# Patient Record
Sex: Male | Born: 1970 | Race: Black or African American | Hispanic: No | Marital: Married | Smoking: Never smoker
Health system: Southern US, Community
[De-identification: ages and names within clinical notes are randomized; demographics above are authoritative.]

## PROBLEM LIST (undated history)

## (undated) DIAGNOSIS — I10 Essential (primary) hypertension: Secondary | ICD-10-CM

## (undated) DIAGNOSIS — Z992 Dependence on renal dialysis: Secondary | ICD-10-CM

## (undated) DIAGNOSIS — I5020 Unspecified systolic (congestive) heart failure: Secondary | ICD-10-CM

## (undated) DIAGNOSIS — E119 Type 2 diabetes mellitus without complications: Secondary | ICD-10-CM

## (undated) DIAGNOSIS — N289 Disorder of kidney and ureter, unspecified: Secondary | ICD-10-CM

## (undated) DIAGNOSIS — I2510 Atherosclerotic heart disease of native coronary artery without angina pectoris: Secondary | ICD-10-CM

## (undated) DIAGNOSIS — E785 Hyperlipidemia, unspecified: Secondary | ICD-10-CM

## (undated) DIAGNOSIS — Z951 Presence of aortocoronary bypass graft: Secondary | ICD-10-CM

## (undated) DIAGNOSIS — N186 End stage renal disease: Secondary | ICD-10-CM

## (undated) HISTORY — PX: INSERTION OF DIALYSIS CATHETER: SHX1324

## (undated) HISTORY — PX: OTHER SURGICAL HISTORY: SHX169

## (undated) HISTORY — PX: AV FISTULA PLACEMENT: SHX1204

## (undated) HISTORY — PX: CARDIAC SURGERY: SHX584

---

## 2012-04-10 DIAGNOSIS — Z9889 Other specified postprocedural states: Secondary | ICD-10-CM

## 2012-04-10 HISTORY — PX: VITRECTOMY: SHX106

## 2012-04-10 HISTORY — DX: Other specified postprocedural states: Z98.890

## 2014-04-10 HISTORY — PX: CORONARY ARTERY BYPASS GRAFT: SHX141

## 2019-07-14 ENCOUNTER — Inpatient Hospital Stay (HOSPITAL_COMMUNITY)
Admission: EM | Admit: 2019-07-14 | Discharge: 2019-07-16 | DRG: 304 | Disposition: A | Discharge status: Home / Self Care | Payer: Medicare Other | Attending: Family Medicine | Admitting: Family Medicine

## 2019-07-14 ENCOUNTER — Emergency Department (HOSPITAL_COMMUNITY): Payer: Medicare Other

## 2019-07-14 ENCOUNTER — Other Ambulatory Visit: Payer: Self-pay

## 2019-07-14 ENCOUNTER — Encounter (HOSPITAL_COMMUNITY): Payer: Self-pay | Admitting: Emergency Medicine

## 2019-07-14 DIAGNOSIS — E785 Hyperlipidemia, unspecified: Secondary | ICD-10-CM | POA: Diagnosis present

## 2019-07-14 DIAGNOSIS — Z888 Allergy status to other drugs, medicaments and biological substances status: Secondary | ICD-10-CM | POA: Diagnosis not present

## 2019-07-14 DIAGNOSIS — Z79899 Other long term (current) drug therapy: Secondary | ICD-10-CM

## 2019-07-14 DIAGNOSIS — Z20822 Contact with and (suspected) exposure to covid-19: Secondary | ICD-10-CM | POA: Diagnosis present

## 2019-07-14 DIAGNOSIS — D631 Anemia in chronic kidney disease: Secondary | ICD-10-CM | POA: Diagnosis present

## 2019-07-14 DIAGNOSIS — I25110 Atherosclerotic heart disease of native coronary artery with unstable angina pectoris: Secondary | ICD-10-CM | POA: Diagnosis present

## 2019-07-14 DIAGNOSIS — Z794 Long term (current) use of insulin: Secondary | ICD-10-CM | POA: Diagnosis not present

## 2019-07-14 DIAGNOSIS — Z992 Dependence on renal dialysis: Secondary | ICD-10-CM | POA: Diagnosis not present

## 2019-07-14 DIAGNOSIS — I160 Hypertensive urgency: Secondary | ICD-10-CM | POA: Diagnosis present

## 2019-07-14 DIAGNOSIS — I120 Hypertensive chronic kidney disease with stage 5 chronic kidney disease or end stage renal disease: Secondary | ICD-10-CM | POA: Diagnosis present

## 2019-07-14 DIAGNOSIS — J9811 Atelectasis: Secondary | ICD-10-CM | POA: Diagnosis present

## 2019-07-14 DIAGNOSIS — E1122 Type 2 diabetes mellitus with diabetic chronic kidney disease: Secondary | ICD-10-CM | POA: Diagnosis present

## 2019-07-14 DIAGNOSIS — Z951 Presence of aortocoronary bypass graft: Secondary | ICD-10-CM

## 2019-07-14 DIAGNOSIS — N2581 Secondary hyperparathyroidism of renal origin: Secondary | ICD-10-CM | POA: Diagnosis present

## 2019-07-14 DIAGNOSIS — E8770 Fluid overload, unspecified: Secondary | ICD-10-CM | POA: Diagnosis present

## 2019-07-14 DIAGNOSIS — R778 Other specified abnormalities of plasma proteins: Secondary | ICD-10-CM | POA: Diagnosis present

## 2019-07-14 DIAGNOSIS — N186 End stage renal disease: Secondary | ICD-10-CM

## 2019-07-14 DIAGNOSIS — R079 Chest pain, unspecified: Secondary | ICD-10-CM

## 2019-07-14 DIAGNOSIS — R072 Precordial pain: Secondary | ICD-10-CM | POA: Diagnosis present

## 2019-07-14 HISTORY — DX: Disorder of kidney and ureter, unspecified: N28.9

## 2019-07-14 HISTORY — DX: Essential (primary) hypertension: I10

## 2019-07-14 HISTORY — DX: Type 2 diabetes mellitus without complications: E11.9

## 2019-07-14 LAB — PROTIME-INR
INR: 1.1 (ref 0.8–1.2)
Prothrombin Time: 14 seconds (ref 11.4–15.2)

## 2019-07-14 LAB — LIPID PANEL
Cholesterol: 134 mg/dL (ref 0–200)
HDL: 37 mg/dL — ABNORMAL LOW (ref >40)
LDL Cholesterol: 72 mg/dL (ref 0–99)
Total CHOL/HDL Ratio: 3.6 RATIO
Triglycerides: 127 mg/dL (ref <150)
VLDL: 25 mg/dL (ref 0–40)

## 2019-07-14 LAB — GLUCOSE, CAPILLARY: Glucose-Capillary: 133 mg/dL — ABNORMAL HIGH (ref 70–99)

## 2019-07-14 LAB — BASIC METABOLIC PANEL
Anion gap: 20 — ABNORMAL HIGH (ref 5–15)
BUN: 98 mg/dL — ABNORMAL HIGH (ref 6–20)
CO2: 22 mmol/L (ref 22–32)
Calcium: 8.6 mg/dL — ABNORMAL LOW (ref 8.9–10.3)
Chloride: 94 mmol/L — ABNORMAL LOW (ref 98–111)
Creatinine, Ser: 21.27 mg/dL — ABNORMAL HIGH (ref 0.61–1.24)
GFR calc Af Amer: 3 mL/min — ABNORMAL LOW (ref >60)
GFR calc non Af Amer: 2 mL/min — ABNORMAL LOW (ref >60)
Glucose, Bld: 112 mg/dL — ABNORMAL HIGH (ref 70–99)
Potassium: 4.2 mmol/L (ref 3.5–5.1)
Sodium: 136 mmol/L (ref 135–145)

## 2019-07-14 LAB — CBC
HCT: 29.9 % — ABNORMAL LOW (ref 39.0–52.0)
Hemoglobin: 9.9 g/dL — ABNORMAL LOW (ref 13.0–17.0)
MCH: 29.6 pg (ref 26.0–34.0)
MCHC: 33.1 g/dL (ref 30.0–36.0)
MCV: 89.3 fL (ref 80.0–100.0)
Platelets: 227 10*3/uL (ref 150–400)
RBC: 3.35 MIL/uL — ABNORMAL LOW (ref 4.22–5.81)
RDW: 16.2 % — ABNORMAL HIGH (ref 11.5–15.5)
WBC: 5.3 10*3/uL (ref 4.0–10.5)
nRBC: 0 % (ref 0.0–0.2)

## 2019-07-14 LAB — TROPONIN I (HIGH SENSITIVITY)
Troponin I (High Sensitivity): 113 ng/L (ref <18)
Troponin I (High Sensitivity): 98 ng/L — ABNORMAL HIGH (ref <18)

## 2019-07-14 LAB — RESPIRATORY PANEL BY RT PCR (FLU A&B, COVID)
Influenza A by PCR: NEGATIVE
Influenza B by PCR: NEGATIVE
SARS Coronavirus 2 by RT PCR: NEGATIVE

## 2019-07-14 MED ORDER — CINACALCET HCL 30 MG PO TABS
30.0000 mg | ORAL_TABLET | Freq: Every day | ORAL | Status: DC
  Administered 2019-07-15 – 2019-07-16 (×2): 30 mg via ORAL
  Filled 2019-07-14 (×3): qty 1

## 2019-07-14 MED ORDER — CARVEDILOL 12.5 MG PO TABS
12.5000 mg | ORAL_TABLET | Freq: Two times a day (BID) | ORAL | Status: DC
  Administered 2019-07-14 – 2019-07-16 (×4): 12.5 mg via ORAL
  Filled 2019-07-14 (×5): qty 1

## 2019-07-14 MED ORDER — NIFEDIPINE ER OSMOTIC RELEASE 60 MG PO TB24
60.0000 mg | ORAL_TABLET | Freq: Every day | ORAL | Status: DC
  Administered 2019-07-14 – 2019-07-16 (×3): 60 mg via ORAL
  Filled 2019-07-14 (×3): qty 1

## 2019-07-14 MED ORDER — FUROSEMIDE 80 MG PO TABS
80.0000 mg | ORAL_TABLET | Freq: Two times a day (BID) | ORAL | Status: DC
  Administered 2019-07-15 – 2019-07-16 (×3): 80 mg via ORAL
  Filled 2019-07-14 (×3): qty 1

## 2019-07-14 MED ORDER — SODIUM CHLORIDE 0.9% FLUSH: 3.0000 mL | Freq: Once | INTRAVENOUS | Status: DC

## 2019-07-14 MED ORDER — ROSUVASTATIN CALCIUM 20 MG PO TABS
40.0000 mg | ORAL_TABLET | Freq: Every day | ORAL | Status: DC
  Administered 2019-07-14 – 2019-07-16 (×3): 40 mg via ORAL
  Filled 2019-07-14 (×3): qty 2

## 2019-07-14 MED ORDER — INSULIN ASPART 100 UNIT/ML ~~LOC~~ SOLN: 0.0000 [IU] | Freq: Every day | SUBCUTANEOUS | Status: DC

## 2019-07-14 MED ORDER — ACETAMINOPHEN 325 MG PO TABS: 650.0000 mg | ORAL_TABLET | ORAL | Status: DC | PRN

## 2019-07-14 MED ORDER — SUCROFERRIC OXYHYDROXIDE 500 MG PO CHEW
500.0000 mg | CHEWABLE_TABLET | Freq: Three times a day (TID) | ORAL | Status: DC
  Filled 2019-07-14 (×2): qty 1

## 2019-07-14 MED ORDER — HEPARIN (PORCINE) 25000 UT/250ML-% IV SOLN
1600.0000 [IU]/h | INTRAVENOUS | Status: DC
  Administered 2019-07-14 – 2019-07-15 (×2): 1500 [IU]/h via INTRAVENOUS
  Administered 2019-07-16: 1600 [IU]/h via INTRAVENOUS
  Filled 2019-07-14 (×4): qty 250

## 2019-07-14 MED ORDER — SENNA 8.6 MG PO TABS
2.0000 | ORAL_TABLET | Freq: Every day | ORAL | Status: DC
  Administered 2019-07-15 – 2019-07-16 (×2): 17.2 mg via ORAL
  Filled 2019-07-14 (×2): qty 2

## 2019-07-14 MED ORDER — HEPARIN 1000 UNIT/ML FOR PERITONEAL DIALYSIS: INTRAPERITONEAL | Status: DC | PRN

## 2019-07-14 MED ORDER — EXTRANEAL 7.5 % IP SOLN
INTRAPERITONEAL | Status: DC
  Filled 2019-07-14 (×3): qty 2500

## 2019-07-14 MED ORDER — PARICALCITOL 1 MCG PO CAPS
3.0000 ug | ORAL_CAPSULE | Freq: Every day | ORAL | Status: DC
  Administered 2019-07-15 – 2019-07-16 (×2): 3 ug via ORAL
  Filled 2019-07-14 (×2): qty 3

## 2019-07-14 MED ORDER — LABETALOL HCL 5 MG/ML IV SOLN
10.0000 mg | Freq: Once | INTRAVENOUS | Status: AC
  Administered 2019-07-14: 20:00:00 10 mg via INTRAVENOUS
  Filled 2019-07-14: qty 4

## 2019-07-14 MED ORDER — HEPARIN BOLUS VIA INFUSION
4000.0000 [IU] | Freq: Once | INTRAVENOUS | Status: AC
  Administered 2019-07-14: 4000 [IU] via INTRAVENOUS
  Filled 2019-07-14: qty 4000

## 2019-07-14 MED ORDER — INSULIN ASPART 100 UNIT/ML ~~LOC~~ SOLN: 0.0000 [IU] | Freq: Three times a day (TID) | SUBCUTANEOUS | Status: DC

## 2019-07-14 MED ORDER — GENTAMICIN SULFATE 0.1 % EX CREA
1.0000 "application " | TOPICAL_CREAM | Freq: Every day | CUTANEOUS | Status: DC
  Administered 2019-07-16: 1 via TOPICAL
  Filled 2019-07-14: qty 15

## 2019-07-14 MED ORDER — ONDANSETRON HCL 4 MG/2ML IJ SOLN: 4.0000 mg | Freq: Four times a day (QID) | INTRAMUSCULAR | Status: DC | PRN

## 2019-07-14 MED ORDER — DELFLEX-LC/2.5% DEXTROSE 394 MOSM/L IP SOLN: INTRAPERITONEAL | Status: DC

## 2019-07-14 MED ORDER — HEPARIN 1000 UNIT/ML FOR PERITONEAL DIALYSIS: 500.0000 [IU] | INTRAMUSCULAR | Status: DC | PRN

## 2019-07-14 NOTE — Progress Notes (Signed)
Patient reportedly refused heparin drip be started at [facility] and continued refusing upon arrival to [place]. Consulting Cardiology MD Dr Simone Curia present on the unit  made aware  Patient finally agreeable to the infusion after talking to the physician. Elita Boone, ESN

## 2019-07-14 NOTE — Consult Note (Signed)
Reason for Consult: Continuity of ESRD care Referring Physician: Marva Panda, MD [facility])  HPI:  49 year old African-American man with past medical history significant for hypertension, type 2 diabetes mellitus, coronary artery disease status post CABG in 2016 and end-stage renal disease on peritoneal dialysis (CCPD).  He reports that he is in the process of moving from [place] to [place] and outpatient arrangements for continuity of peritoneal dialysis are underway.  He unfortunately misplaced/lost his medications in the moved here unfortunately has not taken any of his medications in the last 5 days.  Concern was raised 3 days ago when he  developed chest pain that has not resolved and is not associated diaphoresis, cough or sputum production.  He denies any nausea, vomiting or diarrhea.  Dialysis prescription (per patient): [facility]CPD, 2.6 L fill volume, 5 exchanges, 11 hours, Daytime dwell with icodextrin  Past Medical History:  Diagnosis Date  . Diabetes mellitus without complication ([place])   . Hypertension   . Renal disorder     Past Surgical History:  Procedure Laterality Date  . AV FISTULA PLACEMENT    . CARDIAC SURGERY    . CORONARY ARTERY BYPASS GRAFT  2016  . INSERTION OF DIALYSIS CATHETER      History reviewed. No pertinent family history.  Social History:  reports that he has never smoked. He has never used smokeless tobacco. He reports current alcohol use. No history on file for drug.  Allergies:  Allergies  Allergen Reactions  . Lisinopril Hives    Medications:  Scheduled: . carvedilol  12.5 mg Oral BID WC  . heparin  4,000 Units Intravenous Once  . NIFEdipine  60 mg Oral Daily  . rosuvastatin  40 mg Oral Daily  . sodium chloride flush  3 mL Intravenous Once   BMP Latest Ref Rng & Units 07/14/2019  Glucose 70 - 99 mg/dL 112(H)  BUN 6 - 20 mg/dL 98(H)  Creatinine 0.61 - 1.24 mg/dL 21.27(H)  Sodium 135 - 145 mmol/L 136  Potassium 3.5 - 5.1 mmol/L 4.2   Chloride 98 - 111 mmol/L 94(L)  CO2 22 - 32 mmol/L 22  Calcium 8.9 - 10.3 mg/dL 8.6(L)   CBC Latest Ref Rng & Units 07/14/2019  WBC 4.0 - 10.5 K/uL 5.3  Hemoglobin 13.0 - 17.0 g/dL 9.9(L)  Hematocrit 39.0 - 52.0 % 29.9(L)  Platelets 150 - 400 K/uL 227     DG Chest 2 View  Result Date: 07/14/2019 CLINICAL DATA:  Chest pain and shortness of breath EXAM: CHEST - 2 VIEW COMPARISON:  None. FINDINGS: There is mild cardiomegaly. Overlying median sternotomy wires are present. There is prominence of the central pulmonary vasculature. Hazy airspace opacity seen at the right lung base. No pleural effusion. No acute osseous abnormality. Overlying vascular stent seen within the region of the left subclavian. IMPRESSION: Cardiomegaly and pulmonary vascular congestion. Hazy airspace opacity at the right lung base which may be due to atelectasis, or asymmetric edema Electronically Signed   By: Prudencio Pair M.D.   On: 07/14/2019 15:37    Review of Systems Blood pressure (!) 227/125, pulse 87, temperature 98.6 F (37 C), temperature source Oral, resp. rate 16, height 5\' 10"  (1.778 m), weight 113.4 kg, SpO2 93 %. Physical Exam  Nursing note and vitals reviewed. Constitutional: He is oriented to person, place, and time. He appears well-developed and well-nourished. No distress.  HENT:  Head: Normocephalic and atraumatic.  Nose: Nose normal.  Mouth/Throat: Oropharynx is clear and moist.  Eyes: Pupils are equal,  round, and reactive to light. Conjunctivae and EOM are normal. No scleral icterus.  Neck: No JVD present.  Cardiovascular: Normal rate, regular rhythm and normal heart sounds.  No murmur heard. Respiratory: Effort normal and breath sounds normal. He has no wheezes. He has no rales.  GI: Soft. Bowel sounds are normal. There is no abdominal tenderness. There is no rebound and no guarding.  Peritoneal dialysis catheter in situ  Musculoskeletal:        General: No edema.     Cervical back: Normal  range of motion and neck supple.     Comments: LUA AVF  Neurological: He is alert and oriented to person, place, and time.  Skin: Skin is warm and dry. No rash noted. No erythema.   Assessment/Plan: 1.  Malignant hypertension: Resume oral antihypertensive therapy with as needed intravenous labetalol. 2.  Chest pain: Currently cycling cardiac enzymes to evaluate for acute coronary syndrome given background history of CABG.  Per cardiology, likely to undergo coronary angiography tomorrow. 3.  End-stage renal disease: We will resume peritoneal dialysis upon transfer to [facility].  Agree with restarting furosemide for volume management.  Dialysis navigator will need to verify his outpatient continuity of peritoneal dialysis. 4.  Anemia of chronic kidney disease: Without overt loss, borderline low hemoglobin and hematocrit.  Will check iron studies and resume ESA. 5.  Secondary hyperparathyroidism: Resume renal diet and phosphorus binders and reconcile medications for vitamin D receptor analog.   Lowell Mcgurk K. 07/14/2019, 8:03 PM

## 2019-07-14 NOTE — ED Notes (Signed)
carelink bedside

## 2019-07-14 NOTE — ED Notes (Signed)
Date and time results received: 07/14/19 4:47 PM  (use smartphrase ".now" to insert current time)  Test: Trop Critical Value: 113  Name of Provider Notified: [place]  Orders Received? Or Actions Taken?: Orders Received - See Orders for details

## 2019-07-14 NOTE — ED Triage Notes (Signed)
Pt reports that he has been moving boxes and furniture where he moved from [place] to [place]. Reports had chest discomfort and SOb over the past 3 days with exertion. hasnt had HTN meds in 3 days.

## 2019-07-14 NOTE — ED Notes (Signed)
Dr. Posey Pronto aware patients BP 202/110, new orders for labetalol 10 mg.

## 2019-07-14 NOTE — H&P (Addendum)
History and Physical        Hospital Admission Note Date: 07/14/2019  Patient name: Donald [name] Medical record number: [id number] Date of birth: 1970/09/11 Age: 49 y.o. Gender: male  PCP: Patient, [name]  Patient coming from: Home Lives with: Wife At baseline, ambulates: Independently  Chief Complaint    Chief Complaint  Patient presents with  . Chest Pain  . Shortness of Breath      HPI:   This is a 49 year old male with past medical history of CABG in 2016, ESRD on PD, type 2 diabetes, hypertension who presents to [facility] on 07/14/19 for persistent chest pain x3 to 4 days.  Patient recently moved from [place] to [place] and fortunately lost his medications in the move and so has been without his blood pressure meds x5 days about.  3 days ago started having typical chest pain symptoms which were not similar to his symptoms of CABG as he did not have chest pain at that time.  Notable ED labs: BUN/creatinine 98/21, calcium 8.6, anion gap 20, hemoglobin 9.9, HS troponin I 113 ED treatments: None ED imaging: CXR: Cardiomegaly and pulmonary vascular congestion, hazy airspace opacity at right lung base atelectasis or asymmetric edema ED vitals:  Vitals:   07/14/19 1715 07/14/19 1745  BP: (!) 212/110 (!) 223/129  Pulse: 82 84  Resp: (!) 23 (!) 25  Temp:    SpO2: (!) 89% (!) 86%     Review of Systems:  Review of Systems  Constitutional: Negative for chills and fever.  Eyes: Negative for blurred vision and double vision.  Respiratory: Positive for cough. Negative for shortness of breath.   Cardiovascular: Positive for chest pain. Negative for palpitations and leg swelling.  Gastrointestinal: Positive for nausea. Negative for vomiting.  Genitourinary: Negative.   Musculoskeletal: Negative.  Negative for myalgias.  Neurological: Negative for dizziness and  headaches.  Endo/Heme/Allergies: Negative.   All other systems reviewed and are negative.   Medical/Social/Family History   Past Medical History: Past Medical History:  Diagnosis Date  . Diabetes mellitus without complication ([place])   . Hypertension   . Renal disorder     Past Surgical History:  Procedure Laterality Date  . AV FISTULA PLACEMENT    . CARDIAC SURGERY    . CORONARY ARTERY BYPASS GRAFT  2016  . INSERTION OF DIALYSIS CATHETER      Medications: Prior to Admission medications   Medication Sig Start Date End Date Taking? Authorizing Provider  carvedilol (COREG) 12.5 MG tablet Take 12.5 mg by mouth 2 (two) times daily with a meal.   Yes [provider]  cinacalcet (SENSIPAR) 30 MG tablet Take 30 mg by mouth daily.   Yes [provider]  furosemide (LASIX) 80 MG tablet Take 80 mg by mouth 2 (two) times daily.   Yes [provider]  NIFEdipine (PROCARDIA XL/NIFEDICAL XL) 60 MG 24 hr tablet Take 60 mg by mouth in the morning and at bedtime.   Yes [provider]  paricalcitol (ZEMPLAR) 1 MCG capsule Take 3 mcg by mouth daily.   Yes [provider]  rosuvastatin (CRESTOR) 40 MG tablet Take 40 mg by mouth daily.   Yes [provider]  senna (SENOKOT) 8.6 MG tablet Take 2 tablets by mouth daily.   Yes [provider]  sucroferric oxyhydroxide (VELPHORO) 500 MG chewable tablet Chew 500 mg by mouth 3 (three) times daily with meals.   Yes [provider]    Allergies:   Allergies  Allergen Reactions  . Lisinopril Hives    Social History:  reports that he has never smoked. He has never used smokeless tobacco. He reports current alcohol use. No history on file for drug.  Family History: History reviewed. No pertinent family history.   Objective   Physical Exam: Blood pressure (!) 223/129, pulse 84, temperature 98.6 F (37 C), temperature source Oral, resp. rate (!) 25, SpO2 (!) 86 %.  Physical  Exam Vitals and nursing note reviewed.  Constitutional:      Comments: Appears uncomfortable  HENT:     Head: Normocephalic.  Cardiovascular:     Rate and Rhythm: Normal rate and regular rhythm.     Heart sounds: Normal heart sounds. No murmur.     Comments: Telemetry with ST depressions and occasional PVCs Pulmonary:     Effort: Pulmonary effort is normal. No tachypnea.     Breath sounds: No wheezing.  Abdominal:     Palpations: Abdomen is soft.     Tenderness: There is no abdominal tenderness.  Musculoskeletal:     Right lower leg: No tenderness.     Left lower leg: No tenderness.     Comments: Trace lower extremity edema bilaterally   Skin:    Comments: Chronic skin changes of bilateral lower extremities  Neurological:     General: No focal deficit present.     Mental Status: He is alert.  Psychiatric:        Mood and Affect: Mood normal.        Behavior: Behavior normal.     LABS on Admission: I have personally reviewed all the labs and imaging below    Basic Metabolic Panel: Recent Labs  Lab 07/14/19 1518  NA 136  K 4.2  CL 94*  CO2 22  GLUCOSE 112*  BUN 98*  CREATININE 21.27*  CALCIUM 8.6*   Liver Function Tests: No results for input(s): AST, ALT, ALKPHOS, BILITOT, PROT, ALBUMIN in the last 168 hours. No results for input(s): LIPASE, AMYLASE in the last 168 hours. No results for input(s): AMMONIA in the last 168 hours. CBC: Recent Labs  Lab 07/14/19 1518  WBC 5.3  HGB 9.9*  HCT 29.9*  MCV 89.3  PLT 227   Cardiac Enzymes: No results for input(s): CKTOTAL, CKMB, CKMBINDEX, TROPONINI in the last 168 hours. BNP: Invalid input(s): POCBNP CBG: No results for input(s): GLUCAP in the last 168 hours.  Radiological Exams on Admission:  DG Chest 2 View  Result Date: 07/14/2019 CLINICAL DATA:  Chest pain and shortness of breath EXAM: CHEST - 2 VIEW COMPARISON:  None. FINDINGS: There is mild cardiomegaly. Overlying median sternotomy wires are present.  There is prominence of the central pulmonary vasculature. Hazy airspace opacity seen at the right lung base. No pleural effusion. No acute osseous abnormality. Overlying vascular stent seen within the region of the left subclavian. IMPRESSION: Cardiomegaly and pulmonary vascular congestion. Hazy airspace opacity at the right lung base which may be due to atelectasis, or asymmetric edema Electronically Signed   By: Prudencio Pair M.D.   On: 07/14/2019 15:37      EKG: Independently reviewed.  ST depressions of lateral leads and QT prolongation   A &  P   Active Problems:   Hypertensive urgency   1. Hypertensive urgency a. Patient's home medications were lost in his recent move from [place] to [place] has been without meds x5 days b. Continue telemetry c. Restart home antihypertensives  2. Typical chest pain with history of CABG a. Initial troponin elevated at 113 in the setting of CKD b. Follow-up repeat troponin c. Continue telemetry d. Echo e. Restart home beta-blocker and statin f. Start on heparin per pharmacy g. Cardiology consulted: Recommended transfer to [facility] for likely cath tomorrow  3. ESRD on PD a. Consulted nephrology b. Restart home lasix c. Restarted home medications  4. Type 2 diabetes a. Stated he takes NovoLog 6 units 3 times daily with meals and was unsure of his long-acting dose b. Start on renal sliding scale c. Diabetic coordinator consult to assist with meds at discharge  5. Hyperlipidemia  a. Continue home statin b. Check lipid panel   DVT prophylaxis: Heparin   Code Status: Not on file  Diet: Renal/carb modified, n.p.o. after midnight Family Communication: Admission, patients condition and plan of care including tests being ordered have been discussed with the patient who indicates understanding and agrees with the plan and Code Status. Patient's wife at bedside was updated  Disposition Plan: The appropriate patient status for this patient is INPATIENT.  Inpatient status is judged to be reasonable and necessary in order to provide the required intensity of service to ensure the patient's safety. The patient's presenting symptoms, physical exam findings, and initial radiographic and laboratory data in the context of their chronic comorbidities is felt to place them at high risk for further clinical deterioration. Furthermore, it is not anticipated that the patient will be medically stable for discharge from the hospital within 2 midnights of admission. The following factors support the patient status of inpatient.   " The patient's presenting symptoms include chest pain. " The worrisome physical exam findings include hypertensive urgency, typical chest pain. " The initial radiographic and laboratory data are worrisome because of CXR with volume overload, elevated troponin, ST depressions on telemetry. " The chronic co-morbidities include CABG, ESRD on PD, hypertension, hyperlipidemia.   * I certify that at the point of admission it is my clinical judgment that the patient will require inpatient hospital care spanning beyond 2 midnights from the point of admission due to high intensity of service, high risk for further deterioration and high frequency of surveillance required.*    The medical decision making on this patient was of high complexity and the patient is at high risk for clinical deterioration, therefore this is a level 3 admission.  Consultants  . Nephrology . Cardiology  Procedures  . None  Time Spent on Admission: 75 minutes    Harold Hedge, DO [facility] Pager [phone] 07/14/2019, 6:05 PM

## 2019-07-14 NOTE — ED Notes (Signed)
Dr. Francia Greaves aware patients BP 200/100

## 2019-07-14 NOTE — ED Notes (Signed)
Patient informed RN that he could not take heparin, a previous MD of his care told him not to due to his eyes. Dr. Neysa Bonito made aware.

## 2019-07-14 NOTE — Consult Note (Signed)
[facility] Consult Note   Primary Physician:  Patient, [name] Primary Cardiologist:   None  Reason for Consultation:  Chest pain  HPI:    Donald [name] is a 49 year old male with a past medical history significant for coronary artery disease (s/p 3-vessel CABG in 2016), hypertension, ESRD (on peritoneal dialysis), and diabetes mellitus who presents to the hospital with complaints of chest pain. The chest pain is described as substernal, pressure-like, does not radiate, moderate in intensity and has been occurring intermittently for the past 3 days. He did not have any associated diaphoresis, nausea or vomiting.  He does admit that his pain is worse on exertion.  He recently moved to [place] from [place].  He has not established care locally. He also ran out of all of his medications recently. He has also had some associated shortness of breath.   In the emergency department his blood pressure was as high as 227/125 mmHg with a heart rate of 87 bpm.  Respiratory rate was 16 with an O2 saturation of 93%.  His labs were as follows: Potassium 4.2, BUN 98, creatinine 21.27, WBC 5.3, hemoglobin 9.9, hematocrit 29.9 and platelets 227.  The high-sensitivity troponins were 113 and 98. The chest x-ray showed pulmonary vascular congestion and mild cardiomegaly. The electrocardiogram showed normal sinus rhythm, ventricular rate 84 bpm, LVH with repolarization abnormalities and a QTC of 502 ms.    Home Medications Prior to Admission medications   Medication Sig Start Date End Date Taking? Authorizing Provider  carvedilol (COREG) 12.5 MG tablet Take 12.5 mg by mouth 2 (two) times daily with a meal.   Yes [provider]  cinacalcet (SENSIPAR) 30 MG tablet Take 30 mg by mouth daily.   Yes [provider]  furosemide (LASIX) 80 MG tablet Take 80 mg by mouth 2 (two) times daily.   Yes [provider]  NIFEdipine (PROCARDIA XL/NIFEDICAL XL) 60 MG 24 hr tablet  Take 60 mg by mouth in the morning and at bedtime.   Yes [provider]  paricalcitol (ZEMPLAR) 1 MCG capsule Take 3 mcg by mouth daily.   Yes [provider]  rosuvastatin (CRESTOR) 40 MG tablet Take 40 mg by mouth daily.   Yes [provider]  senna (SENOKOT) 8.6 MG tablet Take 2 tablets by mouth daily.   Yes [provider]  sucroferric oxyhydroxide (VELPHORO) 500 MG chewable tablet Chew 500 mg by mouth 3 (three) times daily with meals.   Yes [provider]    Past Medical History: Past Medical History:  Diagnosis Date  . Diabetes mellitus without complication ([place])   . Hypertension   . Renal disorder     Past Surgical History: Past Surgical History:  Procedure Laterality Date  . AV FISTULA PLACEMENT    . CARDIAC SURGERY    . CORONARY ARTERY BYPASS GRAFT  2016  . INSERTION OF DIALYSIS CATHETER      Family History: History reviewed. No pertinent family history.  Social History: Social History   Socioeconomic History  . Marital status: Married    Spouse name: Not on file  . Number of children: Not on file  . Years of education: Not on file  . Highest education level: Not on file  Occupational History  . Not on file  Tobacco Use  . Smoking status: Never Smoker  . Smokeless tobacco: Never Used  Substance and Sexual Activity  . Alcohol use: Yes    Comment: occasional   .  Drug use: Not on file  . Sexual activity: Not on file  Other Topics Concern  . Not on file  Social History Narrative  . Not on file   Social Determinants of Health   Financial Resource Strain:   . Difficulty of Paying Living Expenses:   Food Insecurity:   . Worried About Charity fundraiser in the Last Year:   . Arboriculturist in the Last Year:   Transportation Needs:   . Film/video editor (Medical):   [name] Lack of Transportation (Non-Medical):   Physical Activity:   . Days of Exercise per Week:   . Minutes of Exercise per Session:     Stress:   . Feeling of Stress :   Social Connections:   . Frequency of Communication with Friends and Family:   . Frequency of Social Gatherings with Friends and Family:   . Attends Religious Services:   . Active Member of Clubs or Organizations:   . Attends Archivist Meetings:   [name] Marital Status:     Allergies:  Allergies  Allergen Reactions  . Lisinopril Hives     Review of Systems: [y] = yes, [ ]  = no   . General: Weight gain [ ] ; Weight loss [ ] ; Anorexia [ ] ; Fatigue [ ] ; Fever [ ] ; Chills [ ] ; Weakness [ ]   . Cardiac: Chest pain/pressure [Y]; Resting SOB [ ] ; Exertional SOB [ ] ; Orthopnea [ ] ; Pedal Edema [ ] ; Palpitations [ ] ; Syncope [ ] ; Presyncope [ ] ; Paroxysmal nocturnal dyspnea[ ]   . Pulmonary: Cough [Y]; Wheezing[ ] ; Hemoptysis[ ] ; Sputum [ ] ; Snoring [ ]   . GI: Vomiting[ ] ; Dysphagia[ ] ; Melena[ ] ; Hematochezia [ ] ; Heartburn[ ] ; Abdominal pain [ ] ; Constipation [ ] ; Diarrhea [ ] ; BRBPR [ ]   . GU: Hematuria[ ] ; Dysuria [ ] ; Nocturia[ ]   . Vascular: Pain in legs with walking [ ] ; Pain in feet with lying flat [ ] ; Non-healing sores [ ] ; Stroke [ ] ; TIA [ ] ; Slurred speech [ ] ;  . Neuro: Headaches[ ] ; Vertigo[ ] ; Seizures[ ] ; Paresthesias[ ] ;Blurred vision [ ] ; Diplopia [ ] ; Vision changes [ ]   . Ortho/Skin: Arthritis [ ] ; Joint pain [ ] ; Muscle pain [ ] ; Joint swelling [ ] ; Back Pain [ ] ; Rash [ ]   . Psych: Depression[ ] ; Anxiety[ ]   . Heme: Bleeding problems [ ] ; Clotting disorders [ ] ; Anemia [ ]   . Endocrine: Diabetes [ ] ; Thyroid dysfunction[ ]      Objective:    Vital Signs:   Temp:  [98.4 F (36.9 C)-98.6 F (37 C)] 98.4 F (36.9 C) (04/05 2110) Pulse Rate:  [75-106] 75 (04/05 2110) Resp:  [16-31] 23 (04/05 2000) BP: (150-227)/(75-140) 150/75 (04/05 2110) SpO2:  [89 %-100 %] 100 % (04/05 2110) Weight:  [113.4 kg] 113.4 kg (04/05 1806)    Weight change: Filed Weights   07/14/19 1806  Weight: 113.4 kg    Intake/Output:  No intake or  output data in the 24 hours ending 07/14/19 2317    Physical Exam    General:  Well appearing. No resp difficulty HEENT: normal Neck: supple. JVP . Carotids 2+ bilat; no bruits. No lymphadenopathy or thyromegaly appreciated. Cor: PMI nondisplaced. Regular rate & rhythm. No rubs, gallops or murmurs. Lungs: clear to auscultation bilaterally Abdomen: soft, nontender, nondistended. No hepatosplenomegaly. No bruits or masses.  Extremities: no cyanosis, clubbing, rash, edema Neuro: alert & orientedx3, cranial nerves grossly intact. moves all 4  extremities w/o difficulty. Affect pleasant    EKG    The electrocardiogram (that I reviewed personally) showed normal sinus rhythm, ventricular rate 84 bpm, LVH with repolarization abnormalities and a QTC of 502 ms  Labs   Basic Metabolic Panel: Recent Labs  Lab 07/14/19 1518  NA 136  K 4.2  CL 94*  CO2 22  GLUCOSE 112*  BUN 98*  CREATININE 21.27*  CALCIUM 8.6*    Liver Function Tests: No results for input(s): AST, ALT, ALKPHOS, BILITOT, PROT, ALBUMIN in the last 168 hours. No results for input(s): LIPASE, AMYLASE in the last 168 hours. No results for input(s): AMMONIA in the last 168 hours.  CBC: Recent Labs  Lab 07/14/19 1518  WBC 5.3  HGB 9.9*  HCT 29.9*  MCV 89.3  PLT 227    Cardiac Enzymes: No results for input(s): CKTOTAL, CKMB, CKMBINDEX, TROPONINI in the last 168 hours.  BNP: BNP (last 3 results) No results for input(s): BNP in the last 8760 hours.  ProBNP (last 3 results) No results for input(s): PROBNP in the last 8760 hours.   CBG: Recent Labs  Lab 07/14/19 2222  GLUCAP 133*    Coagulation Studies: Recent Labs    07/14/19 1518  LABPROT 14.0  INR 1.1     Imaging   DG Chest 2 View  Result Date: 07/14/2019 CLINICAL DATA:  Chest pain and shortness of breath EXAM: CHEST - 2 VIEW COMPARISON:  None. FINDINGS: There is mild cardiomegaly. Overlying median sternotomy wires are present. There is  prominence of the central pulmonary vasculature. Hazy airspace opacity seen at the right lung base. No pleural effusion. No acute osseous abnormality. Overlying vascular stent seen within the region of the left subclavian. IMPRESSION: Cardiomegaly and pulmonary vascular congestion. Hazy airspace opacity at the right lung base which may be due to atelectasis, or asymmetric edema Electronically Signed   By: Prudencio Pair M.D.   On: 07/14/2019 15:37      Medications:     Current Medications: . carvedilol  12.5 mg Oral BID WC  . [START ON 07/15/2019] cinacalcet  30 mg Oral Q breakfast  . extraneal (ICODEXTRIN) peritoneal dialysis solution   Intraperitoneal Q24H  . [START ON 07/15/2019] furosemide  80 mg Oral BID  . [START ON 07/15/2019] gentamicin cream  1 application Topical Daily  . heparin  4,000 Units Intravenous Once  . insulin aspart  0-5 Units Subcutaneous QHS  . [START ON 07/15/2019] insulin aspart  0-6 Units Subcutaneous TID WC  . NIFEdipine  60 mg Oral Daily  . [START ON 07/15/2019] paricalcitol  3 mcg Oral Daily  . rosuvastatin  40 mg Oral Daily  . [START ON 07/15/2019] senna  2 tablet Oral Daily  . sodium chloride flush  3 mL Intravenous Once  . [START ON 07/15/2019] sucroferric oxyhydroxide  500 mg Oral TID WC     Infusions: . dialysis solution 2.5% low-MG/low-CA    . heparin Stopped (07/14/19 1840)       Assessment/Plan   1.  Chest pain / Unstable angina The patient has a history of coronary artery disease.  He presents with hypertensive urgency and complaints of chest pain.  His EKG shows LVH with repolarization abnormalities.  The high-sensitivity troponin shows a negative trend from 113 to 98.  The chest x-ray does suggest pulmonary vascular congestion. The elevation of the cardiac biomarkers might be secondary to demand ischemia due to the severely elevated blood pressures. He also has likely some volume overload probably  from inadequate peritoneal dialysis for his  ESRD.  -Continue to trend biomarkers -Serial ECGs -Check a BNP level -ASA 81 mg daily -Unfractionated IV heparin infusion (patient is agreeable to take it) -Optimize volume status with peritoneal dialysis -High dose statins -Check a lipid panel -Transthoracic echocardiogram to evaluate LV function  Will likely require an ischemic work up at some point as well.   2.  Hypertensive urgency  -Restart carvedilol 12.5 mg BID -Nifedipine 60mg  daily -Hydralazine PRN    Meade Maw, MD  07/14/2019, 11:17 PM  Cardiology Overnight Team Please contact [facility] Cardiology for night-coverage after hours (4p -7a ) and weekends on [email or web address]

## 2019-07-14 NOTE — ED Provider Notes (Signed)
[facility] DEPT Provider Note   CSN: [id number] Arrival date & time: 07/14/19  1435     History Chief Complaint  Patient presents with  . Chest Pain  . Shortness of Breath    Donald [name] is a 49 y.o. male.  49 year old male with prior medical history as detailed below presents for evaluation of chest discomfort.  Patient reports dull substernal chest pressure.  Patient reports onset of symptoms approximately 3 days prior.  He reports that symptoms are worse with exertion.  He reports that after rest the pressure resolves.  Patient denies prior history of bypass 2016.  He is currently on peritoneal dialysis.  He did recently moved to the area from [place].  He has not yet established local care.  He denies associated diaphoresis, nausea, vomiting, shortness of breath, or other specific complaint.  He denies known exposure to Covid.  He is currently comfortable and denies any active chest discomfort.  The history is provided by the patient and medical records.  Chest Pain Pain location:  Substernal area Pain quality: pressure   Pain radiates to:  Does not radiate Pain severity:  Moderate Onset quality:  Gradual Duration:  3 days Timing:  Constant Progression:  Waxing and waning Chronicity:  New Relieved by:  Nothing Worsened by:  Nothing      Past Medical History:  Diagnosis Date  . Diabetes mellitus without complication ([place])   . Hypertension   . Renal disorder     There are no problems to display for this patient.   Past Surgical History:  Procedure Laterality Date  . CARDIAC SURGERY         No family history on file.  Social History   Tobacco Use  . Smoking status: Never Smoker  . Smokeless tobacco: Never Used  Substance Use Topics  . Alcohol use: Yes    Comment: occasional   . Drug use: Not on file    Home Medications Prior to Admission medications   Medication Sig Start Date End Date Taking? Authorizing  Provider  carvedilol (COREG) 12.5 MG tablet Take 12.5 mg by mouth 2 (two) times daily with a meal.   Yes [provider]  cinacalcet (SENSIPAR) 30 MG tablet Take 30 mg by mouth daily.   Yes [provider]  furosemide (LASIX) 80 MG tablet Take 80 mg by mouth 2 (two) times daily.   Yes [provider]  NIFEdipine (PROCARDIA XL/NIFEDICAL XL) 60 MG 24 hr tablet Take 60 mg by mouth in the morning and at bedtime.   Yes [provider]  paricalcitol (ZEMPLAR) 1 MCG capsule Take 3 mcg by mouth daily.   Yes [provider]  senna (SENOKOT) 8.6 MG tablet Take 2 tablets by mouth daily.   Yes [provider]  sucroferric oxyhydroxide (VELPHORO) 500 MG chewable tablet Chew 500 mg by mouth 3 (three) times daily with meals.   Yes [provider]    Allergies    Lisinopril  Review of Systems   Review of Systems  All other systems reviewed and are negative.   Physical Exam Updated Vital Signs BP (!) 201/119 (BP Location: Right Arm)   Pulse 85   Temp 98.6 F (37 C) (Oral)   Resp 19   SpO2 99%   Physical Exam Vitals and nursing note reviewed.  Constitutional:      General: He is not in acute distress.    Appearance: He is well-developed.  HENT:  Head: Normocephalic and atraumatic.  Eyes:     Conjunctiva/sclera: Conjunctivae normal.     Pupils: Pupils are equal, round, and reactive to light.  Cardiovascular:     Rate and Rhythm: Normal rate and regular rhythm.     Heart sounds: Normal heart sounds.  Pulmonary:     Effort: Pulmonary effort is normal. No respiratory distress.     Breath sounds: Normal breath sounds.  Abdominal:     General: There is no distension.     Palpations: Abdomen is soft.     Tenderness: There is no abdominal tenderness.  Musculoskeletal:        General: No deformity. Normal range of motion.     Cervical back: Normal range of motion and neck supple.  Skin:    General: Skin is warm and dry.    Neurological:     General: No focal deficit present.     Mental Status: He is alert and oriented to person, place, and time.     Cranial Nerves: No cranial nerve deficit.     Motor: No weakness.     ED Results / Procedures / Treatments   Labs (all labs ordered are listed, but only abnormal results are displayed) Labs Reviewed  CBC - Abnormal; Notable for the following components:      Result Value   RBC 3.35 (*)    Hemoglobin 9.9 (*)    HCT 29.9 (*)    RDW 16.2 (*)    All other components within normal limits  RESPIRATORY PANEL BY RT PCR (FLU A&B, COVID)  BASIC METABOLIC PANEL  TROPONIN I (HIGH SENSITIVITY)    EKG EKG Interpretation  Date/Time:  Monday July 14 2019 14:46:32 EDT Ventricular Rate:  84 PR Interval:    QRS Duration: 87 QT Interval:  424 QTC Calculation: 502 R Axis:   58 Text Interpretation: Sinus rhythm Atrial premature complexes Probable left atrial enlargement Probable LVH with secondary repol abnrm Prolonged QT interval Confirmed by Dene Gentry [phone]) on 07/14/2019 2:58:58 PM   Radiology No results found.  Procedures Procedures (including critical care time)  Medications Ordered in ED Medications  sodium chloride flush (NS) 0.9 % injection 3 mL (has no administration in time range)    ED Course  I have reviewed the triage vital signs and the nursing notes.  Pertinent labs & imaging results that were available during my care of the patient were reviewed by me and considered in my medical decision making (see chart for details).    MDM Rules/Calculators/A&P                      MDM  Screen complete  Donald [name] was evaluated in Emergency Department on 07/14/2019 for the symptoms described in the history of present illness. He was evaluated in the context of the global COVID-19 pandemic, which necessitated consideration that the patient might be at risk for infection with the SARS-CoV-2 virus that causes COVID-19. Institutional protocols  and algorithms that pertain to the evaluation of patients at risk for COVID-19 are in a state of rapid change based on information released by regulatory bodies including the CDC and federal and state organizations. These policies and algorithms were followed during the patient's care in the ED.    Patient is presenting for evaluation of chest discomfort.   Patient with prior history of CAD and multiple other comorbidities.  He has not yet established local care after recent moved to the area.  Presentation is  concerning enough that he will require admission for further work-up and treatment.    Hospitalist service is aware of case and will evaluate for admission.     Final Clinical Impression(s) / ED Diagnoses Final diagnoses:  Chest pain, unspecified type    Rx / DC Orders ED Discharge Orders    None       Valarie Merino, MD 07/14/19 1736

## 2019-07-14 NOTE — Progress Notes (Signed)
ANTICOAGULATION CONSULT NOTE - Initial Consult  Pharmacy Consult for heparin  Indication: chest pain/ACS  Allergies  Allergen Reactions  . Lisinopril Hives    Patient Measurements:   Heparin Dosing Weight: 98kg  Vital Signs: Temp: 98.6 F (37 C) (04/05 1447) Temp Source: Oral (04/05 1447) BP: 212/110 (04/05 1715) Pulse Rate: 82 (04/05 1715)  Labs: Recent Labs    07/14/19 1518  HGB 9.9*  HCT 29.9*  PLT 227  CREATININE 21.27*  TROPONINIHS 113*    CrCl cannot be calculated (Unknown ideal weight.).   Medical History: Past Medical History:  Diagnosis Date  . Diabetes mellitus without complication ([place])   . Hypertension   . Renal disorder     Medications:  (Not in a hospital admission)  Scheduled:  . carvedilol  12.5 mg Oral BID WC  . NIFEdipine  60 mg Oral Daily  . rosuvastatin  40 mg Oral Daily  . sodium chloride flush  3 mL Intravenous Once   Infusions:    Assessment: Pt with a hx of ESRD on peritoneal dialysis presented to the ED with CP. IV heparin has been ordered to r/o MI with elevated troponin. He is not on anticoagulant prior to admission.   Goal of Therapy:  Heparin level 0.3-0.7 units/ml Monitor platelets by anticoagulation protocol: Yes   Plan:   Heparin bolus 4000 units Heparin infusion 1500 units/hr F/u with 8 hr heparin level Daily HL and CBC  Onnie Boer, PharmD, BCIDP, AAHIVP, CPP Infectious Disease Pharmacist 07/14/2019 6:11 PM

## 2019-07-15 ENCOUNTER — Other Ambulatory Visit (HOSPITAL_COMMUNITY): Payer: Medicare Other

## 2019-07-15 DIAGNOSIS — R778 Other specified abnormalities of plasma proteins: Secondary | ICD-10-CM

## 2019-07-15 DIAGNOSIS — R072 Precordial pain: Secondary | ICD-10-CM

## 2019-07-15 LAB — BASIC METABOLIC PANEL
Anion gap: 21 — ABNORMAL HIGH (ref 5–15)
BUN: 103 mg/dL — ABNORMAL HIGH (ref 6–20)
CO2: 21 mmol/L — ABNORMAL LOW (ref 22–32)
Calcium: 8.2 mg/dL — ABNORMAL LOW (ref 8.9–10.3)
Chloride: 93 mmol/L — ABNORMAL LOW (ref 98–111)
Creatinine, Ser: 21.72 mg/dL — ABNORMAL HIGH (ref 0.61–1.24)
GFR calc Af Amer: 3 mL/min — ABNORMAL LOW (ref >60)
GFR calc non Af Amer: 2 mL/min — ABNORMAL LOW (ref >60)
Glucose, Bld: 105 mg/dL — ABNORMAL HIGH (ref 70–99)
Potassium: 3.7 mmol/L (ref 3.5–5.1)
Sodium: 135 mmol/L (ref 135–145)

## 2019-07-15 LAB — CBC
HCT: 27.2 % — ABNORMAL LOW (ref 39.0–52.0)
Hemoglobin: 9.1 g/dL — ABNORMAL LOW (ref 13.0–17.0)
MCH: 29.6 pg (ref 26.0–34.0)
MCHC: 33.5 g/dL (ref 30.0–36.0)
MCV: 88.6 fL (ref 80.0–100.0)
Platelets: 220 10*3/uL (ref 150–400)
RBC: 3.07 MIL/uL — ABNORMAL LOW (ref 4.22–5.81)
RDW: 16.2 % — ABNORMAL HIGH (ref 11.5–15.5)
WBC: 5.6 10*3/uL (ref 4.0–10.5)
nRBC: 0 % (ref 0.0–0.2)

## 2019-07-15 LAB — HEPARIN LEVEL (UNFRACTIONATED)
Heparin Unfractionated: 0.48 IU/mL (ref 0.30–0.70)
Heparin Unfractionated: 0.35 IU/mL (ref 0.30–0.70)

## 2019-07-15 LAB — HEMOGLOBIN A1C
Hgb A1c MFr Bld: 6.1 % — ABNORMAL HIGH (ref 4.8–5.6)
Mean Plasma Glucose: 128.37 mg/dL

## 2019-07-15 LAB — GLUCOSE, CAPILLARY
Glucose-Capillary: 119 mg/dL — ABNORMAL HIGH (ref 70–99)
Glucose-Capillary: 95 mg/dL (ref 70–99)
Glucose-Capillary: 93 mg/dL (ref 70–99)
Glucose-Capillary: 199 mg/dL — ABNORMAL HIGH (ref 70–99)

## 2019-07-15 LAB — HIV ANTIBODY (ROUTINE TESTING W REFLEX): HIV Screen 4th Generation wRfx: NONREACTIVE

## 2019-07-15 LAB — PHOSPHORUS: Phosphorus: 9.5 mg/dL — ABNORMAL HIGH (ref 2.5–4.6)

## 2019-07-15 LAB — MAGNESIUM: Magnesium: 1.9 mg/dL (ref 1.7–2.4)

## 2019-07-15 MED ORDER — SUCROFERRIC OXYHYDROXIDE 500 MG PO CHEW
1000.0000 mg | CHEWABLE_TABLET | Freq: Three times a day (TID) | ORAL | Status: DC
  Administered 2019-07-15 – 2019-07-16 (×4): 1000 mg via ORAL
  Filled 2019-07-15 (×6): qty 2

## 2019-07-15 MED ORDER — ASPIRIN EC 81 MG PO TBEC
81.0000 mg | DELAYED_RELEASE_TABLET | Freq: Every day | ORAL | Status: DC
  Administered 2019-07-15 – 2019-07-16 (×2): 81 mg via ORAL
  Filled 2019-07-15 (×2): qty 1

## 2019-07-15 NOTE — Progress Notes (Signed)
Renal Navigator notified by Dr. Carolin Sicks that patient needs to be set up in [facility] for [facility], as he has just moved her from [place]. Navigator spoke with [facility] staff/G. Kirk Ruths, who states that a request for records was faxed to patient's Home Therapy RN/N[facility] in [place] on Friday, 07/11/19 when they learned of patient's move to [facility] and need for ongoing CCPD care and they have not yet received records. Phone number obtained for [facility]. Navigator was told that Dorris Fetch is off today and will be back tomorrow. Navigator will follow up tomorrow.  Alphonzo Cruise, [place] Renal Navigator [phone]

## 2019-07-15 NOTE — Progress Notes (Signed)
PROGRESS NOTE    Donald [name]  OZD:[id number] DOB: 01/25/1971 DOA: 07/14/2019 PCP: Patient, [name]   Brief Narrative:  49 year old with history of ESRD on PD, diabetes mellitus, type II, hypertension, who presented to [facility] long hospital with complaints of chest pain.  Patient was then transferred to [facility] for cardiology consultation and work-up.  Nephrology consulted for PD. Assessment & Plan   Hypertensive urgency -BP on arrival 227/125 -Without medications 5 days prior to admission.  His medications were supposedly lost his recent moved from [place] to [place] -BP currently stable, continue Coreg, Lasix, nifedipine  Chest pain, typical/evaded troponin -With history of CABG -High-sensitivity troponin I 13 on admission -Possibly due to demand ischemia from hypertension -Cardiology consulted and appreciated -Pending echocardiogram  -continue heparin  ESRD -On peritoneal dialysis -Nephrology consulted and appreciated  Diabetes mellitus, type II -Continue insulin sliding scale with CBG monitoring -Hemoglobin A1c 6.1  Hyperlipidemia -Continue statin - Lipid panel: total cholesterol 134, 7, LDL 72, triglycerides 127   DVT Prophylaxis Heparin  Code Status: Full  Family Communication: None at bedside  Disposition Plan: Patient admitted from home for chest pain.  Cardiology consulted and pending further recommendations.  Suspect disposition to home within 1 to 2 days.  Consultants Cardiology  Procedures  None  Antibiotics   Anti-infectives (From admission, onward)   None      Subjective:   Donald [name] seen and examined today.  Continues to have some chest pain in the center of his chest.  Currently denies any nausea or vomiting.  Denies breath, dizziness or headache.  Objective:   Vitals:   07/14/19 2110 07/15/19 0502 07/15/19 0825 07/15/19 1200  BP: (!) 150/75 (!) 150/78 (!) 147/75 (!) 144/75  Pulse: 75 77 78 71  Resp:  20 20    Temp: 98.4 F (36.9 C) 98.2 F (36.8 C) 98.3 F (36.8 C) 98.1 F (36.7 C)  TempSrc: Oral Oral Oral Oral  SpO2: 100%  95%   Weight:  113.4 kg    Height:        Intake/Output Summary (Last 24 hours) at 07/15/2019 1407 Last data filed at 07/15/2019 0645 Gross per 24 hour  Intake 147.43 ml  Output -  Net 147.43 ml   Filed Weights   07/14/19 1806 07/15/19 0502  Weight: 113.4 kg 113.4 kg    Exam  General: Well developed, well nourished, NAD, appears stated age  HEENT: NCAT, mucous membranes moist.   Cardiovascular: S1 S2 auscultated, no rubs, murmurs or gallops. Regular rate and rhythm.  Respiratory: Clear to auscultation bilaterally with equal chest rise  Abdomen: Soft, nontender, nondistended, + bowel sounds, PD cath  Extremities: warm dry without cyanosis clubbing or edema  Neuro: AAOx3, nonfocal  Psych: Normal affect and demeanor with intact judgement and insight   Data Reviewed: I have personally reviewed following labs and imaging studies  CBC: Recent Labs  Lab 07/14/19 1518 07/15/19 0346  WBC 5.3 5.6  HGB 9.9* 9.1*  HCT 29.9* 27.2*  MCV 89.3 88.6  PLT 227 259   Basic Metabolic Panel: Recent Labs  Lab 07/14/19 1518 07/15/19 0346  NA 136 135  K 4.2 3.7  CL 94* 93*  CO2 22 21*  GLUCOSE 112* 105*  BUN 98* 103*  CREATININE 21.27* 21.72*  CALCIUM 8.6* 8.2*  MG  --  1.9  PHOS  --  9.5*   GFR: Estimated Creatinine Clearance: 5.3 mL/min (A) (by C-G formula based on SCr of 21.72 mg/dL (  H)). Liver Function Tests: No results for input(s): AST, ALT, ALKPHOS, BILITOT, PROT, ALBUMIN in the last 168 hours. No results for input(s): LIPASE, AMYLASE in the last 168 hours. No results for input(s): AMMONIA in the last 168 hours. Coagulation Profile: Recent Labs  Lab 07/14/19 1518  INR 1.1   Cardiac Enzymes: No results for input(s): CKTOTAL, CKMB, CKMBINDEX, TROPONINI in the last 168 hours. BNP (last 3 results) No results for input(s): PROBNP in the last  8760 hours. HbA1C: Recent Labs    07/15/19 0346  HGBA1C 6.1*   CBG: Recent Labs  Lab 07/14/19 2222 07/15/19 0822 07/15/19 1128  GLUCAP 133* 119* 95   Lipid Profile: Recent Labs    07/14/19 2209  CHOL 134  HDL 37*  LDLCALC 72  TRIG 127  CHOLHDL 3.6   Thyroid Function Tests: No results for input(s): TSH, T4TOTAL, FREET4, T3FREE, THYROIDAB in the last 72 hours. Anemia Panel: No results for input(s): VITAMINB12, FOLATE, FERRITIN, TIBC, IRON, RETICCTPCT in the last 72 hours. Urine analysis: No results found for: COLORURINE, APPEARANCEUR, LABSPEC, PHURINE, GLUCOSEU, HGBUR, BILIRUBINUR, KETONESUR, PROTEINUR, UROBILINOGEN, NITRITE, LEUKOCYTESUR Sepsis Labs: @LABRCNTIP (procalcitonin:4,lacticidven:4)  ) Recent Results (from the past 240 hour(s))  Respiratory Panel by RT PCR (Flu A&B, Covid) - Nasopharyngeal Swab     Status: None   Collection Time: 07/14/19  3:22 PM   Specimen: Nasopharyngeal Swab  Result Value Ref Range Status   SARS Coronavirus 2 by RT PCR NEGATIVE NEGATIVE Final    Comment: (NOTE) SARS-CoV-2 target nucleic acids are NOT DETECTED. The SARS-CoV-2 RNA is generally detectable in upper respiratoy specimens during the acute phase of infection. The lowest concentration of SARS-CoV-2 viral copies this assay can detect is 131 copies/mL. A negative result does not preclude SARS-Cov-2 infection and should not be used as the sole basis for treatment or other patient management decisions. A negative result may occur with  improper specimen collection/handling, submission of specimen other than nasopharyngeal swab, presence of viral mutation(s) within the areas targeted by this assay, and inadequate number of viral copies (<131 copies/mL). A negative result must be combined with clinical observations, patient history, and epidemiological information. The expected result is Negative. Fact Sheet for Patients:  PinkCheek.be Fact Sheet for  Healthcare Providers:  GravelBags.it This test is not yet ap proved or cleared by the [place] FDA and  has been authorized for detection and/or diagnosis of SARS-CoV-2 by FDA under an Emergency Use Authorization (EUA). This EUA will remain  in effect (meaning this test can be used) for the duration of the COVID-19 declaration under Section 564(b)(1) of the Act, 21 U.S.C. section 360bbb-3(b)(1), unless the authorization is terminated or revoked sooner.    Influenza A by PCR NEGATIVE NEGATIVE Final   Influenza B by PCR NEGATIVE NEGATIVE Final    Comment: (NOTE) The Xpert Xpress SARS-CoV-2/FLU/RSV assay is intended as an aid in  the diagnosis of influenza from Nasopharyngeal swab specimens and  should not be used as a sole basis for treatment. Nasal washings and  aspirates are unacceptable for Xpert Xpress SARS-CoV-2/FLU/RSV  testing. Fact Sheet for Patients: PinkCheek.be Fact Sheet for Healthcare Providers: GravelBags.it This test is not yet approved or cleared by the [place] FDA and  has been authorized for detection and/or diagnosis of SARS-CoV-2 by  FDA under an Emergency Use Authorization (EUA). This EUA will remain  in effect (meaning this test can be used) for the duration of the  Covid-19 declaration under Section 564(b)(1) of the Act, 21  U.S.C. section 360bbb-3(b)(1), unless the authorization is  terminated or revoked. Performed at [facility], [place] [address], [place], [place] [address]       Radiology Studies: DG Chest 2 View  Result Date: 07/14/2019 CLINICAL DATA:  Chest pain and shortness of breath EXAM: CHEST - 2 VIEW COMPARISON:  None. FINDINGS: There is mild cardiomegaly. Overlying median sternotomy wires are present. There is prominence of the central pulmonary vasculature. Hazy airspace opacity seen at the right lung base. No pleural effusion. No  acute osseous abnormality. Overlying vascular stent seen within the region of the left subclavian. IMPRESSION: Cardiomegaly and pulmonary vascular congestion. Hazy airspace opacity at the right lung base which may be due to atelectasis, or asymmetric edema Electronically Signed   By: Prudencio Pair M.D.   On: 07/14/2019 15:37     Scheduled Meds: . aspirin EC  81 mg Oral Daily  . carvedilol  12.5 mg Oral BID WC  . cinacalcet  30 mg Oral Q breakfast  . extraneal (ICODEXTRIN) peritoneal dialysis solution   Intraperitoneal Q24H  . furosemide  80 mg Oral BID  . gentamicin cream  1 application Topical Daily  . insulin aspart  0-5 Units Subcutaneous QHS  . insulin aspart  0-6 Units Subcutaneous TID WC  . NIFEdipine  60 mg Oral Daily  . paricalcitol  3 mcg Oral Daily  . rosuvastatin  40 mg Oral Daily  . senna  2 tablet Oral Daily  . sodium chloride flush  3 mL Intravenous Once  . sucroferric oxyhydroxide  1,000 mg Oral TID WC   Continuous Infusions: . dialysis solution 2.5% low-MG/low-CA    . heparin 1,500 Units/hr (07/14/19 2332)     LOS: 1 day   Time Spent in minutes   30 minutes  Aubriella Perezgarcia D.O. on 07/15/2019 at 2:07 PM  Between 7am to 7pm - Please see pager noted on [email or web address]  After 7pm go to [email or web address]  And look for the night coverage person covering for me after hours  [facility] Office  [phone]

## 2019-07-15 NOTE — Progress Notes (Addendum)
Progress Note  Patient Name: Donald [name] Date of Encounter: 07/15/2019  Primary Cardiologist: No primary care provider on file.   Subjective   No chest pain this morning.   Inpatient Medications    Scheduled Meds: . carvedilol  12.5 mg Oral BID WC  . cinacalcet  30 mg Oral Q breakfast  . extraneal (ICODEXTRIN) peritoneal dialysis solution   Intraperitoneal Q24H  . furosemide  80 mg Oral BID  . gentamicin cream  1 application Topical Daily  . insulin aspart  0-5 Units Subcutaneous QHS  . insulin aspart  0-6 Units Subcutaneous TID WC  . NIFEdipine  60 mg Oral Daily  . paricalcitol  3 mcg Oral Daily  . rosuvastatin  40 mg Oral Daily  . senna  2 tablet Oral Daily  . sodium chloride flush  3 mL Intravenous Once  . sucroferric oxyhydroxide  500 mg Oral TID WC   Continuous Infusions: . dialysis solution 2.5% low-MG/low-CA    . heparin 1,500 Units/hr (07/14/19 2332)   PRN Meds: acetaminophen, dianeal solution for CAPD/CCPD with heparin, ondansetron (ZOFRAN) IV   Vital Signs    Vitals:   07/14/19 2000 07/14/19 2110 07/15/19 0502 07/15/19 0825  BP: (!) 212/117 (!) 150/75 (!) 150/78 (!) 147/75  Pulse: 77 75 77 78  Resp: (!) 23  20 20   Temp:  98.4 F (36.9 C) 98.2 F (36.8 C) 98.3 F (36.8 C)  TempSrc:  Oral Oral Oral  SpO2: 100% 100%  95%  Weight:   113.4 kg   Height:        Intake/Output Summary (Last 24 hours) at 07/15/2019 1028 Last data filed at 07/15/2019 0645 Gross per 24 hour  Intake 147.43 ml  Output -  Net 147.43 ml   Last 3 Weights 07/15/2019 07/14/2019  Weight (lbs) 250 lb 250 lb  Weight (kg) 113.399 kg 113.399 kg      Telemetry    SR - Personally Reviewed  ECG    SR with PACs, LVH, atrial enlargement - Personally Reviewed  Physical Exam  Pleasant AAM, laying in bed GEN: No acute distress.   Neck: No JVD Cardiac: RRR, no murmurs, rubs, or gallops.  Respiratory: Clear to auscultation bilaterally. GI: Soft, nontender, non-distended  MS: No  edema; No deformity. Neuro:  Nonfocal  Psych: Normal affect   Labs    High Sensitivity Troponin:   Recent Labs  Lab 07/14/19 1518 07/14/19 1730  TROPONINIHS 113* 98*      Chemistry Recent Labs  Lab 07/14/19 1518 07/15/19 0346  NA 136 135  K 4.2 3.7  CL 94* 93*  CO2 22 21*  GLUCOSE 112* 105*  BUN 98* 103*  CREATININE 21.27* 21.72*  CALCIUM 8.6* 8.2*  GFRNONAA 2* 2*  GFRAA 3* 3*  ANIONGAP 20* 21*     Hematology Recent Labs  Lab 07/14/19 1518 07/15/19 0346  WBC 5.3 5.6  RBC 3.35* 3.07*  HGB 9.9* 9.1*  HCT 29.9* 27.2*  MCV 89.3 88.6  MCH 29.6 29.6  MCHC 33.1 33.5  RDW 16.2* 16.2*  PLT 227 220    BNPNo results for input(s): BNP, PROBNP in the last 168 hours.   DDimer No results for input(s): DDIMER in the last 168 hours.   Radiology    DG Chest 2 View  Result Date: 07/14/2019 CLINICAL DATA:  Chest pain and shortness of breath EXAM: CHEST - 2 VIEW COMPARISON:  None. FINDINGS: There is mild cardiomegaly. Overlying median sternotomy wires are present. There is prominence  of the central pulmonary vasculature. Hazy airspace opacity seen at the right lung base. No pleural effusion. No acute osseous abnormality. Overlying vascular stent seen within the region of the left subclavian. IMPRESSION: Cardiomegaly and pulmonary vascular congestion. Hazy airspace opacity at the right lung base which may be due to atelectasis, or asymmetric edema Electronically Signed   By: Prudencio Pair M.D.   On: 07/14/2019 15:37    Cardiac Studies     Patient Profile     49 y.o. male with PMH of CAD s/p CABg ('16), HTN, HL, ESRD on PD, and DM who presented with chest pain in the setting of severely elevated blood pressures. Had been out of his medications for about a week.   Assessment & Plan    1. Chest pain/elevated troponin: does have hx of CAD with CABG in 2016 per his report. Blood pressures were significantly elevated on admission. hsTn 113>>98. He was started on IV heparin. Now  with improvement in BP he feels much better. Troponin could be demand from blood pressures.  -- will plan to obtain records from [place] as he reports undergoing transplant work up with echo and stress test back in March.  -- continue IV heparin for now -- echo pending  2. ESRD on PD: started about a year ago. Seen by nephrology.   3. HTN: reports being out all his meds for about a week since moving. Much improved with the resumption of therapy.   4. HL: back on statin. LDL 72  5. DM: Hgb A1c 6.1 -- SSI   For questions or updates, please contact [place] Please consult [email or web address] for contact info under   Signed, Reino Bellis, NP  07/15/2019, 10:28 AM    I have personally seen and examined this patient. I agree with the assessment and plan as outlined above.  Chest pain resolved this am with good blood pressure control. He had been out of his medications for a week. I suspect that his pain was due to his hypertensive crisis. Flat troponin not suggestive of ACS. Agree with IV heparin for now. If his echo shows stable LV function, can stop heparin. Will try to get records of recent cardiac workup in [place]. Continue current anti-hypertensive therapy.   Lauree Chandler 07/15/2019 10:52 AM

## 2019-07-15 NOTE — Progress Notes (Signed)
Inpatient Diabetes Program Recommendations  AACE/ADA: New Consensus Statement on Inpatient Glycemic Control (2015)  Target Ranges:  Prepandial:   less than 140 mg/dL      Peak postprandial:   less than 180 mg/dL (1-2 hours)      Critically ill patients:  140 - 180 mg/dL   Lab Results  Component Value Date   GLUCAP 95 07/15/2019   HGBA1C 6.1 (H) 07/15/2019    Review of Glycemic Control Results for Donald [name], Donald [name] (MRN [id number]) as of 07/15/2019 15:40  Ref. Range 07/14/2019 22:22 07/15/2019 08:22 07/15/2019 11:28  Glucose-Capillary Latest Ref Range: 70 - 99 mg/dL 133 (H) 119 (H) 95    Diabetes history: DM2 Outpatient Diabetes medications: Novolog 6 units TID with meals  + Levemir daily (cannot remember dose?) Current orders for Inpatient glycemic control: Novolog 0-6 TID with meals + 0-5 units at bedtime  Note:  Patient admitted with chest pain.  Diabetes consult placed for supplies and new to area.   History of DM2.  Takes above home medications.  A1C 6.1%.  He states it usually is around 7%.  He recently moved from [place] to be closer to his son.  Has plenty of insulin and supplies currently but will need a PCP.  TOC consult has been placed for assistance.  He checks his blood sugar one a day.  Usually reads 115-140 mg/dl.   He does not experience low blood sugars but would treat with "something sweet" if he did.  No further questions or concerns at this time.  Will continue to follow while inpatient.    Thank you, Reche Dixon, RN, BSN Diabetes Coordinator Inpatient Diabetes Program [phone] (team pager from 8a-5p)

## 2019-07-15 NOTE — Progress Notes (Signed)
ANTICOAGULATION CONSULT NOTE - Follow Up Consult  Pharmacy Consult for Heparin Indication: chest pain/ACS  Allergies  Allergen Reactions  . Lisinopril Hives    Patient Measurements: Height: 5\' 10"  (177.8 cm) Weight: 113.4 kg (250 lb) IBW/kg (Calculated) : 73 Heparin Dosing Weight: 98 kg  Vital Signs: Temp: 98 F (36.7 C) (04/06 1504) Temp Source: Oral (04/06 1504) BP: 138/67 (04/06 1504) Pulse Rate: 69 (04/06 1504)  Labs: Recent Labs    07/14/19 1518 07/14/19 1730 07/15/19 0346 07/15/19 0715 07/15/19 1438  HGB 9.9*  --  9.1*  --   --   HCT 29.9*  --  27.2*  --   --   PLT 227  --  220  --   --   LABPROT 14.0  --   --   --   --   INR 1.1  --   --   --   --   HEPARINUNFRC  --   --   --  0.48 0.35  CREATININE 21.27*  --  21.72*  --   --   TROPONINIHS 113* 98*  --   --   --     Estimated Creatinine Clearance: 5.3 mL/min (A) (by C-G formula based on SCr of 21.72 mg/dL (H)).   Medications:  Infusions:  . dialysis solution 2.5% low-MG/low-CA    . heparin 1,500 Units/hr (07/15/19 1453)    Assessment: 49 year old male on IV Heparin per pharmacy dosing protocol for acute coronary syndrome/ chest pain.   Heparin level is therapeutic for goal at 0.35 (down trended from last level as prior level likely indicated bolus effect) on rate of 1500 units/hr. Hgb is low but stable. Platelets are stable. No bleeding is reported.   Goal of Therapy:  Heparin level 0.3-0.7 units/ml Monitor platelets by anticoagulation protocol: Yes   Plan:  Increase heparin to 1600 units/hr to keep in range. Daily Heparin level and CBC while on therapy.   Sloan Leiter, PharmD, BCPS, BCCCP Clinical Pharmacist Please refer to [facility] for [place] numbers 07/15/2019,4:19 PM

## 2019-07-15 NOTE — Progress Notes (Signed)
ANTICOAGULATION CONSULT NOTE - Initial Consult  Pharmacy Consult for heparin  Indication: chest pain/ACS  Allergies  Allergen Reactions  . Lisinopril Hives    Patient Measurements: Height: 5\' 10"  (177.8 cm) Weight: 113.4 kg (250 lb) IBW/kg (Calculated) : 73 Heparin Dosing Weight: 98kg  Vital Signs: Temp: 98.3 F (36.8 C) (04/06 0825) Temp Source: Oral (04/06 0825) BP: 147/75 (04/06 0825) Pulse Rate: 78 (04/06 0825)  Labs: Recent Labs    07/14/19 1518 07/14/19 1730 07/15/19 0346 07/15/19 0715  HGB 9.9*  --  9.1*  --   HCT 29.9*  --  27.2*  --   PLT 227  --  220  --   LABPROT 14.0  --   --   --   INR 1.1  --   --   --   HEPARINUNFRC  --   --   --  0.48  CREATININE 21.27*  --  21.72*  --   TROPONINIHS 113* 98*  --   --     Estimated Creatinine Clearance: 5.3 mL/min (A) (by C-G formula based on SCr of 21.72 mg/dL (H)).   Medical History: Past Medical History:  Diagnosis Date  . Diabetes mellitus without complication ([place])   . Hypertension   . Renal disorder     Medications:  Medications Prior to Admission  Medication Sig Dispense Refill Last Dose  . carvedilol (COREG) 12.5 MG tablet Take 12.5 mg by mouth 2 (two) times daily with a meal.   Past Week at Unknown time  . cinacalcet (SENSIPAR) 30 MG tablet Take 30 mg by mouth daily.   Past Week at Unknown time  . furosemide (LASIX) 80 MG tablet Take 80 mg by mouth 2 (two) times daily.   Past Week at Unknown time  . NIFEdipine (PROCARDIA XL/NIFEDICAL XL) 60 MG 24 hr tablet Take 60 mg by mouth in the morning and at bedtime.   Past Week at Unknown time  . paricalcitol (ZEMPLAR) 1 MCG capsule Take 3 mcg by mouth daily.   Past Week at Unknown time  . rosuvastatin (CRESTOR) 40 MG tablet Take 40 mg by mouth daily.   Past Week at Unknown time  . senna (SENOKOT) 8.6 MG tablet Take 2 tablets by mouth daily.   Past Week at Unknown time  . sucroferric oxyhydroxide (VELPHORO) 500 MG chewable tablet Chew 500 mg by mouth 3  (three) times daily with meals.   Past Week at Unknown time   Scheduled:  . carvedilol  12.5 mg Oral BID WC  . cinacalcet  30 mg Oral Q breakfast  . extraneal (ICODEXTRIN) peritoneal dialysis solution   Intraperitoneal Q24H  . furosemide  80 mg Oral BID  . gentamicin cream  1 application Topical Daily  . insulin aspart  0-5 Units Subcutaneous QHS  . insulin aspart  0-6 Units Subcutaneous TID WC  . NIFEdipine  60 mg Oral Daily  . paricalcitol  3 mcg Oral Daily  . rosuvastatin  40 mg Oral Daily  . senna  2 tablet Oral Daily  . sodium chloride flush  3 mL Intravenous Once  . sucroferric oxyhydroxide  500 mg Oral TID WC   Infusions:  . dialysis solution 2.5% low-MG/low-CA    . heparin 1,500 Units/hr (07/14/19 2332)    Assessment: Pt with a hx of ESRD on peritoneal dialysis presented to the ED with CP. IV heparin has been ordered to r/o MI with elevated troponin. He is not on anticoagulant prior to admission.   4/6: Heparin  level 0.48 (therapeutic) on heparin gtt 1500 units/hr. Hbg 9.1 (low but stable), plt wnl. No bleeding concerns noted.   Goal of Therapy:  Heparin level 0.3-0.7 units/ml Monitor platelets by anticoagulation protocol: Yes   Plan:  Continue Heparin infusion 1500 units/hr Obtain confirmatory 8 hr heparin level Monitor daily HL, CBC, and s/sx of bleeding   Acey Lav, PharmD  PGY1 Acute Care Pharmacy Resident 07/15/2019 8:32 AM

## 2019-07-15 NOTE — Plan of Care (Signed)
  Problem: Activity: Goal: Ability to tolerate increased activity will improve Outcome: Progressing   Problem: Cardiac: Goal: Ability to achieve and maintain adequate cardiovascular perfusion will improve Outcome: Progressing   Problem: Education: Goal: Knowledge of General Education information will improve Description: Including pain rating scale, medication(s)/side effects and non-pharmacologic comfort measures Outcome: Progressing   Problem: Clinical Measurements: Goal: Ability to maintain clinical measurements within normal limits will improve Outcome: Progressing   Problem: Pain Managment: Goal: General experience of comfort will improve Outcome: Progressing

## 2019-07-15 NOTE — Progress Notes (Signed)
[facility] NEPHROLOGY PROGRESS NOTE  Assessment/ Plan: Pt is a 49 y.o. yo male with history of HTN, DM, CAD status post CABG, ESRD on PD now in the process of moving from [place] to [place], admitted with hypertensive urgency.  #Malignant hypertension: BP improved after resuming home medication.  UF during PD as tolerated.  #Chest pain/elevated troponin in a patient with history of CAD: Currently on IV heparin, echo pending, cardiology following.  # ESRD on PD: Resume peritoneal dialysis tonight.  PD prescription as ordered.  Discussed with the social worker to arrange for outpatient PD in [place].  # Anemia of CKD: Check iron studies.  He will need ESA.  # Secondary hyperparathyroidism: Increase the dose of valproate to 1000 mg.  Continue vitamin D and Sensipar.  Check PTH level.  Subjective: Seen and examined at bedside.  Denies nausea vomiting chest pain shortness of breath.  No new event.  Blood pressure is better controlled. Objective Vital signs in last 24 hours: Vitals:   07/14/19 2000 07/14/19 2110 07/15/19 0502 07/15/19 0825  BP: (!) 212/117 (!) 150/75 (!) 150/78 (!) 147/75  Pulse: 77 75 77 78  Resp: (!) 23  20 20   Temp:  98.4 F (36.9 C) 98.2 F (36.8 C) 98.3 F (36.8 C)  TempSrc:  Oral Oral Oral  SpO2: 100% 100%  95%  Weight:   113.4 kg   Height:       Weight change:   Intake/Output Summary (Last 24 hours) at 07/15/2019 1050 Last data filed at 07/15/2019 0645 Gross per 24 hour  Intake 147.43 ml  Output --  Net 147.43 ml       Labs: Basic Metabolic Panel: Recent Labs  Lab 07/14/19 1518 07/15/19 0346  NA 136 135  K 4.2 3.7  CL 94* 93*  CO2 22 21*  GLUCOSE 112* 105*  BUN 98* 103*  CREATININE 21.27* 21.72*  CALCIUM 8.6* 8.2*  PHOS  --  9.5*   Liver Function Tests: No results for input(s): AST, ALT, ALKPHOS, BILITOT, PROT, ALBUMIN in the last 168 hours. No results for input(s): LIPASE, AMYLASE in the last 168 hours. No results for  input(s): AMMONIA in the last 168 hours. CBC: Recent Labs  Lab 07/14/19 1518 07/15/19 0346  WBC 5.3 5.6  HGB 9.9* 9.1*  HCT 29.9* 27.2*  MCV 89.3 88.6  PLT 227 220   Cardiac Enzymes: No results for input(s): CKTOTAL, CKMB, CKMBINDEX, TROPONINI in the last 168 hours. CBG: Recent Labs  Lab 07/14/19 2222 07/15/19 0822  GLUCAP 133* 119*    Iron Studies: No results for input(s): IRON, TIBC, TRANSFERRIN, FERRITIN in the last 72 hours. Studies/Results: DG Chest 2 View  Result Date: 07/14/2019 CLINICAL DATA:  Chest pain and shortness of breath EXAM: CHEST - 2 VIEW COMPARISON:  None. FINDINGS: There is mild cardiomegaly. Overlying median sternotomy wires are present. There is prominence of the central pulmonary vasculature. Hazy airspace opacity seen at the right lung base. No pleural effusion. No acute osseous abnormality. Overlying vascular stent seen within the region of the left subclavian. IMPRESSION: Cardiomegaly and pulmonary vascular congestion. Hazy airspace opacity at the right lung base which may be due to atelectasis, or asymmetric edema Electronically Signed   By: Prudencio Pair M.D.   On: 07/14/2019 15:37    Medications: Infusions: . dialysis solution 2.5% low-MG/low-CA    . heparin 1,500 Units/hr (07/14/19 2332)    Scheduled Medications: . aspirin EC  81 mg Oral Daily  . carvedilol  12.5 mg Oral BID WC  . cinacalcet  30 mg Oral Q breakfast  . extraneal (ICODEXTRIN) peritoneal dialysis solution   Intraperitoneal Q24H  . furosemide  80 mg Oral BID  . gentamicin cream  1 application Topical Daily  . insulin aspart  0-5 Units Subcutaneous QHS  . insulin aspart  0-6 Units Subcutaneous TID WC  . NIFEdipine  60 mg Oral Daily  . paricalcitol  3 mcg Oral Daily  . rosuvastatin  40 mg Oral Daily  . senna  2 tablet Oral Daily  . sodium chloride flush  3 mL Intravenous Once  . sucroferric oxyhydroxide  500 mg Oral TID WC    have reviewed scheduled and prn  medications.  Physical Exam: General:NAD, comfortable Heart:RRR, s1s2 nl Lungs:clear b/l, no crackle Abdomen:soft, Non-tender, non-distended Extremities:No LE edema Dialysis Access: PD catheter site clean.  Danijela Vessey Prasad Darrek Leasure 07/15/2019,10:50 AM  LOS: 1 day  Pager: [phone]

## 2019-07-16 ENCOUNTER — Inpatient Hospital Stay (HOSPITAL_COMMUNITY): Payer: Medicare Other

## 2019-07-16 DIAGNOSIS — R079 Chest pain, unspecified: Secondary | ICD-10-CM

## 2019-07-16 LAB — GLUCOSE, CAPILLARY
Glucose-Capillary: 118 mg/dL — ABNORMAL HIGH (ref 70–99)
Glucose-Capillary: 126 mg/dL — ABNORMAL HIGH (ref 70–99)

## 2019-07-16 LAB — CBC
HCT: 26 % — ABNORMAL LOW (ref 39.0–52.0)
Hemoglobin: 8.6 g/dL — ABNORMAL LOW (ref 13.0–17.0)
MCH: 29.3 pg (ref 26.0–34.0)
MCHC: 33.1 g/dL (ref 30.0–36.0)
MCV: 88.4 fL (ref 80.0–100.0)
Platelets: 206 10*3/uL (ref 150–400)
RBC: 2.94 MIL/uL — ABNORMAL LOW (ref 4.22–5.81)
RDW: 16.1 % — ABNORMAL HIGH (ref 11.5–15.5)
WBC: 4.5 10*3/uL (ref 4.0–10.5)
nRBC: 0 % (ref 0.0–0.2)

## 2019-07-16 LAB — RENAL FUNCTION PANEL
Albumin: 2.9 g/dL — ABNORMAL LOW (ref 3.5–5.0)
Anion gap: 20 — ABNORMAL HIGH (ref 5–15)
BUN: 103 mg/dL — ABNORMAL HIGH (ref 6–20)
CO2: 22 mmol/L (ref 22–32)
Calcium: 8 mg/dL — ABNORMAL LOW (ref 8.9–10.3)
Chloride: 91 mmol/L — ABNORMAL LOW (ref 98–111)
Creatinine, Ser: 21.64 mg/dL — ABNORMAL HIGH (ref 0.61–1.24)
GFR calc Af Amer: 3 mL/min — ABNORMAL LOW (ref >60)
GFR calc non Af Amer: 2 mL/min — ABNORMAL LOW (ref >60)
Glucose, Bld: 154 mg/dL — ABNORMAL HIGH (ref 70–99)
Phosphorus: 9 mg/dL — ABNORMAL HIGH (ref 2.5–4.6)
Potassium: 3.5 mmol/L (ref 3.5–5.1)
Sodium: 133 mmol/L — ABNORMAL LOW (ref 135–145)

## 2019-07-16 LAB — IRON AND TIBC
Iron: 46 ug/dL (ref 45–182)
Saturation Ratios: 19 % (ref 17.9–39.5)
TIBC: 239 ug/dL — ABNORMAL LOW (ref 250–450)
UIBC: 193 ug/dL

## 2019-07-16 LAB — ECHOCARDIOGRAM COMPLETE
Height: 70 in
Weight: 3954.17 oz

## 2019-07-16 LAB — HEPARIN LEVEL (UNFRACTIONATED): Heparin Unfractionated: 0.39 IU/mL (ref 0.30–0.70)

## 2019-07-16 LAB — HEPATITIS B SURFACE ANTIGEN: Hepatitis B Surface Ag: NONREACTIVE

## 2019-07-16 LAB — FERRITIN: Ferritin: 434 ng/mL — ABNORMAL HIGH (ref 24–336)

## 2019-07-16 MED ORDER — SENNOSIDES 8.6 MG PO TABS: 2.0000 | ORAL_TABLET | Freq: Every day | ORAL | 3 refills | Status: DC

## 2019-07-16 MED ORDER — FUROSEMIDE 80 MG PO TABS: 80.0000 mg | ORAL_TABLET | Freq: Two times a day (BID) | ORAL | 3 refills | Status: DC

## 2019-07-16 MED ORDER — INSULIN PEN NEEDLE 29G X 10MM MISC: 1.0000 | Freq: Three times a day (TID) | 3 refills | Status: DC

## 2019-07-16 MED ORDER — VELPHORO 500 MG PO CHEW: 500.0000 mg | CHEWABLE_TABLET | Freq: Three times a day (TID) | ORAL | 3 refills | Status: DC

## 2019-07-16 MED ORDER — SODIUM CHLORIDE 0.9 % IV SOLN
250.0000 mg | Freq: Every day | INTRAVENOUS | Status: DC
  Administered 2019-07-16: 250 mg via INTRAVENOUS
  Filled 2019-07-16: qty 20

## 2019-07-16 MED ORDER — CINACALCET HCL 30 MG PO TABS: 30.0000 mg | ORAL_TABLET | Freq: Every day | ORAL | 3 refills | Status: DC

## 2019-07-16 MED ORDER — PARICALCITOL 1 MCG PO CAPS: 3.0000 ug | ORAL_CAPSULE | Freq: Every day | ORAL | 3 refills | Status: DC

## 2019-07-16 MED ORDER — NIFEDIPINE ER OSMOTIC RELEASE 60 MG PO TB24: 60.0000 mg | ORAL_TABLET | Freq: Two times a day (BID) | ORAL | 3 refills | Status: DC

## 2019-07-16 MED ORDER — EXTRANEAL 7.5 % IP SOLN: INTRAPERITONEAL | Status: DC

## 2019-07-16 MED ORDER — INSULIN GLARGINE 100 UNIT/ML ~~LOC~~ SOLN: 15.0000 [IU] | Freq: Every day | SUBCUTANEOUS | 11 refills | Status: DC

## 2019-07-16 MED ORDER — DARBEPOETIN ALFA 100 MCG/0.5ML IJ SOSY
100.0000 ug | PREFILLED_SYRINGE | Freq: Once | INTRAMUSCULAR | Status: AC
  Administered 2019-07-16: 100 ug via SUBCUTANEOUS
  Filled 2019-07-16: qty 0.5

## 2019-07-16 MED ORDER — CARVEDILOL 12.5 MG PO TABS: 12.5000 mg | ORAL_TABLET | Freq: Two times a day (BID) | ORAL | 3 refills | Status: DC

## 2019-07-16 MED ORDER — DELFLEX-LC/2.5% DEXTROSE 394 MOSM/L IP SOLN: INTRAPERITONEAL | 0 refills | Status: AC

## 2019-07-16 MED ORDER — INSULIN ASPART 100 UNIT/ML FLEXPEN: 6.0000 [IU] | PEN_INJECTOR | Freq: Three times a day (TID) | SUBCUTANEOUS | 11 refills | Status: DC

## 2019-07-16 MED ORDER — ASPIRIN 81 MG PO TBEC: 81.0000 mg | DELAYED_RELEASE_TABLET | Freq: Every day | ORAL | 3 refills | Status: AC

## 2019-07-16 MED ORDER — NIFEDIPINE ER OSMOTIC RELEASE 60 MG PO TB24: 60.0000 mg | ORAL_TABLET | Freq: Every day | ORAL | 3 refills | Status: DC

## 2019-07-16 MED FILL — PENTIPS 31G X 8 MM MISC: 31G X 8 MM | 30 days supply | Qty: 100 | Fill #0

## 2019-07-16 MED FILL — CARVEDILOL 12.5 MG TABLET: 12.5 | 30 days supply | Qty: 60 | Fill #0

## 2019-07-16 MED FILL — FUROSEMIDE 80 MG TAB: 80 | 30 days supply | Qty: 60 | Fill #0

## 2019-07-16 MED FILL — NOVOLOG FLEXPEN SYRINGE: 100 | 30 days supply | Qty: 6 | Fill #0

## 2019-07-16 MED FILL — NIFEdipine ER 60 MG TB24: 60 | 30 days supply | Qty: 30 | Fill #0

## 2019-07-16 MED FILL — VELPHORO 500 MG CHEWABLE TA: 500 | 14 days supply | Qty: 42 | Fill #0

## 2019-07-16 MED FILL — SENNA 8.6 MG TABS: 8.6 | 15 days supply | Qty: 30 | Fill #0

## 2019-07-16 NOTE — Progress Notes (Signed)
Renal Navigator spoke with patient's [facility] RN in [place], [place], who states she is waiting on his vaccination record from his [place] clinic (where patient came from prior to treating at her clinic). She has the rest of his records ready to send to [facility]. She states she will go ahead and send what she has. Navigator thanked her for this and asked her to fax his most recent labs and medical sheet to the hospital as well. Renal Navigator followed up with [facility] in hopes to help facilitate acceptance for [facility] here in [facility] and coordinate a safe discharge plan from the hospital.  Renal Navigator will continue to follow.  Alphonzo Cruise, [place] Renal Navigator [phone]

## 2019-07-16 NOTE — Progress Notes (Signed)
A/p Elevated troponin, CABG 2016 Troponin I 13 on admission-cardiology continuing heparin awaiting echo-?  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  O/e BP (!) 149/71 (BP Location: Right Arm)   Pulse 70   Temp 98.1 F (36.7 C) (Oral)   Resp 18   Ht 5\' 10"  (1.778 m)   Wt 113.3 kg   SpO2 98%   BMI 35.84 kg/m  BUN/creatinine 103/21 calcium 8 anion gap 20 Albumin 2.9 Hemoglobin 8.6 RDW 69.53     S 49 year old male originally from [place] DM TY 2, CAD/CABG 2016 HTN ESRD/PD X1 year, HLD Admitted from [facility] ED after moving boxes furniture had exertional chest discomfort shortness of breath--troponin elevated given chest pain started on IV heparin Seen by nephrology/cardiology--later felt elevation troponin II/II hypertensive urgency BP on arrival 227/125

## 2019-07-16 NOTE — Progress Notes (Addendum)
[name] NEPHROLOG[facility] PROGRESS NOTE  Assessment/ Plan: Pt is a 49 y.o. yo male with history of HTN, DM, CAD status post CABG, ESRD on PD now in the process of moving from [place] to [place], admitted with hypertensive urgency.  #Malignant hypertension: BP improved after resuming home medication.  UF during PD as tolerated.  #Chest pain/elevated troponin in a patient with history of CAD: Seen by cardiologist, thought to be due to hypertensive emergency.  Plan to follow outpatient.  # ESRD on PD: Receiving PD with dextrose 2.5%, tolerating well.  Needs better clearance.  He is recently moved to [place].  He was wondering if he can go to [place] PD facility.  I informed this to renal navigator.  He is okay to discharge when OP center arranged.  # Anemia of CKD: Iron saturation 19%.  I will order IV iron and ESA.  Monitor hemoglobin.  # Secondary hyperparathyroidism: Increase the dose of Velphoro to 1000 mg.  Continue VDRA and Sensipar.  Pending PTH level.  Subjective: Seen and examined at bedside.  Tolerating PD well.  Denies nausea vomiting chest pain shortness of breath.   Objective Vital signs in last 24 hours: Vitals:   07/15/19 1830 07/15/19 2100 07/16/19 0541 07/16/19 0730  BP:  125/61 (!) 149/71 136/66  Pulse:  68 70 70  Resp:  18  18  Temp:  (!) 97.5 F (36.4 C) 98.1 F (36.7 C) 98.4 F (36.9 C)  TempSrc: Oral Oral Oral Oral  SpO2:  100% 98% 98%  Weight: 113.3 kg   112.1 kg  Height:       Weight change: -0.099 kg  Intake/Output Summary (Last 24 hours) at 07/16/2019 1321 Last data filed at 07/16/2019 1011 Gross per 24 hour  Intake 535.19 ml  Output -  Net 535.19 ml       Labs: Basic Metabolic Panel: Recent Labs  Lab 07/14/19 1518 07/15/19 0346 07/16/19 0254  NA 136 135 133*  K 4.2 3.7 3.5  CL 94* 93* 91*  CO2 22 21* 22  GLUCOSE 112* 105* 154*  BUN 98* 103* 103*  CREATININE 21.27* 21.72* 21.64*  CALCIUM 8.6* 8.2* 8.0*  PHOS  --  9.5* 9.0*    Liver Function Tests: Recent Labs  Lab 07/16/19 0254  ALBUMIN 2.9*   No results for input(s): LIPASE, AMYLASE in the last 168 hours. No results for input(s): AMMONIA in the last 168 hours. CBC: Recent Labs  Lab 07/14/19 1518 07/15/19 0346 07/16/19 0254  WBC 5.3 5.6 4.5  HGB 9.9* 9.1* 8.6*  HCT 29.9* 27.2* 26.0*  MCV 89.3 88.6 88.4  PLT 227 220 206   Cardiac Enzymes: No results for input(s): CKTOTAL, CKMB, CKMBINDEX, TROPONINI in the last 168 hours. CBG: Recent Labs  Lab 07/15/19 1128 07/15/19 1708 07/15/19 2139 07/16/19 0759 07/16/19 1202  GLUCAP 95 93 199* 118* 126*    Iron Studies:  Recent Labs    07/16/19 0254  IRON 46  TIBC 239*  FERRITIN 434*   Studies/Results: DG Chest 2 View  Result Date: 07/14/2019 CLINICAL DATA:  Chest pain and shortness of breath EXAM: CHEST - 2 VIEW COMPARISON:  None. FINDINGS: There is mild cardiomegaly. Overlying median sternotomy wires are present. There is prominence of the central pulmonary vasculature. Hazy airspace opacity seen at the right lung base. No pleural effusion. No acute osseous abnormality. Overlying vascular stent seen within the region of the left subclavian. IMPRESSION: Cardiomegaly and pulmonary vascular congestion. Hazy airspace opacity at the right  lung base which may be due to atelectasis, or asymmetric edema Electronically Signed   By: Prudencio Pair M.D.   On: 07/14/2019 15:37    Medications: Infusions: . dialysis solution 2.5% low-MG/low-CA      Scheduled Medications: . aspirin EC  81 mg Oral Daily  . carvedilol  12.5 mg Oral BID WC  . cinacalcet  30 mg Oral Q breakfast  . extraneal (ICODEXTRIN) peritoneal dialysis solution   Intraperitoneal Q24H  . furosemide  80 mg Oral BID  . gentamicin cream  1 application Topical Daily  . insulin aspart  0-5 Units Subcutaneous QHS  . insulin aspart  0-6 Units Subcutaneous TID WC  . NIFEdipine  60 mg Oral Daily  . paricalcitol  3 mcg Oral Daily  . rosuvastatin   40 mg Oral Daily  . senna  2 tablet Oral Daily  . sodium chloride flush  3 mL Intravenous Once  . sucroferric oxyhydroxide  1,000 mg Oral TID WC    have reviewed scheduled and prn medications.  Physical Exam: General:NAD, comfortable Heart:RRR, s1s2 nl Lungs:clear b/l, no crackle Abdomen:soft, Non-tender, non-distended Extremities: Trace LE edema Dialysis Access: PD catheter site clean.  Maruice Pieroni Tanna Furry 07/16/2019,1:21 PM  LOS: 2 days  Pager: [phone]

## 2019-07-16 NOTE — Discharge Instructions (Signed)
Hypertension, Adult Hypertension is another name for high blood pressure. High blood pressure forces your heart to work harder to pump blood. This can cause problems over time. There are two numbers in a blood pressure reading. There is a top number (systolic) over a bottom number (diastolic). It is best to have a blood pressure that is below 120/80. Healthy choices can help lower your blood pressure, or you may need medicine to help lower it. What are the causes? The cause of this condition is not known. Some conditions may be related to high blood pressure. What increases the risk?  Smoking.  Having type 2 diabetes mellitus, high cholesterol, or both.  Not getting enough exercise or physical activity.  Being overweight.  Having too much fat, sugar, calories, or salt (sodium) in your diet.  Drinking too much alcohol.  Having long-term (chronic) kidney disease.  Having a family history of high blood pressure.  Age. Risk increases with age.  Race. You may be at higher risk if you are African American.  Gender. Men are at higher risk than women before age 45. After age 65, women are at higher risk than men.  Having obstructive sleep apnea.  Stress. What are the signs or symptoms?  High blood pressure may not cause symptoms. Very high blood pressure (hypertensive crisis) may cause: ? Headache. ? Feelings of worry or nervousness (anxiety). ? Shortness of breath. ? Nosebleed. ? A feeling of being sick to your stomach (nausea). ? Throwing up (vomiting). ? Changes in how you see. ? Very bad chest pain. ? Seizures. How is this treated?  This condition is treated by making healthy lifestyle changes, such as: ? Eating healthy foods. ? Exercising more. ? Drinking less alcohol.  Your health care provider may prescribe medicine if lifestyle changes are not enough to get your blood pressure under control, and if: ? Your top number is above 130. ? Your bottom number is above  80.  Your personal target blood pressure may vary. Follow these instructions at home: Eating and drinking   If told, follow the DASH eating plan. To follow this plan: ? Fill one half of your plate at each meal with fruits and vegetables. ? Fill one fourth of your plate at each meal with whole grains. Whole grains include whole-wheat pasta, brown rice, and whole-grain bread. ? Eat or drink low-fat dairy products, such as skim milk or low-fat yogurt. ? Fill one fourth of your plate at each meal with low-fat (lean) proteins. Low-fat proteins include fish, chicken without skin, eggs, beans, and tofu. ? Avoid fatty meat, cured and processed meat, or chicken with skin. ? Avoid pre-made or processed food.  Eat less than 1,500 mg of salt each day.  Do not drink alcohol if: ? Your doctor tells you not to drink. ? You are pregnant, may be pregnant, or are planning to become pregnant.  If you drink alcohol: ? Limit how much you use to:  0-1 drink a day for women.  0-2 drinks a day for men. ? Be aware of how much alcohol is in your drink. In the [place] one drink equals one 12 oz bottle of beer (355 mL), one 5 oz glass of wine (148 mL), or one 1 oz glass of hard liquor (44 mL). Lifestyle   Work with your doctor to stay at a healthy weight or to lose weight. Ask your doctor what the best weight is for you.  Get at least 30 minutes of exercise most   days of the week. This may include walking, swimming, or biking.  Get at least 30 minutes of exercise that strengthens your muscles (resistance exercise) at least 3 days a week. This may include lifting weights or doing Pilates.  Do not use any products that contain nicotine or tobacco, such as cigarettes, e-cigarettes, and chewing tobacco. If you need help quitting, ask your doctor.  Check your blood pressure at home as told by your doctor.  Keep all follow-up visits as told by your doctor. This is important. Medicines  Take over-the-counter  and prescription medicines only as told by your doctor. Follow directions carefully.  Do not skip doses of blood pressure medicine. The medicine does not work as well if you skip doses. Skipping doses also puts you at risk for problems.  Ask your doctor about side effects or reactions to medicines that you should watch for. Contact a doctor if you:  Think you are having a reaction to the medicine you are taking.  Have headaches that keep coming back (recurring).  Feel dizzy.  Have swelling in your ankles.  Have trouble with your vision. Get help right away if you:  Get a very bad headache.  Start to feel mixed up (confused).  Feel weak or numb.  Feel faint.  Have very bad pain in your: ? Chest. ? Belly (abdomen).  Throw up more than once.  Have trouble breathing. Summary  Hypertension is another name for high blood pressure.  High blood pressure forces your heart to work harder to pump blood.  For most people, a normal blood pressure is less than 120/80.  Making healthy choices can help lower blood pressure. If your blood pressure does not get lower with healthy choices, you may need to take medicine. This information is not intended to replace advice given to you by your health care provider. Make sure you discuss any questions you have with your health care provider. Document Revised: 12/05/2017 Document Reviewed: 12/05/2017 Elsevier Patient Education  2020 [place] for Dialysis Dialysis is a treatment that cleans your blood. It is used when your kidneys are damaged. When you need dialysis, you should watch what you eat. This is because some nutrients can build up in your blood between treatments and make you sick. Your doctor or diet specialist (dietitian) will:  Tell you what nutrients you should include or avoid.  Tell you how much of these nutrients you should get each day.  Help you plan meals.  Tell you how much to drink each  day. What are tips for following this plan? Reading food labels  Check food labels for: ? Potassium. This is found in milk, fruits, and vegetables. ? Phosphorus. This is found in milk, cheese, beans, nuts, and carbonated beverages. ? Salt (sodium). This is in processed meats, cured meats, ready-made frozen meals, canned vegetables, and salty snack foods.  Try to find foods that are low in potassium, phosphorus, and sodium.  Look for foods that are labeled "sodium free," "reduced sodium," or "low sodium." Shopping  Do not buy whole-grain and high-fiber foods.  Do not buy or use salt substitutes.  Do not buy processed foods. Cooking  Drain all fluid from cooked vegetables and canned fruits before you eat them.  Before you cook potatoes, cut them into small pieces. Then boil them in unsalted water.  Try using herbs and spices that do not contain sodium to add flavor. Meal planning Most people on dialysis should try  to eat:  6-11 servings of grains each day. One serving is equal to 1 slice of bread or  cup of cooked rice or pasta.  2-3 servings of low-potassium vegetables each day. One serving is equal to  cup.  2-3 servings of low-potassium fruits each day. One serving is equal to  cup.  Protein, such as meat, poultry, fish, and eggs. Talk with your doctor or dietitian about the right amount and type of protein to eat.   cup of dairy each day. General information  Follow your doctor's instructions about how much to drink. You may be told to: ? Write down what you drink. ? Write down the foods you eat that are made mostly from water, such as gelatin and soups. ? Drink from small cups.  Take vitamin and mineral supplements only as told by your doctor.  Take over-the-counter and prescription medicines only as told by your doctor. What foods can I eat?     Fruits Apples. Fresh or frozen berries. Fresh or canned pears, peaches, and pineapple. Grapes.  Plums. Vegetables Fresh or frozen broccoli, carrots, and green beans. Cabbage. Cauliflower. Celery. Cucumbers. Eggplant. Radishes. Zucchini. Grains White bread. White rice. Cooked cereal. Unsalted popcorn. Tortillas. Pasta. Meats and other proteins Fresh or frozen beef, pork, chicken, and fish. Eggs. Dairy Cream cheese. Heavy cream. Ricotta cheese. Beverages Apple cider. Cranberry juice. Grape juice. Lemonade. Black coffee. Rice milk (that is not enriched or fortified). Seasonings and condiments Herbs. Spices. Jam and jelly. Honey. Sweets and desserts Sherbet. Cakes. Cookies. Fats and oils Olive oil, canola oil, and safflower oil. Other foods Non-dairy creamer. Non-dairy whipped topping. Homemade broth without salt. The items listed above may not be a complete list of foods and beverages you can eat. Contact your dietitian for more options. What foods should I avoid? Fruits Star fruit. Bananas. Oranges. Kiwi. Nectarines. Prunes. Melon. Dried fruit. Avocado. Vegetables Potatoes. Beets. Tomatoes. Winter squash and pumpkin. Asparagus. Spinach. Parsnips. Grains Whole-grain bread. Whole-grain pasta. High-fiber cereal. Meats and other proteins Canned, smoked, and cured meats. Packaged lunch meat. Sardines. Nuts and seeds. Peanut butter. Beans and legumes. Dairy Milk. Buttermilk. Yogurt. Cheese and cottage cheese. Processed cheese spreads. Beverages Orange juice. Prune juice. Carbonated soft drinks. Seasonings and condiments Salt. Salt substitutes. Soy sauce. Sweets and desserts Ice cream. Chocolate. Candied nuts. Fats and oils Butter. Margarine. Other foods Ready-made frozen meals. Canned soups. The items listed above may not be a complete list of foods and beverages you should avoid. Contact your dietitian for more information. Summary  If you are having dialysis, it is important to watch what you eat. Certain nutrients and wastes can build up in your blood and cause you to get  sick.  Your dietitian will help you make an eating plan that meets your needs.  Avoid foods that are high in potassium, salt (sodium), and phosphorus. Restrict fluids as told by your doctor or dietitian. This information is not intended to replace advice given to you by your health care provider. Make sure you discuss any questions you have with your health care provider. Document Revised: 06/13/2017 Document Reviewed: 03/28/2017 Elsevier Patient Education  [place].

## 2019-07-16 NOTE — Discharge Summary (Signed)
Physician Discharge Summary  Donald [name] FAO:[id number] DOB: 29-Jun-1970 DOA: 07/14/2019  PCP: Patient, [name]  Admit date: 07/14/2019 Discharge date: 07/16/2019  Time spent: 45 minutes  Recommendations for Outpatient Follow-up:  1. Needs outpatient coordination of care between nephrology cardiology  2. please follow PTH level as outpatient to adjust renal meds  Discharge Diagnoses:  Principal Problem:   Hypertensive urgency Active Problems:   Chest pain   Discharge Condition: Fair  Diet recommendation: Renal diabetic  Filed Weights   07/15/19 0502 07/15/19 1830 07/16/19 0730  Weight: 113.4 kg 113.3 kg 112.1 kg    History of present illness:  49 year old male originally from [place] DM TY 2, CAD/CABG 2016 HTN ESRD/PD X1 year, HLD Admitted from [facility] ED after moving boxes furniture had exertional chest discomfort shortness of breath--troponin elevated given chest pain started on IV heparin Seen by nephrology/cardiology--later felt elevation troponin II/II hypertensive urgency BP on arrival 227/125  Hospital Course:   Elevated troponin, CABG 2016 Troponin I 13 on admission-echocardiogram showed a normal EF 60-65% so no need of Cath-currently Coreg 12.5 ii/day, Lasix 80 ii/day Procardia 60 Patient has reasonably stabilized but will need outpatient close follow-up  Hypertensive urgency Blood pressures trended 696E to 952W systolic-Case manager engaged to assist with transition planning-need medical records which can be coordinated with outpatient cardiology follow-up  ESRD/PD-presumably 2/2 DM TY 2, HTN Anemia likely renal/chronic disease Nephrology input appreciated with PD-follow transferrin-given IV iron this admission Velphoro 500 3 times daily--continues Sensipar 30 every morning Zemplar 3 mcg daily--- may need substitutions as an outpatient follow-up PTH level will need to continue using PD with 2.5 potassium dextrose And renal navigator will coordinate regarding  [facility] PD  DM TY 2 CBG trends 93-154 on sliding scale--gven refill regular m Colangeloeds   Procedures:  EF 60-65% with grade 1 diastolic dysfunction  Consultations:  Cardiology, nephrology  Discharge Exam: Vitals:   07/16/19 0541 07/16/19 0730  BP: (!) 149/71 136/66  Pulse: 70 70  Resp:  18  Temp: 98.1 F (36.7 C) 98.4 F (36.9 C)  SpO2: 98% 98%    Awake alert coherent no distress EOMI NCAT no focal deficit EOMI NCAT Chest clear no added sound Protuberant abdomen Area of peritoneal dialysis accesses well opposed No lower extremity edema Neurologically intact moving all 4 limbs equally to power   Discharge Instructions   Discharge Instructions    Diet - low sodium heart healthy   Complete by: As directed    Increase activity slowly   Complete by: As directed      Allergies as of 07/16/2019      Reactions   Lisinopril Hives      Medication List    TAKE these medications   aspirin 81 MG EC tablet Take 1 tablet (81 mg total) by mouth daily. Start taking on: July 17, 2019   carvedilol 12.5 MG tablet Commonly known as: COREG Take 1 tablet (12.5 mg total) by mouth 2 (two) times daily with a meal.   cinacalcet 30 MG tablet Commonly known as: SENSIPAR Take 1 tablet (30 mg total) by mouth daily.   dialysis solution 2.5% low-MG/low-CA 394 MOSM/L Soln dianeal solution As per nephro   extraneal (ICODEXTRIN) peritoneal dialysis solution 7.5 % As directed by nephro   furosemide 80 MG tablet Commonly known as: LASIX Take 1 tablet (80 mg total) by mouth 2 (two) times daily.   insulin aspart 100 UNIT/ML FlexPen Commonly known as: NOVOLOG Inject 6 Units into the skin 3 (  three) times daily with meals.   insulin glargine 100 UNIT/ML injection Commonly known as: Lantus Inject 0.15 mLs (15 Units total) into the skin daily.   Insulin Pen Needle 29G X 10MM Misc 1 each by Does not apply route in the morning, at noon, and at bedtime.   NIFEdipine 60 MG 24 hr  tablet Commonly known as: PROCARDIA XL/NIFEDICAL XL Take 1 tablet (60 mg total) by mouth daily. What changed: when to take this   paricalcitol 1 MCG capsule Commonly known as: ZEMPLAR Take 3 capsules (3 mcg total) by mouth daily.   rosuvastatin 40 MG tablet Commonly known as: CRESTOR Take 40 mg by mouth daily.   senna 8.6 MG tablet Commonly known as: SENOKOT Take 2 tablets (17.2 mg total) by mouth daily.   Velphoro 500 MG chewable tablet Generic drug: sucroferric oxyhydroxide Chew 1 tablet (500 mg total) by mouth 3 (three) times daily with meals.      Allergies  Allergen Reactions  . Lisinopril Hives   Follow-up [place]. Go on 08/05/2019.   Specialty: Internal Medicine Why: at 9:20am for hospital follow-up Contact information: [place] [id number] [place] [phone]           The results of significant diagnostics from this hospitalization (including imaging, microbiology, ancillary and laboratory) are listed below for reference.    Significant Diagnostic Studies: DG Chest 2 View  Result Date: 07/14/2019 CLINICAL DATA:  Chest pain and shortness of breath EXAM: CHEST - 2 VIEW COMPARISON:  None. FINDINGS: There is mild cardiomegaly. Overlying median sternotomy wires are present. There is prominence of the central pulmonary vasculature. Hazy airspace opacity seen at the right lung base. No pleural effusion. No acute osseous abnormality. Overlying vascular stent seen within the region of the left subclavian. IMPRESSION: Cardiomegaly and pulmonary vascular congestion. Hazy airspace opacity at the right lung base which may be due to atelectasis, or asymmetric edema Electronically Signed   By: Prudencio Pair M.D.   On: 07/14/2019 15:37   ECHOCARDIOGRAM COMPLETE  Result Date: 07/16/2019    ECHOCARDIOGRAM REPORT   Patient Name:   Donald [name] Date of Exam: 07/16/2019 Medical Rec #:  [id number]    Height:        70.0 in Accession #:    [id number]   Weight:       247.1 lb Date of Birth:  07-18-1970    BSA:          2.284 m Patient Age:    61 years     BP:           136/66 mmHg Patient Gender: M            HR:           70 bpm. Exam Location:  Inpatient Procedure: 2D Echo Indications:    786.50 chest pain  History:        Patient has no prior history of Echocardiogram examinations.                 Prior CABG; Risk Factors:Hypertension and Diabetes.  Sonographer:    Jannett Celestine RDCS (AE) Referring Phys: [id number] JARED E SEGAL IMPRESSIONS  1. Left ventricular ejection fraction, by estimation, is 60 to 65%. The left ventricle has normal function. The left ventricle has no regional wall motion abnormalities. There is mild left ventricular hypertrophy. Left ventricular diastolic parameters are consistent with Grade I diastolic dysfunction (impaired relaxation).  2. Right ventricular systolic function is mildly reduced. The right ventricular size is normal. Tricuspid regurgitation signal is inadequate for assessing PA pressure.  3. The mitral valve is grossly normal. Trivial mitral valve regurgitation.  4. The aortic valve is tricuspid. Aortic valve regurgitation is not visualized.  5. The inferior vena cava is dilated in size with <50% respiratory variability, suggesting right atrial pressure of 15 mmHg. FINDINGS  Left Ventricle: Left ventricular ejection fraction, by estimation, is 60 to 65%. The left ventricle has normal function. The left ventricle has no regional wall motion abnormalities. The left ventricular internal cavity size was normal in size. There is  mild left ventricular hypertrophy. Left ventricular diastolic parameters are consistent with Grade I diastolic dysfunction (impaired relaxation). Indeterminate filling pressures. Right Ventricle: The right ventricular size is normal. No increase in right ventricular wall thickness. Right ventricular systolic function is mildly reduced. Tricuspid regurgitation signal  is inadequate for assessing PA pressure. Left Atrium: Left atrial size was normal in size. Right Atrium: Right atrial size was normal in size. Pericardium: There is no evidence of pericardial effusion. Mitral Valve: The mitral valve is grossly normal. Trivial mitral valve regurgitation. Tricuspid Valve: The tricuspid valve is not well visualized. Tricuspid valve regurgitation is not demonstrated. Aortic Valve: The aortic valve is tricuspid. Aortic valve regurgitation is not visualized. Pulmonic Valve: The pulmonic valve was not well visualized. Pulmonic valve regurgitation is not visualized. Aorta: The aortic root and ascending aorta are structurally normal, with no evidence of dilitation. Venous: The inferior vena cava is dilated in size with less than 50% respiratory variability, suggesting right atrial pressure of 15 mmHg. IAS/Shunts: No atrial level shunt detected by color flow Doppler.  LEFT VENTRICLE PLAX 2D LVIDd:         6.00 cm  Diastology LVIDs:         4.10 cm  LV e' lateral:   9.14 cm/s LV PW:         1.30 cm  LV E/e' lateral: 8.8 LV IVS:        1.10 cm  LV e' medial:    6.09 cm/s LVOT diam:     1.90 cm  LV E/e' medial:  13.2 LV SV:         36 LV SV Index:   16 LVOT Area:     2.84 cm  LEFT ATRIUM           Index LA diam:      4.70 cm 2.06 cm/m LA Vol (A4C): 31.6 ml 13.84 ml/m  AORTIC VALVE LVOT Vmax:   58.40 cm/s LVOT Vmean:  43.500 cm/s LVOT VTI:    0.128 m  AORTA Ao Root diam: 3.00 cm MITRAL VALVE MV Area (PHT): 3.72 cm    SHUNTS MV Decel Time: 204 msec    Systemic VTI:  0.13 m MV E velocity: 80.50 cm/s  Systemic Diam: 1.90 cm MV A velocity: 38.80 cm/s MV E/A ratio:  2.07 Lyman Bishop MD Electronically signed by Lyman Bishop MD Signature Date/Time: 07/16/2019/3:45:14 PM    Final     Microbiology: Recent Results (from the past 240 hour(s))  Respiratory Panel by RT PCR (Flu A&B, Covid) - Nasopharyngeal Swab     Status: None   Collection Time: 07/14/19  3:22 PM   Specimen: Nasopharyngeal Swab   Result Value Ref Range Status   SARS Coronavirus 2 by RT PCR NEGATIVE NEGATIVE Final    Comment: (NOTE) SARS-CoV-2 target nucleic acids are NOT DETECTED. The SARS-CoV-2  RNA is generally detectable in upper respiratoy specimens during the acute phase of infection. The lowest concentration of SARS-CoV-2 viral copies this assay can detect is 131 copies/mL. A negative result does not preclude SARS-Cov-2 infection and should not be used as the sole basis for treatment or other patient management decisions. A negative result may occur with  improper specimen collection/handling, submission of specimen other than nasopharyngeal swab, presence of viral mutation(s) within the areas targeted by this assay, and inadequate number of viral copies (<131 copies/mL). A negative result must be combined with clinical observations, patient history, and epidemiological information. The expected result is Negative. Fact Sheet for Patients:  PinkCheek.be Fact Sheet for Healthcare Providers:  GravelBags.it This test is not yet ap proved or cleared by the [place] FDA and  has been authorized for detection and/or diagnosis of SARS-CoV-2 by FDA under an Emergency Use Authorization (EUA). This EUA will remain  in effect (meaning this test can be used) for the duration of the COVID-19 declaration under Section 564(b)(1) of the Act, 21 U.S.C. section 360bbb-3(b)(1), unless the authorization is terminated or revoked sooner.    Influenza A by PCR NEGATIVE NEGATIVE Final   Influenza B by PCR NEGATIVE NEGATIVE Final    Comment: (NOTE) The Xpert Xpress SARS-CoV-2/FLU/RSV assay is intended as an aid in  the diagnosis of influenza from Nasopharyngeal swab specimens and  should not be used as a sole basis for treatment. Nasal washings and  aspirates are unacceptable for Xpert Xpress SARS-CoV-2/FLU/RSV  testing. Fact Sheet for  Patients: PinkCheek.be Fact Sheet for Healthcare Providers: GravelBags.it This test is not yet approved or cleared by the [place] FDA and  has been authorized for detection and/or diagnosis of SARS-CoV-2 by  FDA under an Emergency Use Authorization (EUA). This EUA will remain  in effect (meaning this test can be used) for the duration of the  Covid-19 declaration under Section 564(b)(1) of the Act, 21  U.S.C. section 360bbb-3(b)(1), unless the authorization is  terminated or revoked. Performed at [facility], [place] [address], [place], [place] [address]      Labs: Basic Metabolic Panel: Recent Labs  Lab 07/14/19 1518 07/15/19 0346 07/16/19 0254  NA 136 135 133*  K 4.2 3.7 3.5  CL 94* 93* 91*  CO2 22 21* 22  GLUCOSE 112* 105* 154*  BUN 98* 103* 103*  CREATININE 21.27* 21.72* 21.64*  CALCIUM 8.6* 8.2* 8.0*  MG  --  1.9  --   PHOS  --  9.5* 9.0*   Liver Function Tests: Recent Labs  Lab 07/16/19 0254  ALBUMIN 2.9*   No results for input(s): LIPASE, AMYLASE in the last 168 hours. No results for input(s): AMMONIA in the last 168 hours. CBC: Recent Labs  Lab 07/14/19 1518 07/15/19 0346 07/16/19 0254  WBC 5.3 5.6 4.5  HGB 9.9* 9.1* 8.6*  HCT 29.9* 27.2* 26.0*  MCV 89.3 88.6 88.4  PLT 227 220 206   Cardiac Enzymes: No results for input(s): CKTOTAL, CKMB, CKMBINDEX, TROPONINI in the last 168 hours. BNP: BNP (last 3 results) No results for input(s): BNP in the last 8760 hours.  ProBNP (last 3 results) No results for input(s): PROBNP in the last 8760 hours.  CBG: Recent Labs  Lab 07/15/19 1128 07/15/19 1708 07/15/19 2139 07/16/19 0759 07/16/19 1202  GLUCAP 95 93 199* 118* 126*       Signed:  Nita Sells MD   [facility] 07/16/2019, 3:59 PM

## 2019-07-16 NOTE — Progress Notes (Signed)
  Echocardiogram 2D Echocardiogram has been performed.  Donald [name] 07/16/2019, 2:44 PM

## 2019-07-16 NOTE — TOC Initial Note (Signed)
Transition of Care [facility]) - Initial/Assessment Note    Patient Details  Name: Donald [name] MRN: [id number] Date of Birth: 1970/10/06  Transition of Care [facility] CM/SW Contact:    Bethena Roys, RN Phone Number: 07/16/2019, 11:34 AM  Clinical Narrative:  Patient presented for- chest pain, shortness of breath- hypertensive urgency. Patient has a hx of ESRD on Peritoneal Dialysis (PD). Patient and wife just recently relocated from [place] to [place] in the last week. Patient moved to be closer to his son. Per patient he just got medications filled about 2 weeks ago and they are in the house in some boxes that he has not unpacked. Patient has Medicare- A&B card presented and on file. Case Manager called the [facility] in [place] to verify part D coverage. Patient has an advantage plan PCS- Medications were sent to [facility] to be filled prior to transition home. Hopefully medications will be able to be filled since filled 2 weeks ago @ previous pharmacy. Case Manager received a referral for primary care provider (PCP)- Case Manager discussed with patient regarding three clinics in [place]. Patient chose the [facility]) for PCP needs. Appointment to be scheduled and placed on the AVS. Patient will be able to utilize the onsite pharmacy thereafter. No further needs identified at this time. Case Manager will continue to follow for additional transition of care needs.              Expected Discharge Plan: Home/Self Care Barriers to Discharge: Continued Medical Work up(PD patient.)   Patient Goals and CMS Choice Patient states their goals for this hospitalization and ongoing recovery are:: to return home   Choice offered to / list presented to : NA  Expected Discharge Plan and Services Expected Discharge Plan: Home/Self Care In-house Referral: PCP / [facility] Discharge Planning Services: CM Consult Post Acute Care Choice:  NA Living arrangements for the past 2 months: Single Family Home                 DME Arranged: N/A    HH Agency: NA     Prior Living Arrangements/Services Living arrangements for the past 2 months: Single Family Home Lives with:: Spouse Patient language and need for interpreter reviewed:: Yes        Need for Family Participation in Patient Care: Yes (Comment) Care giver support system in place?: Yes (comment)      Activities of Daily Living Home Assistive Devices/Equipment: None ADL Screening (condition at time of admission) Patient's cognitive ability adequate to safely complete daily activities?: Yes Is the patient deaf or have difficulty hearing?: No Does the patient have difficulty seeing, even when wearing glasses/contacts?: No Does the patient have difficulty concentrating, remembering, or making decisions?: No Patient able to express need for assistance with ADLs?: Yes Does the patient have difficulty dressing or bathing?: No Independently performs ADLs?: Yes (appropriate for developmental age) Does the patient have difficulty walking or climbing stairs?: No Weakness of Legs: Both Weakness of Arms/Hands: Both   Emotional Assessment Appearance:: Appears stated age Attitude/Demeanor/Rapport: Engaged Affect (typically observed): Appropriate Orientation: : Oriented to Situation, Oriented to Place, Oriented to Self Alcohol / Substance Use: Not Applicable Psych Involvement: No (comment)  Admission diagnosis:  Hypertensive urgency [I16.0] Chest pain, unspecified type [R07.9] Patient Active Problem List   Diagnosis Date Noted  . Hypertensive urgency 07/14/2019  . Chest pain    PCP:  Patient, [name] Pharmacy:   [facility]  [place], [place] [address] [place] [address] Phone: [phone] Fax: [phone]  [facility] [place] [place] - [address] [place] [address] Phone: [phone] Fax: [phone]     Social Determinants of Health (SDOH) Interventions    Readmission Risk Interventions No flowsheet data found.

## 2019-07-16 NOTE — Progress Notes (Addendum)
Progress Note  Patient Name: Donald [name] Date of Encounter: 07/16/2019  Primary Cardiologist: No primary care provider on file.   Subjective   No chest pain this morning. Feeling well, BP has improved.   Inpatient Medications    Scheduled Meds: . aspirin EC  81 mg Oral Daily  . carvedilol  12.5 mg Oral BID WC  . cinacalcet  30 mg Oral Q breakfast  . extraneal (ICODEXTRIN) peritoneal dialysis solution   Intraperitoneal Q24H  . furosemide  80 mg Oral BID  . gentamicin cream  1 application Topical Daily  . insulin aspart  0-5 Units Subcutaneous QHS  . insulin aspart  0-6 Units Subcutaneous TID WC  . NIFEdipine  60 mg Oral Daily  . paricalcitol  3 mcg Oral Daily  . rosuvastatin  40 mg Oral Daily  . senna  2 tablet Oral Daily  . sodium chloride flush  3 mL Intravenous Once  . sucroferric oxyhydroxide  1,000 mg Oral TID WC   Continuous Infusions: . dialysis solution 2.5% low-MG/low-CA     PRN Meds: acetaminophen, dianeal solution for CAPD/CCPD with heparin, ondansetron (ZOFRAN) IV   Vital Signs    Vitals:   07/15/19 1830 07/15/19 2100 07/16/19 0541 07/16/19 0730  BP:  125/61 (!) 149/71 136/66  Pulse:  68 70 70  Resp:  18  18  Temp:  (!) 97.5 F (36.4 C) 98.1 F (36.7 C) 98.4 F (36.9 C)  TempSrc: Oral Oral Oral Oral  SpO2:  100% 98% 98%  Weight: 113.3 kg   112.1 kg  Height:        Intake/Output Summary (Last 24 hours) at 07/16/2019 0832 Last data filed at 07/16/2019 0300 Gross per 24 hour  Intake 295.19 ml  Output --  Net 295.19 ml   Last 3 Weights 07/16/2019 07/15/2019 07/15/2019  Weight (lbs) 247 lb 2.2 oz 249 lb 12.5 oz 250 lb  Weight (kg) 112.1 kg 113.3 kg 113.399 kg      Telemetry    SR - Personally Reviewed  ECG    No new tracing this morning.   Physical Exam  Pleasant AAM, sitting up in bed GEN: No acute distress.   Neck: No JVD Cardiac: RRR, no murmurs, rubs, or gallops.  Respiratory: Clear to auscultation bilaterally. GI: Soft, nontender,  non-distended  MS: No edema; No deformity. Neuro:  Nonfocal  Psych: Normal affect   Labs    High Sensitivity Troponin:   Recent Labs  Lab 07/14/19 1518 07/14/19 1730  TROPONINIHS 113* 98*      Chemistry Recent Labs  Lab 07/14/19 1518 07/15/19 0346 07/16/19 0254  NA 136 135 133*  K 4.2 3.7 3.5  CL 94* 93* 91*  CO2 22 21* 22  GLUCOSE 112* 105* 154*  BUN 98* 103* 103*  CREATININE 21.27* 21.72* 21.64*  CALCIUM 8.6* 8.2* 8.0*  ALBUMIN  --   --  2.9*  GFRNONAA 2* 2* 2*  GFRAA 3* 3* 3*  ANIONGAP 20* 21* 20*     Hematology Recent Labs  Lab 07/14/19 1518 07/15/19 0346 07/16/19 0254  WBC 5.3 5.6 4.5  RBC 3.35* 3.07* 2.94*  HGB 9.9* 9.1* 8.6*  HCT 29.9* 27.2* 26.0*  MCV 89.3 88.6 88.4  MCH 29.6 29.6 29.3  MCHC 33.1 33.5 33.1  RDW 16.2* 16.2* 16.1*  PLT 227 220 206    BNPNo results for input(s): BNP, PROBNP in the last 168 hours.   DDimer No results for input(s): DDIMER in the last 168 hours.  Radiology    DG Chest 2 View  Result Date: 07/14/2019 CLINICAL DATA:  Chest pain and shortness of breath EXAM: CHEST - 2 VIEW COMPARISON:  None. FINDINGS: There is mild cardiomegaly. Overlying median sternotomy wires are present. There is prominence of the central pulmonary vasculature. Hazy airspace opacity seen at the right lung base. No pleural effusion. No acute osseous abnormality. Overlying vascular stent seen within the region of the left subclavian. IMPRESSION: Cardiomegaly and pulmonary vascular congestion. Hazy airspace opacity at the right lung base which may be due to atelectasis, or asymmetric edema Electronically Signed   By: Prudencio Pair M.D.   On: 07/14/2019 15:37    Cardiac Studies   Echo: pending  Patient Profile     49 y.o. male with PMH of CAD s/p CABG ('16), HTN, HL, ESRD on PD, and DM who presented with chest pain in the setting of severely elevated blood pressures. Had been out of his medications for about a week.   Assessment & Plan    1.  Chest pain/elevated troponin: does have hx of CAD with CABG in 2016 per his report. Blood pressures were significantly elevated on admission. hsTn 113>>98. He was started on IV heparin. Significant improvement in BP being back on medications. Suspect his pain/elevated troponin was demand 2/2 to hypertensive crisis and renal disease. Will stop heparin this morning.  -- echo pending today, if no significant abnormality noted would be reasonable to discharge with outpatient follow up. Will arrange.  -- will plan to obtain records from [place] as he reports undergoing transplant work up with echo and stress test back in March.   2. ESRD on PD: started about a year ago. Seen by nephrology. Had treatment this morning and tolerated well.   3. HTN: reports being out all his meds for about a week since moving. Much improved with the resumption of therapy.   4. HL: back on statin. LDL 72  5. DM: Hgb A1c 6.1 -- SSI  For questions or updates, please contact [place] Please consult [email or web address] for contact info under   Signed, Reino Bellis, NP  07/16/2019, 8:32 AM    Patient seen, examined. Available data reviewed. Agree with findings, assessment, and plan as outlined by Reino Bellis, NP. The patient is independently interviewed and examined. On my exam, he is alert, oriented, in no distress. JVP is normal, lungs have scattered rhonchi, heart is regular rate and rhythm with no murmur or gallop, abdomen is soft and nontender, extremities have no edema, skin is warm and dry without rash. The patient now feels well with no residual chest pain. I agree that his clinical presentation is consistent with hypertensive emergency as he was off of all of his antihypertensive medicines for about 1 week prior to admission yesterday. Mild, flat high-sensitivity troponin elevation is noted in this patient on peritoneal dialysis with known CAD and prior CABG. I do not think this is indicative of acute coronary  syndrome. His heparin will be discontinued. An echocardiogram will be completed. He had a recent nuclear and echo studies and we will obtain these records. Will arrange outpatient follow-up to review his records and establish his cardiology care here in [place]. As long as his echo shows no major abnormalities, he can be discharged today from a cardiac perspective.  Sherren Mocha, M.D. 07/16/2019 8:52 AM

## 2019-07-16 NOTE — Progress Notes (Signed)
Renal Navigator has confirmed that Home Therapy PD clinic at [facility] has received records from [place]. Navigator has also sent records from the hospital and asked Dr. Carolin Sicks to order a HepB surface antigen as requested by the clinic before patient is discharged. Per Shirlee Limerick D/Home Therapy Secretary, patient has now been scheduled to see Dr. Cristal Generous on 07/23/19 at 12:00pm at [place] [facility] [place] met with patient to inform verbally and in writing. Navigator confirmed with patient that he still has almost a month worth of PD supplies with him. Patient was pleasant and appreciative. Patient is cleared for discharge from a Renal standpoint per Dr. Carolin Sicks now that he has an appointment to follow up with his PD needs. Attending/Dr. Verlon Au and CM/B. Graves-Bigelow aware.  Alphonzo Cruise, [place] Renal Naviagator [phone]

## 2019-07-16 NOTE — Progress Notes (Signed)
ANTICOAGULATION CONSULT NOTE - Follow Up Consult  Pharmacy Consult for Heparin Indication: chest pain/ACS  Allergies  Allergen Reactions  . Lisinopril Hives    Patient Measurements: Height: 5\' 10"  (177.8 cm) Weight: 113.3 kg (249 lb 12.5 oz) IBW/kg (Calculated) : 73 Heparin Dosing Weight: 98 kg  Vital Signs: Temp: 98.1 F (36.7 C) (04/07 0541) Temp Source: Oral (04/07 0541) BP: 149/71 (04/07 0541) Pulse Rate: 70 (04/07 0541)  Labs: Recent Labs    07/14/19 1518 07/14/19 1518 07/14/19 1730 07/15/19 0346 07/15/19 0715 07/15/19 1438 07/16/19 0254  HGB 9.9*   < >  --  9.1*  --   --  8.6*  HCT 29.9*  --   --  27.2*  --   --  26.0*  PLT 227  --   --  220  --   --  206  LABPROT 14.0  --   --   --   --   --   --   INR 1.1  --   --   --   --   --   --   HEPARINUNFRC  --   --   --   --  0.48 0.35 0.39  CREATININE 21.27*  --   --  21.72*  --   --  21.64*  TROPONINIHS 113*  --  98*  --   --   --   --    < > = values in this interval not displayed.    Estimated Creatinine Clearance: 5.3 mL/min (A) (by C-G formula based on SCr of 21.64 mg/dL (H)).   Medications:  Infusions:  . dialysis solution 2.5% low-MG/low-CA    . heparin 1,600 Units/hr (07/15/19 1738)    Assessment: 49 year old male on IV Heparin per pharmacy dosing protocol for acute coronary syndrome/ chest pain.   Heparin level is therapeutic for goal at 0.39 on rate of 1600 units/hr. Hgb is low but stable. Platelets are stable. No bleeding is reported.   Goal of Therapy:  Heparin level 0.3-0.7 units/ml Monitor platelets by anticoagulation protocol: Yes   Plan:  Continue heparin to 1600 units/hr  Daily Heparin level and CBC while on therapy.  Monitor for s/sx of bleeding   Acey Lav, PharmD  PGY1 Acute Care Pharmacy Resident  Please refer to [facility] for [place] numbers 07/16/2019,7:27 AM

## 2019-07-17 LAB — PARATHYROID HORMONE, INTACT (NO CA): PTH: 652 pg/mL — ABNORMAL HIGH (ref 15–65)

## 2019-08-05 ENCOUNTER — Ambulatory Visit: Payer: Medicare Other | Admitting: Family Medicine

## 2019-12-31 ENCOUNTER — Inpatient Hospital Stay
Admission: EM | Admit: 2019-12-31 | Discharge: 2020-01-04 | DRG: 380 | Disposition: A | Discharge status: Home / Self Care | Payer: Medicare Other | Attending: Internal Medicine | Admitting: Internal Medicine

## 2019-12-31 ENCOUNTER — Encounter: Payer: Self-pay | Admitting: Internal Medicine

## 2019-12-31 ENCOUNTER — Other Ambulatory Visit: Payer: Self-pay

## 2019-12-31 ENCOUNTER — Other Ambulatory Visit: Payer: Self-pay | Admitting: Nephrology

## 2019-12-31 DIAGNOSIS — R778 Other specified abnormalities of plasma proteins: Secondary | ICD-10-CM

## 2019-12-31 DIAGNOSIS — K2081 Other esophagitis with bleeding: Secondary | ICD-10-CM | POA: Diagnosis present

## 2019-12-31 DIAGNOSIS — Z79899 Other long term (current) drug therapy: Secondary | ICD-10-CM

## 2019-12-31 DIAGNOSIS — N186 End stage renal disease: Secondary | ICD-10-CM | POA: Diagnosis not present

## 2019-12-31 DIAGNOSIS — K920 Hematemesis: Secondary | ICD-10-CM | POA: Diagnosis not present

## 2019-12-31 DIAGNOSIS — E871 Hypo-osmolality and hyponatremia: Secondary | ICD-10-CM | POA: Diagnosis not present

## 2019-12-31 DIAGNOSIS — E1142 Type 2 diabetes mellitus with diabetic polyneuropathy: Secondary | ICD-10-CM | POA: Diagnosis present

## 2019-12-31 DIAGNOSIS — Z951 Presence of aortocoronary bypass graft: Secondary | ICD-10-CM

## 2019-12-31 DIAGNOSIS — Z20822 Contact with and (suspected) exposure to covid-19: Secondary | ICD-10-CM | POA: Diagnosis present

## 2019-12-31 DIAGNOSIS — I2510 Atherosclerotic heart disease of native coronary artery without angina pectoris: Secondary | ICD-10-CM | POA: Diagnosis present

## 2019-12-31 DIAGNOSIS — E1122 Type 2 diabetes mellitus with diabetic chronic kidney disease: Secondary | ICD-10-CM | POA: Diagnosis present

## 2019-12-31 DIAGNOSIS — B258 Other cytomegaloviral diseases: Secondary | ICD-10-CM | POA: Diagnosis present

## 2019-12-31 DIAGNOSIS — Z6835 Body mass index (BMI) 35.0-35.9, adult: Secondary | ICD-10-CM

## 2019-12-31 DIAGNOSIS — I132 Hypertensive heart and chronic kidney disease with heart failure and with stage 5 chronic kidney disease, or end stage renal disease: Secondary | ICD-10-CM | POA: Diagnosis present

## 2019-12-31 DIAGNOSIS — R1110 Vomiting, unspecified: Secondary | ICD-10-CM

## 2019-12-31 DIAGNOSIS — K2211 Ulcer of esophagus with bleeding: Principal | ICD-10-CM | POA: Diagnosis present

## 2019-12-31 DIAGNOSIS — E785 Hyperlipidemia, unspecified: Secondary | ICD-10-CM | POA: Diagnosis present

## 2019-12-31 DIAGNOSIS — I10 Essential (primary) hypertension: Secondary | ICD-10-CM | POA: Diagnosis not present

## 2019-12-31 DIAGNOSIS — I248 Other forms of acute ischemic heart disease: Secondary | ICD-10-CM | POA: Diagnosis present

## 2019-12-31 DIAGNOSIS — R400 Somnolence: Secondary | ICD-10-CM | POA: Diagnosis present

## 2019-12-31 DIAGNOSIS — N2581 Secondary hyperparathyroidism of renal origin: Secondary | ICD-10-CM | POA: Diagnosis present

## 2019-12-31 DIAGNOSIS — D631 Anemia in chronic kidney disease: Secondary | ICD-10-CM | POA: Diagnosis present

## 2019-12-31 DIAGNOSIS — E876 Hypokalemia: Secondary | ICD-10-CM | POA: Diagnosis not present

## 2019-12-31 DIAGNOSIS — Z992 Dependence on renal dialysis: Secondary | ICD-10-CM

## 2019-12-31 DIAGNOSIS — I493 Ventricular premature depolarization: Secondary | ICD-10-CM | POA: Diagnosis present

## 2019-12-31 DIAGNOSIS — K922 Gastrointestinal hemorrhage, unspecified: Secondary | ICD-10-CM | POA: Diagnosis present

## 2019-12-31 DIAGNOSIS — Z7982 Long term (current) use of aspirin: Secondary | ICD-10-CM

## 2019-12-31 DIAGNOSIS — I160 Hypertensive urgency: Secondary | ICD-10-CM | POA: Diagnosis not present

## 2019-12-31 DIAGNOSIS — E669 Obesity, unspecified: Secondary | ICD-10-CM | POA: Diagnosis present

## 2019-12-31 DIAGNOSIS — R7989 Other specified abnormal findings of blood chemistry: Secondary | ICD-10-CM

## 2019-12-31 DIAGNOSIS — D649 Anemia, unspecified: Secondary | ICD-10-CM

## 2019-12-31 DIAGNOSIS — Z794 Long term (current) use of insulin: Secondary | ICD-10-CM

## 2019-12-31 LAB — PROTIME-INR
INR: 1.2 (ref 0.8–1.2)
Prothrombin Time: 14.5 seconds (ref 11.4–15.2)

## 2019-12-31 LAB — CBC
HCT: 18.3 % — ABNORMAL LOW (ref 39.0–52.0)
HCT: 19.2 % — ABNORMAL LOW (ref 39.0–52.0)
Hemoglobin: 6.1 g/dL — ABNORMAL LOW (ref 13.0–17.0)
Hemoglobin: 6.5 g/dL — ABNORMAL LOW (ref 13.0–17.0)
MCH: 31.3 pg (ref 26.0–34.0)
MCH: 31.1 pg (ref 26.0–34.0)
MCHC: 33.3 g/dL (ref 30.0–36.0)
MCHC: 33.9 g/dL (ref 30.0–36.0)
MCV: 93.8 fL (ref 80.0–100.0)
MCV: 91.9 fL (ref 80.0–100.0)
Platelets: 207 10*3/uL (ref 150–400)
Platelets: 222 10*3/uL (ref 150–400)
RBC: 1.95 MIL/uL — ABNORMAL LOW (ref 4.22–5.81)
RBC: 2.09 MIL/uL — ABNORMAL LOW (ref 4.22–5.81)
RDW: 16 % — ABNORMAL HIGH (ref 11.5–15.5)
RDW: 15.8 % — ABNORMAL HIGH (ref 11.5–15.5)
WBC: 5.6 10*3/uL (ref 4.0–10.5)
WBC: 8.4 10*3/uL (ref 4.0–10.5)
nRBC: 0 % (ref 0.0–0.2)
nRBC: 0 % (ref 0.0–0.2)

## 2019-12-31 LAB — BASIC METABOLIC PANEL
Anion gap: 11 (ref 5–15)
BUN: 71 mg/dL — ABNORMAL HIGH (ref 6–20)
CO2: 26 mmol/L (ref 22–32)
Calcium: 7.9 mg/dL — ABNORMAL LOW (ref 8.9–10.3)
Chloride: 94 mmol/L — ABNORMAL LOW (ref 98–111)
Creatinine, Ser: 14.53 mg/dL — ABNORMAL HIGH (ref 0.61–1.24)
GFR calc Af Amer: 4 mL/min — ABNORMAL LOW (ref >60)
GFR calc non Af Amer: 3 mL/min — ABNORMAL LOW (ref >60)
Glucose, Bld: 311 mg/dL — ABNORMAL HIGH (ref 70–99)
Potassium: 4.1 mmol/L (ref 3.5–5.1)
Sodium: 131 mmol/L — ABNORMAL LOW (ref 135–145)

## 2019-12-31 LAB — ABO/RH: ABO/RH(D): O POS

## 2019-12-31 LAB — PREPARE RBC (CROSSMATCH)

## 2019-12-31 LAB — GLUCOSE, CAPILLARY
Glucose-Capillary: 223 mg/dL — ABNORMAL HIGH (ref 70–99)
Glucose-Capillary: 201 mg/dL — ABNORMAL HIGH (ref 70–99)

## 2019-12-31 LAB — APTT: aPTT: 32 seconds (ref 24–36)

## 2019-12-31 LAB — SARS CORONAVIRUS 2 BY RT PCR (HOSPITAL ORDER, PERFORMED IN ~~LOC~~ HOSPITAL LAB): SARS Coronavirus 2: NEGATIVE

## 2019-12-31 MED ORDER — INSULIN GLARGINE 100 UNIT/ML ~~LOC~~ SOLN
10.0000 [IU] | Freq: Every day | SUBCUTANEOUS | Status: DC
  Administered 2019-12-31 – 2020-01-03 (×4): 10 [IU] via SUBCUTANEOUS
  Filled 2019-12-31 (×5): qty 0.1

## 2019-12-31 MED ORDER — CINACALCET HCL 30 MG PO TABS
30.0000 mg | ORAL_TABLET | Freq: Every day | ORAL | Status: DC
  Filled 2019-12-31 (×2): qty 1

## 2019-12-31 MED ORDER — SODIUM CHLORIDE 0.9 % IV SOLN: INTRAVENOUS | Status: DC

## 2019-12-31 MED ORDER — SENNA 8.6 MG PO TABS
1.0000 | ORAL_TABLET | Freq: Every day | ORAL | Status: DC
  Administered 2020-01-03 – 2020-01-04 (×2): 8.6 mg via ORAL
  Filled 2019-12-31 (×2): qty 1

## 2019-12-31 MED ORDER — SUCROFERRIC OXYHYDROXIDE 500 MG PO CHEW
500.0000 mg | CHEWABLE_TABLET | Freq: Three times a day (TID) | ORAL | Status: DC
  Administered 2020-01-02 – 2020-01-04 (×5): 500 mg via ORAL
  Filled 2019-12-31 (×12): qty 1

## 2019-12-31 MED ORDER — ONDANSETRON HCL 4 MG/2ML IJ SOLN
4.0000 mg | Freq: Three times a day (TID) | INTRAMUSCULAR | Status: DC | PRN
  Administered 2019-12-31 – 2020-01-04 (×6): 4 mg via INTRAVENOUS
  Filled 2019-12-31 (×5): qty 2

## 2019-12-31 MED ORDER — PANTOPRAZOLE SODIUM 40 MG IV SOLR
40.0000 mg | Freq: Once | INTRAVENOUS | Status: AC
  Administered 2019-12-31: 40 mg via INTRAVENOUS
  Filled 2019-12-31: qty 40

## 2019-12-31 MED ORDER — INSULIN GLARGINE 100 UNIT/ML ~~LOC~~ SOLN
10.0000 [IU] | Freq: Every day | SUBCUTANEOUS | Status: DC
  Filled 2019-12-31: qty 0.1

## 2019-12-31 MED ORDER — CARVEDILOL 12.5 MG PO TABS
12.5000 mg | ORAL_TABLET | Freq: Two times a day (BID) | ORAL | Status: DC
  Administered 2020-01-02 – 2020-01-04 (×4): 12.5 mg via ORAL
  Filled 2019-12-31 (×4): qty 1

## 2019-12-31 MED ORDER — INSULIN ASPART 100 UNIT/ML ~~LOC~~ SOLN
0.0000 [IU] | Freq: Every day | SUBCUTANEOUS | Status: DC
  Administered 2020-01-01: 4 [IU] via SUBCUTANEOUS
  Filled 2019-12-31 (×2): qty 1

## 2019-12-31 MED ORDER — ACETAMINOPHEN 325 MG PO TABS
650.0000 mg | ORAL_TABLET | Freq: Four times a day (QID) | ORAL | Status: DC | PRN
  Administered 2020-01-02: 650 mg via ORAL
  Filled 2019-12-31: qty 2

## 2019-12-31 MED ORDER — PANTOPRAZOLE SODIUM 40 MG IV SOLR
40.0000 mg | Freq: Two times a day (BID) | INTRAVENOUS | Status: DC
  Administered 2020-01-01 – 2020-01-04 (×7): 40 mg via INTRAVENOUS
  Filled 2019-12-31 (×8): qty 40

## 2019-12-31 MED ORDER — NIFEDIPINE ER 60 MG PO TB24
60.0000 mg | ORAL_TABLET | Freq: Every day | ORAL | Status: DC
  Administered 2020-01-03 – 2020-01-04 (×2): 60 mg via ORAL
  Filled 2019-12-31 (×4): qty 1

## 2019-12-31 MED ORDER — ROSUVASTATIN CALCIUM 10 MG PO TABS
40.0000 mg | ORAL_TABLET | Freq: Every day | ORAL | Status: DC
  Administered 2019-12-31 – 2020-01-01 (×2): 40 mg via ORAL
  Filled 2019-12-31 (×2): qty 4

## 2019-12-31 MED ORDER — HYDRALAZINE HCL 20 MG/ML IJ SOLN
5.0000 mg | INTRAMUSCULAR | Status: DC | PRN
  Administered 2020-01-01: 5 mg via INTRAVENOUS
  Filled 2019-12-31: qty 1

## 2019-12-31 MED ORDER — INSULIN ASPART 100 UNIT/ML ~~LOC~~ SOLN
0.0000 [IU] | Freq: Three times a day (TID) | SUBCUTANEOUS | Status: DC
  Administered 2019-12-31: 3 [IU] via SUBCUTANEOUS
  Administered 2020-01-01 (×3): 2 [IU] via SUBCUTANEOUS
  Filled 2019-12-31 (×3): qty 1

## 2019-12-31 MED ORDER — SODIUM CHLORIDE 0.9 % IV SOLN
10.0000 mL/h | Freq: Once | INTRAVENOUS | Status: AC
  Administered 2020-01-03: 10 mL/h via INTRAVENOUS

## 2019-12-31 MED ORDER — PARICALCITOL 1 MCG PO CAPS: 3.0000 ug | ORAL_CAPSULE | Freq: Every day | ORAL | Status: DC

## 2019-12-31 MED ORDER — ONDANSETRON HCL 4 MG/2ML IJ SOLN
4.0000 mg | Freq: Once | INTRAMUSCULAR | Status: AC
  Administered 2019-12-31: 4 mg via INTRAVENOUS
  Filled 2019-12-31: qty 2

## 2019-12-31 NOTE — ED Notes (Signed)
Spoke with Nephrology, plan is to hold off on his dialysis till tomorrow after his endoscopy.

## 2019-12-31 NOTE — Progress Notes (Signed)
[facility]  ROUNDING NOTE   Subjective:   [name] admitted to [facility] on 12/31/2019 for Hematemesis [K92.0]  Patient is known to our practice and seen by my partner earlier today.   Objective:  Vital signs in last 24 hours:  Temp:  [98.2 F (36.8 C)-98.7 F (37.1 C)] 98.7 F (37.1 C) (09/22 1622) Pulse Rate:  [88-91] 90 (09/22 1622) Resp:  [14-16] 16 (09/22 1622) BP: (127-130)/(55-72) 129/72 (09/22 1622) SpO2:  [98 %-100 %] 98 % (09/22 1622) Weight:  [105.6 kg] 105.6 kg (09/22 1238)  Weight change:  Filed Weights   12/31/19 1238  Weight: 105.6 kg    Intake/Output: No intake/output data recorded.   Intake/Output this shift:  No intake/output data recorded.  Physical Exam: not examined  Basic Metabolic Panel: Recent Labs  Lab 12/31/19 1248  NA 131*  K 4.1  CL 94*  CO2 26  GLUCOSE 311*  BUN 71*  CREATININE 14.53*  CALCIUM 7.9*    Liver Function Tests: No results for input(s): AST, ALT, ALKPHOS, BILITOT, PROT, ALBUMIN in the last 168 hours. No results for input(s): LIPASE, AMYLASE in the last 168 hours. No results for input(s): AMMONIA in the last 168 hours.  CBC: Recent Labs  Lab 12/31/19 1248  WBC 5.6  HGB 6.1*  HCT 18.3*  MCV 93.8  PLT 207    Cardiac Enzymes: No results for input(s): CKTOTAL, CKMB, CKMBINDEX, TROPONINI in the last 168 hours.  BNP: Invalid input(s): POCBNP  CBG: Recent Labs  Lab 12/31/19 6160  VPXTGG 269*    Microbiology: Results for orders placed or performed during the hospital encounter of 12/31/19  SARS Coronavirus 2 by RT PCR (hospital order, performed in [facility] hospital lab) Nasopharyngeal Nasopharyngeal Swab     Status: None   Collection Time: 12/31/19  2:42 PM   Specimen: Nasopharyngeal Swab  Result Value Ref Range Status   SARS Coronavirus 2 NEGATIVE NEGATIVE Final    Comment: (NOTE) SARS-CoV-2 target nucleic acids are NOT DETECTED.  The SARS-CoV-2 RNA is generally detectable in  upper and lower respiratory specimens during the acute phase of infection. The lowest concentration of SARS-CoV-2 viral copies this assay can detect is 250 copies / mL. A negative result does not preclude SARS-CoV-2 infection and should not be used as the sole basis for treatment or other patient management decisions.  A negative result may occur with improper specimen collection / handling, submission of specimen other than nasopharyngeal swab, presence of viral mutation(s) within the areas targeted by this assay, and inadequate number of viral copies (<250 copies / mL). A negative result must be combined with clinical observations, patient history, and epidemiological information.  Fact Sheet for Patients:   StrictlyIdeas.no  Fact Sheet for Healthcare Providers: BankingDealers.co.za  This test is not yet approved or  cleared by the [place] FDA and has been authorized for detection and/or diagnosis of SARS-CoV-2 by FDA under an Emergency Use Authorization (EUA).  This EUA will remain in effect (meaning this test can be used) for the duration of the COVID-19 declaration under Section 564(b)(1) of the Act, 21 U.S.C. section 360bbb-3(b)(1), unless the authorization is terminated or revoked sooner.  Performed at [facility], [address] [place], [place] [address]     Coagulation Studies: Recent Labs    12/31/19 1540  LABPROT 14.5  INR 1.2    Urinalysis: No results for input(s): COLORURINE, LABSPEC, PHURINE, GLUCOSEU, HGBUR, BILIRUBINUR, KETONESUR, PROTEINUR, UROBILINOGEN, NITRITE, LEUKOCYTESUR in the last  72 hours.  Invalid input(s): APPERANCEUR    Imaging: No results found.   Medications:   . sodium chloride Stopped (12/31/19 1613)  . sodium chloride     . insulin aspart  0-5 Units Subcutaneous QHS  . insulin aspart  0-9 Units Subcutaneous TID WC  . [START ON 01/01/2020] pantoprazole (PROTONIX) IV   40 mg Intravenous Q12H   acetaminophen, hydrALAZINE, ondansetron (ZOFRAN) IV  Assessment/ Plan:  Donald [name] is a 49 y.o.  male with end stage renal disease on peritoneal dialysis, peptic ulcer disease, diabetes mellitus type II insulin dependent, hypertension, coronary artery disease status post CABG, who is [facility] to [facility] on 12/31/2019 for Hematemesis [K92.0]  [facility] 113kg CCPD 5 cycles 2910mL with last fill 2037mL extraneal.   1. End Stage Renal Disease: on peritoneal dialysis.  - Drain last fill of extraneal.  - Hold peritoneal dialysis tonight and resume peritoneal dialysis after tomorrow's endoscopy.   2. Anemia with chronic kidney disease: with recent peptic ulcers With GI bleed - Appreciate GI input  3. Hypertension: holding blood pressure medications due to hypotension. Holding carvedilol and nifedipine  4. Secondary Hyperparathyroidism: outpatient labs on 9/16: PTH elevated 1011. Phos 5.1, calcium 8.2 - Auryxia with meals.  - cinacalcet - paricalcitol   LOS: 0 Slyvia Lartigue 9/22/20215:16 PM

## 2019-12-31 NOTE — Progress Notes (Signed)
Patient seen in PD clinic  Reports vomiting blood tinged fluid x 3 this morning Last Hgb 8.9 on 9/16 Recent h/o upper endoscopy = bleeding ulcer and CMV per patient Feeling woozy upon standing  Will refer to ER via transport

## 2019-12-31 NOTE — ED Notes (Signed)
Pt with episode of emesis while in triage, vomiting approximately 430mL dark red blood.  PIV and new orders placed.  Charge RN made aware; pt to be roomed next.

## 2019-12-31 NOTE — ED Notes (Signed)
First Nurse Note:  Pt arrives via [facility] from Dialysis; pt performs PD at home with a monthly session at the dialysis center for monitoring.  During treatment pt became lightheaded, feeling as if he was going to pass out.  Pt nauseous on arrival.  A/Ox4, NAD noted at this time.

## 2019-12-31 NOTE — ED Notes (Signed)
Pt states he make little to no urine

## 2019-12-31 NOTE — Consult Note (Signed)
Consultation  Referring Provider:     ED Admit date: 9/22 Consult date: 9/22         Reason for Consultation:     Hematemesis         HPI:   Donald [name] is a 49 y.o. male with history of ESRD on peritoneal dialysis who presented with hematemesis. Per patient, he had EGD about one week ago at [facility] in [place] where he had esophageal ulcers that were biopsied and he was diagnosed with CMV. He has been on treatment for this but this morning he had episodes of hematemesis for which he was sent in for evaluation. He has been on PPI therapy since the EGD last week. Baseline hemoglobin is in the 8-9 range but is is now in the 6's. Denies alcohol or NSAID use. No blood thinners except aspirin. No family history of any GI malignancies.   Past Medical History:  Diagnosis Date  . Diabetes mellitus without complication ([place])   . Hypertension   . Renal disorder     Past Surgical History:  Procedure Laterality Date  . AV FISTULA PLACEMENT    . CARDIAC SURGERY    . CORONARY ARTERY BYPASS GRAFT  2016  . INSERTION OF DIALYSIS CATHETER      No family history on file. No family history of GI malignancies  Social History   Tobacco Use  . Smoking status: Never Smoker  . Smokeless tobacco: Never Used  Substance Use Topics  . Alcohol use: Yes    Comment: occasional   . Drug use: Not on file    Prior to Admission medications   Medication Sig Start Date End Date Taking? Authorizing Provider  aspirin EC 81 MG EC tablet Take 1 tablet (81 mg total) by mouth daily. 07/17/19   Nita Sells, MD  carvedilol (COREG) 12.5 MG tablet Take 1 tablet (12.5 mg total) by mouth 2 (two) times daily with a meal. 07/16/19   Nita Sells, MD  cinacalcet (SENSIPAR) 30 MG tablet Take 1 tablet (30 mg total) by mouth daily. 07/16/19   Nita Sells, MD  extraneal, ICODEXTRIN, peritoneal dialysis solution (ICODEXTRIN) 7.5 % As directed by nephro 07/16/19   Nita Sells, MD   furosemide (LASIX) 80 MG tablet Take 1 tablet (80 mg total) by mouth 2 (two) times daily. 07/16/19   Nita Sells, MD  insulin aspart (NOVOLOG) 100 UNIT/ML FlexPen Inject 6 Units into the skin 3 (three) times daily with meals. 07/16/19   Nita Sells, MD  insulin glargine (LANTUS) 100 UNIT/ML injection Inject 0.15 mLs (15 Units total) into the skin daily. 07/16/19   Nita Sells, MD  Insulin Pen Needle 29G X 10MM MISC 1 each by Does not apply route in the morning, at noon, and at bedtime. 07/16/19   Nita Sells, MD  NIFEdipine (PROCARDIA XL/NIFEDICAL XL) 60 MG 24 hr tablet Take 1 tablet (60 mg total) by mouth daily. 07/16/19   Nita Sells, MD  paricalcitol (ZEMPLAR) 1 MCG capsule Take 3 capsules (3 mcg total) by mouth daily. 07/16/19   Nita Sells, MD  Peritoneal Dialysis Solutions (DIALYSIS SOLUTION 2.5% LOW-MG/LOW-CA) 394 MOSM/L SOLN dianeal solution As per nephro 07/16/19   Nita Sells, MD  rosuvastatin (CRESTOR) 40 MG tablet Take 40 mg by mouth daily.    [provider]  senna (SENOKOT) 8.6 MG tablet Take 2 tablets (17.2 mg total) by mouth daily. 07/16/19   Nita Sells, MD  sucroferric oxyhydroxide (VELPHORO) 500 MG chewable tablet Chew 1 tablet (  500 mg total) by mouth 3 (three) times daily with meals. 07/16/19   Nita Sells, MD    Current Facility-Administered Medications  Medication Dose Route Frequency Provider Last Rate Last Admin  . 0.9 %  sodium chloride infusion  10 mL/hr Intravenous Once Lavonia Drafts, MD      . pantoprazole (PROTONIX) injection 40 mg  40 mg Intravenous Once Lavonia Drafts, MD       Current Outpatient Medications  Medication Sig Dispense Refill  . aspirin EC 81 MG EC tablet Take 1 tablet (81 mg total) by mouth daily. 30 tablet 3  . carvedilol (COREG) 12.5 MG tablet Take 1 tablet (12.5 mg total) by mouth 2 (two) times daily with a meal. 60 tablet 3  . cinacalcet (SENSIPAR) 30 MG tablet Take 1  tablet (30 mg total) by mouth daily. 30 tablet 3  . extraneal, ICODEXTRIN, peritoneal dialysis solution (ICODEXTRIN) 7.5 % As directed by nephro    . furosemide (LASIX) 80 MG tablet Take 1 tablet (80 mg total) by mouth 2 (two) times daily. 60 tablet 3  . insulin aspart (NOVOLOG) 100 UNIT/ML FlexPen Inject 6 Units into the skin 3 (three) times daily with meals. 15 mL 11  . insulin glargine (LANTUS) 100 UNIT/ML injection Inject 0.15 mLs (15 Units total) into the skin daily. 10 mL 11  . Insulin Pen Needle 29G X 10MM MISC 1 each by Does not apply route in the morning, at noon, and at bedtime. 60 each 3  . NIFEdipine (PROCARDIA XL/NIFEDICAL XL) 60 MG 24 hr tablet Take 1 tablet (60 mg total) by mouth daily. 30 tablet 3  . paricalcitol (ZEMPLAR) 1 MCG capsule Take 3 capsules (3 mcg total) by mouth daily. 90 capsule 3  . Peritoneal Dialysis Solutions (DIALYSIS SOLUTION 2.5% LOW-MG/LOW-CA) 394 MOSM/L SOLN dianeal solution As per nephro  0  . rosuvastatin (CRESTOR) 40 MG tablet Take 40 mg by mouth daily.    [name] senna (SENOKOT) 8.6 MG tablet Take 2 tablets (17.2 mg total) by mouth daily. 30 tablet 3  . sucroferric oxyhydroxide (VELPHORO) 500 MG chewable tablet Chew 1 tablet (500 mg total) by mouth 3 (three) times daily with meals. 90 tablet 3    Allergies as of 12/31/2019 - Review Complete 12/31/2019  Allergen Reaction Noted  . Lisinopril Hives 07/14/2019     Review of Systems:    All systems reviewed and negative except where noted in HPI.  Review of Systems  Constitutional: Negative for chills and fever.  Respiratory: Negative for cough.   Cardiovascular: Negative for chest pain.  Gastrointestinal: Positive for nausea and vomiting. Negative for abdominal pain, blood in stool, constipation, diarrhea and melena.  Psychiatric/Behavioral: Negative for substance abuse.  All other systems reviewed and are negative.     Physical Exam:  Vital signs in last 24 hours: Temp:  [98.5 F (36.9 C)] 98.5 F  (36.9 C) (09/22 1237) Pulse Rate:  [88] 88 (09/22 1237) Resp:  [16] 16 (09/22 1237) BP: (127)/(55) 127/55 (09/22 1237) SpO2:  [100 %] 100 % (09/22 1237) Weight:  [105.6 kg] 105.6 kg (09/22 1238)   General:   Pleasant in mild distress Head:  Normocephalic and atraumatic. Eyes:   No icterus.   Neck:  Supple Lungs:  No respiratory distress Heart:  RRR Abdomen:   Flat, soft, nondistended, nontender Rectal:  Not performed.  Msk:  No focal deficits Neurologic:  Alert and  oriented x4; Skin:  Warm, dry, pink without significant lesions or rashes. Psych:  Alert and cooperative. Normal affect.  LAB RESULTS: Recent Labs    12/31/19 1248  WBC 5.6  HGB 6.1*  HCT 18.3*  PLT 207   BMET Recent Labs    12/31/19 1248  NA 131*  K 4.1  CL 94*  CO2 26  GLUCOSE 311*  BUN 71*  CREATININE 14.53*  CALCIUM 7.9*   LFT No results for input(s): PROT, ALBUMIN, AST, ALT, ALKPHOS, BILITOT, BILIDIR, IBILI in the last 72 hours. PT/INR No results for input(s): LABPROT, INR in the last 72 hours.  STUDIES: No results found.     Impression / Plan:   Mr. Kirby is a 49 y/o gentleman with recent history of CMV esophagitis who presents with hematemesis likely from esophageal ulcers  - type and screen - trend hemoglobins - resuscitate and transfuse to maintain hemoglobin > 7 - IV Protonix BID - anti-emetics prn - clear liquid diet - NPO at midnight - please check an INR - please consult nephrology for continuation of peritoneal dialysis - will plan on EGD tomorrow  Raylene Miyamoto MD, MPH [place]

## 2019-12-31 NOTE — ED Provider Notes (Signed)
[facility] Emergency Department Provider Note   ____________________________________________    I have reviewed the triage vital signs and the nursing notes.   HISTORY  Chief Complaint Dizziness     HPI Donald [name] is a 49 y.o. male with a history of diabetes, hypertension, end-stage renal disease on peritoneal dialysis who presents with complaints of weakness and vomiting blood.  Patient reports this morning he woke up around 4 AM and had to vomit, he noticed significant outs of dark blood with clots in his vomitus.  This is continued several times this morning.  Triage nurse reports patient did vomit several 100 cc of bloody vomitus while in triage.  Patient denies abdominal pain.  Review of records demonstrates the patient has seen Dr. Lorie Apley of GI for epigastric discomfort and is scheduled for EGD in October.  Past Medical History:  Diagnosis Date  . Diabetes mellitus without complication ([place])   . Hypertension   . Renal disorder     Patient Active Problem List   Diagnosis Date Noted  . Hypertensive urgency 07/14/2019  . Chest pain     Past Surgical History:  Procedure Laterality Date  . AV FISTULA PLACEMENT    . CARDIAC SURGERY    . CORONARY ARTERY BYPASS GRAFT  2016  . INSERTION OF DIALYSIS CATHETER      Prior to Admission medications   Medication Sig Start Date End Date Taking? Authorizing Provider  aspirin EC 81 MG EC tablet Take 1 tablet (81 mg total) by mouth daily. 07/17/19   Nita Sells, MD  carvedilol (COREG) 12.5 MG tablet Take 1 tablet (12.5 mg total) by mouth 2 (two) times daily with a meal. 07/16/19   Nita Sells, MD  cinacalcet (SENSIPAR) 30 MG tablet Take 1 tablet (30 mg total) by mouth daily. 07/16/19   Nita Sells, MD  extraneal, ICODEXTRIN, peritoneal dialysis solution (ICODEXTRIN) 7.5 % As directed by nephro 07/16/19   Nita Sells, MD  furosemide (LASIX) 80 MG tablet Take 1 tablet (80 mg  total) by mouth 2 (two) times daily. 07/16/19   Nita Sells, MD  insulin aspart (NOVOLOG) 100 UNIT/ML FlexPen Inject 6 Units into the skin 3 (three) times daily with meals. 07/16/19   Nita Sells, MD  insulin glargine (LANTUS) 100 UNIT/ML injection Inject 0.15 mLs (15 Units total) into the skin daily. 07/16/19   Nita Sells, MD  Insulin Pen Needle 29G X 10MM MISC 1 each by Does not apply route in the morning, at noon, and at bedtime. 07/16/19   Nita Sells, MD  NIFEdipine (PROCARDIA XL/NIFEDICAL XL) 60 MG 24 hr tablet Take 1 tablet (60 mg total) by mouth daily. 07/16/19   Nita Sells, MD  paricalcitol (ZEMPLAR) 1 MCG capsule Take 3 capsules (3 mcg total) by mouth daily. 07/16/19   Nita Sells, MD  Peritoneal Dialysis Solutions (DIALYSIS SOLUTION 2.5% LOW-MG/LOW-CA) 394 MOSM/L SOLN dianeal solution As per nephro 07/16/19   Nita Sells, MD  rosuvastatin (CRESTOR) 40 MG tablet Take 40 mg by mouth daily.    [provider]  senna (SENOKOT) 8.6 MG tablet Take 2 tablets (17.2 mg total) by mouth daily. 07/16/19   Nita Sells, MD  sucroferric oxyhydroxide (VELPHORO) 500 MG chewable tablet Chew 1 tablet (500 mg total) by mouth 3 (three) times daily with meals. 07/16/19   Nita Sells, MD     Allergies Lisinopril  No family history on file.  Social History Social History   Tobacco Use  . Smoking status:  Never Smoker  . Smokeless tobacco: Never Used  Substance Use Topics  . Alcohol use: Yes    Comment: occasional   . Drug use: Not on file    Review of Systems  Constitutional: No fever/chills Eyes: No visual changes.  ENT: No sore throat. Cardiovascular: Denies chest pain. Respiratory: Denies shortness of breath. Gastrointestinal: As above, no abdominal pain currently, vomiting as above Genitourinary: Negative for dysuria. Musculoskeletal: Negative for back pain. Skin: Negative for rash. Neurological: Negative for  headaches   ____________________________________________   PHYSICAL EXAM:  VITAL SIGNS: ED Triage Vitals  Enc Vitals Group     BP 12/31/19 1237 (!) 127/55     Pulse Rate 12/31/19 1237 88     Resp 12/31/19 1237 16     Temp 12/31/19 1237 98.5 F (36.9 C)     Temp Source 12/31/19 1237 Oral     SpO2 12/31/19 1237 100 %     Weight 12/31/19 1238 105.6 kg (232 lb 12.9 oz)     Height 12/31/19 1238 1.778 m (5\' 10" )     Head Circumference --      Peak Flow --      Pain Score 12/31/19 1238 0     Pain Loc --      Pain Edu? --      Excl. in [place]? --     Constitutional: Alert and oriented.   Nose: No congestion/rhinnorhea. Mouth/Throat: Mucous membranes are moist.    Cardiovascular: Normal rate, regular rhythm.  Good peripheral circulation. Respiratory: Normal respiratory effort.  No retractions. Lungs CTAB. Gastrointestinal: Soft and nontender. No distention.  No CVA tenderness.  Musculoskeletal: Warm and well perfused Neurologic:  Normal speech and language. No gross focal neurologic deficits are appreciated.  Skin:  Skin is warm, dry and intact. No rash noted. Psychiatric: Mood and affect are normal. Speech and behavior are normal.  ____________________________________________   LABS (all labs ordered are listed, but only abnormal results are displayed)  Labs Reviewed  BASIC METABOLIC PANEL - Abnormal; Notable for the following components:      Result Value   Sodium 131 (*)    Chloride 94 (*)    Glucose, Bld 311 (*)    BUN 71 (*)    Creatinine, Ser 14.53 (*)    Calcium 7.9 (*)    GFR calc non Af Amer 3 (*)    GFR calc Af Amer 4 (*)    All other components within normal limits  CBC - Abnormal; Notable for the following components:   RBC 1.95 (*)    Hemoglobin 6.1 (*)    HCT 18.3 (*)    RDW 16.0 (*)    All other components within normal limits  SARS CORONAVIRUS 2 BY RT PCR (HOSPITAL ORDER, [place] [facility])  URINALYSIS, COMPLETE (UACMP) WITH  MICROSCOPIC  CBG MONITORING, ED  TYPE AND SCREEN  PREPARE RBC (CROSSMATCH)   ____________________________________________  EKG  ED ECG REPORT I, Lavonia Drafts, the attending physician, personally viewed and interpreted this ECG.  Date: 12/31/2019  Rhythm: normal sinus rhythm QRS Axis: normal Intervals: normal ST/T Wave abnormalities: Nonspecific ST changes Narrative Interpretation: no evidence of acute ischemia  ____________________________________________  RADIOLOGY  None ____________________________________________   PROCEDURES  Procedure(s) performed: No  Procedures   Critical Care performed: yes  CRITICAL CARE Performed by: Lavonia Drafts   Total critical care time: 30 minutes  Critical care time was exclusive of separately billable procedures and treating other patients.  Critical care  was necessary to treat or prevent imminent or life-threatening deterioration.  Critical care was time spent personally by me on the following activities: development of treatment plan with patient and/or surrogate as well as nursing, discussions with consultants, evaluation of patient's response to treatment, examination of patient, obtaining history from patient or surrogate, ordering and performing treatments and interventions, ordering and review of laboratory studies, ordering and review of radiographic studies, pulse oximetry and re-evaluation of patient's condition.  ____________________________________________   INITIAL IMPRESSION / ASSESSMENT AND PLAN / ED COURSE  Pertinent labs & imaging results that were available during my care of the patient were reviewed by me and considered in my medical decision making (see chart for details).  Patient presents with vomiting blood as described above.  Given prior work-up and symptoms of epigastric discomfort, strongly suspicious for PUD.  Hemoglobin is 6.1 down from 8.6 prior  Patient is on peritoneal dialysis, does  reportedly take aspirin daily  Has seen Dr. Lorie Apley of GI [facility] for epigastric discomfort work-up  Nurse witnessed bloody vomitus in triage room.  Vital signs are overall reassuring, stable blood pressure and heart rate.  I have ordered packed red blood cells 1 unit as well as 40 mg of Protonix IV.  Patient denies alcohol abuse  Discussed with Dr. Haig Prophet of GI, who will see the patient  We will discuss with hospitalist for admission    ____________________________________________   FINAL CLINICAL IMPRESSION(S) / ED DIAGNOSES  Final diagnoses:  Acute upper GI bleed        Note:  This document was prepared using Dragon voice recognition software and may include unintentional dictation errors.   Lavonia Drafts, MD 12/31/19 [phone]

## 2019-12-31 NOTE — H&P (Signed)
History and Physical    Donald [name] JAS:[id number] DOB: 05/03/1970 DOA: 12/31/2019  Referring MD/NP/PA:   PCP: Patient, [name]   Patient coming from:  The patient is coming from home.  At baseline, pt is independent for most of ADL.        Chief Complaint: Hematemesis  HPI: Donald [name] is a 49 y.o. male with medical history significant of ESRD on peritoneal dialysis, HTN, HLD, DM, esophageal ulcer, who presents with hematemesis.  Pt states that he he has had several episodes of vomiting blood with dark-colored blood since this morning.  Patient had another episode of hematemesis in the ED with approximate 400cc vomitus.  Denies abdominal pain or bloody stool.  Patient has mild cough, no shortness breath or chest pain.  Patient has dizziness and generalized weakness.  No symptoms of UTI or unilateral weakness. Of note, pt states that he had EGD recently at [facility] in [place] where he was found to have esophageal ulcers that were biopsied and he was diagnosed with CMV. Pt is taking ASA 81 mg daily. Not taking NSAID.   ED Course: pt was found to have hemoglobin 6.1 (8. 6 on 07/16/2019), pending COVID-19 PCR, potassium 4.1, bicarbonate 26, creatinine 14.5, BUN 71.  Temperature normal.  Blood pressure 127/55, heart rate 88, RR 16, oxygen saturation 100%.  Patient is placed on MedSurg bed of observation.  Check talked Lockle[place] is consulted.   Review of Systems:   General: no fevers, chills, no body weight gain, has poor appetite, has fatigue HEENT: no blurry vision, hearing changes or sore throat Respiratory: no dyspnea, has coughing, no wheezing CV: no chest pain, no palpitations GI: Has hematemesis, no abdominal pain, diarrhea, constipation GU: no dysuria, burning on urination, increased urinary frequency, hematuria  Ext: no leg edema Neuro: no unilateral weakness, numbness, or tingling, no vision change or hearing loss.  Has dizziness. Skin: no rash, no skin  tear. MSK: No muscle spasm, no deformity, no limitation of range of movement in spin Heme: No easy bruising.  Travel history: No recent long distant travel.  Allergy:  Allergies  Allergen Reactions  . Lisinopril Hives    Past Medical History:  Diagnosis Date  . Diabetes mellitus without complication ([place])   . Hypertension   . Renal disorder     Past Surgical History:  Procedure Laterality Date  . AV FISTULA PLACEMENT    . CARDIAC SURGERY    . CORONARY ARTERY BYPASS GRAFT  2016  . INSERTION OF DIALYSIS CATHETER      Social History:  reports that he has never smoked. He has never used smokeless tobacco. He reports current alcohol use. No history on file for drug use.  Family History:  Family History  Problem Relation Age of Onset  . Diabetes Mellitus II Mother   . Heart disease Mother      Prior to Admission medications   Medication Sig Start Date End Date Taking? Authorizing Provider  aspirin EC 81 MG EC tablet Take 1 tablet (81 mg total) by mouth daily. 07/17/19   Nita Sells, MD  carvedilol (COREG) 12.5 MG tablet Take 1 tablet (12.5 mg total) by mouth 2 (two) times daily with a meal. 07/16/19   Nita Sells, MD  cinacalcet (SENSIPAR) 30 MG tablet Take 1 tablet (30 mg total) by mouth daily. 07/16/19   Nita Sells, MD  extraneal, ICODEXTRIN, peritoneal dialysis solution (ICODEXTRIN) 7.5 % As directed by nephro 07/16/19   Nita Sells, MD  furosemide (LASIX) 80 MG tablet Take 1 tablet (80 mg total) by mouth 2 (two) times daily. 07/16/19   Nita Sells, MD  insulin aspart (NOVOLOG) 100 UNIT/ML FlexPen Inject 6 Units into the skin 3 (three) times daily with meals. 07/16/19   Nita Sells, MD  insulin glargine (LANTUS) 100 UNIT/ML injection Inject 0.15 mLs (15 Units total) into the skin daily. 07/16/19   Nita Sells, MD  Insulin Pen Needle 29G X 10MM MISC 1 each by Does not apply route in the morning, at noon, and at bedtime. 07/16/19    Nita Sells, MD  NIFEdipine (PROCARDIA XL/NIFEDICAL XL) 60 MG 24 hr tablet Take 1 tablet (60 mg total) by mouth daily. 07/16/19   Nita Sells, MD  paricalcitol (ZEMPLAR) 1 MCG capsule Take 3 capsules (3 mcg total) by mouth daily. 07/16/19   Nita Sells, MD  Peritoneal Dialysis Solutions (DIALYSIS SOLUTION 2.5% LOW-MG/LOW-CA) 394 MOSM/L SOLN dianeal solution As per nephro 07/16/19   Nita Sells, MD  rosuvastatin (CRESTOR) 40 MG tablet Take 40 mg by mouth daily.    [provider]  senna (SENOKOT) 8.6 MG tablet Take 2 tablets (17.2 mg total) by mouth daily. 07/16/19   Nita Sells, MD  sucroferric oxyhydroxide (VELPHORO) 500 MG chewable tablet Chew 1 tablet (500 mg total) by mouth 3 (three) times daily with meals. 07/16/19   Nita Sells, MD    Physical Exam: Vitals:   12/31/19 1606 12/31/19 1622 12/31/19 1730 12/31/19 1800  BP: 130/69 129/72 138/62 (!) 116/54  Pulse: 91 90 97 94  Resp: 14 16 20 19   Temp: 98.2 F (36.8 C) 98.7 F (37.1 C)    TempSrc: Oral Oral    SpO2:  98% 100% 100%  Weight:      Height:       General: Not in acute distress HEENT:       Eyes: PERRL, EOMI, no scleral icterus.       ENT: No discharge from the ears and nose, no pharynx injection, no tonsillar enlargement.        Neck: No JVD, no bruit, no mass felt. Heme: No neck lymph node enlargement. Cardiac: S1/S2, RRR, No murmurs, No gallops or rubs. Respiratory: Good air movement bilaterally. No rales, wheezing, rhonchi or rubs. GI: Soft, nondistended, nontender, no rebound pain, no organomegaly, BS present. GU: No hematuria Ext: No pitting leg edema bilaterally. 2+DP/PT pulse bilaterally. Musculoskeletal: No joint deformities, No joint redness or warmth, no limitation of ROM in spin. Skin: No rashes.  Neuro: Alert, oriented X3, cranial nerves II-XII grossly intact, moves all extremities normally. Muscle strength 5/5 in all extremities, sensation to light touch  intact. Brachial reflex 2+ bilaterally. Knee reflex 1+ bilaterally. Negative Babinski's sign. Normal finger to nose test. Psych: Patient is not psychotic, no suicidal or hemocidal ideation.  Labs on Admission: I have personally reviewed following labs and imaging studies  CBC: Recent Labs  Lab 12/31/19 1248  WBC 5.6  HGB 6.1*  HCT 18.3*  MCV 93.8  PLT 778   Basic Metabolic Panel: Recent Labs  Lab 12/31/19 1248  NA 131*  K 4.1  CL 94*  CO2 26  GLUCOSE 311*  BUN 71*  CREATININE 14.53*  CALCIUM 7.9*   GFR: Estimated Creatinine Clearance: 7.6 mL/min (A) (by C-G formula based on SCr of 14.53 mg/dL (H)). Liver Function Tests: No results for input(s): AST, ALT, ALKPHOS, BILITOT, PROT, ALBUMIN in the last 168 hours. No results for input(s): LIPASE, AMYLASE in the last 168 hours.  No results for input(s): AMMONIA in the last 168 hours. Coagulation Profile: Recent Labs  Lab 12/31/19 1540  INR 1.2   Cardiac Enzymes: No results for input(s): CKTOTAL, CKMB, CKMBINDEX, TROPONINI in the last 168 hours. BNP (last 3 results) No results for input(s): PROBNP in the last 8760 hours. HbA1C: No results for input(s): HGBA1C in the last 72 hours. CBG: Recent Labs  Lab 12/31/19 1634  GLUCAP 223*   Lipid Profile: No results for input(s): CHOL, HDL, LDLCALC, TRIG, CHOLHDL, LDLDIRECT in the last 72 hours. Thyroid Function Tests: No results for input(s): TSH, T4TOTAL, FREET4, T3FREE, THYROIDAB in the last 72 hours. Anemia Panel: No results for input(s): VITAMINB12, FOLATE, FERRITIN, TIBC, IRON, RETICCTPCT in the last 72 hours. Urine analysis: No results found for: COLORURINE, APPEARANCEUR, LABSPEC, PHURINE, GLUCOSEU, HGBUR, BILIRUBINUR, KETONESUR, PROTEINUR, UROBILINOGEN, NITRITE, LEUKOCYTESUR Sepsis Labs: @LABRCNTIP (procalcitonin:4,lacticidven:4) ) Recent Results (from the past 240 hour(s))  SARS Coronavirus 2 by RT PCR (hospital order, performed in [facility] hospital lab)  Nasopharyngeal Nasopharyngeal Swab     Status: None   Collection Time: 12/31/19  2:42 PM   Specimen: Nasopharyngeal Swab  Result Value Ref Range Status   SARS Coronavirus 2 NEGATIVE NEGATIVE Final    Comment: (NOTE) SARS-CoV-2 target nucleic acids are NOT DETECTED.  The SARS-CoV-2 RNA is generally detectable in upper and lower respiratory specimens during the acute phase of infection. The lowest concentration of SARS-CoV-2 viral copies this assay can detect is 250 copies / mL. A negative result does not preclude SARS-CoV-2 infection and should not be used as the sole basis for treatment or other patient management decisions.  A negative result may occur with improper specimen collection / handling, submission of specimen other than nasopharyngeal swab, presence of viral mutation(s) within the areas targeted by this assay, and inadequate number of viral copies (<250 copies / mL). A negative result must be combined with clinical observations, patient history, and epidemiological information.  Fact Sheet for Patients:   StrictlyIdeas.no  Fact Sheet for Healthcare Providers: BankingDealers.co.za  This test is not yet approved or  cleared by the [place] FDA and has been authorized for detection and/or diagnosis of SARS-CoV-2 by FDA under an Emergency Use Authorization (EUA).  This EUA will remain in effect (meaning this test can be used) for the duration of the COVID-19 declaration under Section 564(b)(1) of the Act, 21 U.S.C. section 360bbb-3(b)(1), unless the authorization is terminated or revoked sooner.  Performed at [facility], [address], [place],  [address]      Radiological Exams on Admission: No results found.   EKG: Independently reviewed.  Sinus rhythm, QTC 447, LAE, T wave inversion in inferior leads and V3-V6 (no significant change compared with previous EKG)   Assessment/Plan Principal  Problem:   Hematemesis Active Problems:   Type II diabetes mellitus with renal manifestations (HCC)   Hypertension   ESRD on peritoneal dialysis ([place])   HLD (hyperlipidemia)   Hematemesis: Hgb 8.6 -->6.1. Dr. Haig Prophet of is consulted  - will place in med-surg bed obs - hold ASA - transfuse 1 units of blood now - GI consulted by Ed, will follow up recommendations - NPO after MN for EGD - IVF: 50 mL/hr of NS - Start IV pantoprazole 40 mg bid - Zofran IV for nausea - Avoid NSAIDs and SQ heparin - Maintain IV access (2 large bore IVs if possible). - Monitor closely and follow q6h cbc, transfuse as necessary, if Hgb<7.0 - LaB: INR, PTT and type  screen  Type II diabetes mellitus with renal manifestations [facility]): Recent A1c 6.1, well controlled.  Patient is taking NovoLog and Lantus at home -SSI  -Decrease Lantus from 15 to 10 units daily  Hypertension -IV hydralazine as needed -Continue home Coreg, nifedipine  ESRD on peritoneal dialysis [facility]) -Dr. Juleen China of renal is consulted for dialysis  HLD (hyperlipidemia) -Crestor      DVT ppx: SCD Code Status: Full code Family Communication: not done, no family member is at bed side.    Disposition Plan:  Anticipate discharge back to previous environment Consults called: Dr. Haig Prophet of GI Admission status: Med-surg bed for obs  Status is: Observation  The patient remains OBS appropriate and will d/c before 2 midnights.  Dispo: The patient is from: Home              Anticipated d/c is to: Home              Anticipated d/c date is: 1 day              Patient currently is not medically stable to d/c.          Date of Service 12/31/2019    Ivor Costa [facility]   If 7PM-7AM, please contact night-coverage [email or web address] 12/31/2019, 6:31 PM

## 2019-12-31 NOTE — ED Triage Notes (Signed)
Pt here with dizziness, pt denies LOC. Pt was vomiting blood that had clots in it. Pt NAD in triage.

## 2019-12-31 NOTE — ED Notes (Signed)
This RN attempted IV. Unsuccessful attempt at this time.

## 2019-12-31 NOTE — ED Notes (Signed)
Pt called this RN into the room, pt IV pump beeping at this time. Unable to flush IV. Paused blood infusion at this time.

## 2020-01-01 ENCOUNTER — Inpatient Hospital Stay: Payer: Medicare Other

## 2020-01-01 DIAGNOSIS — N2581 Secondary hyperparathyroidism of renal origin: Secondary | ICD-10-CM | POA: Diagnosis present

## 2020-01-01 DIAGNOSIS — K920 Hematemesis: Secondary | ICD-10-CM | POA: Diagnosis present

## 2020-01-01 DIAGNOSIS — R400 Somnolence: Secondary | ICD-10-CM | POA: Diagnosis present

## 2020-01-01 DIAGNOSIS — Z79899 Other long term (current) drug therapy: Secondary | ICD-10-CM | POA: Diagnosis not present

## 2020-01-01 DIAGNOSIS — K922 Gastrointestinal hemorrhage, unspecified: Secondary | ICD-10-CM | POA: Diagnosis not present

## 2020-01-01 DIAGNOSIS — B258 Other cytomegaloviral diseases: Secondary | ICD-10-CM | POA: Diagnosis present

## 2020-01-01 DIAGNOSIS — Z951 Presence of aortocoronary bypass graft: Secondary | ICD-10-CM | POA: Diagnosis not present

## 2020-01-01 DIAGNOSIS — I493 Ventricular premature depolarization: Secondary | ICD-10-CM | POA: Diagnosis present

## 2020-01-01 DIAGNOSIS — Z794 Long term (current) use of insulin: Secondary | ICD-10-CM | POA: Diagnosis not present

## 2020-01-01 DIAGNOSIS — K2211 Ulcer of esophagus with bleeding: Secondary | ICD-10-CM | POA: Diagnosis present

## 2020-01-01 DIAGNOSIS — Z7982 Long term (current) use of aspirin: Secondary | ICD-10-CM | POA: Diagnosis not present

## 2020-01-01 DIAGNOSIS — E871 Hypo-osmolality and hyponatremia: Secondary | ICD-10-CM | POA: Diagnosis not present

## 2020-01-01 DIAGNOSIS — I160 Hypertensive urgency: Secondary | ICD-10-CM | POA: Diagnosis not present

## 2020-01-01 DIAGNOSIS — E1122 Type 2 diabetes mellitus with diabetic chronic kidney disease: Secondary | ICD-10-CM | POA: Diagnosis present

## 2020-01-01 DIAGNOSIS — Z6835 Body mass index (BMI) 35.0-35.9, adult: Secondary | ICD-10-CM | POA: Diagnosis not present

## 2020-01-01 DIAGNOSIS — D631 Anemia in chronic kidney disease: Secondary | ICD-10-CM | POA: Diagnosis present

## 2020-01-01 DIAGNOSIS — R778 Other specified abnormalities of plasma proteins: Secondary | ICD-10-CM | POA: Diagnosis not present

## 2020-01-01 DIAGNOSIS — E669 Obesity, unspecified: Secondary | ICD-10-CM | POA: Diagnosis present

## 2020-01-01 DIAGNOSIS — E876 Hypokalemia: Secondary | ICD-10-CM | POA: Diagnosis not present

## 2020-01-01 DIAGNOSIS — N186 End stage renal disease: Secondary | ICD-10-CM | POA: Diagnosis present

## 2020-01-01 DIAGNOSIS — I248 Other forms of acute ischemic heart disease: Secondary | ICD-10-CM | POA: Diagnosis present

## 2020-01-01 DIAGNOSIS — I132 Hypertensive heart and chronic kidney disease with heart failure and with stage 5 chronic kidney disease, or end stage renal disease: Secondary | ICD-10-CM | POA: Diagnosis present

## 2020-01-01 DIAGNOSIS — E785 Hyperlipidemia, unspecified: Secondary | ICD-10-CM | POA: Diagnosis present

## 2020-01-01 DIAGNOSIS — Z20822 Contact with and (suspected) exposure to covid-19: Secondary | ICD-10-CM | POA: Diagnosis present

## 2020-01-01 DIAGNOSIS — Z992 Dependence on renal dialysis: Secondary | ICD-10-CM | POA: Diagnosis not present

## 2020-01-01 DIAGNOSIS — I2510 Atherosclerotic heart disease of native coronary artery without angina pectoris: Secondary | ICD-10-CM | POA: Diagnosis present

## 2020-01-01 LAB — GLUCOSE, CAPILLARY
Glucose-Capillary: 163 mg/dL — ABNORMAL HIGH (ref 70–99)
Glucose-Capillary: 152 mg/dL — ABNORMAL HIGH (ref 70–99)
Glucose-Capillary: 162 mg/dL — ABNORMAL HIGH (ref 70–99)
Glucose-Capillary: 286 mg/dL — ABNORMAL HIGH (ref 70–99)
Glucose-Capillary: 301 mg/dL — ABNORMAL HIGH (ref 70–99)

## 2020-01-01 LAB — CBC
HCT: 17.3 % — ABNORMAL LOW (ref 39.0–52.0)
HCT: 18.5 % — ABNORMAL LOW (ref 39.0–52.0)
Hemoglobin: 6.2 g/dL — ABNORMAL LOW (ref 13.0–17.0)
Hemoglobin: 6.3 g/dL — ABNORMAL LOW (ref 13.0–17.0)
MCH: 32 pg (ref 26.0–34.0)
MCH: 30.9 pg (ref 26.0–34.0)
MCHC: 35.8 g/dL (ref 30.0–36.0)
MCHC: 34.1 g/dL (ref 30.0–36.0)
MCV: 89.2 fL (ref 80.0–100.0)
MCV: 90.7 fL (ref 80.0–100.0)
Platelets: 210 10*3/uL (ref 150–400)
Platelets: 206 10*3/uL (ref 150–400)
RBC: 1.94 MIL/uL — ABNORMAL LOW (ref 4.22–5.81)
RBC: 2.04 MIL/uL — ABNORMAL LOW (ref 4.22–5.81)
RDW: 16.1 % — ABNORMAL HIGH (ref 11.5–15.5)
RDW: 16.3 % — ABNORMAL HIGH (ref 11.5–15.5)
WBC: 7.4 10*3/uL (ref 4.0–10.5)
WBC: 7 10*3/uL (ref 4.0–10.5)
nRBC: 0 % (ref 0.0–0.2)
nRBC: 0 % (ref 0.0–0.2)

## 2020-01-01 LAB — HEMOGLOBIN AND HEMATOCRIT, BLOOD
HCT: 20.4 % — ABNORMAL LOW (ref 39.0–52.0)
Hemoglobin: 6.8 g/dL — ABNORMAL LOW (ref 13.0–17.0)

## 2020-01-01 LAB — BASIC METABOLIC PANEL
Anion gap: 14 (ref 5–15)
BUN: 82 mg/dL — ABNORMAL HIGH (ref 6–20)
CO2: 25 mmol/L (ref 22–32)
Calcium: 8.3 mg/dL — ABNORMAL LOW (ref 8.9–10.3)
Chloride: 95 mmol/L — ABNORMAL LOW (ref 98–111)
Creatinine, Ser: 15.62 mg/dL — ABNORMAL HIGH (ref 0.61–1.24)
GFR calc Af Amer: 4 mL/min — ABNORMAL LOW (ref >60)
GFR calc non Af Amer: 3 mL/min — ABNORMAL LOW (ref >60)
Glucose, Bld: 161 mg/dL — ABNORMAL HIGH (ref 70–99)
Potassium: 3.8 mmol/L (ref 3.5–5.1)
Sodium: 134 mmol/L — ABNORMAL LOW (ref 135–145)

## 2020-01-01 LAB — MRSA PCR SCREENING: MRSA by PCR: NEGATIVE

## 2020-01-01 LAB — PREPARE RBC (CROSSMATCH)

## 2020-01-01 LAB — TROPONIN I (HIGH SENSITIVITY): Troponin I (High Sensitivity): 54 ng/L — ABNORMAL HIGH (ref <18)

## 2020-01-01 SURGERY — ESOPHAGOGASTRODUODENOSCOPY (EGD) WITH PROPOFOL
Anesthesia: Monitor Anesthesia Care

## 2020-01-01 MED ORDER — PROMETHAZINE HCL 25 MG/ML IJ SOLN
25.0000 mg | Freq: Once | INTRAMUSCULAR | Status: AC
  Administered 2020-01-01: 25 mg via INTRAVENOUS
  Filled 2020-01-01: qty 1

## 2020-01-01 MED ORDER — EXTRANEAL 7.5 % IP SOLN
INTRAPERITONEAL | Status: DC
  Filled 2020-01-01: qty 2500

## 2020-01-01 MED ORDER — LIDOCAINE HCL (PF) 2 % IJ SOLN
INTRAMUSCULAR | Status: AC
  Filled 2020-01-01: qty 5

## 2020-01-01 MED ORDER — LORAZEPAM 2 MG/ML IJ SOLN
0.5000 mg | Freq: Once | INTRAMUSCULAR | Status: AC
  Administered 2020-01-01: 0.5 mg via INTRAVENOUS
  Filled 2020-01-01: qty 1

## 2020-01-01 MED ORDER — SODIUM CHLORIDE 0.9% IV SOLUTION: Freq: Once | INTRAVENOUS | Status: AC

## 2020-01-01 MED ORDER — DELFLEX-LC/2.5% DEXTROSE 394 MOSM/L IP SOLN
INTRAPERITONEAL | Status: DC
  Filled 2020-01-01 (×4): qty 3000

## 2020-01-01 MED ORDER — SODIUM CHLORIDE 0.9% IV SOLUTION: Freq: Once | INTRAVENOUS | Status: DC

## 2020-01-01 MED ORDER — NITROGLYCERIN 2 % TD OINT
0.5000 [in_us] | TOPICAL_OINTMENT | Freq: Once | TRANSDERMAL | Status: AC
  Administered 2020-01-01: 0.5 [in_us] via TOPICAL
  Filled 2020-01-01 (×2): qty 1

## 2020-01-01 MED ORDER — PHENYLEPHRINE HCL (PRESSORS) 10 MG/ML IV SOLN
INTRAVENOUS | Status: AC
  Filled 2020-01-01: qty 1

## 2020-01-01 MED ORDER — DELFLEX-LC/1.5% DEXTROSE 344 MOSM/L IP SOLN
INTRAPERITONEAL | Status: DC
  Filled 2020-01-01 (×4): qty 3000

## 2020-01-01 MED ORDER — INSULIN ASPART 100 UNIT/ML ~~LOC~~ SOLN
0.0000 [IU] | Freq: Three times a day (TID) | SUBCUTANEOUS | Status: DC
  Administered 2020-01-02: 5 [IU] via SUBCUTANEOUS
  Administered 2020-01-02: 3 [IU] via SUBCUTANEOUS
  Administered 2020-01-02: 1 [IU] via SUBCUTANEOUS
  Administered 2020-01-02 – 2020-01-03 (×2): 2 [IU] via SUBCUTANEOUS
  Administered 2020-01-03 (×2): 1 [IU] via SUBCUTANEOUS
  Administered 2020-01-04: 2 [IU] via SUBCUTANEOUS
  Filled 2020-01-01 (×7): qty 1

## 2020-01-01 MED ORDER — HYDRALAZINE HCL 20 MG/ML IJ SOLN
10.0000 mg | INTRAMUSCULAR | Status: DC | PRN
  Administered 2020-01-01 – 2020-01-02 (×3): 10 mg via INTRAVENOUS
  Filled 2020-01-01 (×3): qty 1

## 2020-01-01 MED ORDER — PROPOFOL 500 MG/50ML IV EMUL
INTRAVENOUS | Status: AC
  Filled 2020-01-01: qty 50

## 2020-01-01 MED ORDER — PROMETHAZINE HCL 25 MG/ML IJ SOLN
12.5000 mg | Freq: Once | INTRAMUSCULAR | Status: AC
  Administered 2020-01-01: 12.5 mg via INTRAVENOUS
  Filled 2020-01-01: qty 1

## 2020-01-01 MED ORDER — VALGANCICLOVIR HCL 50 MG/ML PO SOLR
200.0000 mg | ORAL | Status: DC
  Filled 2020-01-01: qty 4

## 2020-01-01 MED ORDER — GENTAMICIN SULFATE 0.1 % EX CREA
1.0000 "application " | TOPICAL_CREAM | Freq: Every day | CUTANEOUS | Status: DC
  Filled 2020-01-01: qty 15

## 2020-01-01 MED ORDER — NITROGLYCERIN 2 % TD OINT
0.5000 [in_us] | TOPICAL_OINTMENT | Freq: Once | TRANSDERMAL | Status: AC
  Administered 2020-01-01: 0.5 [in_us] via TOPICAL
  Filled 2020-01-01: qty 1

## 2020-01-01 MED ORDER — DELFLEX-LC/2.5% DEXTROSE 394 MOSM/L IP SOLN
INTRAPERITONEAL | Status: DC
  Filled 2020-01-01: qty 3000

## 2020-01-01 MED ORDER — VALGANCICLOVIR HCL 50 MG/ML PO SOLR: 200.0000 mg | ORAL | Status: DC

## 2020-01-01 MED ORDER — GENTAMICIN SULFATE 0.1 % EX CREA
1.0000 "application " | TOPICAL_CREAM | Freq: Every day | CUTANEOUS | Status: DC
  Administered 2020-01-03 – 2020-01-04 (×2): 1 via TOPICAL
  Filled 2020-01-01: qty 15

## 2020-01-01 NOTE — Progress Notes (Signed)
Patients HR felt irregular, Dr. Alfredia Ferguson notified new order for Telemetry and EKG, awaiting EKG machine and Tele box , none on floor at this time. Blood is infusing at this time. No further N/V at this time.

## 2020-01-01 NOTE — Progress Notes (Signed)
Patient had order to transfuse 1 pack RBC's this writer went to get them and then prepared them when I logged back into computer they had discontinued order. Attempt to return to blood bank they state they couldn't take it because it had been spiked. Wasted , and ADON Arboriculturist made aware

## 2020-01-01 NOTE — Progress Notes (Signed)
Peritoneal dialysis patent known at [facility]. Pleas contact me with any dialysis placement concerns.  Elvera Bicker Dialysis Coordinator [phone]

## 2020-01-01 NOTE — Plan of Care (Signed)
  Problem: Health Behavior/Discharge Planning: Goal: Ability to manage health-related needs will improve Outcome: Progressing   

## 2020-01-01 NOTE — Progress Notes (Addendum)
[facility]  ROUNDING NOTE   Subjective:   [name] admitted to [facility] on 12/31/2019 for Hematemesis [K92.0] Acute upper GI bleed [K92.2]  Patient has PRBC transfusion in process.  GI team planning for EGD today.  Objective:  Vital signs in last 24 hours:  Temp:  [97.8 F (36.6 C)-99.1 F (37.3 C)] 98.7 F (37.1 C) (09/23 1210) Pulse Rate:  [70-97] 82 (09/23 1210) Resp:  [14-20] 14 (09/23 0928) BP: (112-138)/(45-92) 112/65 (09/23 1210) SpO2:  [95 %-100 %] 100 % (09/23 1210) Weight:  [105.6 kg-107.2 kg] 107.2 kg (09/22 2040)  Weight change:  Filed Weights   12/31/19 1238 12/31/19 2040  Weight: 105.6 kg 107.2 kg    Intake/Output: I/O last 3 completed shifts: In: 380 [Blood:380] Out: -    Intake/Output this shift:  No intake/output data recorded.  Physical Exam:   General: In no acute distress Head:Normocephalic,Atraumatic Cardiac: S1/S2, RRR, No murmurs, No gallops or rubs. Respiratory: Clear bilaterally. No rales, wheezing, rhonchi or rubs. Abdomen: Soft, nondistended, nontender, Musculoskeletal: No pitting leg edema bilaterally. 2+DP/PT pulse bilaterally. Skin: No acute  rashes.  Neuro: Alert, oriented X3,  Access: Peritoneal cathter rt.abdomen with dressing clean ,dry and intact.LUA AVF  Basic Metabolic Panel: Recent Labs  Lab 12/31/19 1248 01/01/20 0406  NA 131* 134*  K 4.1 3.8  CL 94* 95*  CO2 26 25  GLUCOSE 311* 161*  BUN 71* 82*  CREATININE 14.53* 15.62*  CALCIUM 7.9* 8.3*    Liver Function Tests: No results for input(s): AST, ALT, ALKPHOS, BILITOT, PROT, ALBUMIN in the last 168 hours. No results for input(s): LIPASE, AMYLASE in the last 168 hours. No results for input(s): AMMONIA in the last 168 hours.  CBC: Recent Labs  Lab 12/31/19 1248 12/31/19 2235 01/01/20 0406 01/01/20 0831  WBC 5.6 8.4 7.4 7.0  HGB 6.1* 6.5* 6.2* 6.3*  HCT 18.3* 19.2* 17.3* 18.5*  MCV 93.8 91.9 89.2 90.7  PLT 207 222 210 206    Cardiac  Enzymes: No results for input(s): CKTOTAL, CKMB, CKMBINDEX, TROPONINI in the last 168 hours.  BNP: Invalid input(s): POCBNP  CBG: Recent Labs  Lab 12/31/19 1634 12/31/19 2053 01/01/20 0726 01/01/20 1143  GLUCAP 223* 201* 163* 152*    Microbiology: Results for orders placed or performed during the hospital encounter of 12/31/19  SARS Coronavirus 2 by RT PCR (hospital order, performed in [facility] hospital lab) Nasopharyngeal Nasopharyngeal Swab     Status: None   Collection Time: 12/31/19  2:42 PM   Specimen: Nasopharyngeal Swab  Result Value Ref Range Status   SARS Coronavirus 2 NEGATIVE NEGATIVE Final    Comment: (NOTE) SARS-CoV-2 target nucleic acids are NOT DETECTED.  The SARS-CoV-2 RNA is generally detectable in upper and lower respiratory specimens during the acute phase of infection. The lowest concentration of SARS-CoV-2 viral copies this assay can detect is 250 copies / mL. A negative result does not preclude SARS-CoV-2 infection and should not be used as the sole basis for treatment or other patient management decisions.  A negative result may occur with improper specimen collection / handling, submission of specimen other than nasopharyngeal swab, presence of viral mutation(s) within the areas targeted by this assay, and inadequate number of viral copies (<250 copies / mL). A negative result must be combined with clinical observations, patient history, and epidemiological information.  Fact Sheet for Patients:   StrictlyIdeas.no  Fact Sheet for Healthcare Providers: BankingDealers.co.za  This test is not yet approved or  cleared  by the [place] and has been authorized for detection and/or diagnosis of SARS-CoV-2 by FDA under an Emergency Use Authorization (EUA).  This EUA will remain in effect (meaning this test can be used) for the duration of the COVID-19 declaration under Section 564(b)(1) of the  Act, 21 U.S.C. section 360bbb-3(b)(1), unless the authorization is terminated or revoked sooner.  Performed at [facility], [address] [place], [place] [address]   MRSA PCR Screening     Status: None   Collection Time: 01/01/20  4:19 AM   Specimen: Nasopharyngeal  Result Value Ref Range Status   MRSA by PCR NEGATIVE NEGATIVE Final    Comment:        The GeneXpert MRSA Assay (FDA approved for NASAL specimens only), is one component of a comprehensive MRSA colonization surveillance program. It is not intended to diagnose MRSA infection nor to guide or monitor treatment for MRSA infections. Performed at [facility], [address] [place],  [address]     Coagulation Studies: Recent Labs    12/31/19 1540  LABPROT 14.5  INR 1.2    Urinalysis: No results for input(s): COLORURINE, LABSPEC, PHURINE, GLUCOSEU, HGBUR, BILIRUBINUR, KETONESUR, PROTEINUR, UROBILINOGEN, NITRITE, LEUKOCYTESUR in the last 72 hours.  Invalid input(s): APPERANCEUR    Imaging: No results found.   Medications:    sodium chloride Stopped (12/31/19 1613)   sodium chloride Stopped (01/01/20 0917)    carvedilol  12.5 mg Oral BID WC   cinacalcet  30 mg Oral Q breakfast   insulin aspart  0-5 Units Subcutaneous QHS   insulin aspart  0-9 Units Subcutaneous TID WC   insulin glargine  10 Units Subcutaneous Daily   NIFEdipine  60 mg Oral Daily   pantoprazole (PROTONIX) IV  40 mg Intravenous Q12H   paricalcitol  3 mcg Oral Daily   rosuvastatin  40 mg Oral Daily   senna  1 tablet Oral Daily   sucroferric oxyhydroxide  500 mg Oral TID WC   acetaminophen, hydrALAZINE, ondansetron (ZOFRAN) IV  Assessment/ Plan:  [name] is a 49 y.o. black male with end stage renal disease on peritoneal dialysis, peptic ulcer disease, diabetes mellitus type II insulin dependent, hypertension, coronary artery disease status post CABG, who is admitted to [facility] on 12/31/2019 for  Hematemesis [K92.0] Acute upper GI bleed [K92.2]  [facility] 113kg left AVF CCPD 10 hours 5 cycles 2971mL with last fill 2025mL extraneal.   1. End Stage Renal Disease: on peritoneal dialysis.  - Resume peritoneal dialysis tonight -Orders placed  2. Anemia with chronic kidney disease: with recent peptic ulcers With GI bleed - Plan EGD today  3. Hypertension BP readings within acceptable range today,on Carvedilol and Nifedipine  4. Secondary Hyperparathyroidism: outpatient labs on 9/16: PTH elevated 1011. Phos 5.1, calcium 8.2 - Auryxia with meals.  - cinacalcet - paricalcitol   LOS: 0 Princy Raju 9/23/202112:13 PM  Patient was seen and examined with Crosby Oyster, DNP. Peritoneal dialysis for tonight. Orders prepared.  Appreciate GI input.  Above plan was discussed and agreed upon on the signing of this note.   Lavonia Dana, [facility]  9/23/20212:13 PM

## 2020-01-01 NOTE — Progress Notes (Signed)
GI Inpatient Follow-up Note  Subjective:  Patient states he is feeling well. No melena or hematemesis.  Scheduled Inpatient Medications:  . sodium chloride   Intravenous Once  . carvedilol  12.5 mg Oral BID WC  . cinacalcet  30 mg Oral Q breakfast  . extraneal (ICODEXTRIN) peritoneal dialysis solution   Intraperitoneal Q24H  . gentamicin cream  1 application Topical Daily  . insulin aspart  0-5 Units Subcutaneous QHS  . insulin aspart  0-9 Units Subcutaneous TID WC  . insulin glargine  10 Units Subcutaneous Daily  . NIFEdipine  60 mg Oral Daily  . pantoprazole (PROTONIX) IV  40 mg Intravenous Q12H  . paricalcitol  3 mcg Oral Daily  . rosuvastatin  40 mg Oral Daily  . senna  1 tablet Oral Daily  . sucroferric oxyhydroxide  500 mg Oral TID WC  . valganciclovir  200 mg Oral Once per day on Mon Thu    Continuous Inpatient Infusions:   . sodium chloride Stopped (12/31/19 1613)  . sodium chloride Stopped (01/01/20 0917)  . dialysis solution 2.5% low-MG/low-CA      PRN Inpatient Medications:  acetaminophen, hydrALAZINE, ondansetron (ZOFRAN) IV  Review of Systems: Constitutional: Weight is stable.  Eyes: No changes in vision. ENT: moist mucous membranes GI: see HPI.  Heme/Lymph: No easy bruising.  CV: No chest pain.  GU: No dysuria Integumentary: No rashes.  Neuro: No headaches.  Psych: No depression/anxiety.  Endocrine: No heat/cold intolerance.  Allergic/Immunologic: No rashes Resp: No cough, SOB.  Musculoskeletal: No joint swelling.    Physical Examination: BP (!) 147/81 (BP Location: Right Arm)   Pulse 85   Temp 98.6 F (37 C) (Oral)   Resp 18   Ht 5\' 10"  (1.778 m)   Wt 107.2 kg   SpO2 100%   BMI 33.91 kg/m  Gen: NAD, alert and oriented x 4 HEENT: PEERLA, EOMI, Neck: supple, no JVD Chest: No respiratory distress Abd: soft, NT, ND Ext: no edema, well perfused with 2+ pulses, Skin: no rash or lesions noted Lymph: no lymphadenopathy  Data: Lab Results   Component Value Date   WBC 7.0 01/01/2020   HGB 6.8 (L) 01/01/2020   HCT 20.4 (L) 01/01/2020   MCV 90.7 01/01/2020   PLT 206 01/01/2020   Recent Labs  Lab 01/01/20 0406 01/01/20 0831 01/01/20 1232  HGB 6.2* 6.3* 6.8*   Lab Results  Component Value Date   NA 134 (L) 01/01/2020   K 3.8 01/01/2020   CL 95 (L) 01/01/2020   CO2 25 01/01/2020   BUN 82 (H) 01/01/2020   CREATININE 15.62 (H) 01/01/2020   No results found for: ALT, AST, GGT, ALKPHOS, BILITOT Recent Labs  Lab 12/31/19 1540  APTT 32  INR 1.2   Assessment/Plan: Mr. Hoard is a 49 y.o. gentleman with history of ESRD and CMV esophagitis diagnoses one week ago who presented with hematemesis that has resolved and currently hemodynamically stable. Planned for EGD today but hemoglobin was < 7, will plan on EGD tomorrow. Discussed with medical team and patient will get dialysis tonight and recheck CBC in the morning.  Recommendations:  - continue PPI IV BID - ok to eat right now but make NPO at midnight - if hemoglobin is < 7 in am, please transfuse - plan for EGD tomorrow  Please call with any questions or concerns.  Raylene Miyamoto MD, MPH [facility]

## 2020-01-01 NOTE — Progress Notes (Signed)
PROGRESS NOTE    Donald [name]  XAJ:[id number] DOB: Jan 07, 1971 DOA: 12/31/2019 PCP: Patient, [name]   Brief Narrative:  HPI per Dr. Ivor Costa on 12/31/19  HPI: Donald [name] is a 49 y.o. male with medical history significant of ESRD on peritoneal dialysis, HTN, HLD, DM, esophageal ulcer, who presents with hematemesis.  Pt states that he he has had several episodes of vomiting blood with dark-colored blood since this morning.  Patient had another episode of hematemesis in the ED with approximate 400cc vomitus.  Denies abdominal pain or bloody stool.  Patient has mild cough, no shortness breath or chest pain.  Patient has dizziness and generalized weakness.  No symptoms of UTI or unilateral weakness. Of note, pt states that he had EGD recently at [facility] in [place] where he was found to have esophageal ulcers that were biopsied and he was diagnosed with CMV. Pt is taking ASA 81 mg daily. Not taking NSAID.   ED Course: pt was found to have hemoglobin 6.1 (8. 6 on 07/16/2019), pending COVID-19 PCR, potassium 4.1, bicarbonate 26, creatinine 14.5, BUN 71.  Temperature normal.  Blood pressure 127/55, heart rate 88, RR 16, oxygen saturation 100%.  Patient is placed on MedSurg bed of observation.  Check talked Locklear is consulted.   **Interim History Patient's hemoglobin still was not above 7 and was will be typed and screened and transfused 1 more unit but GI recommended holding against this and recommended getting peritoneal dialysis tonight and repeating blood count in the morning and then transfusing at that time.  Anesthesia would not take him today for his EGD with a hemoglobin less than 7 so GI recommended feeding the patient tonight and watching carefully.  Nephrology planning on doing peritoneal dialysis cycle tonight..  Assessment & Plan:   Principal Problem:   Hematemesis Active Problems:   Type II diabetes mellitus with renal manifestations ([place])   Hypertension    ESRD on peritoneal dialysis ([place])   HLD (hyperlipidemia)   GI bleed   Hematemesis in the setting of likely esophageal ulcer -Patient's Hgb 8.6 -->6.1. Dr. Haig Prophet of is consulted pain on an EGD in the a.m. -He was placed in med-surg bed obs but will change to inpatient - hold ASA - transfuse 1 units of blood yesterday and 1 unit this morning; repeat hemoglobin/hematocrit after was 6.8/order for another 1 unit but GI recommended a gastric given his concern for volume overload - GI consulted by Ed, will follow up recommendations - NPO after MN for EGD  - IVF: 50 mL/hr of NS stopped - Start IV pantoprazole 40 mg bid and will continue -Continue with Vantin ganciclovir 200 mg p.o. every Tuesday and Friday for history of CMV  - Zofran IV for nausea - Avoid NSAIDs and SQ heparin - Maintain IV access (2 large bore IVs if possible). - Monitor closely and follow q6h cbc, transfuse as necessary, if Hgb<7.0 - LaB: INR, PTT and type screen; PT was 14.5 and INR 1.2 -Continue monitor CBCs every 6 hours and continue monitor for signs of bleeding; because he is not actively bleeding currently GI recommending resuming his renal diet -We will continue to monitor carefully and repeat his CBC in the morning if is still low will give him additional 1 unit after his peritoneal dialysis tonight  Type II diabetes mellitus with renal manifestations (H[facility]) -Recent A1c 6.1, well controlled.  Patient is taking NovoLog and Lantus at home -Continue with sensitive NovoLog/scale insulin before meals and at  bedtime -Decreased Lantus from 15 to 10 units daily -CBGs are ranging from 152-223  Hypertension -IV hydralazine 5 mg every 2 hours as needed for high blood pressure for systolic blood pressure of 165 as needed -Continue home Carvedilol 12.5 mg p.o. twice daily, Nifedipine 60 mg p.o. daily -Continue to monitor blood pressures per protocol -Last blood pressure was 147/81  ESRD on peritoneal dialysis  (HCC) -Dr. Juleen China of renal is consulted for peritoneal dialysis and he will be getting peritoneal dialysis today -Patient's hemoglobin/macula from 71/40.5 today is now 82/15.62 -Continue with paricalcitol 3 mcg p.o. daily as well as VELPHORO 500 mg p.o. 3 times daily and cinacalcet 30 mg p.o. daily with breakfast  HLD (Hyperlipidemia) -C/w Rosuvastatin 40 mg po Daily  Obesity -Complicates his prognosis and care -Estimated body mass index is 33.91 kg/m as calculated from the following:   Height as of this encounter: 5\' 10"  (1.778 m).   Weight as of this encounter: 107.2 kg. -Weight loss and Dietary Counseling given   DVT prophylaxis: SCDs Code Status: FULL CODE  Family Communication: No family present at bedside  Disposition Plan: Pending further GI work-up and clearance as well as clearance by nephrology  Status is: Inpatient  Remains inpatient appropriate because:Ongoing diagnostic testing needed not appropriate for outpatient work up, Unsafe d/c plan, IV treatments appropriate due to intensity of illness or inability to take PO and Inpatient level of care appropriate due to severity of illness   Dispo: The patient is from: Home              Anticipated d/c is to: TBD              Anticipated d/c date is: 2 days              Patient currently is not medically stable to d/c.  Consultants:   Gastroenterology  Nephrology   Procedures: EGD to be done in the AM; Peritoneal Dialysis   Antimicrobials:  Anti-infectives (From admission, onward)   Start     Dose/Rate Route Frequency Ordered Stop   01/06/20 1000  valganciclovir (VALCYTE) 50 MG/ML oral solution 200 mg       Note to Pharmacy: Tuesday and Friday     200 mg Oral Every Tue 01/01/20 1527     01/02/20 1000  valganciclovir (VALCYTE) 50 MG/ML oral solution 200 mg        200 mg Oral Every Fri 01/01/20 1535          Subjective: Seen and examined at bedside and he was a little hungry.  No nausea or vomiting.  Denies  any pain at this time.  Understands that he is going to go for an EGD.  No other concerns or complaints at this time but wanted to rest.  Objective: Vitals:   01/01/20 0902 01/01/20 0928 01/01/20 1210 01/01/20 1418  BP: 128/69 132/71 112/65 (!) 147/81  Pulse: 92 95 82 85  Resp: 14 14  18   Temp: 98.2 F (36.8 C) 98.2 F (36.8 C) 98.7 F (37.1 C) 98.6 F (37 C)  TempSrc: Oral Oral Oral Oral  SpO2: 96% 95% 100% 100%  Weight:      Height:        Intake/Output Summary (Last 24 hours) at 01/01/2020 1755 Last data filed at 01/01/2020 1700 Gross per 24 hour  Intake 15228 ml  Output --  Net 15228 ml   Filed Weights   12/31/19 1238 12/31/19 2040  Weight: 105.6 kg 107.2 kg  Examination: Physical Exam:  Constitutional: WN/WD obese African-American male currently in NAD and appears calm and comfortable Eyes: Lids and conjunctivae normal, sclerae anicteric  ENMT: External Ears, Nose appear normal. Grossly normal hearing.  Neck: Appears normal, supple, no cervical masses, normal ROM, no appreciable thyromegaly medical no JVD Respiratory: Diminished to auscultation bilaterally, no wheezing, rales, rhonchi or crackles. Normal respiratory effort and patient is not tachypenic. No accessory muscle use.  Unlabored breathing Cardiovascular: RRR, no murmurs / rubs / gallops. S1 and S2 auscultated.  Has some lower extremity edema Abdomen: Soft, non-tender, distended secondary body habitus. Bowel sounds positive.  GU: Deferred. Musculoskeletal: No clubbing / cyanosis of digits/nails. No joint deformity upper and lower extremities.  Skin: No rashes, lesions, ulcers on limited skin evaluation. No induration; Warm and dry.  Neurologic: CN 2-12 grossly intact with no focal deficits. Romberg sign and cerebellar reflexes not assessed.  Psychiatric: Normal judgment and insight. Alert and oriented x 3.  Slightly sleepy mood and appropriate affect.   Data Reviewed: I have personally reviewed following  labs and imaging studies  CBC: Recent Labs  Lab 12/31/19 1248 12/31/19 2235 01/01/20 0406 01/01/20 0831 01/01/20 1232  WBC 5.6 8.4 7.4 7.0  --   HGB 6.1* 6.5* 6.2* 6.3* 6.8*  HCT 18.3* 19.2* 17.3* 18.5* 20.4*  MCV 93.8 91.9 89.2 90.7  --   PLT 207 222 210 206  --    Basic Metabolic Panel: Recent Labs  Lab 12/31/19 1248 01/01/20 0406  NA 131* 134*  K 4.1 3.8  CL 94* 95*  CO2 26 25  GLUCOSE 311* 161*  BUN 71* 82*  CREATININE 14.53* 15.62*  CALCIUM 7.9* 8.3*   GFR: Estimated Creatinine Clearance: 7.1 mL/min (A) (by C-G formula based on SCr of 15.62 mg/dL (H)). Liver Function Tests: No results for input(s): AST, ALT, ALKPHOS, BILITOT, PROT, ALBUMIN in the last 168 hours. No results for input(s): LIPASE, AMYLASE in the last 168 hours. No results for input(s): AMMONIA in the last 168 hours. Coagulation Profile: Recent Labs  Lab 12/31/19 1540  INR 1.2   Cardiac Enzymes: No results for input(s): CKTOTAL, CKMB, CKMBINDEX, TROPONINI in the last 168 hours. BNP (last 3 results) No results for input(s): PROBNP in the last 8760 hours. HbA1C: No results for input(s): HGBA1C in the last 72 hours. CBG: Recent Labs  Lab 12/31/19 1634 12/31/19 2053 01/01/20 0726 01/01/20 1143 01/01/20 1609  GLUCAP 223* 201* 163* 152* 162*   Lipid Profile: No results for input(s): CHOL, HDL, LDLCALC, TRIG, CHOLHDL, LDLDIRECT in the last 72 hours. Thyroid Function Tests: No results for input(s): TSH, T4TOTAL, FREET4, T3FREE, THYROIDAB in the last 72 hours. Anemia Panel: No results for input(s): VITAMINB12, FOLATE, FERRITIN, TIBC, IRON, RETICCTPCT in the last 72 hours. Sepsis Labs: No results for input(s): PROCALCITON, LATICACIDVEN in the last 168 hours.  Recent Results (from the past 240 hour(s))  SARS Coronavirus 2 by RT PCR (hospital order, performed in [facility] hospital lab) Nasopharyngeal Nasopharyngeal Swab     Status: None   Collection Time: 12/31/19  2:42 PM   Specimen:  Nasopharyngeal Swab  Result Value Ref Range Status   SARS Coronavirus 2 NEGATIVE NEGATIVE Final    Comment: (NOTE) SARS-CoV-2 target nucleic acids are NOT DETECTED.  The SARS-CoV-2 RNA is generally detectable in upper and lower respiratory specimens during the acute phase of infection. The lowest concentration of SARS-CoV-2 viral copies this assay can detect is 250 copies / mL. A negative result does not preclude SARS-CoV-2  infection and should not be used as the sole basis for treatment or other patient management decisions.  A negative result may occur with improper specimen collection / handling, submission of specimen other than nasopharyngeal swab, presence of viral mutation(s) within the areas targeted by this assay, and inadequate number of viral copies (<250 copies / mL). A negative result must be combined with clinical observations, patient history, and epidemiological information.  Fact Sheet for Patients:   StrictlyIdeas.no  Fact Sheet for Healthcare Providers: BankingDealers.co.za  This test is not yet approved or  cleared by the [place] FDA and has been authorized for detection and/or diagnosis of SARS-CoV-2 by FDA under an Emergency Use Authorization (EUA).  This EUA will remain in effect (meaning this test can be used) for the duration of the COVID-19 declaration under Section 564(b)(1) of the Act, 21 U.S.C. section 360bbb-3(b)(1), unless the authorization is terminated or revoked sooner.  Performed at [facility], [address] [place], [place] [address]   MRSA PCR Screening     Status: None   Collection Time: 01/01/20  4:19 AM   Specimen: Nasopharyngeal  Result Value Ref Range Status   MRSA by PCR NEGATIVE NEGATIVE Final    Comment:        The GeneXpert MRSA Assay (FDA approved for NASAL specimens only), is one component of a comprehensive MRSA colonization surveillance program. It is  not intended to diagnose MRSA infection nor to guide or monitor treatment for MRSA infections. Performed at [facility], [address] [place], [place] [address]      RN Pressure Injury Documentation:     Estimated body mass index is 33.91 kg/m as calculated from the following:   Height as of this encounter: 5\' 10"  (1.778 m).   Weight as of this encounter: 107.2 kg.  Malnutrition Type:      Malnutrition Characteristics:      Nutrition Interventions:     Radiology Studies: No results found.  Scheduled Meds: . sodium chloride   Intravenous Once  . carvedilol  12.5 mg Oral BID WC  . cinacalcet  30 mg Oral Q breakfast  . gentamicin cream  1 application Topical Daily  . insulin aspart  0-5 Units Subcutaneous QHS  . insulin aspart  0-9 Units Subcutaneous TID WC  . insulin glargine  10 Units Subcutaneous Daily  . NIFEdipine  60 mg Oral Daily  . pantoprazole (PROTONIX) IV  40 mg Intravenous Q12H  . paricalcitol  3 mcg Oral Daily  . rosuvastatin  40 mg Oral Daily  . senna  1 tablet Oral Daily  . sucroferric oxyhydroxide  500 mg Oral TID WC  . [START ON 01/06/2020] valganciclovir  200 mg Oral Q Tue  . [START ON 01/02/2020] valganciclovir  200 mg Oral Q Fri   Continuous Infusions: . sodium chloride Stopped (12/31/19 1613)  . sodium chloride Stopped (01/01/20 0917)  . dialysis solution 1.5% low-MG/low-CA    . dialysis solution 2.5% low-MG/low-CA      LOS: 0 days   Kerney Elbe, DO [facility] PAGER is on [place]  If 7PM-7AM, please contact night-coverage [email or web address]

## 2020-01-01 NOTE — Progress Notes (Signed)
New order promethazine (PHENERGAN) injection 12.5 mg.

## 2020-01-01 NOTE — Progress Notes (Signed)
   01/01/20 0902  Vitals  Temp 98.2 F (36.8 C)  Temp Source Oral  BP 128/69  MAP (mmHg) 85  BP Location Right Arm  BP Method Automatic  Patient Position (if appropriate) Lying  Pulse Rate 92  Resp 14  MEWS COLOR  MEWS Score Color Green  Oxygen Therapy  SpO2 96 %  O2 Device Room Air  MEWS Score  MEWS Temp 0  MEWS Systolic 0  MEWS Pulse 0  MEWS RR 0  MEWS LOC 0  MEWS Score 0

## 2020-01-01 NOTE — Progress Notes (Signed)
Patient having episodes of N/V with prn zofran ineffective. NP B. Randol Kern notified, awaiting response.

## 2020-01-01 NOTE — Progress Notes (Signed)
Patient has nausea and vomiting with prn zofran ineffective. NP notified, new order phenergan to be administered.

## 2020-01-02 ENCOUNTER — Inpatient Hospital Stay
Admission: EM | Disposition: A | Discharge status: Home / Self Care | Payer: Self-pay | Source: Home / Self Care | Attending: Internal Medicine

## 2020-01-02 ENCOUNTER — Encounter: Payer: Self-pay | Admitting: Gastroenterology

## 2020-01-02 ENCOUNTER — Inpatient Hospital Stay: Payer: Medicare Other | Admitting: Certified Registered Nurse Anesthetist

## 2020-01-02 DIAGNOSIS — N186 End stage renal disease: Secondary | ICD-10-CM

## 2020-01-02 DIAGNOSIS — K922 Gastrointestinal hemorrhage, unspecified: Secondary | ICD-10-CM

## 2020-01-02 DIAGNOSIS — K208 Other esophagitis without bleeding: Secondary | ICD-10-CM

## 2020-01-02 DIAGNOSIS — I160 Hypertensive urgency: Secondary | ICD-10-CM

## 2020-01-02 DIAGNOSIS — I248 Other forms of acute ischemic heart disease: Secondary | ICD-10-CM

## 2020-01-02 DIAGNOSIS — E1122 Type 2 diabetes mellitus with diabetic chronic kidney disease: Secondary | ICD-10-CM

## 2020-01-02 DIAGNOSIS — Z794 Long term (current) use of insulin: Secondary | ICD-10-CM

## 2020-01-02 DIAGNOSIS — K920 Hematemesis: Secondary | ICD-10-CM

## 2020-01-02 DIAGNOSIS — R112 Nausea with vomiting, unspecified: Secondary | ICD-10-CM

## 2020-01-02 DIAGNOSIS — I25118 Atherosclerotic heart disease of native coronary artery with other forms of angina pectoris: Secondary | ICD-10-CM

## 2020-01-02 DIAGNOSIS — Z992 Dependence on renal dialysis: Secondary | ICD-10-CM

## 2020-01-02 DIAGNOSIS — B259 Cytomegaloviral disease, unspecified: Secondary | ICD-10-CM

## 2020-01-02 DIAGNOSIS — N185 Chronic kidney disease, stage 5: Secondary | ICD-10-CM

## 2020-01-02 HISTORY — PX: ESOPHAGOGASTRODUODENOSCOPY (EGD) WITH PROPOFOL: SHX5813

## 2020-01-02 LAB — TROPONIN I (HIGH SENSITIVITY)
Troponin I (High Sensitivity): 396 ng/L (ref <18)
Troponin I (High Sensitivity): 790 ng/L (ref <18)

## 2020-01-02 LAB — GLUCOSE, CAPILLARY
Glucose-Capillary: 266 mg/dL — ABNORMAL HIGH (ref 70–99)
Glucose-Capillary: 202 mg/dL — ABNORMAL HIGH (ref 70–99)
Glucose-Capillary: 152 mg/dL — ABNORMAL HIGH (ref 70–99)
Glucose-Capillary: 123 mg/dL — ABNORMAL HIGH (ref 70–99)
Glucose-Capillary: 180 mg/dL — ABNORMAL HIGH (ref 70–99)

## 2020-01-02 LAB — COMPREHENSIVE METABOLIC PANEL
ALT: 14 U/L (ref 0–44)
AST: 12 U/L — ABNORMAL LOW (ref 15–41)
Albumin: 2.7 g/dL — ABNORMAL LOW (ref 3.5–5.0)
Alkaline Phosphatase: 48 U/L (ref 38–126)
Anion gap: 14 (ref 5–15)
BUN: 78 mg/dL — ABNORMAL HIGH (ref 6–20)
CO2: 25 mmol/L (ref 22–32)
Calcium: 8.7 mg/dL — ABNORMAL LOW (ref 8.9–10.3)
Chloride: 93 mmol/L — ABNORMAL LOW (ref 98–111)
Creatinine, Ser: 15.7 mg/dL — ABNORMAL HIGH (ref 0.61–1.24)
GFR calc Af Amer: 4 mL/min — ABNORMAL LOW (ref >60)
GFR calc non Af Amer: 3 mL/min — ABNORMAL LOW (ref >60)
Glucose, Bld: 331 mg/dL — ABNORMAL HIGH (ref 70–99)
Potassium: 3.7 mmol/L (ref 3.5–5.1)
Sodium: 132 mmol/L — ABNORMAL LOW (ref 135–145)
Total Bilirubin: 0.7 mg/dL (ref 0.3–1.2)
Total Protein: 5.8 g/dL — ABNORMAL LOW (ref 6.5–8.1)

## 2020-01-02 LAB — CBC WITH DIFFERENTIAL/PLATELET
Abs Immature Granulocytes: 0.07 10*3/uL (ref 0.00–0.07)
Basophils Absolute: 0 10*3/uL (ref 0.0–0.1)
Basophils Relative: 0 %
Eosinophils Absolute: 0 10*3/uL (ref 0.0–0.5)
Eosinophils Relative: 1 %
HCT: 19.9 % — ABNORMAL LOW (ref 39.0–52.0)
Hemoglobin: 6.6 g/dL — ABNORMAL LOW (ref 13.0–17.0)
Immature Granulocytes: 1 %
Lymphocytes Relative: 14 %
Lymphs Abs: 0.8 10*3/uL (ref 0.7–4.0)
MCH: 30 pg (ref 26.0–34.0)
MCHC: 33.2 g/dL (ref 30.0–36.0)
MCV: 90.5 fL (ref 80.0–100.0)
Monocytes Absolute: 0.4 10*3/uL (ref 0.1–1.0)
Monocytes Relative: 7 %
Neutro Abs: 4.6 10*3/uL (ref 1.7–7.7)
Neutrophils Relative %: 77 %
Platelets: 202 10*3/uL (ref 150–400)
RBC: 2.2 MIL/uL — ABNORMAL LOW (ref 4.22–5.81)
RDW: 16.8 % — ABNORMAL HIGH (ref 11.5–15.5)
WBC: 6 10*3/uL (ref 4.0–10.5)
nRBC: 0 % (ref 0.0–0.2)

## 2020-01-02 LAB — HEMOGLOBIN AND HEMATOCRIT, BLOOD
HCT: 21.2 % — ABNORMAL LOW (ref 39.0–52.0)
Hemoglobin: 7.3 g/dL — ABNORMAL LOW (ref 13.0–17.0)

## 2020-01-02 LAB — MAGNESIUM: Magnesium: 2.2 mg/dL (ref 1.7–2.4)

## 2020-01-02 LAB — PHOSPHORUS: Phosphorus: 4.6 mg/dL (ref 2.5–4.6)

## 2020-01-02 LAB — PREPARE RBC (CROSSMATCH)

## 2020-01-02 SURGERY — ESOPHAGOGASTRODUODENOSCOPY (EGD) WITH PROPOFOL
Anesthesia: General

## 2020-01-02 MED ORDER — ROSUVASTATIN CALCIUM 10 MG PO TABS
10.0000 mg | ORAL_TABLET | Freq: Every day | ORAL | Status: DC
  Administered 2020-01-02 – 2020-01-03 (×2): 10 mg via ORAL
  Filled 2020-01-02 (×2): qty 1

## 2020-01-02 MED ORDER — SODIUM CHLORIDE 0.9 % IV SOLN: INTRAVENOUS | Status: DC

## 2020-01-02 MED ORDER — EPHEDRINE SULFATE 50 MG/ML IJ SOLN
INTRAMUSCULAR | Status: DC | PRN
  Administered 2020-01-02: 10 mg via INTRAVENOUS

## 2020-01-02 MED ORDER — SODIUM CHLORIDE 0.9% IV SOLUTION: Freq: Once | INTRAVENOUS | Status: DC

## 2020-01-02 MED ORDER — LIDOCAINE HCL (CARDIAC) PF 100 MG/5ML IV SOSY: PREFILLED_SYRINGE | INTRAVENOUS | Status: DC | PRN

## 2020-01-02 MED ORDER — PROCHLORPERAZINE EDISYLATE 10 MG/2ML IJ SOLN
10.0000 mg | INTRAMUSCULAR | Status: DC | PRN
  Administered 2020-01-02 – 2020-01-04 (×3): 10 mg via INTRAVENOUS
  Filled 2020-01-02 (×4): qty 2

## 2020-01-02 MED ORDER — ROCURONIUM BROMIDE 100 MG/10ML IV SOLN
INTRAVENOUS | Status: DC | PRN
  Administered 2020-01-02: 10 mg via INTRAVENOUS

## 2020-01-02 MED ORDER — PHENOL 1.4 % MT LIQD
1.0000 | OROMUCOSAL | Status: DC | PRN
  Administered 2020-01-02: 1 via OROMUCOSAL
  Filled 2020-01-02: qty 177

## 2020-01-02 MED ORDER — SUGAMMADEX SODIUM 200 MG/2ML IV SOLN
INTRAVENOUS | Status: DC | PRN
  Administered 2020-01-02: 200 mg via INTRAVENOUS
  Administered 2020-01-02: 100 mg via INTRAVENOUS

## 2020-01-02 MED ORDER — SUCCINYLCHOLINE CHLORIDE 20 MG/ML IJ SOLN
INTRAMUSCULAR | Status: DC | PRN
  Administered 2020-01-02: 120 mg via INTRAVENOUS

## 2020-01-02 MED ORDER — PROPOFOL 10 MG/ML IV BOLUS
INTRAVENOUS | Status: DC | PRN
  Administered 2020-01-02: 120 mg via INTRAVENOUS
  Administered 2020-01-02: 40 mg via INTRAVENOUS
  Administered 2020-01-02: 140 mg via INTRAVENOUS

## 2020-01-02 MED ORDER — SUCRALFATE 1 GM/10ML PO SUSP
1.0000 g | Freq: Two times a day (BID) | ORAL | Status: DC
  Administered 2020-01-02 – 2020-01-04 (×4): 1 g via ORAL
  Filled 2020-01-02 (×5): qty 10

## 2020-01-02 MED ORDER — LABETALOL HCL 5 MG/ML IV SOLN
20.0000 mg | Freq: Once | INTRAVENOUS | Status: AC
  Administered 2020-01-02: 20 mg via INTRAVENOUS
  Filled 2020-01-02: qty 4

## 2020-01-02 MED ORDER — MIDAZOLAM HCL 2 MG/2ML IJ SOLN
INTRAMUSCULAR | Status: AC
  Filled 2020-01-02: qty 2

## 2020-01-02 MED ORDER — MENTHOL 3 MG MT LOZG
1.0000 | LOZENGE | OROMUCOSAL | Status: DC | PRN
  Administered 2020-01-02: 3 mg via ORAL
  Filled 2020-01-02: qty 9

## 2020-01-02 MED ORDER — PROPOFOL 500 MG/50ML IV EMUL
INTRAVENOUS | Status: AC
  Filled 2020-01-02: qty 50

## 2020-01-02 NOTE — Progress Notes (Signed)
NP BRandol Kern notified of critical lab troponin 396 also Hgb 6.6. New order administer 1 unit RBC 1 unit RBC.

## 2020-01-02 NOTE — Anesthesia Procedure Notes (Addendum)
Procedure Name: Intubation Date/Time: 01/02/2020 1:45 PM Performed by: Allean Found, CRNA Pre-anesthesia Checklist: Patient identified, Emergency Drugs available, Suction available, Patient being monitored and Timeout performed Patient Re-evaluated:Patient Re-evaluated prior to induction Oxygen Delivery Method: Nasal cannula Preoxygenation: Pre-oxygenation with 100% oxygen Induction Type: IV induction Ventilation: Mask ventilation without difficulty Laryngoscope Size: Mac and 3 Grade View: Grade I Tube type: Oral Tube size: 7.0 mm Number of attempts: 1 Airway Equipment and Method: Stylet Placement Confirmation: ETT inserted through vocal cords under direct vision,  positive ETCO2 and breath sounds checked- equal and bilateral Secured at: 21 cm Tube secured with: Tape Dental Injury: Teeth and Oropharynx as per pre-operative assessment

## 2020-01-02 NOTE — Progress Notes (Signed)
   01/01/20 2159  Assess: MEWS Score  Temp 98.3 F (36.8 C)  BP (!) 215/104  Pulse Rate 91  Resp 19  SpO2 100 %  O2 Device Room Air  Assess: MEWS Score  MEWS Temp 0  MEWS Systolic 2  MEWS Pulse 0  MEWS RR 0  MEWS LOC 0  MEWS Score 2  MEWS Score Color Yellow  Assess: if the MEWS score is Yellow or Red  Were vital signs taken at a resting state? Yes  Focused Assessment Change from prior assessment (see assessment flowsheet)  Early Detection of Sepsis Score *See Row Information* Low  MEWS guidelines implemented *See Row Information* Yes  Treat  MEWS Interventions Administered prn meds/treatments  Pain Scale 0-10  Pain Score 8  Pain Type Acute pain  Pain Location Chest  Pain Orientation Medial  Pain Frequency Constant  Pain Onset Sudden  Patients Stated Pain Goal 0  Pain Intervention(s) Medication (See eMAR)  Complains of Nausea /  Vomiting  Take Vital Signs  Increase Vital Sign Frequency  Yellow: Q 2hr X 2 then Q 4hr X 2, if remains yellow, continue Q 4hrs  Escalate  MEWS: Escalate Yellow: discuss with charge nurse/RN and consider discussing with provider and RRT  Notify: Charge Nurse/RN  Name of Charge Nurse/RN Notified Linnise RN  Date Charge Nurse/RN Notified 01/01/20  Time Charge Nurse/RN Notified 2159  Notify: Provider  Provider Name/Title Sharion Settler NP  Date Provider Notified 01/01/20  Time Provider Notified 2159  Notification Type  (Secure chat)  Notification Reason Change in status  Response See new orders  Date of Provider Response 01/01/20  Time of Provider Response 2159  Document  Patient Outcome Stabilized after interventions  Progress note created (see row info) Yes  PRN medications administered per orders with effectiveness patient currently resting peacefully will continue to monitor.

## 2020-01-02 NOTE — Progress Notes (Signed)
Patient c/o chest pain 8/10. BP 215/104. PRN hydralazine administered. NP B. Randol Kern notified, new orders troponin, EKG, nitropaste. Patient receives no relief from nitropaste. New order ativan 0.5 mg and KUB to look for evidence of obstruction. Will continue to monitor to ensure comfort and safety.

## 2020-01-02 NOTE — Op Note (Signed)
[facility] Patient Name: Donald [name] Procedure Date: 01/02/2020 1:36 PM MRN: [id number] Account #: [id number] Date of Birth: [birth year] Admit Type: Outpatient Age: 49 Room: [facility] ENDO ROOM 3 Gender: Male Note Status: Finalized Procedure:             Upper GI endoscopy Indications:           Hematemesis Providers:             Andrey Farmer MD, MD Medicines:             Monitored Anesthesia Care Complications:         No immediate complications. Estimated blood loss:                         Minimal. Procedure:             Pre-Anesthesia Assessment:                        - Prior to the procedure, a History and Physical was                         performed, and patient medications and allergies were                         reviewed. The patient is competent. The risks and                         benefits of the procedure and the sedation options and                         risks were discussed with the patient. All questions                         were answered and informed consent was obtained.                         Patient identification and proposed procedure were                         verified by the physician, the nurse, the anesthetist                         and the technician in the endoscopy suite. Mental                         Status Examination: alert and oriented. Airway                         Examination: normal oropharyngeal airway and neck                         mobility. Respiratory Examination: clear to                         auscultation. CV Examination: normal. Prophylactic                         Antibiotics: The patient does not require prophylactic  antibiotics. Prior Anticoagulants: The patient has                         taken no previous anticoagulant or antiplatelet                         agents. ASA Grade Assessment: III - A patient with                         severe systemic disease.  After reviewing the risks and                         benefits, the patient was deemed in satisfactory                         condition to undergo the procedure. The anesthesia                         plan was to use monitored anesthesia care (MAC).                         Immediately prior to administration of medications,                         the patient was re-assessed for adequacy to receive                         sedatives. The heart rate, respiratory rate, oxygen                         saturations, blood pressure, adequacy of pulmonary                         ventilation, and response to care were monitored                         throughout the procedure. The physical status of the                         patient was re-assessed after the procedure.                        After obtaining informed consent, the endoscope was                         passed under direct vision. Throughout the procedure,                         the patient's blood pressure, pulse, and oxygen                         saturations were monitored continuously. The Endoscope                         was introduced through the mouth, and advanced to the                         second part of duodenum. The upper GI endoscopy was  accomplished without difficulty. The patient tolerated                         the procedure well. Findings:      Few superficial esophageal ulcers with stigmata of recent bleeding were       found. Biopsies were taken with a cold forceps for histology. Biopsies       taken from center of one lesions. Estimated blood loss was minimal.       Consistent with previously diagnosed CMV esophagitis      Localized mild mucosal changes characterized by an increased vascular       pattern were found at the incisura. Biopsies were taken with a cold       forceps for histology. Estimated blood loss was minimal.      The examined duodenum was normal. Impression:             - Esophageal ulcers with stigmata of recent bleeding.                         Biopsied. Consistent with previously diagnosed CMV                         esophagitis                        - Increased vascular pattern mucosa in the incisura.                         Biopsied.                        - Normal examined duodenum. Recommendation:        - Resume previous diet.                        - Use sucralfate suspension 1 gram PO BID.                        - Await pathology results.                        - Return patient to hospital ward for ongoing care.                        - He needs to continue his treatment for CMV                         esophagitis Procedure Code(s):     --- Professional ---                        [phone], Esophagogastroduodenoscopy, flexible,                         transoral; with biopsy, single or multiple Diagnosis Code(s):     --- Professional ---                        K22.11, Ulcer of esophagus with bleeding                        K31.89, Other diseases of stomach  and duodenum                        K92.0, Hematemesis CPT copyright 2019 [employer]. All rights reserved. The codes documented in this report are preliminary and upon coder review may  be revised to meet current compliance requirements. Andrey Farmer, MD Andrey Farmer MD, MD 01/02/2020 1:61:09 PM Number of Addenda: 0 Note Initiated On: 01/02/2020 1:36 PM Estimated Blood Loss:  Estimated blood loss was minimal.      [facility]

## 2020-01-02 NOTE — Progress Notes (Signed)
Patient returned from procedure, His BP is 180/67 right arm , given PRN hydralazine 10MG  via IV.       01/02/20 1540  Vitals  Temp 98.7 F (37.1 C)  Temp Source Oral  BP (!) 180/67  MAP (mmHg) 96  BP Method Automatic  Pulse Rate 89  MEWS COLOR  MEWS Score Color Green  Oxygen Therapy  SpO2 98 %  MEWS Score  MEWS Temp 0  MEWS Systolic 0  MEWS Pulse 0  MEWS RR 1  MEWS LOC 0  MEWS Score 1

## 2020-01-02 NOTE — Consult Note (Signed)
Cardiology Consultation:   Patient ID: Donald [name] MRN: [id number]; DOB: [birth year]  Admit date: 12/31/2019 Date of Consult: 01/02/2020  Primary Care Provider: Patient, [name] Primary Cardiologist:Dr. Rockey Situ rounding, [place] Primary Electrophysiologist:  None    Patient Profile:   Donald [name] is a 49 y.o. male with a hx of CAD s/p three-vessel CABG in 2016 in, hyper hypertension, ESRD on peritoneal dialysis, DM2, and history of chest pain who is being seen today for the evaluation of elevated troponin in the setting of GI bleed at the request of Dr. Alfredia Ferguson.  History of Present Illness:   Mr. Min is a 49 year old male with PMH as above.  He denies alcohol or NSAID use.  He takes daily ASA.  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  He reports that he has been on treatment for this diagnosis, including PPI.  His baseline hemoglobin was reported as usually in the 8-9 range.  He presented to [facility] emergency department 07/2019 with chest pain, described as substernal, pressure-like, nonradiating, moderate intensity, and occurring intermittently over the previous 3 days.  Pain was worse with exertion.  He denied any associated nausea or vomiting.  No associated diaphoresis.  He did note some associated shortness of breath.  He had recently moved to [place] from [place] and not yet established care with a provider.  He reported recently running out of all medications.  In the ED, BP 227/125 with HR 87 bpm.  Oxygen saturations 93%.  High-sensitivity troponin I 13 and 98.  Chest x-ray showed pulmonary vascular congestion and mild cardiomegaly.  EKG showed NSR, ventricular rate 84 bpm, LVH with repolarization abnormalities and QTC of 502 ms.  It was recommended he optimize volume status with peritoneal dialysis, as he was found to be volume  overloaded.  He was started on IV heparin and restarted on all PTA medications.  He noted improvement in symptoms with improved BP.  High-sensitivity troponin elevation was suspected as 2/2 supply demand ischemia in the setting of hypertensive crisis, renal disease, and volume overload.  Recommendation was to obtain records from [place], where he reportedly underwent transplant work-up with echo and stress test in March 2021.   Follow-up in the office was recommended; however, he was lost to follow-up.  Echo 07/2019 showedEF 60 to 65%, NRWMA, mild LVH, G1 DD, mildly reduced RV systolic function, trivial MR, RAP 15 mmHg.   On 12/31/2019, he was seen in the peritoneal dialysis clinic.  He reported emesis with blood-tinged fluid x3.  He stated that he woke up around 4 AM that morning and felt weak with subsequent hematemesis, described as dark blood/clots. Previous hemoglobin 8.9 on 9/16.  He was noted to be scheduled for EGD 01/2020 for epigastric discomfort.  He was lightheaded upon standing, consistent with orthostatic hypotension.  He denied abdominal pain.  Recommendation was to present to the [facility] ED.    Upon arrival to the ED, he reported he was nauseated and dizzy.  He reported emesis with "clots" in it.  While in triage, he had an additional episode of emesis with ~400 cc of dark red blood.  Initial vitals significant for BP 127/55, HR 88 bpm, SPO2 100% on room air.  Labs showed sodium 131, creatinine 14.53, BUN 71, calcium 7.9, glucose 311, hemoglobin 6.1, RBC 1.95, hematocrit 18.3.  EKG showed NSR without significant ST/T  changes.  He was started on IV Protonix.  He was subsequently seen by GI and nephrology.  Per nephrology, recommendation was to hold peritoneal dialysis that night (9/22) and resume after his endoscopy (9/23).  BP medication was held due to hypotension. On 9/23 at approximately 9:29 AM, he felt irregular heart rate with order placed for telemetry and EKG.  Given ongoing hemoglobin below 7,  he underwent 1U transfusion of PRBC and additional unit held in setting of plan for PD on 9/23. Anesthesia would not take for EGD, and this was thus deferred. He has continued to note n/v since then.   At approximately 10 PM on 01/01/2020, he reported chest pain rated 8/10 with BP 215/104.  As needed hydralazine was administered.  High-sensitivity troponin was checked and noted to be elevated at 54.  He received Nitropaste and Ativan Ativan 0.5 mg.  KUB was ordered to look for evidence of obstruction.  Cardiology was consulted. At approximately 1:02 AM, BP 215/104.  Repeat high-sensitivity troponin 196.  Additional unit of PRBCs transfused.  Message sent to Dr. Garen Lah with suspicion for supply demand ischemia.  During consultation today, he is very somnolent, limiting his ROS and consultation.  He notes an additional episode of chest pain that occurred early this morning.  He reports that the chest pain was nonexertional and woke him from sleep.  CP further described as substernal and nonradiating.  This lasted for approximately 2 hours and was constant and severe.  He notes a similar episode of chest pain like this approximately 2 weeks ago and in the setting of emesis.  Due to patient's somnolence, additional HPI difficult to obtain.  He reports "[he] eats no more salt than normal people, estimated at a normal amount." He states " he eats out at the normal frequency that normal people eat out at restaurants."  He denies any recent racing heart rate, despite the above documented history of an earlier report of irregular heart rate/tachypalpitations.  He denies any recent or current dizziness, despite documentation indicating otherwise.  No recent syncope or falls.  He notes occasional leg edema.  He denies any orthopnea, PND, weight gain, or significant SOB at rest.  He denies any history of sleep apnea or any family history of sleep apnea.  He denies any past medical history of cardiac disease, despite  documented history of CAD s/p CABG. He reports medication compliance.    Heart Pathway Score:     Past Medical History:  Diagnosis Date  . Diabetes mellitus without complication ([place])   . Hypertension   . Renal disorder     Past Surgical History:  Procedure Laterality Date  . AV FISTULA PLACEMENT    . CARDIAC SURGERY    . CORONARY ARTERY BYPASS GRAFT  2016  . INSERTION OF DIALYSIS CATHETER       Home Medications:  Prior to Admission medications   Medication Sig Start Date End Date Taking? Authorizing Provider  aspirin EC 81 MG EC tablet Take 1 tablet (81 mg total) by mouth daily. 07/17/19  Yes Nita Sells, MD  Calcium Acetate, Phos Binder, (PHOSLO PO) Take 4 capsules by mouth 3 (three) times daily with meals. 12/05/19  Yes [provider]  carvedilol (COREG) 12.5 MG tablet Take 1 tablet (12.5 mg total) by mouth 2 (two) times daily with a meal. 07/16/19  Yes Nita Sells, MD  cinacalcet (SENSIPAR) 30 MG tablet Take 1 tablet (30 mg total) by mouth daily. 07/16/19  Yes Nita Sells, MD  furosemide (LASIX) 80 MG tablet Take 1 tablet (80 mg total) by mouth 2 (two) times daily. 07/16/19  Yes Nita Sells, MD  insulin aspart (NOVOLOG) 100 UNIT/ML FlexPen Inject 6 Units into the skin 3 (three) times daily with meals. 07/16/19  Yes Nita Sells, MD  insulin glargine (LANTUS) 100 UNIT/ML injection Inject 18 Units into the skin daily. 07/16/19  Yes [provider]  NIFEdipine (ADALAT CC) 60 MG 24 hr tablet Take 60 mg by mouth 2 (two) times daily. 09/24/19  Yes [provider]  pantoprazole (PROTONIX) 40 MG tablet Take 40 mg by mouth 2 (two) times daily. 12/19/19  Yes [provider]  paricalcitol (ZEMPLAR) 1 MCG capsule Take 3 capsules (3 mcg total) by mouth daily. 07/16/19  Yes Nita Sells, MD  rosuvastatin (CRESTOR) 10 MG tablet Take 10 mg by mouth at bedtime. 12/19/19  Yes [provider]  senna (SENOKOT) 8.6 MG  tablet Take 2 tablets (17.2 mg total) by mouth daily. 07/16/19  Yes Nita Sells, MD  sucroferric oxyhydroxide (VELPHORO) 500 MG chewable tablet Chew 1 tablet (500 mg total) by mouth 3 (three) times daily with meals. 07/16/19  Yes Nita Sells, MD  valganciclovir (VALCYTE) 50 MG/ML SOLR Take 4 mLs by mouth 2 (two) times a week. Tuesday and Friday 12/25/19  Yes [provider]  extraneal, ICODEXTRIN, peritoneal dialysis solution (ICODEXTRIN) 7.5 % As directed by nephro 07/16/19   Nita Sells, MD  Insulin Pen Needle 29G X 10MM MISC 1 each by Does not apply route in the morning, at noon, and at bedtime. 07/16/19   Nita Sells, MD  Peritoneal Dialysis Solutions (DIALYSIS SOLUTION 2.5% LOW-MG/LOW-CA) 394 MOSM/L SOLN dianeal solution As per nephro 07/16/19   Nita Sells, MD    Inpatient Medications: Scheduled Meds: . sodium chloride   Intravenous Once  . sodium chloride   Intravenous Once  . carvedilol  12.5 mg Oral BID WC  . cinacalcet  30 mg Oral Q breakfast  . gentamicin cream  1 application Topical Daily  . insulin aspart  0-9 Units Subcutaneous TID AC & HS  . insulin glargine  10 Units Subcutaneous Daily  . NIFEdipine  60 mg Oral Daily  . pantoprazole (PROTONIX) IV  40 mg Intravenous Q12H  . paricalcitol  3 mcg Oral Daily  . rosuvastatin  40 mg Oral Daily  . senna  1 tablet Oral Daily  . sucroferric oxyhydroxide  500 mg Oral TID WC  . [START ON 01/06/2020] valganciclovir  200 mg Oral Q Tue  . valganciclovir  200 mg Oral Q Fri   Continuous Infusions: . sodium chloride Stopped (12/31/19 1613)  . dialysis solution 1.5% low-MG/low-CA    . dialysis solution 2.5% low-MG/low-CA     PRN Meds: acetaminophen, hydrALAZINE, ondansetron (ZOFRAN) IV  Allergies:    Allergies  Allergen Reactions  . Lisinopril Hives  . Other     Other reaction(s): EGGPLANT    Social History:   Social History   Socioeconomic History  . Marital status: Married     Spouse name: Not on file  . Number of children: Not on file  . Years of education: Not on file  . Highest education level: Not on file  Occupational History  . Not on file  Tobacco Use  . Smoking status: Never Smoker  . Smokeless tobacco: Never Used  Substance and Sexual Activity  . Alcohol use: Yes    Comment: occasional   . Drug use: Not on file  . Sexual activity:  Not on file  Other Topics Concern  . Not on file  Social History Narrative  . Not on file   Social Determinants of Health   Financial Resource Strain:   . Difficulty of Paying Living Expenses: Not on file  Food Insecurity:   . Worried About Charity fundraiser in the Last Year: Not on file  . Ran Out of Food in the Last Year: Not on file  Transportation Needs:   . Lack of Transportation (Medical): Not on file  . Lack of Transportation (Non-Medical): Not on file  Physical Activity:   . Days of Exercise per Week: Not on file  . Minutes of Exercise per Session: Not on file  Stress:   . Feeling of Stress : Not on file  Social Connections:   . Frequency of Communication with Friends and Family: Not on file  . Frequency of Social Gatherings with Friends and Family: Not on file  . Attends Religious Services: Not on file  . Active Member of Clubs or Organizations: Not on file  . Attends Archivist Meetings: Not on file  . Marital Status: Not on file  Intimate Partner Violence:   . Fear of Current or Ex-Partner: Not on file  . Emotionally Abused: Not on file  . Physically Abused: Not on file  . Sexually Abused: Not on file    Family History:    Family History  Problem Relation Age of Onset  . Diabetes Mellitus II Mother   . Heart disease Mother      ROS:  Please see the history of present illness.  Review of Systems  Unable to perform ROS: Other  Constitutional: Positive for malaise/fatigue.  Respiratory: Positive for shortness of breath.        Denies shortness of breath.  Noted in EMR.    Cardiovascular: Positive for chest pain, palpitations and leg swelling. Negative for orthopnea.       Denies palpitations.  Reported in EMR. Notes occasional leg swelling.  Gastrointestinal: Positive for nausea and vomiting.  Neurological: Positive for dizziness and weakness.       Denies dizziness.  Noted in EMR.  All other systems reviewed and are negative.  ROS limited by somnolence.  ROS significantly complex with documented review of systems. All other ROS reviewed and negative.     Physical Exam/Data:   Vitals:   01/02/20 0526 01/02/20 0633 01/02/20 0717 01/02/20 0735  BP: (!) 163/83 (!) 184/86 (!) 160/78 (!) 202/84  Pulse: 89 91 89 89  Resp: 18 17  16   Temp: 98.6 F (37 C) 98.9 F (37.2 C) 98.6 F (37 C) 98.3 F (36.8 C)  TempSrc: Oral Oral Oral Oral  SpO2: 100% 99% 100% 100%  Weight:      Height:        Intake/Output Summary (Last 24 hours) at 01/02/2020 0742 Last data filed at 01/02/2020 0223 Gross per 24 hour  Intake 15135.71 ml  Output 401 ml  Net 14734.71 ml   Last 3 Weights 12/31/2019 12/31/2019 07/16/2019  Weight (lbs) 236 lb 4.8 oz 232 lb 12.9 oz 247 lb 2.2 oz  Weight (kg) 107.185 kg 105.6 kg 112.1 kg     Body mass index is 33.91 kg/m.  General:  Well nourished, well developed, in no acute distress.  Asleep this morning and somnolent on exam. HEENT: normal Neck: JVD difficult to assess 2/2 body habitus. Vascular: No carotid bruits; radial pulses 2+ bilaterally Cardiac:  normal S1, S2; RRR; 2/6  systolic murmur best appreciated in RUSB Lungs:  clear to auscultation bilaterally with anterior auscultation only due to patient somnolence, no wheezing, rhonchi or rales  Abd: soft, nontender, no hepatomegaly  Ext: no edema Musculoskeletal:  No deformities,  Skin: warm and dry  Neuro:  No focal abnormalities noted, somnolent Psych:  Normal affect, somnolent  EKG:  The EKG was personally reviewed and demonstrates: 9/23 EKG showed NSR, LVH with repolarization  abnormality, IVCD/LAD, 88 bpm, QRS 112 ms, sinus arrhythmia, baseline wander, nonspecific changes in inferolateral leads and TWI in V3-6, no acute changes from 07/16/19 tracing.  Telemetry:  Telemetry was personally reviewed and demonstrates:  NSR-ST, PVCs with occasional couplets.  Rates 80s to 130s with one brief spike to 163 bpm at approximately 15:27 on 9/23.  Relevant CV Studies: Echo 07/16/19 Left ventricular ejection fraction, by estimation, is 60 to 65%. The  left ventricle has normal function. The left ventricle has no regional  wall motion abnormalities. There is mild left ventricular hypertrophy.  Left ventricular diastolic parameters  are consistent with Grade I diastolic dysfunction (impaired relaxation).  2. Right ventricular systolic function is mildly reduced. The right  ventricular size is normal. Tricuspid regurgitation signal is inadequate  for assessing PA pressure.  3. The mitral valve is grossly normal. Trivial mitral valve  regurgitation.  4. The aortic valve is tricuspid. Aortic valve regurgitation is not  visualized.  5. The inferior vena cava is dilated in size with <50% respiratory  variability, suggesting right atrial pressure of 15 mmHg.  Laboratory Data:  High Sensitivity Troponin:   Recent Labs  Lab 01/01/20 2317 01/02/20 0356  TROPONINIHS 54* 396*     Cardiac EnzymesNo results for input(s): TROPONINI in the last 168 hours. No results for input(s): TROPIPOC in the last 168 hours.  Chemistry Recent Labs  Lab 12/31/19 1248 01/01/20 0406 01/02/20 0356  NA 131* 134* 132*  K 4.1 3.8 3.7  CL 94* 95* 93*  CO2 26 25 25   GLUCOSE 311* 161* 331*  BUN 71* 82* 78*  CREATININE 14.53* 15.62* 15.70*  CALCIUM 7.9* 8.3* 8.7*  GFRNONAA 3* 3* 3*  GFRAA 4* 4* 4*  ANIONGAP 11 14 14     Recent Labs  Lab 01/02/20 0356  PROT 5.8*  ALBUMIN 2.7*  AST 12*  ALT 14  ALKPHOS 48  BILITOT 0.7   Hematology Recent Labs  Lab 01/01/20 0406 01/01/20 0406  01/01/20 0831 01/01/20 1232 01/02/20 0356  WBC 7.4  --  7.0  --  6.0  RBC 1.94*  --  2.04*  --  2.20*  HGB 6.2*   < > 6.3* 6.8* 6.6*  HCT 17.3*   < > 18.5* 20.4* 19.9*  MCV 89.2  --  90.7  --  90.5  MCH 32.0  --  30.9  --  30.0  MCHC 35.8  --  34.1  --  33.2  RDW 16.1*  --  16.3*  --  16.8*  PLT 210  --  206  --  202   < > = values in this interval not displayed.   BNPNo results for input(s): BNP, PROBNP in the last 168 hours.  DDimer No results for input(s): DDIMER in the last 168 hours.   Radiology/Studies:  DG Abd 1 View  Result Date: 01/01/2020 CLINICAL DATA:  Intractable vomiting. EXAM: ABDOMEN - 1 VIEW COMPARISON:  None. FINDINGS: No evidence of free intra-abdominal air. Air throughout nondilated bowel loops in the right abdomen. No bowel dilatation to suggest obstruction.  Small volume of formed stool in the sigmoid colon. Innumerable pelvic calcifications typical of phleboliths. There also vascular calcifications. Median sternotomy wires in the lower chest partially included. The heart is enlarged. Degenerative change of both hips. Peritoneal dialysis catheter projects over the lower abdomen. IMPRESSION: 1. Nonobstructive bowel gas pattern. 2. Peritoneal dialysis catheter in the lower abdomen. Electronically Signed   By: Keith Rake M.D.   On: 01/01/2020 23:48    Assessment and Plan:   Chest pain with elevated high-sensitivity troponin --Reports chest pain with most recent episode earlier this morning and as above in HPI.  History of CAD s/p CABG in 2016.  BP this admission has been significantly elevated with high-sensitivity troponin 54 --> 394.  EKG without acute ST/T changes and showing NSR with LVH and repolarization abnormality, as well as nonspecific changes seen on prior 07/2019 EKG.   Previous echo as above from 07/2019 with EF nl, G1DD, and RAP 18mmHg. --Suspect elevated high-sensitivity troponin supply demand ischemia in the setting of acute GI bleed / severe anemia,  elevated BP, and ESRD on PD with elevated volume status. Cannot completely rule out cardiac etiology given his history of CAD; therefore, continue to cycle high-sensitivity troponin until peaked and downtrending.  Given his current GI bleed and comorbid conditions, he is not an ideal candidate for invasive ischemic work-up.  Could consider repeat limited echo. Given his current GI bleed with need for recurrent transfusion, do not recommend starting him on IV heparin. No ASA given GIB. Continue current Coreg, hydralazine, labetalol, statin, and PRN nitro.   Acute on chronic diastolic heart failure --Per EMR, reported SOB. Denies SOB today. Euvolemic on exam today s/p PD. Volume management per nephrology. Recent echo as directly above and could consider repeat limited echo before discharge with ongoing sx. Continue current medications.   Poorly controlled HTN --Continue current medications and titrate as needed for BP control.  BP likely elevated in the setting of volume overload and will improve with PD. Suspect elevated BP likely contributing to presentation as above.  Continue current Coreg, hydralazine, labetalol.   PVCs --Replete electrolytes with K goal 4.0, Mg goal 2.0. Daily BMET.  GIB --Daily CBC. Per GI, IM. No antiplatelets or anticoagulation at this time. Transfuse with goal 8.0.  HLD --Continue current statin.  ESRD on PD --Follows with [facility].  Nephrology has been consulted.  Further management work-up per nephrology.  DM2 --SSI, Per IM.  For questions or updates, please contact [place] Please consult [email or web address] for contact info under     Signed, Arvil Chaco, PA-C  01/02/2020 7:42 AM

## 2020-01-02 NOTE — Anesthesia Preprocedure Evaluation (Signed)
Anesthesia Evaluation  Patient identified by MRN, date of birth, ID band Patient awake    Reviewed: Allergy & Precautions, H&P , NPO status , Patient's Chart, lab work & pertinent test results, reviewed documented beta blocker date and time   Airway Mallampati: II   Neck ROM: full    Dental  (+) Poor Dentition   Pulmonary neg pulmonary ROS,    Pulmonary exam normal        Cardiovascular Exercise Tolerance: Good hypertension, On Medications negative cardio ROS Normal cardiovascular exam Rhythm:regular Rate:Normal     Neuro/Psych negative neurological ROS  negative psych ROS   GI/Hepatic negative GI ROS, Neg liver ROS,   Endo/Other  negative endocrine ROSdiabetes  Renal/GU Renal diseasePD yesterday   negative genitourinary   Musculoskeletal   Abdominal   Peds  Hematology negative hematology ROS (+)   Anesthesia Other Findings Past Medical History: No date: Diabetes mellitus without complication ([facility]) No date: Hypertension No date: Renal disorder Past Surgical History: No date: AV FISTULA PLACEMENT No date: CARDIAC SURGERY 2016: CORONARY ARTERY BYPASS GRAFT No date: INSERTION OF DIALYSIS CATHETER BMI    Body Mass Index: 33.91 kg/m     Reproductive/Obstetrics negative OB ROS                             Anesthesia Physical Anesthesia Plan  ASA: III and emergent  Anesthesia Plan: General   Post-op Pain Management:    Induction:   PONV Risk Score and Plan:   Airway Management Planned:   Additional Equipment:   Intra-op Plan:   Post-operative Plan:   Informed Consent: I have reviewed the patients History and Physical, chart, labs and discussed the procedure including the risks, benefits and alternatives for the proposed anesthesia with the patient or authorized representative who has indicated his/her understanding and acceptance.     Dental Advisory Given  Plan  Discussed with: CRNA  Anesthesia Plan Comments:         Anesthesia Quick Evaluation

## 2020-01-02 NOTE — Progress Notes (Signed)
[facility]  ROUNDING NOTE   Subjective:   Nausea and vomiting last night.   CCPD last night. UF of even.   PRBC transfusion this morning.   Objective:  Vital signs in last 24 hours:  Temp:  [98.3 F (36.8 C)-98.9 F (37.2 C)] 98.8 F (37.1 C) (09/24 0949) Pulse Rate:  [82-94] 94 (09/24 0949) Resp:  [16-20] 16 (09/24 0735) BP: (112-216)/(49-127) 154/67 (09/24 0949) SpO2:  [99 %-100 %] 100 % (09/24 0949)  Weight change:  Filed Weights   12/31/19 1238 12/31/19 2040  Weight: 105.6 kg 107.2 kg    Intake/Output: I/O last 3 completed shifts: In: 15515.7 [I.V.:287.7; Blood:728; Other:14500] Out: 401 [Emesis/NG output:400; Stool:1]   Intake/Output this shift:  Total I/O In: 342 [Blood:342] Out: -   Physical Exam:   General: In no acute distress Head:Normocephalic,Atraumatic Cardiac: S1/S2, RRR, No murmurs, No gallops or rubs. Respiratory: Clear bilaterally. No rales, wheezing, rhonchi or rubs. Abdomen: Soft, nondistended, nontender, Musculoskeletal: No pitting leg edema bilaterally.   Skin: No acute  rashes.  Neuro: Alert, oriented X3,  Access: Peritoneal cathter , LUA AVF  Basic Metabolic Panel: Recent Labs  Lab 12/31/19 1248 01/01/20 0406 01/02/20 0356  NA 131* 134* 132*  K 4.1 3.8 3.7  CL 94* 95* 93*  CO2 26 25 25   GLUCOSE 311* 161* 331*  BUN 71* 82* 78*  CREATININE 14.53* 15.62* 15.70*  CALCIUM 7.9* 8.3* 8.7*  MG  --   --  2.2  PHOS  --   --  4.6    Liver Function Tests: Recent Labs  Lab 01/02/20 0356  AST 12*  ALT 14  ALKPHOS 48  BILITOT 0.7  PROT 5.8*  ALBUMIN 2.7*   No results for input(s): LIPASE, AMYLASE in the last 168 hours. No results for input(s): AMMONIA in the last 168 hours.  CBC: Recent Labs  Lab 12/31/19 1248 12/31/19 1248 12/31/19 2235 01/01/20 0406 01/01/20 0831 01/01/20 1232 01/02/20 0356  WBC 5.6  --  8.4 7.4 7.0  --  6.0  NEUTROABS  --   --   --   --   --   --  4.6  HGB 6.1*   < > 6.5* 6.2* 6.3*  6.8* 6.6*  HCT 18.3*   < > 19.2* 17.3* 18.5* 20.4* 19.9*  MCV 93.8  --  91.9 89.2 90.7  --  90.5  PLT 207  --  222 210 206  --  202   < > = values in this interval not displayed.    Cardiac Enzymes: No results for input(s): CKTOTAL, CKMB, CKMBINDEX, TROPONINI in the last 168 hours.  BNP: Invalid input(s): POCBNP  CBG: Recent Labs  Lab 01/01/20 1143 01/01/20 1609 01/01/20 2033 01/01/20 2119 01/02/20 0732  GLUCAP 152* 162* 301* 286* 266*    Microbiology: Results for orders placed or performed during the hospital encounter of 12/31/19  SARS Coronavirus 2 by RT PCR (hospital order, performed in [facility] hospital lab) Nasopharyngeal Nasopharyngeal Swab     Status: None   Collection Time: 12/31/19  2:42 PM   Specimen: Nasopharyngeal Swab  Result Value Ref Range Status   SARS Coronavirus 2 NEGATIVE NEGATIVE Final    Comment: (NOTE) SARS-CoV-2 target nucleic acids are NOT DETECTED.  The SARS-CoV-2 RNA is generally detectable in upper and lower respiratory specimens during the acute phase of infection. The lowest concentration of SARS-CoV-2 viral copies this assay can detect is 250 copies / mL. A negative result does not preclude SARS-CoV-2  infection and should not be used as the sole basis for treatment or other patient management decisions.  A negative result may occur with improper specimen collection / handling, submission of specimen other than nasopharyngeal swab, presence of viral mutation(s) within the areas targeted by this assay, and inadequate number of viral copies (<250 copies / mL). A negative result must be combined with clinical observations, patient history, and epidemiological information.  Fact Sheet for Patients:   StrictlyIdeas.no  Fact Sheet for Healthcare Providers: BankingDealers.co.za  This test is not yet approved or  cleared by the [place] FDA and has been authorized for detection and/or  diagnosis of SARS-CoV-2 by FDA under an Emergency Use Authorization (EUA).  This EUA will remain in effect (meaning this test can be used) for the duration of the COVID-19 declaration under Section 564(b)(1) of the Act, 21 U.S.C. section 360bbb-3(b)(1), unless the authorization is terminated or revoked sooner.  Performed at [facility], [address] [place], [place] [address]   MRSA PCR Screening     Status: None   Collection Time: 01/01/20  4:19 AM   Specimen: Nasopharyngeal  Result Value Ref Range Status   MRSA by PCR NEGATIVE NEGATIVE Final    Comment:        The GeneXpert MRSA Assay (FDA approved for NASAL specimens only), is one component of a comprehensive MRSA colonization surveillance program. It is not intended to diagnose MRSA infection nor to guide or monitor treatment for MRSA infections. Performed at [facility], [address] [place], [place] [address]     Coagulation Studies: Recent Labs    12/31/19 1540  LABPROT 14.5  INR 1.2    Urinalysis: No results for input(s): COLORURINE, LABSPEC, PHURINE, GLUCOSEU, HGBUR, BILIRUBINUR, KETONESUR, PROTEINUR, UROBILINOGEN, NITRITE, LEUKOCYTESUR in the last 72 hours.  Invalid input(s): APPERANCEUR    Imaging: DG Abd 1 View  Result Date: 01/01/2020 CLINICAL DATA:  Intractable vomiting. EXAM: ABDOMEN - 1 VIEW COMPARISON:  None. FINDINGS: No evidence of free intra-abdominal air. Air throughout nondilated bowel loops in the right abdomen. No bowel dilatation to suggest obstruction. Small volume of formed stool in the sigmoid colon. Innumerable pelvic calcifications typical of phleboliths. There also vascular calcifications. Median sternotomy wires in the lower chest partially included. The heart is enlarged. Degenerative change of both hips. Peritoneal dialysis catheter projects over the lower abdomen. IMPRESSION: 1. Nonobstructive bowel gas pattern. 2. Peritoneal dialysis catheter in the lower  abdomen. Electronically Signed   By: Keith Rake M.D.   On: 01/01/2020 23:48     Medications:   . sodium chloride Stopped (12/31/19 1613)  . dialysis solution 1.5% low-MG/low-CA    . dialysis solution 2.5% low-MG/low-CA     . sodium chloride   Intravenous Once  . sodium chloride   Intravenous Once  . carvedilol  12.5 mg Oral BID WC  . cinacalcet  30 mg Oral Q breakfast  . gentamicin cream  1 application Topical Daily  . insulin aspart  0-9 Units Subcutaneous TID AC & HS  . insulin glargine  10 Units Subcutaneous Daily  . NIFEdipine  60 mg Oral Daily  . pantoprazole (PROTONIX) IV  40 mg Intravenous Q12H  . paricalcitol  3 mcg Oral Daily  . rosuvastatin  40 mg Oral Daily  . senna  1 tablet Oral Daily  . sucroferric oxyhydroxide  500 mg Oral TID WC  . [START ON 01/06/2020] valganciclovir  200 mg Oral Q Tue  . valganciclovir  200 mg  Oral Q Fri   acetaminophen, hydrALAZINE, ondansetron (ZOFRAN) IV, prochlorperazine  Assessment/ Plan:  Donald [name] is a 49 y.o. black male with end stage renal disease on peritoneal dialysis, peptic ulcer disease, diabetes mellitus type II insulin dependent, hypertension, coronary artery disease status post CABG, who is admitted to [facility] on 12/31/2019 for Hematemesis [K92.0] Acute upper GI bleed [K92.2] GI bleed [K92.2]  [facility] 113kg left AVF CCPD 10 hours 5 cycles 2923mL with last fill 2042mL extraneal.   1. End Stage Renal Disease: on peritoneal dialysis.  - peritoneal dialysis tonight. Hold daytime dwell.   2. Anemia with chronic kidney disease: with recent peptic ulcers With GI bleed - Appreciate GI input - status post 2 units PRBC since admission.   3. Hypertension: hypertensive urgency overnight secondary to nausea/vomiting.  - Carvedilol and Nifedipine  4. Secondary Hyperparathyroidism: outpatient labs on 9/16: PTH elevated 1011. Phos 5.1, calcium 8.2 - Auryxia with meals.  - hold cinacalcet due to GI symptoms -  paricalcitol   LOS: 1 Tomicka Lover 9/24/202111:03 AM

## 2020-01-02 NOTE — Transfer of Care (Signed)
Immediate Anesthesia Transfer of Care Note  Patient: Donald [name]  Procedure(s) Performed: ESOPHAGOGASTRODUODENOSCOPY (EGD) WITH PROPOFOL (N/A )  Patient Location: PACU  Anesthesia Type:General  Level of Consciousness: awake and oriented  Airway & Oxygen Therapy: Patient Spontanous Breathing and Patient connected to face mask oxygen  Post-op Assessment: Report given to RN and Post -op Vital signs reviewed and stable  Post vital signs: Reviewed and stable  Last Vitals:  Vitals Value Taken Time  BP 136/67 01/02/20 1422  Temp 36.6 C 01/02/20 1422  Pulse 95 01/02/20 1433  Resp 27 01/02/20 1433  SpO2 100 % 01/02/20 1433  Vitals shown include unvalidated device data.  Last Pain:  Vitals:   01/02/20 1422  TempSrc:   PainSc: 0-No pain      Patients Stated Pain Goal: 0 (06/09/47 9692)  Complications: No complications documented.

## 2020-01-02 NOTE — Progress Notes (Signed)
Lab reports critical troponin 396

## 2020-01-02 NOTE — Progress Notes (Signed)
PROGRESS NOTE    Donald [name]  IWP:[id number] DOB: [birth year] DOA: 12/31/2019 PCP: Patient, [name]   Brief Narrative:  HPI per Dr. Ivor Costa on 12/31/19  HPI: Donald [name] is a 49 y.o. male with medical history significant of ESRD on peritoneal dialysis, HTN, HLD, DM, esophageal ulcer, who presents with hematemesis.  Pt states that he he has had several episodes of vomiting blood with dark-colored blood since this morning.  Patient had another episode of hematemesis in the ED with approximate 400cc vomitus.  Denies abdominal pain or bloody stool.  Patient has mild cough, no shortness breath or chest pain.  Patient has dizziness and generalized weakness.  No symptoms of UTI or unilateral weakness. Of note, pt states that he had EGD recently at [facility] in [place] where he was found to have esophageal ulcers that were biopsied and he was diagnosed with CMV. Pt is taking ASA 81 mg daily. Not taking NSAID.   ED Course: pt was found to have hemoglobin 6.1 (8. 6 on 07/16/2019), pending COVID-19 PCR, potassium 4.1, bicarbonate 26, creatinine 14.5, BUN 71.  Temperature normal.  Blood pressure 127/55, heart rate 88, RR 16, oxygen saturation 100%.  Patient is placed on MedSurg bed of observation.  Check talked Locklear is consulted.   **Interim History Patient's hemoglobin still was not above 7 and was will be typed and screened and transfused 1 more unit but GI recommended holding against this and recommended getting peritoneal dialysis tonight and repeating blood count in the morning and then transfusing at that time.  Anesthesia would not take him today for his EGD with a hemoglobin less than 7 so GI recommended feeding the patient tonight and watching carefully.  Nephrology planning on doing peritoneal dialysis cycle tonight.  *9/23-9/24 overnight he had intractable nausea vomiting he went into hypertensive urgency.  He was given several antiemetics and find given tenogram and  Ativan which helped him calm down but his blood pressure still elevated.  He is started on Nitropaste and hydralazine.  Cardiology was consulted given his elevation in troponin and they feel it is demand ischemia in the setting of his known coronary disease.  They felt that he was an acceptable risk for an EGD but not a good candidate for anticoagulation at this time given his GI bleeding.  Cardiology recommended increasing the dose of nifedipine if it continues to run high.  Nephrology planning on peritoneal dialysis again tonight.  Patient's blood count was low this morning's dose was given in the 1 unit.  After the transfusion his hemoglobin was above 7 and was 7.3.  EGD was done and it showed esophageal ulcers with stigmata of recent bleeding which was biopsied and this was consistent with previously diagnosed CMV esophagitis.  GI recommended resuming his previous diet and using sucralfate 1 g p.o. twice daily  Assessment & Plan:   Principal Problem:   Hematemesis Active Problems:   Type II diabetes mellitus with renal manifestations (HCC)   Hypertension   ESRD on peritoneal dialysis ([place])   HLD (hyperlipidemia)   GI bleed   Hematemesis in the setting of likely esophageal ulcer and previously known CMV esophagitis.  -Patient's Hgb 8.6 -->6.1. Dr. Haig Prophet of is consulted pain on an EGD in the a.m. but his hemoglobin was low again this morning so he was given another 1 unit and is taken this afternoon. -He was placed in med-surg bed obs but will change to inpatient - hold ASA - transfuse 1  units of blood yesterday and 1 unit this morning; repeat hemoglobin/hematocrit after was 6.8/order for another 1 unit but GI recommended a gastric given his concern for volume overload - GI consulted by Ed, will follow up recommendations - NPO after MN for EGD  - IVF: 50 mL/hr of NS stopped - Start IV pantoprazole 40 mg bid and will continue -Continue with Vantin ganciclovir 200 mg p.o. every Tuesday and  Friday for history of CMV  - Zofran IV for nausea - Avoid NSAIDs and SQ heparin - Maintain IV access (2 large bore IVs if possible). - Monitor closely and follow q6h cbc, transfuse as necessary, if Hgb<7.0 - LaB: INR, PTT and type screen; PT was 14.5 and INR 1.2 -Continue monitor CBCs every 6 hours and continue monitor for signs of bleeding; because he is not actively bleeding currently GI recommending resuming his renal diet -He was given another unit of PRBCs given his low hemoglobin this morning -EGD done and showed was normal but he had esophageal ulcers with stigmata of recent bleeding which were biopsied and consistent with previously diagnosed CMV esophagitis.  There is also increased vascular pattern mucosa in the incisura which was also biopsied.  Gastroenterology recommending continuing CV medications, GERD medications as well as adding sucralfate 1 g p.o. twice daily  Intractable nausea and vomiting -In the setting of his CMV esophagitis -Continue with antiemetics and had IV Compazine -His intractable nausea and vomiting led to his hypertensive urgency overnight -Continue supportive care and appreciated GI evaluation recommendations  Type II diabetes mellitus with renal manifestations (HCC) -Recent A1c 6.1, well controlled.  Patient is taking NovoLog and Lantus at home -Continue with sensitive NovoLog/scale insulin before meals and at bedtime -Decreased Lantus from 15 to 10 units daily -CBGs are ranging from 123-267  Hypertension with hypertensive urgency -IV hydralazine 5 mg every 2 hours as needed for high blood pressure for systolic blood pressure of 165 as needed and this was increased -Continue home Carvedilol 12.5 mg p.o. twice daily, Nifedipine 60 mg p.o. daily -Continue to monitor blood pressures per protocol and he became hypertensive urgency last night and was given Nitropaste -Last blood pressure was 180/67  ESRD on peritoneal dialysis (HCC) -Dr. Juleen China of renal  is consulted for peritoneal dialysis and he will be getting peritoneal dialysis today -Patient's BUN/creatinine is now 78/15.7 -Continue with paricalcitol 3 mcg p.o. daily as well as VELPHORO 500 mg p.o. 3 times daily and cinacalcet 30 mg p.o. daily with breakfast -Patient to undergo peritoneal again.  Elevated Troponin/ Hx of CAD s/p CABG -In the setting of demand ischemia -Patient's troponin went from 54 and went all the way to 790 -Cardiology Consulted and appreciated recc's  HLD (Hyperlipidemia) -C/w Rosuvastatin 40 mg po Daily  Obesity -Complicates his prognosis and care -Estimated body mass index is 33.91 kg/m as calculated from the following:   Height as of this encounter: 5\' 10"  (1.778 m).   Weight as of this encounter: 107.2 kg. -Weight loss and Dietary Counseling given   DVT prophylaxis: SCDs Code Status: FULL CODE  Family Communication: No family present at bedside  Disposition Plan: Pending further GI work-up and clearance as well as clearance by nephrology and cardiology  Status is: Inpatient  Remains inpatient appropriate because:Ongoing diagnostic testing needed not appropriate for outpatient work up, Unsafe d/c plan, IV treatments appropriate due to intensity of illness or inability to take PO and Inpatient level of care appropriate due to severity of illness   Dispo: The  patient is from: Home              Anticipated d/c is to: TBD              Anticipated d/c date is: 2 days              Patient currently is not medically stable to d/c.  Consultants:   Gastroenterology  Nephrology  Cardiology   Procedures: Peritoneal Dialysis   EGD Findings:      Few superficial esophageal ulcers with stigmata of recent bleeding were       found. Biopsies were taken with a cold forceps for histology. Biopsies       taken from center of one lesions. Estimated blood loss was minimal.       Consistent with previously diagnosed CMV esophagitis      Localized mild  mucosal changes characterized by an increased vascular       pattern were found at the incisura. Biopsies were taken with a cold       forceps for histology. Estimated blood loss was minimal.      The examined duodenum was normal. Impression:            - Esophageal ulcers with stigmata of recent bleeding.                         Biopsied. Consistent with previously diagnosed CMV                         esophagitis                        - Increased vascular pattern mucosa in the incisura.                         Biopsied.                        - Normal examined duodenum  Antimicrobials:  Anti-infectives (From admission, onward)   Start     Dose/Rate Route Frequency Ordered Stop   01/06/20 1000  valganciclovir (VALCYTE) 50 MG/ML oral solution 200 mg       Note to Pharmacy: Tuesday and Friday     200 mg Oral Every Tue 01/01/20 1527     01/02/20 1000  valganciclovir (VALCYTE) 50 MG/ML oral solution 200 mg        200 mg Oral Every Fri 01/01/20 1535          Subjective: Seen and examined at bedside and he was somnolent and I just gotten Compazine up.  He had a very rough night.  His blood pressure was significantly elevated and he was having chest pain and is in the setting of his elevated blood pressure.  Cardiologist go to come by later today and he is still going for his EGD.  He did not really want to talk and just wanted to rest but denies any complaints of pain now.  No other concerns or complaints at this time.  Objective: Vitals:   01/02/20 1437 01/02/20 1452 01/02/20 1507 01/02/20 1540  BP: (!) 148/65 (!) 173/66  (!) 180/67  Pulse: 89 92  89  Resp: (!) 21 (!) 21    Temp:   (!) 97.5 F (36.4 C) 98.7 F (37.1 C)  TempSrc:    Oral  SpO2: 99% 95%  98%  Weight:      Height:        Intake/Output Summary (Last 24 hours) at 01/02/2020 1859 Last data filed at 01/02/2020 1431 Gross per 24 hour  Intake 442 ml  Output 448 ml  Net -6 ml   Filed Weights   12/31/19 1238 12/31/19  2040  Weight: 105.6 kg 107.2 kg   Examination: Physical Exam:  Constitutional: WN/WD obese African-American male currently in NAD and appears calm and comfortable he is resting with his eyes closed Eyes: Lids and conjunctivae normal, sclerae anicteric  ENMT: External Ears, Nose appear normal. Grossly normal hearing. Neck: Appears normal, supple, no cervical masses, normal ROM, no appreciable thyromegaly medical no JVD Respiratory: Diminished to auscultation bilaterally, no wheezing, rales, rhonchi or crackles. Normal respiratory effort and patient is not tachypenic. No accessory muscle use.  Unlabored breathing Cardiovascular: RRR, no murmurs / rubs / gallops. S1 and S2 auscultated.  Mild lower extremity edema Abdomen: Soft, non-tender, distended secondary body habitus. Bowel sounds positive.  GU: Deferred. Musculoskeletal: No clubbing / cyanosis of digits/nails. No joint deformity upper and lower extremities.  Skin: No rashes, lesions, ulcers on limited skin evaluation. No induration; Warm and dry.  Neurologic: CN 2-12 grossly intact with no focal deficits. Romberg sign and cerebellar reflexes not assessed.  Psychiatric: Normal judgment and insight. Alert and oriented x 3. Normal mood and appropriate affect.   Data Reviewed: I have personally reviewed following labs and imaging studies  CBC: Recent Labs  Lab 12/31/19 1248 12/31/19 1248 12/31/19 2235 12/31/19 2235 01/01/20 0406 01/01/20 0831 01/01/20 1232 01/02/20 0356 01/02/20 1141  WBC 5.6  --  8.4  --  7.4 7.0  --  6.0  --   NEUTROABS  --   --   --   --   --   --   --  4.6  --   HGB 6.1*   < > 6.5*   < > 6.2* 6.3* 6.8* 6.6* 7.3*  HCT 18.3*   < > 19.2*   < > 17.3* 18.5* 20.4* 19.9* 21.2*  MCV 93.8  --  91.9  --  89.2 90.7  --  90.5  --   PLT 207  --  222  --  210 206  --  202  --    < > = values in this interval not displayed.   Basic Metabolic Panel: Recent Labs  Lab 12/31/19 1248 01/01/20 0406 01/02/20 0356  NA 131*  134* 132*  K 4.1 3.8 3.7  CL 94* 95* 93*  CO2 26 25 25   GLUCOSE 311* 161* 331*  BUN 71* 82* 78*  CREATININE 14.53* 15.62* 15.70*  CALCIUM 7.9* 8.3* 8.7*  MG  --   --  2.2  PHOS  --   --  4.6   GFR: Estimated Creatinine Clearance: 7.1 mL/min (A) (by C-G formula based on SCr of 15.7 mg/dL (H)). Liver Function Tests: Recent Labs  Lab 01/02/20 0356  AST 12*  ALT 14  ALKPHOS 48  BILITOT 0.7  PROT 5.8*  ALBUMIN 2.7*   No results for input(s): LIPASE, AMYLASE in the last 168 hours. No results for input(s): AMMONIA in the last 168 hours. Coagulation Profile: Recent Labs  Lab 12/31/19 1540  INR 1.2   Cardiac Enzymes: No results for input(s): CKTOTAL, CKMB, CKMBINDEX, TROPONINI in the last 168 hours. BNP (last 3 results) No results for input(s): PROBNP in the last 8760 hours. HbA1C: No results for input(s): HGBA1C in the last 72  hours. CBG: Recent Labs  Lab 01/01/20 2119 01/02/20 0732 01/02/20 1145 01/02/20 1436 01/02/20 1658  GLUCAP 286* 266* 202* 152* 123*   Lipid Profile: No results for input(s): CHOL, HDL, LDLCALC, TRIG, CHOLHDL, LDLDIRECT in the last 72 hours. Thyroid Function Tests: No results for input(s): TSH, T4TOTAL, FREET4, T3FREE, THYROIDAB in the last 72 hours. Anemia Panel: No results for input(s): VITAMINB12, FOLATE, FERRITIN, TIBC, IRON, RETICCTPCT in the last 72 hours. Sepsis Labs: No results for input(s): PROCALCITON, LATICACIDVEN in the last 168 hours.  Recent Results (from the past 240 hour(s))  SARS Coronavirus 2 by RT PCR (hospital order, performed in [facility] hospital lab) Nasopharyngeal Nasopharyngeal Swab     Status: None   Collection Time: 12/31/19  2:42 PM   Specimen: Nasopharyngeal Swab  Result Value Ref Range Status   SARS Coronavirus 2 NEGATIVE NEGATIVE Final    Comment: (NOTE) SARS-CoV-2 target nucleic acids are NOT DETECTED.  The SARS-CoV-2 RNA is generally detectable in upper and lower respiratory specimens during the acute  phase of infection. The lowest concentration of SARS-CoV-2 viral copies this assay can detect is 250 copies / mL. A negative result does not preclude SARS-CoV-2 infection and should not be used as the sole basis for treatment or other patient management decisions.  A negative result may occur with improper specimen collection / handling, submission of specimen other than nasopharyngeal swab, presence of viral mutation(s) within the areas targeted by this assay, and inadequate number of viral copies (<250 copies / mL). A negative result must be combined with clinical observations, patient history, and epidemiological information.  Fact Sheet for Patients:   StrictlyIdeas.no  Fact Sheet for Healthcare Providers: BankingDealers.co.za  This test is not yet approved or  cleared by the [place] FDA and has been authorized for detection and/or diagnosis of SARS-CoV-2 by FDA under an Emergency Use Authorization (EUA).  This EUA will remain in effect (meaning this test can be used) for the duration of the COVID-19 declaration under Section 564(b)(1) of the Act, 21 U.S.C. section 360bbb-3(b)(1), unless the authorization is terminated or revoked sooner.  Performed at [facility], [address] [place], [place] [address]   MRSA PCR Screening     Status: None   Collection Time: 01/01/20  4:19 AM   Specimen: Nasopharyngeal  Result Value Ref Range Status   MRSA by PCR NEGATIVE NEGATIVE Final    Comment:        The GeneXpert MRSA Assay (FDA approved for NASAL specimens only), is one component of a comprehensive MRSA colonization surveillance program. It is not intended to diagnose MRSA infection nor to guide or monitor treatment for MRSA infections. Performed at [facility], [address] [place], [place] [address]      RN Pressure Injury Documentation:     Estimated body mass index is 33.91 kg/m as  calculated from the following:   Height as of this encounter: 5\' 10"  (1.778 m).   Weight as of this encounter: 107.2 kg.  Malnutrition Type:      Malnutrition Characteristics:      Nutrition Interventions:     Radiology Studies: DG Abd 1 View  Result Date: 01/01/2020 CLINICAL DATA:  Intractable vomiting. EXAM: ABDOMEN - 1 VIEW COMPARISON:  None. FINDINGS: No evidence of free intra-abdominal air. Air throughout nondilated bowel loops in the right abdomen. No bowel dilatation to suggest obstruction. Small volume of formed stool in the sigmoid colon. Innumerable pelvic calcifications typical of phleboliths. There also vascular  calcifications. Median sternotomy wires in the lower chest partially included. The heart is enlarged. Degenerative change of both hips. Peritoneal dialysis catheter projects over the lower abdomen. IMPRESSION: 1. Nonobstructive bowel gas pattern. 2. Peritoneal dialysis catheter in the lower abdomen. Electronically Signed   By: Keith Rake M.D.   On: 01/01/2020 23:48    Scheduled Meds:  sodium chloride   Intravenous Once   sodium chloride   Intravenous Once   carvedilol  12.5 mg Oral BID WC   gentamicin cream  1 application Topical Daily   insulin aspart  0-9 Units Subcutaneous TID AC & HS   insulin glargine  10 Units Subcutaneous Daily   NIFEdipine  60 mg Oral Daily   pantoprazole (PROTONIX) IV  40 mg Intravenous Q12H   paricalcitol  3 mcg Oral Daily   rosuvastatin  10 mg Oral Daily   senna  1 tablet Oral Daily   sucroferric oxyhydroxide  500 mg Oral TID WC   [START ON 01/06/2020] valganciclovir  200 mg Oral Q Tue   valganciclovir  200 mg Oral Q Fri   Continuous Infusions:  sodium chloride Stopped (12/31/19 1613)   dialysis solution 1.5% low-MG/low-CA     dialysis solution 2.5% low-MG/low-CA      LOS: 1 day   Kerney Elbe, DO [facility] PAGER is on AMION  If 7PM-7AM, please contact  night-coverage [email or web address]

## 2020-01-02 NOTE — Progress Notes (Signed)
Post blood infusion VS   01/02/20 0949  Vitals  Temp 98.8 F (37.1 C)  Temp Source Oral  Pulse Rate 94  BP (!) 154/67  Oxygen Therapy  SpO2 100 %

## 2020-01-02 NOTE — Progress Notes (Signed)
Cross Cover Note Patient with hypertensive urgency early last night with pressures over 200, accompanied by chest pain 9/10 and intractable vomiting.  No EKG changes. Treated with hydralazine nitro paste and labetalol. Vomiting improved after phenergan and ativan.   Initial troponin elevated at 54 expected secondary to ESRD, however no with upward trend at 396.  Likely demand ischemia.  Anticoagulation at this time not recommended with concerns of GI bleed.  Cardiology consulted. Sent message to Dr. Garen Lah. This am HGB 6.6 even with PD completion last night.  1 unit PRBC's ordered for transfusion this am

## 2020-01-03 LAB — COMPREHENSIVE METABOLIC PANEL
ALT: 13 U/L (ref 0–44)
AST: 13 U/L — ABNORMAL LOW (ref 15–41)
Albumin: 2.6 g/dL — ABNORMAL LOW (ref 3.5–5.0)
Alkaline Phosphatase: 47 U/L (ref 38–126)
Anion gap: 15 (ref 5–15)
BUN: 76 mg/dL — ABNORMAL HIGH (ref 6–20)
CO2: 26 mmol/L (ref 22–32)
Calcium: 8.5 mg/dL — ABNORMAL LOW (ref 8.9–10.3)
Chloride: 93 mmol/L — ABNORMAL LOW (ref 98–111)
Creatinine, Ser: 15.89 mg/dL — ABNORMAL HIGH (ref 0.61–1.24)
GFR calc Af Amer: 4 mL/min — ABNORMAL LOW (ref >60)
GFR calc non Af Amer: 3 mL/min — ABNORMAL LOW (ref >60)
Glucose, Bld: 177 mg/dL — ABNORMAL HIGH (ref 70–99)
Potassium: 3.1 mmol/L — ABNORMAL LOW (ref 3.5–5.1)
Sodium: 134 mmol/L — ABNORMAL LOW (ref 135–145)
Total Bilirubin: 0.6 mg/dL (ref 0.3–1.2)
Total Protein: 5.4 g/dL — ABNORMAL LOW (ref 6.5–8.1)

## 2020-01-03 LAB — GLUCOSE, CAPILLARY
Glucose-Capillary: 173 mg/dL — ABNORMAL HIGH (ref 70–99)
Glucose-Capillary: 148 mg/dL — ABNORMAL HIGH (ref 70–99)
Glucose-Capillary: 134 mg/dL — ABNORMAL HIGH (ref 70–99)
Glucose-Capillary: 120 mg/dL — ABNORMAL HIGH (ref 70–99)

## 2020-01-03 LAB — CBC WITH DIFFERENTIAL/PLATELET
Abs Immature Granulocytes: 0.03 10*3/uL (ref 0.00–0.07)
Basophils Absolute: 0 10*3/uL (ref 0.0–0.1)
Basophils Relative: 0 %
Eosinophils Absolute: 0.5 10*3/uL (ref 0.0–0.5)
Eosinophils Relative: 8 %
HCT: 20.2 % — ABNORMAL LOW (ref 39.0–52.0)
Hemoglobin: 6.8 g/dL — ABNORMAL LOW (ref 13.0–17.0)
Immature Granulocytes: 1 %
Lymphocytes Relative: 25 %
Lymphs Abs: 1.7 10*3/uL (ref 0.7–4.0)
MCH: 31.2 pg (ref 26.0–34.0)
MCHC: 33.7 g/dL (ref 30.0–36.0)
MCV: 92.7 fL (ref 80.0–100.0)
Monocytes Absolute: 0.7 10*3/uL (ref 0.1–1.0)
Monocytes Relative: 10 %
Neutro Abs: 3.6 10*3/uL (ref 1.7–7.7)
Neutrophils Relative %: 56 %
Platelets: 192 10*3/uL (ref 150–400)
RBC: 2.18 MIL/uL — ABNORMAL LOW (ref 4.22–5.81)
RDW: 16.5 % — ABNORMAL HIGH (ref 11.5–15.5)
WBC: 6.5 10*3/uL (ref 4.0–10.5)
nRBC: 0 % (ref 0.0–0.2)

## 2020-01-03 LAB — MAGNESIUM: Magnesium: 2 mg/dL (ref 1.7–2.4)

## 2020-01-03 LAB — PREPARE RBC (CROSSMATCH)

## 2020-01-03 LAB — PHOSPHORUS: Phosphorus: 5.6 mg/dL — ABNORMAL HIGH (ref 2.5–4.6)

## 2020-01-03 MED ORDER — FUROSEMIDE 10 MG/ML IJ SOLN
20.0000 mg | Freq: Once | INTRAMUSCULAR | Status: AC
  Administered 2020-01-03: 20 mg via INTRAVENOUS
  Filled 2020-01-03: qty 4

## 2020-01-03 MED ORDER — SODIUM CHLORIDE 0.9% IV SOLUTION: Freq: Once | INTRAVENOUS | Status: AC

## 2020-01-03 MED ORDER — POTASSIUM CHLORIDE CRYS ER 20 MEQ PO TBCR
40.0000 meq | EXTENDED_RELEASE_TABLET | Freq: Once | ORAL | Status: AC
  Administered 2020-01-03: 40 meq via ORAL
  Filled 2020-01-03: qty 2

## 2020-01-03 NOTE — Progress Notes (Signed)
PROGRESS NOTE    Donald [name]  JEH:[id number] DOB: 1970/06/03 DOA: 12/31/2019 PCP: Patient, [name]   Brief Narrative:  HPI per Dr. Ivor Costa on 12/31/19  HPI: Donald [name] is a 49 y.o. male with medical history significant of ESRD on peritoneal dialysis, HTN, HLD, DM, esophageal ulcer, who presents with hematemesis.  Pt states that he he has had several episodes of vomiting blood with dark-colored blood since this morning.  Patient had another episode of hematemesis in the ED with approximate 400cc vomitus.  Denies abdominal pain or bloody stool.  Patient has mild cough, no shortness breath or chest pain.  Patient has dizziness and generalized weakness.  No symptoms of UTI or unilateral weakness. Of note, pt states that he had EGD recently at [facility] in [place] where he was found to have esophageal ulcers that were biopsied and he was diagnosed with CMV. Pt is taking ASA 81 mg daily. Not taking NSAID.   ED Course: pt was found to have hemoglobin 6.1 (8. 6 on 07/16/2019), pending COVID-19 PCR, potassium 4.1, bicarbonate 26, creatinine 14.5, BUN 71.  Temperature normal.  Blood pressure 127/55, heart rate 88, RR 16, oxygen saturation 100%.  Patient is placed on MedSurg bed of observation.  Check talked Locklear is consulted.   **Interim History Patient's hemoglobin still was not above 7 and was will be typed and screened and transfused 1 more unit but GI recommended holding against this and recommended getting peritoneal dialysis tonight and repeating blood count in the morning and then transfusing at that time.  Anesthesia would not take him today for his EGD with a hemoglobin less than 7 so GI recommended feeding the patient tonight and watching carefully.  Nephrology planning on doing peritoneal dialysis cycle tonight.  *9/23-9/24 overnight he had intractable nausea vomiting he went into hypertensive urgency.  He was given several antiemetics and find given tenogram and  Ativan which helped him calm down but his blood pressure still elevated.  He is started on Nitropaste and hydralazine.  Cardiology was consulted given his elevation in troponin and they feel it is demand ischemia in the setting of his known coronary disease.  They felt that he was an acceptable risk for an EGD but not a good candidate for anticoagulation at this time given his GI bleeding.  Cardiology recommended increasing the dose of nifedipine if it continues to run high.  Nephrology planning on peritoneal dialysis again tonight.  Patient's blood count was low this morning's dose was given in the 1 unit.  After the transfusion his hemoglobin was above 7 and was 7.3.  EGD was done and it showed esophageal ulcers with stigmata of recent bleeding which was biopsied and this was consistent with previously diagnosed CMV esophagitis.  GI recommended resuming his previous diet and using sucralfate 1 g p.o. twice daily  01/03/20 Patient was feeling weak this morning and his blood count had dropped again is 6.8.  We will type and screen and transfuse 2 more units of PRBCs and give Lasix in between the units.  Unfortunately the nurse gave the Lasix before the blood was hung and we have ordered another 20 mg of IV Lasix in between the units of bloo  Patient's blood pressure is better controlled.  Potassium this morning was low.  Nephrology continuing peritoneal dialysis today.  Assessment & Plan:   Principal Problem:   Hematemesis Active Problems:   Type II diabetes mellitus with renal manifestations ([place])   Hypertension  ESRD on peritoneal dialysis ([place])   HLD (hyperlipidemia)   GI bleed   Hematemesis in the setting of likely esophageal ulcer and previously known CMV esophagitis.  -Patient's Hgb 8.6 -->6.1. Dr. Haig Prophet of is consulted and took the patient for an EGD yesterday.  This morning his hemoglobin was low again and it was 6.8 the morning -He was placed in med-surg bed obs but will change to  inpatient - hold ASA -We will type and screen and transfuse 2 more units of PRBCs today.  Total transfusion of PRBCs will be 5 units altogether - GI consulted by Ed, will follow up recommendations - NPO after MN for EGD  - IVF: 50 mL/hr of NS stopped; given IV Lasix between units however the nurse given before first blood was hung so we will order it again - Start IV pantoprazole 40 mg bid and will continue -Continue with Vantin ganciclovir 200 mg p.o. every Tuesday and Friday for history of CMV  - Zofran IV for nausea - Avoid NSAIDs and SQ heparin - Maintain IV access (2 large bore IVs if possible). - Monitor closely and follow q6h cbc, transfuse as necessary, if Hgb<7.0 - LaB: INR, PTT and type screen; PT was 14.5 and INR 1.2 -Continue monitor CBCs every 6 hours and continue monitor for signs of bleeding; because he is not actively bleeding currently GI recommending resuming his renal diet -He was given another unit of PRBCs given his low hemoglobin this morning -EGD done and showed was normal but he had esophageal ulcers with stigmata of recent bleeding which were biopsied and consistent with previously diagnosed CMV esophagitis.  There is also increased vascular pattern mucosa in the incisura which was also biopsied.  Gastroenterology recommending continuing CV medications, GERD medications as well as adding sucralfate 1 g p.o. twice daily and will continue  Intractable nausea and vomiting, improved -In the setting of his CMV esophagitis -Continue with antiemetics and had IV Compazine -His intractable nausea and vomiting led to his hypertensive urgency overnight -Continue supportive care and appreciated GI evaluation recommendations  Type II diabetes mellitus with renal manifestations (HCC) -Recent A1c 6.1, well controlled.  Patient is taking NovoLog and Lantus at home -Continue with sensitive NovoLog/scale insulin before meals and at bedtime -Decreased Lantus from 15 to 10 units  daily -CBGs are ranging from 123-180  Hypertension with hypertensive urgency, now improved -IV hydralazine 5 mg every 2 hours as needed for high blood pressure for systolic blood pressure of 165 as needed and this was increased -Continue home Carvedilol 12.5 mg p.o. twice daily, Nifedipine 60 mg p.o. daily -Continue to monitor blood pressures per protocol and he became hypertensive urgency last night and was given Nitropaste -Last blood pressure was 120/69  ESRD on peritoneal dialysis [facility]) -Dr. Juleen China of renal is consulted for peritoneal dialysis and he will be getting peritoneal dialysis today -Patient's BUN/creatinine is now 76/15.89 -Continue with paricalcitol 3 mcg p.o. daily as well as VELPHORO 500 mg p.o. 3 times daily and cinacalcet 30 mg p.o. daily with breakfast -Patient to undergo peritoneal dialysis again and nephrology is closely following  Elevated Troponin/ Hx of CAD s/p CABG -In the setting of demand ischemia -Patient's troponin went from 54 and went all the way to 790 -Cardiology Consulted and appreciated recc's and they feel this is demand ischemia in the setting of his hypertensive urgency  HLD (Hyperlipidemia) -C/w Rosuvastatin 40 mg po Daily  Hypokalemia  -Mild at 3.1 -Replete with p.o. KCl 40 mEq x  1 -Continue monitor and replete as necessary  -Repeat CMP in a.m.  Hyponatremia -Mild and Nephrology is also following -Continue to monitor and trend and repeat CMP in a.m.  Obesity -Complicates his prognosis and care -Estimated body mass index is 35.59 kg/m as calculated from the following:   Height as of this encounter: 5\' 10"  (1.778 m).   Weight as of this encounter: 112.5 kg. -Weight loss and Dietary Counseling given   DVT prophylaxis: SCDs Code Status: FULL CODE  Family Communication: No family present at bedside  Disposition Plan: Pending further GI work-up and clearance as well as clearance by nephrology and cardiology; will get PT OT when he is  more stable given his weakness  Status is: Inpatient  Remains inpatient appropriate because:Ongoing diagnostic testing needed not appropriate for outpatient work up, Unsafe d/c plan, IV treatments appropriate due to intensity of illness or inability to take PO and Inpatient level of care appropriate due to severity of illness   Dispo: The patient is from: Home              Anticipated d/c is to: TBD              Anticipated d/c date is: 2 days              Patient currently is not medically stable to d/c.  Consultants:   Gastroenterology  Nephrology  Cardiology   Procedures: Peritoneal Dialysis   EGD Findings:      Few superficial esophageal ulcers with stigmata of recent bleeding were       found. Biopsies were taken with a cold forceps for histology. Biopsies       taken from center of one lesions. Estimated blood loss was minimal.       Consistent with previously diagnosed CMV esophagitis      Localized mild mucosal changes characterized by an increased vascular       pattern were found at the incisura. Biopsies were taken with a cold       forceps for histology. Estimated blood loss was minimal.      The examined duodenum was normal. Impression:            - Esophageal ulcers with stigmata of recent bleeding.                         Biopsied. Consistent with previously diagnosed CMV                         esophagitis                        - Increased vascular pattern mucosa in the incisura.                         Biopsied.                        - Normal examined duodenum  Antimicrobials:  Anti-infectives (From admission, onward)   Start     Dose/Rate Route Frequency Ordered Stop   01/06/20 1000  valganciclovir (VALCYTE) 50 MG/ML oral solution 200 mg       Note to Pharmacy: Tuesday and Friday     200 mg Oral Every Tue 01/01/20 1527     01/02/20 1000  valganciclovir (VALCYTE) 50 MG/ML oral solution 200 mg  200 mg Oral Every Fri 01/01/20 1535           Subjective: Seen and examined at bedside and is more awake today but states that he is feeling weak.  States that his blood pressure is doing better and I had a better night last night.  No more vomiting.  Denies any chest pain, lightheadedness or dizziness.  No other concerns or complaints this time.  Objective: Vitals:   01/03/20 1428 01/03/20 1431 01/03/20 1643 01/03/20 1645  BP: (!) 99/49 (!) 112/56 115/68 115/68  Pulse: 73 73 76 77  Resp: 18 18 18 18   Temp: 98 F (36.7 C) 98 F (36.7 C) 98.6 F (37 C) 98.6 F (37 C)  TempSrc: Oral Oral Oral Oral  SpO2: 100% 100%  100%  Weight:      Height:        Intake/Output Summary (Last 24 hours) at 01/03/2020 1720 Last data filed at 01/03/2020 1643 Gross per 24 hour  Intake 15259.33 ml  Output --  Net 15259.33 ml   Filed Weights   12/31/19 1238 12/31/19 2040 01/03/20 0500  Weight: 105.6 kg 107.2 kg 112.5 kg   Examination: Physical Exam:  Constitutional: WN/WD obese African-American male currently in no acute distress appears more calm and comfortable and he is much more awake today than he was yesterday but feels generally weak and fatigued.  Eyes: Lids and conjunctivae normal, sclerae anicteric  ENMT: External Ears, Nose appear normal. Grossly normal hearing.  Neck: Appears normal, supple, no cervical masses, normal ROM, no appreciable thyromegaly; no JVD Respiratory: Diminished to auscultation bilaterally with unlabored breathing, no wheezing, rales, rhonchi or crackles. Normal respiratory effort and patient is not tachypenic. No accessory muscle use.  Cardiovascular: RRR, no murmurs / rubs / gallops. S1 and S2 auscultated.  Mild lower extremity edema Abdomen: Soft, non-tender, distended secondary body habitus. Bowel sounds positive.  GU: Deferred. Musculoskeletal: No clubbing / cyanosis of digits/nails. No joint deformity upper and lower extremities.  Skin: No rashes, lesions, ulcers on limited skin evaluation. No induration;  Warm and dry.  Neurologic: CN 2-12 grossly intact with no focal deficits. Romberg sign and cerebellar reflexes not assessed.  Psychiatric: Normal judgment and insight. Alert and oriented x 3. Normal mood and appropriate affect.   Data Reviewed: I have personally reviewed following labs and imaging studies  CBC: Recent Labs  Lab 12/31/19 2235 12/31/19 2235 01/01/20 0406 01/01/20 0406 01/01/20 0831 01/01/20 1232 01/02/20 0356 01/02/20 1141 01/03/20 0500  WBC 8.4  --  7.4  --  7.0  --  6.0  --  6.5  NEUTROABS  --   --   --   --   --   --  4.6  --  3.6  HGB 6.5*   < > 6.2*   < > 6.3* 6.8* 6.6* 7.3* 6.8*  HCT 19.2*   < > 17.3*   < > 18.5* 20.4* 19.9* 21.2* 20.2*  MCV 91.9  --  89.2  --  90.7  --  90.5  --  92.7  PLT 222  --  210  --  206  --  202  --  192   < > = values in this interval not displayed.   Basic Metabolic Panel: Recent Labs  Lab 12/31/19 1248 01/01/20 0406 01/02/20 0356 01/03/20 0500  NA 131* 134* 132* 134*  K 4.1 3.8 3.7 3.1*  CL 94* 95* 93* 93*  CO2 26 25 25 26   GLUCOSE 311* 161* 331*  177*  BUN 71* 82* 78* 76*  CREATININE 14.53* 15.62* 15.70* 15.89*  CALCIUM 7.9* 8.3* 8.7* 8.5*  MG  --   --  2.2 2.0  PHOS  --   --  4.6 5.6*   GFR: Estimated Creatinine Clearance: 7.1 mL/min (A) (by C-G formula based on SCr of 15.89 mg/dL (H)). Liver Function Tests: Recent Labs  Lab 01/02/20 0356 01/03/20 0500  AST 12* 13*  ALT 14 13  ALKPHOS 48 47  BILITOT 0.7 0.6  PROT 5.8* 5.4*  ALBUMIN 2.7* 2.6*   No results for input(s): LIPASE, AMYLASE in the last 168 hours. No results for input(s): AMMONIA in the last 168 hours. Coagulation Profile: Recent Labs  Lab 12/31/19 1540  INR 1.2   Cardiac Enzymes: No results for input(s): CKTOTAL, CKMB, CKMBINDEX, TROPONINI in the last 168 hours. BNP (last 3 results) No results for input(s): PROBNP in the last 8760 hours. HbA1C: No results for input(s): HGBA1C in the last 72 hours. CBG: Recent Labs  Lab 01/02/20 1436  01/02/20 1658 01/02/20 2259 01/03/20 0801 01/03/20 1151  GLUCAP 152* 123* 180* 173* 148*   Lipid Profile: No results for input(s): CHOL, HDL, LDLCALC, TRIG, CHOLHDL, LDLDIRECT in the last 72 hours. Thyroid Function Tests: No results for input(s): TSH, T4TOTAL, FREET4, T3FREE, THYROIDAB in the last 72 hours. Anemia Panel: No results for input(s): VITAMINB12, FOLATE, FERRITIN, TIBC, IRON, RETICCTPCT in the last 72 hours. Sepsis Labs: No results for input(s): PROCALCITON, LATICACIDVEN in the last 168 hours.  Recent Results (from the past 240 hour(s))  SARS Coronavirus 2 by RT PCR (hospital order, performed in [facility] hospital lab) Nasopharyngeal Nasopharyngeal Swab     Status: None   Collection Time: 12/31/19  2:42 PM   Specimen: Nasopharyngeal Swab  Result Value Ref Range Status   SARS Coronavirus 2 NEGATIVE NEGATIVE Final    Comment: (NOTE) SARS-CoV-2 target nucleic acids are NOT DETECTED.  The SARS-CoV-2 RNA is generally detectable in upper and lower respiratory specimens during the acute phase of infection. The lowest concentration of SARS-CoV-2 viral copies this assay can detect is 250 copies / mL. A negative result does not preclude SARS-CoV-2 infection and should not be used as the sole basis for treatment or other patient management decisions.  A negative result may occur with improper specimen collection / handling, submission of specimen other than nasopharyngeal swab, presence of viral mutation(s) within the areas targeted by this assay, and inadequate number of viral copies (<250 copies / mL). A negative result must be combined with clinical observations, patient history, and epidemiological information.  Fact Sheet for Patients:   StrictlyIdeas.no  Fact Sheet for Healthcare Providers: BankingDealers.co.za  This test is not yet approved or  cleared by the [place] FDA and has been authorized for detection  and/or diagnosis of SARS-CoV-2 by FDA under an Emergency Use Authorization (EUA).  This EUA will remain in effect (meaning this test can be used) for the duration of the COVID-19 declaration under Section 564(b)(1) of the Act, 21 U.S.C. section 360bbb-3(b)(1), unless the authorization is terminated or revoked sooner.  Performed at [facility], [address] [place], [place] [address]   MRSA PCR Screening     Status: None   Collection Time: 01/01/20  4:19 AM   Specimen: Nasopharyngeal  Result Value Ref Range Status   MRSA by PCR NEGATIVE NEGATIVE Final    Comment:        The GeneXpert MRSA Assay (FDA approved for NASAL specimens only),  is one component of a comprehensive MRSA colonization surveillance program. It is not intended to diagnose MRSA infection nor to guide or monitor treatment for MRSA infections. Performed at [facility], [address] [place], [place] [address]      RN Pressure Injury Documentation:     Estimated body mass index is 35.59 kg/m as calculated from the following:   Height as of this encounter: 5\' 10"  (1.778 m).   Weight as of this encounter: 112.5 kg.  Malnutrition Type:      Malnutrition Characteristics:      Nutrition Interventions:     Radiology Studies: DG Abd 1 View  Result Date: 01/01/2020 CLINICAL DATA:  Intractable vomiting. EXAM: ABDOMEN - 1 VIEW COMPARISON:  None. FINDINGS: No evidence of free intra-abdominal air. Air throughout nondilated bowel loops in the right abdomen. No bowel dilatation to suggest obstruction. Small volume of formed stool in the sigmoid colon. Innumerable pelvic calcifications typical of phleboliths. There also vascular calcifications. Median sternotomy wires in the lower chest partially included. The heart is enlarged. Degenerative change of both hips. Peritoneal dialysis catheter projects over the lower abdomen. IMPRESSION: 1. Nonobstructive bowel gas pattern. 2. Peritoneal  dialysis catheter in the lower abdomen. Electronically Signed   By: Keith Rake M.D.   On: 01/01/2020 23:48    Scheduled Meds: . sodium chloride   Intravenous Once  . sodium chloride   Intravenous Once  . carvedilol  12.5 mg Oral BID WC  . gentamicin cream  1 application Topical Daily  . insulin aspart  0-9 Units Subcutaneous TID AC & HS  . insulin glargine  10 Units Subcutaneous Daily  . NIFEdipine  60 mg Oral Daily  . pantoprazole (PROTONIX) IV  40 mg Intravenous Q12H  . paricalcitol  3 mcg Oral Daily  . rosuvastatin  10 mg Oral Daily  . senna  1 tablet Oral Daily  . sucralfate  1 g Oral BID  . sucroferric oxyhydroxide  500 mg Oral TID WC  . [START ON 01/06/2020] valganciclovir  200 mg Oral Q Tue  . valganciclovir  200 mg Oral Q Fri   Continuous Infusions: . dialysis solution 1.5% low-MG/low-CA    . dialysis solution 2.5% low-MG/low-CA      LOS: 2 days   Kerney Elbe, DO [facility] PAGER is on [place]  If 7PM-7AM, please contact night-coverage [email or web address]

## 2020-01-03 NOTE — Progress Notes (Signed)
Donald [name]  MRN: [id number]  DOB/AGE: 08/16/1970 49 y.o.  Primary Care Physician:Patient, [name]  Admit date: 12/31/2019  Chief Complaint:  Chief Complaint  Patient presents with  . Dizziness    S-Pt presented on  12/31/2019 with  Chief Complaint  Patient presents with  . Dizziness  . Patient main complaint in today visit was " the PD is doing fine.  It is my low blood."  Medications . sodium chloride   Intravenous Once  . sodium chloride   Intravenous Once  . carvedilol  12.5 mg Oral BID WC  . gentamicin cream  1 application Topical Daily  . insulin aspart  0-9 Units Subcutaneous TID AC & HS  . insulin glargine  10 Units Subcutaneous Daily  . NIFEdipine  60 mg Oral Daily  . pantoprazole (PROTONIX) IV  40 mg Intravenous Q12H  . paricalcitol  3 mcg Oral Daily  . rosuvastatin  10 mg Oral Daily  . senna  1 tablet Oral Daily  . sucralfate  1 g Oral BID  . sucroferric oxyhydroxide  500 mg Oral TID WC  . [START ON 01/06/2020] valganciclovir  200 mg Oral Q Tue  . valganciclovir  200 mg Oral Q Fri         ATF:TDDUK from the symptoms mentioned above,there are no other symptoms referable to all systems reviewed.  Physical Exam: Vital signs in last 24 hours: Temp:  [97.5 F (36.4 C)-98.7 F (37.1 C)] 98.5 F (36.9 C) (09/25 0801) Pulse Rate:  [83-93] 90 (09/25 0801) Resp:  [17-21] 18 (09/25 0801) BP: (114-180)/(55-74) 114/55 (09/25 0801) SpO2:  [95 %-100 %] 100 % (09/25 0801) Weight:  [112.5 kg] 112.5 kg (09/25 0500) Weight change:  Last BM Date: 01/01/20  Intake/Output from previous day: 09/24 0701 - 09/25 0700 In: 02542 [I.V.:100; Blood:342] Out: 47  No intake/output data recorded.   Physical Exam: General- pt is awake,alert, oriented to time place and person Resp- No acute REsp distress, CTA B/L NO Rhonchi CVS- S1S2 regular in rate and rhythm GIT- BS+, soft, NT, ND, PD catheter in situ EXT- NO LE Edema, Cyanosis Access AV fistula in situ  Lab  Results: CBC Recent Labs    01/02/20 0356 01/02/20 0356 01/02/20 1141 01/03/20 0500  WBC 6.0  --   --  6.5  HGB 6.6*   < > 7.3* 6.8*  HCT 19.9*   < > 21.2* 20.2*  PLT 202  --   --  192   < > = values in this interval not displayed.    BMET Recent Labs    01/02/20 0356 01/03/20 0500  NA 132* 134*  K 3.7 3.1*  CL 93* 93*  CO2 25 26  GLUCOSE 331* 177*  BUN 78* 76*  CREATININE 15.70* 15.89*  CALCIUM 8.7* 8.5*    MICRO Recent Results (from the past 240 hour(s))  SARS Coronavirus 2 by RT PCR (hospital order, performed in [facility] hospital lab) Nasopharyngeal Nasopharyngeal Swab     Status: None   Collection Time: 12/31/19  2:42 PM   Specimen: Nasopharyngeal Swab  Result Value Ref Range Status   SARS Coronavirus 2 NEGATIVE NEGATIVE Final    Comment: (NOTE) SARS-CoV-2 target nucleic acids are NOT DETECTED.  The SARS-CoV-2 RNA is generally detectable in upper and lower respiratory specimens during the acute phase of infection. The lowest concentration of SARS-CoV-2 viral copies this assay can detect is 250 copies / mL. A negative result does not preclude SARS-CoV-2 infection and  should not be used as the sole basis for treatment or other patient management decisions.  A negative result may occur with improper specimen collection / handling, submission of specimen other than nasopharyngeal swab, presence of viral mutation(s) within the areas targeted by this assay, and inadequate number of viral copies (<250 copies / mL). A negative result must be combined with clinical observations, patient history, and epidemiological information.  Fact Sheet for Patients:   StrictlyIdeas.no  Fact Sheet for Healthcare Providers: BankingDealers.co.za  This test is not yet approved or  cleared by the [place] FDA and has been authorized for detection and/or diagnosis of SARS-CoV-2 by FDA under an Emergency Use Authorization (EUA).   This EUA will remain in effect (meaning this test can be used) for the duration of the COVID-19 declaration under Section 564(b)(1) of the Act, 21 U.S.C. section 360bbb-3(b)(1), unless the authorization is terminated or revoked sooner.  Performed at [facility], [address] [place], [place] [address]   MRSA PCR Screening     Status: None   Collection Time: 01/01/20  4:19 AM   Specimen: Nasopharyngeal  Result Value Ref Range Status   MRSA by PCR NEGATIVE NEGATIVE Final    Comment:        The GeneXpert MRSA Assay (FDA approved for NASAL specimens only), is one component of a comprehensive MRSA colonization surveillance program. It is not intended to diagnose MRSA infection nor to guide or monitor treatment for MRSA infections. Performed at [facility], [address] [place], [place] [address]       Lab Results  Component Value Date   PTH 652 (H) 07/16/2019   CALCIUM 8.5 (L) 01/03/2020   PHOS 5.6 (H) 01/03/2020               Impression:   [name] is a 49 y.o. black male with end stage renal disease on peritoneal dialysis, peptic ulcer disease, diabetes mellitus type II insulin dependent, hypertension, coronary artery disease status post CABG, who is admitted to [facility] on 12/31/2019 for Hematemesis [K92.0] Acute upper GI bleed [K92.2] GI bleed [K92.2]  1)Renal end-stage renal disease Patient is on peritoneal dialysis [facility] 113kg left AVF CCPD 10 hours 5 cycles 2930mL with last fill 2054mL extraneal.  Patient is to continue to be on PD  2)HTN Blood pressure is now much better controlled  3)Anemia of chronic disease admitted with GI bleed  HGb is not at goal (9--11) Patient receiving PRBC today  4) secondary hyperparathyroidism -CKD Mineral-Bone Disorder   Secondary Hyperparathyroidism present Patient was on cinacalcet as an outpatient Patient Sensipar is being held secondary to GI bleed/GI  symptoms Phosphorus near to the goal .   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

## 2020-01-03 NOTE — Progress Notes (Signed)
Progress Note  Patient Name: Donald [name] Date of Encounter: 01/03/2020  [facility] Cardiologist: New  Subjective   Pt denies CP  A little nauseated  Breathingis fair  Inpatient Medications    Scheduled Meds: . sodium chloride   Intravenous Once  . sodium chloride   Intravenous Once  . carvedilol  12.5 mg Oral BID WC  . gentamicin cream  1 application Topical Daily  . insulin aspart  0-9 Units Subcutaneous TID AC & HS  . insulin glargine  10 Units Subcutaneous Daily  . NIFEdipine  60 mg Oral Daily  . pantoprazole (PROTONIX) IV  40 mg Intravenous Q12H  . paricalcitol  3 mcg Oral Daily  . rosuvastatin  10 mg Oral Daily  . senna  1 tablet Oral Daily  . sucralfate  1 g Oral BID  . sucroferric oxyhydroxide  500 mg Oral TID WC  . [START ON 01/06/2020] valganciclovir  200 mg Oral Q Tue  . valganciclovir  200 mg Oral Q Fri   Continuous Infusions: . sodium chloride Stopped (12/31/19 1613)  . dialysis solution 1.5% low-MG/low-CA    . dialysis solution 2.5% low-MG/low-CA     PRN Meds: acetaminophen, hydrALAZINE, menthol-cetylpyridinium, ondansetron (ZOFRAN) IV, phenol, prochlorperazine   Vital Signs    Vitals:   01/02/20 1540 01/02/20 2346 01/03/20 0500 01/03/20 0801  BP: (!) 180/67 133/65  (!) 114/55  Pulse: 89 83  90  Resp:  19  18  Temp: 98.7 F (37.1 C) 98.5 F (36.9 C)  98.5 F (36.9 C)  TempSrc: Oral Oral  Oral  SpO2: 98% 99%  100%  Weight:   112.5 kg   Height:        Intake/Output Summary (Last 24 hours) at 01/03/2020 1132 Last data filed at 01/02/2020 2040 Gross per 24 hour  Intake 14600 ml  Output --  Net 14600 ml   Last 3 Weights 01/03/2020 12/31/2019 12/31/2019  Weight (lbs) 248 lb 0.3 oz 236 lb 4.8 oz 232 lb 12.9 oz  Weight (kg) 112.5 kg 107.185 kg 105.6 kg      Telemetry    SR  - Personally Reviewed  ECG    No new  - Personally Reviewed  Physical Exam   GEN: No acute distress.   Neck: No JVD Cardiac: RRR, no murmurs, Respiratory:  Clear to auscultation bilaterally. GI: Soft, nontender, non-distended  MS: No edema; No deformity. Neuro:  Nonfocal  Psych: Normal affect   Labs    High Sensitivity Troponin:   Recent Labs  Lab 01/01/20 2317 01/02/20 0356 01/02/20 1141  TROPONINIHS 54* 396* 790*      Chemistry Recent Labs  Lab 01/01/20 0406 01/02/20 0356 01/03/20 0500  NA 134* 132* 134*  K 3.8 3.7 3.1*  CL 95* 93* 93*  CO2 25 25 26   GLUCOSE 161* 331* 177*  BUN 82* 78* 76*  CREATININE 15.62* 15.70* 15.89*  CALCIUM 8.3* 8.7* 8.5*  PROT  --  5.8* 5.4*  ALBUMIN  --  2.7* 2.6*  AST  --  12* 13*  ALT  --  14 13  ALKPHOS  --  48 47  BILITOT  --  0.7 0.6  GFRNONAA 3* 3* 3*  GFRAA 4* 4* 4*  ANIONGAP 14 14 15      Hematology Recent Labs  Lab 01/01/20 0831 01/01/20 1232 01/02/20 0356 01/02/20 1141 01/03/20 0500  WBC 7.0  --  6.0  --  6.5  RBC 2.04*  --  2.20*  --  2.18*  HGB 6.3*   < > 6.6* 7.3* 6.8*  HCT 18.5*   < > 19.9* 21.2* 20.2*  MCV 90.7  --  90.5  --  92.7  MCH 30.9  --  30.0  --  31.2  MCHC 34.1  --  33.2  --  33.7  RDW 16.3*  --  16.8*  --  16.5*  PLT 206  --  202  --  192   < > = values in this interval not displayed.    BNPNo results for input(s): BNP, PROBNP in the last 168 hours.   DDimer No results for input(s): DDIMER in the last 168 hours.   Radiology    DG Abd 1 View  Result Date: 01/01/2020 CLINICAL DATA:  Intractable vomiting. EXAM: ABDOMEN - 1 VIEW COMPARISON:  None. FINDINGS: No evidence of free intra-abdominal air. Air throughout nondilated bowel loops in the right abdomen. No bowel dilatation to suggest obstruction. Small volume of formed stool in the sigmoid colon. Innumerable pelvic calcifications typical of phleboliths. There also vascular calcifications. Median sternotomy wires in the lower chest partially included. The heart is enlarged. Degenerative change of both hips. Peritoneal dialysis catheter projects over the lower abdomen. IMPRESSION: 1. Nonobstructive  bowel gas pattern. 2. Peritoneal dialysis catheter in the lower abdomen. Electronically Signed   By: Keith Rake M.D.   On: 01/01/2020 23:48    Cardiac Studies   Echo April 2021  1. Left ventricular ejection fraction, by estimation, is 60 to 65%. The left ventricle has normal function. The left ventricle has no regional wall motion abnormalities. There is mild left ventricular hypertrophy. Left ventricular diastolic parameters are consistent with Grade I diastolic dysfunction (impaired relaxation). 2. Right ventricular systolic function is mildly reduced. The right ventricular size is normal. Tricuspid regurgitation signal is inadequate for assessing PA pressure. 3. The mitral valve is grossly normal. Trivial mitral valve regurgitation. 4. The aortic valve is tricuspid. Aortic valve regurgitation is not visualized. 5. The inferior vena cava is dilated in size with <50% respiratory variability, suggesting right atrial pressure of 15 mmHg.  Patient Profile     49 y.o. male with a hx of CAD s/p three-vessel CABG in 2016 in, hyper hypertension, ESRD on peritoneal dialysis, DM2, and history of chest pain who is being seen today for the evaluation of elevated troponin in the setting of GI bleed at the request of Dr. Alfredia Ferguson.  Assessment & Plan    *`  Chest pain Hx of CP in past  Presented to ED with bleeding   Developed CP    Has had itntermitt at home  Peak troponin 790  ( 54, 396, 790) ( in setting of dialysis)   Pt with profound anemia  Hgb 6.8  Most likely demand ischemia in setting of CAD and GI bleeding    No plans for invasive eval for now   2  Hx CAD  S/p CABG 2016  Followed in [place]  ([place])  Scant records   Stress test in March 2021 (pretransplant Evaluation)  Unable to lacate report    3  HTN  BP high yesterday  SOme better today  Labile   Would increase coreg   4  Anemia   Hgb 6.8  Transfuse to 7 at least      5  GI  EGD with bleeding ulcer and CMV     6  Renal  On  peritoneal dialysis    For questions or updates, please contact [facility] Please  consult [email or web address] for contact info under        Signed, Dorris Carnes, MD  01/03/2020, 11:32 AM

## 2020-01-04 DIAGNOSIS — R778 Other specified abnormalities of plasma proteins: Secondary | ICD-10-CM

## 2020-01-04 LAB — GLUCOSE, CAPILLARY
Glucose-Capillary: 181 mg/dL — ABNORMAL HIGH (ref 70–99)
Glucose-Capillary: 120 mg/dL — ABNORMAL HIGH (ref 70–99)

## 2020-01-04 LAB — CBC WITH DIFFERENTIAL/PLATELET
Abs Immature Granulocytes: 0.02 10*3/uL (ref 0.00–0.07)
Basophils Absolute: 0 10*3/uL (ref 0.0–0.1)
Basophils Relative: 0 %
Eosinophils Absolute: 0.5 10*3/uL (ref 0.0–0.5)
Eosinophils Relative: 9 %
HCT: 24.7 % — ABNORMAL LOW (ref 39.0–52.0)
Hemoglobin: 8.8 g/dL — ABNORMAL LOW (ref 13.0–17.0)
Immature Granulocytes: 0 %
Lymphocytes Relative: 25 %
Lymphs Abs: 1.4 10*3/uL (ref 0.7–4.0)
MCH: 32 pg (ref 26.0–34.0)
MCHC: 35.6 g/dL (ref 30.0–36.0)
MCV: 89.8 fL (ref 80.0–100.0)
Monocytes Absolute: 0.6 10*3/uL (ref 0.1–1.0)
Monocytes Relative: 10 %
Neutro Abs: 3 10*3/uL (ref 1.7–7.7)
Neutrophils Relative %: 56 %
Platelets: 198 10*3/uL (ref 150–400)
RBC: 2.75 MIL/uL — ABNORMAL LOW (ref 4.22–5.81)
RDW: 16 % — ABNORMAL HIGH (ref 11.5–15.5)
WBC: 5.5 10*3/uL (ref 4.0–10.5)
nRBC: 0 % (ref 0.0–0.2)

## 2020-01-04 LAB — COMPREHENSIVE METABOLIC PANEL
ALT: 15 U/L (ref 0–44)
AST: 19 U/L (ref 15–41)
Albumin: 2.9 g/dL — ABNORMAL LOW (ref 3.5–5.0)
Alkaline Phosphatase: 58 U/L (ref 38–126)
Anion gap: 14 (ref 5–15)
BUN: 66 mg/dL — ABNORMAL HIGH (ref 6–20)
CO2: 25 mmol/L (ref 22–32)
Calcium: 8.3 mg/dL — ABNORMAL LOW (ref 8.9–10.3)
Chloride: 90 mmol/L — ABNORMAL LOW (ref 98–111)
Creatinine, Ser: 15.62 mg/dL — ABNORMAL HIGH (ref 0.61–1.24)
GFR calc Af Amer: 4 mL/min — ABNORMAL LOW (ref >60)
GFR calc non Af Amer: 3 mL/min — ABNORMAL LOW (ref >60)
Glucose, Bld: 180 mg/dL — ABNORMAL HIGH (ref 70–99)
Potassium: 3.3 mmol/L — ABNORMAL LOW (ref 3.5–5.1)
Sodium: 129 mmol/L — ABNORMAL LOW (ref 135–145)
Total Bilirubin: 0.8 mg/dL (ref 0.3–1.2)
Total Protein: 5.8 g/dL — ABNORMAL LOW (ref 6.5–8.1)

## 2020-01-04 LAB — MAGNESIUM: Magnesium: 2.1 mg/dL (ref 1.7–2.4)

## 2020-01-04 LAB — PHOSPHORUS: Phosphorus: 5.3 mg/dL — ABNORMAL HIGH (ref 2.5–4.6)

## 2020-01-04 MED ORDER — ONDANSETRON 4 MG PO TBDP: 4.0000 mg | ORAL_TABLET | Freq: Three times a day (TID) | ORAL | 0 refills | Status: DC | PRN

## 2020-01-04 MED ORDER — INSULIN GLARGINE 100 UNIT/ML ~~LOC~~ SOLN: 10.0000 [IU] | Freq: Every day | SUBCUTANEOUS | 11 refills | Status: DC

## 2020-01-04 MED ORDER — VALGANCICLOVIR HCL 50 MG/ML PO SOLR
200.0000 mg | Freq: Once | ORAL | Status: DC
  Filled 2020-01-04: qty 4

## 2020-01-04 MED ORDER — SUCRALFATE 1 GM/10ML PO SUSP: 1.0000 g | Freq: Two times a day (BID) | ORAL | 0 refills | Status: DC

## 2020-01-04 MED ORDER — VALGANCICLOVIR HCL 50 MG/ML PO SOLR
200.0000 mg | Freq: Once | ORAL | Status: AC
  Administered 2020-01-04: 200 mg via ORAL
  Filled 2020-01-04: qty 4

## 2020-01-04 MED ORDER — POTASSIUM CHLORIDE CRYS ER 20 MEQ PO TBCR
40.0000 meq | EXTENDED_RELEASE_TABLET | Freq: Once | ORAL | Status: AC
  Administered 2020-01-04: 40 meq via ORAL
  Filled 2020-01-04: qty 2

## 2020-01-04 MED ORDER — MENTHOL 3 MG MT LOZG: 1.0000 | LOZENGE | OROMUCOSAL | 12 refills | Status: DC | PRN

## 2020-01-04 NOTE — Progress Notes (Signed)
Donald [name]  MRN: [id number]  DOB/AGE: 06-02-1970 49 y.o.  Primary Care Physician:Patient, [name]  Admit date: 12/31/2019  Chief Complaint:  Chief Complaint  Patient presents with  . Dizziness    S-Pt presented on  12/31/2019 with  Chief Complaint  Patient presents with  . Dizziness  . Patient offers no new complaints   Medications . sodium chloride   Intravenous Once  . sodium chloride   Intravenous Once  . carvedilol  12.5 mg Oral BID WC  . gentamicin cream  1 application Topical Daily  . insulin aspart  0-9 Units Subcutaneous TID AC & HS  . insulin glargine  10 Units Subcutaneous Daily  . NIFEdipine  60 mg Oral Daily  . pantoprazole (PROTONIX) IV  40 mg Intravenous Q12H  . paricalcitol  3 mcg Oral Daily  . potassium chloride  40 mEq Oral Once  . rosuvastatin  10 mg Oral Daily  . senna  1 tablet Oral Daily  . sucralfate  1 g Oral BID  . sucroferric oxyhydroxide  500 mg Oral TID WC  . [START ON 01/06/2020] valganciclovir  200 mg Oral Q Tue  . valganciclovir  200 mg Oral Q Fri         YBO:FBPZW from the symptoms mentioned above,there are no other symptoms referable to all systems reviewed.  Physical Exam: Vital signs in last 24 hours: Temp:  [98 F (36.7 C)-98.8 F (37.1 C)] 98.7 F (37.1 C) (09/26 0917) Pulse Rate:  [73-80] 79 (09/26 0917) Resp:  [16-18] 18 (09/26 0917) BP: (99-182)/(47-86) 156/74 (09/26 0917) SpO2:  [96 %-100 %] 98 % (09/26 0917) Weight:  [111.5 kg] 111.5 kg (09/26 0500) Weight change: -1 kg Last BM Date: 01/02/20  Intake/Output from previous day: 09/25 0701 - 09/26 0700 In: 1189.3 [I.V.:109.3; Blood:1080] Out: -  No intake/output data recorded.   Physical Exam: General- pt is awake,alert, oriented to time place and person Resp- No acute REsp distress, CTA B/L NO Rhonchi CVS- S1S2 regular in rate and rhythm GIT- BS+, soft, NT, ND, PD catheter in situ EXT- NO LE Edema, Cyanosis Access AV fistula in situ  Lab  Results: CBC Recent Labs    01/03/20 0500 01/04/20 0730  WBC 6.5 5.5  HGB 6.8* 8.8*  HCT 20.2* 24.7*  PLT 192 198    BMET Recent Labs    01/03/20 0500 01/04/20 0730  NA 134* 129*  K 3.1* 3.3*  CL 93* 90*  CO2 26 25  GLUCOSE 177* 180*  BUN 76* 66*  CREATININE 15.89* 15.62*  CALCIUM 8.5* 8.3*    MICRO Recent Results (from the past 240 hour(s))  SARS Coronavirus 2 by RT PCR (hospital order, performed in [facility] hospital lab) Nasopharyngeal Nasopharyngeal Swab     Status: None   Collection Time: 12/31/19  2:42 PM   Specimen: Nasopharyngeal Swab  Result Value Ref Range Status   SARS Coronavirus 2 NEGATIVE NEGATIVE Final    Comment: (NOTE) SARS-CoV-2 target nucleic acids are NOT DETECTED.  The SARS-CoV-2 RNA is generally detectable in upper and lower respiratory specimens during the acute phase of infection. The lowest concentration of SARS-CoV-2 viral copies this assay can detect is 250 copies / mL. A negative result does not preclude SARS-CoV-2 infection and should not be used as the sole basis for treatment or other patient management decisions.  A negative result may occur with improper specimen collection / handling, submission of specimen other than nasopharyngeal swab, presence of viral mutation(s) within the  areas targeted by this assay, and inadequate number of viral copies (<250 copies / mL). A negative result must be combined with clinical observations, patient history, and epidemiological information.  Fact Sheet for Patients:   StrictlyIdeas.no  Fact Sheet for Healthcare Providers: BankingDealers.co.za  This test is not yet approved or  cleared by the [place] FDA and has been authorized for detection and/or diagnosis of SARS-CoV-2 by FDA under an Emergency Use Authorization (EUA).  This EUA will remain in effect (meaning this test can be used) for the duration of the COVID-19 declaration under  Section 564(b)(1) of the Act, 21 U.S.C. section 360bbb-3(b)(1), unless the authorization is terminated or revoked sooner.  Performed at [facility], [address] [place], [place] [address]   MRSA PCR Screening     Status: None   Collection Time: 01/01/20  4:19 AM   Specimen: Nasopharyngeal  Result Value Ref Range Status   MRSA by PCR NEGATIVE NEGATIVE Final    Comment:        The GeneXpert MRSA Assay (FDA approved for NASAL specimens only), is one component of a comprehensive MRSA colonization surveillance program. It is not intended to diagnose MRSA infection nor to guide or monitor treatment for MRSA infections. Performed at [facility], [address] [place], [place] [address]       Lab Results  Component Value Date   PTH 652 (H) 07/16/2019   CALCIUM 8.3 (L) 01/04/2020   PHOS 5.3 (H) 01/04/2020               Impression:   Donald [name] is a 49 y.o. black male with end stage renal disease on peritoneal dialysis, peptic ulcer disease, diabetes mellitus type II insulin dependent, hypertension, coronary artery disease status post CABG, who is admitted to [facility] on 12/31/2019 for Hematemesis [K92.0] Acute upper GI bleed [K92.2] GI bleed [K92.2]  1)Renal End-stage renal disease Patient is on peritoneal dialysis [facility] 113kg left AVF CCPD 10 hours 5 cycles 298mL with last fill 2019mL extraneal.  Patient is to continue to be on PD  2)HTN Blood pressure is now much better controlled  3)Anemia of chronic disease admitted with GI bleed  HGb is not at goal (9--11) Patient received PRBC yesterday Now better   4) secondary hyperparathyroidism -CKD Mineral-Bone Disorder   Secondary Hyperparathyroidism present Patient was on cinacalcet as an outpatient Patient Sensipar is being held secondary to GI bleed/GI symptoms Phosphorus near to the goal .   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

## 2020-01-04 NOTE — Discharge Summary (Signed)
Physician Discharge Summary  Woodroe Vogan HUD:[id number] DOB: May 05, 1970 DOA: 12/31/2019  PCP: Patient, [name]  Admit date: 12/31/2019 Discharge date: 01/04/2020  Admitted From: Home Disposition: Home  Recommendations for Outpatient Follow-up:  1. Follow up with PCP in 1-2 weeks 2. Follow-up with cardiology [facility] within 1 to 2 weeks 3. Follow-up with gastroenterology Dr. Haig Prophet within 1 to 2 weeks at discharge 4. Follow-up with nephrology for maintenance of peritoneal dialysis 5. Please obtain CMP/CBC, Mag, Phos in one week 6. Please follow up on the following pending results:  Home Health: No Equipment/Devices: None    Discharge Condition: Stable  CODE STATUS: FULL CODE Diet recommendation: Heart Healthy Renal Carb Modified Diet with 1500 mL Fluid Restriction  Brief/Interim Summary: HPI per Dr. Ivor Costa on 12/31/19  CHY:IFOYDX Evansis a 49 y.o.malewith medical history significant ofESRD on peritoneal dialysis, HTN, HLD, DM, esophageal ulcer, who presents with hematemesis.  Pt states that hehe has had several episodes of vomiting blood with dark-colored blood since this morning. Patient had another episode of hematemesis in the ED with approximate 400cc vomitus.Denies abdominal pain or bloody stool. Patient has mild cough, no shortness breath or chest pain. Patient has dizziness and generalized weakness. No symptoms of UTI or unilateral weakness. Of note, pt [facility] [place] where he was found to haveesophageal ulcers that were biopsied and he was diagnosed with CMV.Pt is taking ASA 81 mg daily. Not takingNSAID.  ED Course:pt was found to have hemoglobin 6.1 (8. 6on 07/16/2019), pending COVID-19 PCR, potassium 4.1, bicarbonate 26, creatinine 14.5, BUN 71. Temperature normal. Blood pressure 127/55, heart rate 88, RR 16, oxygen saturation 100%. Patient is placed on MedSurg bed of observation.  Check talked Locklear is consulted.  **Interim History Patient's hemoglobin still was not above 7 and was will be typed and screened and transfused 1 more unit but GI recommended holding against this and recommended getting peritoneal dialysis tonight and repeating blood count in the morning and then transfusing at that time.  Anesthesia would not take him today for his EGD with a hemoglobin less than 7 so GI recommended feeding the patient tonight and watching carefully.  Nephrology planning on doing peritoneal dialysis cycle tonight.  *9/23-9/24 overnight he had intractable nausea vomiting he went into hypertensive urgency.  He was given several antiemetics and find given tenogram and Ativan which helped him calm down but his blood pressure still elevated.  He is started on Nitropaste and hydralazine.  Cardiology was consulted given his elevation in troponin and they feel it is demand ischemia in the setting of his known coronary disease.  They felt that he was an acceptable risk for an EGD but not a good candidate for anticoagulation at this time given his GI bleeding.  Cardiology recommended increasing the dose of nifedipine if it continues to run high.  Nephrology planning on peritoneal dialysis again tonight.  Patient's blood count was low this morning's dose was given in the 1 unit.  After the transfusion his hemoglobin was above 7 and was 7.3.  EGD was done and it showed esophageal ulcers with stigmata of recent bleeding which was biopsied and this was consistent with previously diagnosed CMV esophagitis.  GI recommended resuming his previous diet and using sucralfate 1 g p.o. twice daily  01/03/20 Patient was feeling weak this morning and his blood count had dropped again is 6.8.  We will type and screen and transfuse 2 more units of PRBCs  and give Lasix in between the units.  Unfortunately the nurse gave the Lasix before the blood was hung and we have ordered another 20 mg of IV Lasix in  between the units of bloo  Patient's blood pressure is better controlled.  Potassium this morning was low.  Nephrology continuing peritoneal dialysis today.  *01/04/20 Patient's hemoglobin/hematocrit has improved significantly after the 2 units of PRBCs.  He was still a little nauseous but it was relatively well controlled with antiemetics.  He had no more hematemesis but still has some dark stools this is secondary to likely old blood.  He is cleared by nephrology, cardiology and gastroenterology perspective and he is going to be sent home on twice daily PPI, sucralfate twice daily, as well as treatment for CMV ulcer with valacyclovir.  Cardiology signed off the case and recommended continue aspirin and GI was okay with him going aspirin.  He will need to follow-up with PCP, cardiology, gastroenterology as well as nephrology outpatient setting.  Nephrology will be arranging continuity of his peritoneal dialysis.  He has been deemed stable to be discharged at this time and follow-up with the respective specialist as above  Discharge Diagnoses:  Principal Problem:   Hematemesis Active Problems:   Type II diabetes mellitus with renal manifestations ([place])   Hypertension   ESRD on peritoneal dialysis ([place])   HLD (hyperlipidemia)   GI bleed   Elevated troponin  Hematemesis in the setting of likely esophageal ulcer and previously known CMV esophagitis. ,  Improved -Patient's Hgb 8.6 -->6.1. Dr. Haig Prophet of is consulted and took the patient for an EGD yesterday.  Hemoglobin/hematocrit is now stable 8.8/24.7 -He was placed in med-surg bed obs but will change to inpatient - hold ASA -Status post 5 units of PRBCs transfused total - GI consulted by Ed, will follow up recommendations - NPOafter MNfor EGD  - IVF:71mL/hrof NS stopped; given IV Lasix between units however the nurse given before first blood was hung so we will order it again - Start IV pantoprazole40 mg bid and will continue and changed  to p.o. twice daily -Continue with Vantin ganciclovir 200 mg p.o. every Tuesday and Friday for history of CMV  - Zofran IV for nausea - Avoid NSAIDs and SQ heparin - Maintain IV access (2 large bore IVs if possible). - Monitor closely and follow q6h cbc, transfuse as necessary, if Hgb<7.0 - LaB: INR, PTT and type screen; PT was 14.5 and INR 1.2 -Continue monitor CBCs every 6 hours and continue monitor for signs of bleeding; because he is not actively bleeding currently GI recommending resuming his renal diet -He was given another unit of PRBCs given his low hemoglobin this morning -EGD done and showed was normal but he had esophageal ulcers with stigmata of recent bleeding which were biopsied and consistent with previously diagnosed CMV esophagitis.  There is also increased vascular pattern mucosa in the incisura which was also biopsied.  Gastroenterology recommending continuing CMV medications, GERD medications as well as adding sucralfate 1 g p.o. twice daily and will continue at discharge  Intractable nausea and vomiting, improved and patient was mildly nauseous today -In the setting of his CMV esophagitis -Continue with antiemetics and had IV Compazine while hospitalized; will be sent home on p.o. Zofran -His intractable nausea and vomiting led to his hypertensive urgency the night before last which is now improved significantly -Continue supportive care and appreciated GI evaluation recommendations and they had no further recommendations.  I spoke with nephrology in case  this is secondary to uremia and they feel that this is secondary to his CMV esophagitis and esophageal ulcer and feel that we will just take time supportive care.  Patient is stable for discharge at this time and following up with PCP, and gastroenterology outpatient setting  Type II diabetes mellitus with renal manifestations ([place]) -Recent A1c 6.1, well controlled. Patient is taking NovoLog and Lantus at home -Continue  with sensitive NovoLog/scale insulin before meals and at bedtime -Decreased Lantus from 15 to 10 units daily -CBGs are ranging from  120-181  Hypertension with hypertensive urgency, now improved -IV hydralazine 5 mg every 2 hours as needed for high blood pressure for systolic blood pressure of 165 as needed and this was increased -Continue home Carvedilol 12.5 mg p.o. twice daily, Nifedipine 60 mg p.o. daily -Continue to monitor blood pressures per protocol and he became hypertensive urgency last night and was given Nitropaste -Last blood pressure was  156/74  ESRD on peritoneal dialysis [facility]) Hyperphosphatemia Secondary hyperparathyroidism -Dr. Juleen China of renal is consulted for peritoneal dialysis and he will be getting peritoneal dialysis today -Patient's BUN/creatinine is now 76/15.89 -Continue with paricalcitol 3 mcg p.o. daily as well as VELPHORO 500 mg p.o. 3 times daily; cinacalcet 30 mg p.o. daily with breakfast was held due to his GI bleeding and I spoke with nephrology and they are recommending holding against it for now -Patient to undergo peritoneal dialysis again and nephrology is closely following and will arrange follow-up at discharge  Elevated Troponin/ Hx of CAD s/p CABG -In the setting of demand ischemia -Patient's troponin went from 78 and went all the way to 790 -Cardiology Consulted and appreciated recc's and they feel this is demand ischemia in the setting of his hypertensive urgency -Cardiology is to follow-up with the patient in outpatient setting in 1 to 2 weeks  HLD (Hyperlipidemia) -C/w Rosuvastatin 40 mg po Daily  Hypokalemia  -Mild at 3.3 -Replete with p.o. KCl 40 mEq x 1 prior to discharge -Continue monitor and replete as necessary  -Repeat CMP in a.m.  Hyponatremia -Mild and Nephrology is also following; sodium is dropped from 134 down to 129 and nephrology feels that this is relatively stable and okay to be discharged -Continue to monitor and  trend and repeat CMP in the outpatient setting  Obesity -Complicates his prognosis and care -Estimated body mass index is 35.27 kg/m as calculated from the following:   Height as of this encounter: 5\' 10"  (1.778 m).   Weight as of this encounter: 111.5 kg. -Weight loss and Dietary Counseling given   Discharge Instructions  Discharge Instructions    Call MD for:  difficulty breathing, headache or visual disturbances   Complete by: As directed    Call MD for:  extreme fatigue   Complete by: As directed    Call MD for:  hives   Complete by: As directed    Call MD for:  persistant dizziness or light-headedness   Complete by: As directed    Call MD for:  persistant nausea and vomiting   Complete by: As directed    Call MD for:  redness, tenderness, or signs of infection (pain, swelling, redness, odor or green/yellow discharge around incision site)   Complete by: As directed    Call MD for:  severe uncontrolled pain   Complete by: As directed    Call MD for:  temperature >100.4   Complete by: As directed    Diet - low sodium heart healthy  Complete by: As directed    Renal Carb Modified Diet with 1500 Fluid Restriction   Diet Carb Modified   Complete by: As directed    Discharge instructions   Complete by: As directed    You were cared for by a hospitalist during your hospital stay. If you have any questions about your discharge medications or the care you received while you were in the hospital after you are discharged, you can call the unit and ask to speak with the hospitalist on call if the hospitalist that took care of you is not available. Once you are discharged, your primary care physician will handle any further medical issues. Please note that NO REFILLS for any discharge medications will be authorized once you are discharged, as it is imperative that you return to your primary care physician (or establish a relationship with a primary care physician if you do not have one)  for your aftercare needs so that they can reassess your need for medications and monitor your lab values.  Follow up with PCP, Nephrology, Cardiology, and Gastroenterology within 1-2 weeks. Take all medications as prescribed. If symptoms change or worsen please return to the ED for evaluation   Increase activity slowly   Complete by: As directed    No wound care   Complete by: As directed      Allergies as of 01/04/2020      Reactions   Lisinopril Hives   Other    Other reaction(s): EGGPLANT      Medication List    STOP taking these medications   cinacalcet 30 MG tablet Commonly known as: SENSIPAR     TAKE these medications   aspirin 81 MG EC tablet Take 1 tablet (81 mg total) by mouth daily.   carvedilol 12.5 MG tablet Commonly known as: COREG Take 1 tablet (12.5 mg total) by mouth 2 (two) times daily with a meal.   dialysis solution 2.5% low-MG/low-CA 394 MOSM/L Soln dianeal solution As per nephro   extraneal (ICODEXTRIN) peritoneal dialysis solution 7.5 % As directed by nephro   furosemide 80 MG tablet Commonly known as: LASIX Take 1 tablet (80 mg total) by mouth 2 (two) times daily.   insulin aspart 100 UNIT/ML FlexPen Commonly known as: NOVOLOG Inject 6 Units into the skin 3 (three) times daily with meals.   insulin glargine 100 UNIT/ML injection Commonly known as: Lantus Inject 0.1 mLs (10 Units total) into the skin daily. What changed:   how much to take  Another medication with the same name was removed. Continue taking this medication, and follow the directions you see here.   Insulin Pen Needle 29G X 10MM Misc 1 each by Does not apply route in the morning, at noon, and at bedtime.   menthol-cetylpyridinium 3 MG lozenge Commonly known as: CEPACOL Take 1 lozenge (3 mg total) by mouth as needed for sore throat.   NIFEdipine 60 MG 24 hr tablet Commonly known as: ADALAT CC Take 60 mg by mouth 2 (two) times daily.   ondansetron 4 MG disintegrating  tablet Commonly known as: Zofran ODT Take 1 tablet (4 mg total) by mouth every 8 (eight) hours as needed for nausea or vomiting.   pantoprazole 40 MG tablet Commonly known as: PROTONIX Take 40 mg by mouth 2 (two) times daily.   paricalcitol 1 MCG capsule Commonly known as: ZEMPLAR Take 3 capsules (3 mcg total) by mouth daily.   PHOSLO PO Take 4 capsules by mouth 3 (three) times daily  with meals.   rosuvastatin 10 MG tablet Commonly known as: CRESTOR Take 10 mg by mouth at bedtime.   senna 8.6 MG tablet Commonly known as: SENOKOT Take 2 tablets (17.2 mg total) by mouth daily.   sucralfate 1 GM/10ML suspension Commonly known as: CARAFATE Take 10 mLs (1 g total) by mouth 2 (two) times daily.   valganciclovir 50 MG/ML Solr Commonly known as: VALCYTE Take 4 mLs by mouth 2 (two) times a week. Tuesday and Friday   Velphoro 500 MG chewable tablet Generic drug: sucroferric oxyhydroxide Chew 1 tablet (500 mg total) by mouth 3 (three) times daily with meals.       Follow-up Information    Liana Gerold, MD Follow up.   Specialty: Nephrology Why: Follow up within 1-2 weeks for Maintenance of Dialysis Contact information: [address] [place] [address] [phone]        Fay Records, MD. Call.   Specialty: Cardiology Why: Follow up within 1-2 weeks Contact information: [address] [address] [phone]        Lesly Rubenstein, MD. Call.   Specialty: Gastroenterology Why: Follow up within 1-2 weeks Contact information: [place] [address] [phone]              Allergies  Allergen Reactions  . Lisinopril Hives  . Other     Other reaction(s): EGGPLANT   Consultations:  Gastroenterology  Nephrology  Cardiology  Procedures/Studies: DG Abd 1 View  Result Date: 01/01/2020 CLINICAL DATA:  Intractable vomiting. EXAM: ABDOMEN - 1 VIEW COMPARISON:  None. FINDINGS: No  evidence of free intra-abdominal air. Air throughout nondilated bowel loops in the right abdomen. No bowel dilatation to suggest obstruction. Small volume of formed stool in the sigmoid colon. Innumerable pelvic calcifications typical of phleboliths. There also vascular calcifications. Median sternotomy wires in the lower chest partially included. The heart is enlarged. Degenerative change of both hips. Peritoneal dialysis catheter projects over the lower abdomen. IMPRESSION: 1. Nonobstructive bowel gas pattern. 2. Peritoneal dialysis catheter in the lower abdomen. Electronically Signed   By: Keith Rake M.D.   On: 01/01/2020 23:48    EGD Findings: Few superficial esophageal ulcers with stigmata of recent bleeding were  found. Biopsies were taken with a cold forceps for histology. Biopsies  taken from center of one lesions. Estimated blood loss was minimal.  Consistent with previously diagnosed CMV esophagitis Localized mild mucosal changes characterized by an increased vascular  pattern were found at the incisura. Biopsies were taken with a cold  forceps for histology. Estimated blood loss was minimal. The examined duodenum was normal. Impression: - Esophageal ulcers with stigmata of recent bleeding.  Biopsied. Consistent with previously diagnosed CMV  esophagitis - Increased vascular pattern mucosa in the incisura.  Biopsied. - Normal examined duodenum  Subjective: Seen and examined at bedside and he is little sleepy this morning and just a rough night and he did not sleep last night but he felt better this afternoon.  Was little nauseous earlier but his nausea was manageable.  Denies any chest pain, lightheadedness or dizziness.  Still has some dark bowel movements but likely old blood.  No active bleeding as hemoglobin is  stable 8.8.  Cardiology, nephrology, gastroenterology have all cleared for discharge and patient stable at this time.  All questions were answered and he will be discharged in have follow-up with the respective consultants as above.   Discharge Exam: Vitals:  01/04/20 0009 01/04/20 0917  BP: (!) 115/47 (!) 156/74  Pulse: 74 79  Resp: 17 18  Temp: 98.7 F (37.1 C) 98.7 F (37.1 C)  SpO2: 96% 98%   Vitals:   01/03/20 2040 01/04/20 0009 01/04/20 0500 01/04/20 0917  BP: (!) 144/70 (!) 115/47  (!) 156/74  Pulse: 75 74  79  Resp: 16 17  18   Temp: 98.8 F (37.1 C) 98.7 F (37.1 C)  98.7 F (37.1 C)  TempSrc: Oral Oral  Oral  SpO2: 100% 96%  98%  Weight:   111.5 kg   Height:       General: Pt is a little sleepy but easily awoken, not in acute distress Cardiovascular: RRR, S1/S2 +, no rubs, no gallops Respiratory: Diminished bilaterally, no wheezing, no rhonchi Abdominal: Soft, NT, distended secondary body habitus, bowel sounds + Extremities: Trace edema, no cyanosis  The results of significant diagnostics from this hospitalization (including imaging, microbiology, ancillary and laboratory) are listed below for reference.    Microbiology: Recent Results (from the past 240 hour(s))  SARS Coronavirus 2 by RT PCR (hospital order, performed in [facility] hospital lab) Nasopharyngeal Nasopharyngeal Swab     Status: None   Collection Time: 12/31/19  2:42 PM   Specimen: Nasopharyngeal Swab  Result Value Ref Range Status   SARS Coronavirus 2 NEGATIVE NEGATIVE Final    Comment: (NOTE) SARS-CoV-2 target nucleic acids are NOT DETECTED.  The SARS-CoV-2 RNA is generally detectable in upper and lower respiratory specimens during the acute phase of infection. The lowest concentration of SARS-CoV-2 viral copies this assay can detect is 250 copies / mL. A negative result does not preclude SARS-CoV-2 infection and should not be used as the sole basis for treatment or other patient  management decisions.  A negative result may occur with improper specimen collection / handling, submission of specimen other than nasopharyngeal swab, presence of viral mutation(s) within the areas targeted by this assay, and inadequate number of viral copies (<250 copies / mL). A negative result must be combined with clinical observations, patient history, and epidemiological information.  Fact Sheet for Patients:   StrictlyIdeas.no  Fact Sheet for Healthcare Providers: BankingDealers.co.za  This test is not yet approved or  cleared by the [place] FDA and has been authorized for detection and/or diagnosis of SARS-CoV-2 by FDA under an Emergency Use Authorization (EUA).  This EUA will remain in effect (meaning this test can be used) for the duration of the COVID-19 declaration under Section 564(b)(1) of the Act, 21 U.S.C. section 360bbb-3(b)(1), unless the authorization is terminated or revoked sooner.  Performed at [facility], [address] [place], [place] [address]   MRSA PCR Screening     Status: None   Collection Time: 01/01/20  4:19 AM   Specimen: Nasopharyngeal  Result Value Ref Range Status   MRSA by PCR NEGATIVE NEGATIVE Final    Comment:        The GeneXpert MRSA Assay (FDA approved for NASAL specimens only), is one component of a comprehensive MRSA colonization surveillance program. It is not intended to diagnose MRSA infection nor to guide or monitor treatment for MRSA infections. Performed at [facility], [address] [place],  [address]     Labs: BNP (last 3 results) No results for input(s): BNP in the last 8760 hours. Basic Metabolic Panel: Recent Labs  Lab 12/31/19 1248 01/01/20 0406 01/02/20 0356 01/03/20 0500 01/04/20 0730  NA 131* 134* 132* 134* 129*  K  4.1 3.8 3.7 3.1* 3.3*  CL 94* 95* 93* 93* 90*  CO2 26 25 25 26 25   GLUCOSE 311* 161* 331* 177* 180*   BUN 71* 82* 78* 76* 66*  CREATININE 14.53* 15.62* 15.70* 15.89* 15.62*  CALCIUM 7.9* 8.3* 8.7* 8.5* 8.3*  MG  --   --  2.2 2.0 2.1  PHOS  --   --  4.6 5.6* 5.3*   Liver Function Tests: Recent Labs  Lab 01/02/20 0356 01/03/20 0500 01/04/20 0730  AST 12* 13* 19  ALT 14 13 15   ALKPHOS 48 47 58  BILITOT 0.7 0.6 0.8  PROT 5.8* 5.4* 5.8*  ALBUMIN 2.7* 2.6* 2.9*   No results for input(s): LIPASE, AMYLASE in the last 168 hours. No results for input(s): AMMONIA in the last 168 hours. CBC: Recent Labs  Lab 01/01/20 0406 01/01/20 0406 01/01/20 0831 01/01/20 0831 01/01/20 1232 01/02/20 0356 01/02/20 1141 01/03/20 0500 01/04/20 0730  WBC 7.4  --  7.0  --   --  6.0  --  6.5 5.5  NEUTROABS  --   --   --   --   --  4.6  --  3.6 3.0  HGB 6.2*   < > 6.3*   < > 6.8* 6.6* 7.3* 6.8* 8.8*  HCT 17.3*   < > 18.5*   < > 20.4* 19.9* 21.2* 20.2* 24.7*  MCV 89.2  --  90.7  --   --  90.5  --  92.7 89.8  PLT 210  --  206  --   --  202  --  192 198   < > = values in this interval not displayed.   Cardiac Enzymes: No results for input(s): CKTOTAL, CKMB, CKMBINDEX, TROPONINI in the last 168 hours. BNP: Invalid input(s): POCBNP CBG: Recent Labs  Lab 01/03/20 1151 01/03/20 1724 01/03/20 2116 01/04/20 0804 01/04/20 1146  GLUCAP 148* 134* 120* 181* 120*   D-Dimer No results for input(s): DDIMER in the last 72 hours. Hgb A1c No results for input(s): HGBA1C in the last 72 hours. Lipid Profile No results for input(s): CHOL, HDL, LDLCALC, TRIG, CHOLHDL, LDLDIRECT in the last 72 hours. Thyroid function studies No results for input(s): TSH, T4TOTAL, T3FREE, THYROIDAB in the last 72 hours.  Invalid input(s): FREET3 Anemia work up No results for input(s): VITAMINB12, FOLATE, FERRITIN, TIBC, IRON, RETICCTPCT in the last 72 hours. Urinalysis No results found for: COLORURINE, APPEARANCEUR, [place], [place], [place], [place], [place], [place], PROTEINUR, UROBILINOGEN, NITRITE,  LEUKOCYTESUR Sepsis Labs Invalid input(s): PROCALCITONIN,  WBC,  LACTICIDVEN Microbiology Recent Results (from the past 240 hour(s))  SARS Coronavirus 2 by RT PCR (hospital order, performed in [facility] hospital lab) Nasopharyngeal Nasopharyngeal Swab     Status: None   Collection Time: 12/31/19  2:42 PM   Specimen: Nasopharyngeal Swab  Result Value Ref Range Status   SARS Coronavirus 2 NEGATIVE NEGATIVE Final    Comment: (NOTE) SARS-CoV-2 target nucleic acids are NOT DETECTED.  The SARS-CoV-2 RNA is generally detectable in upper and lower respiratory specimens during the acute phase of infection. The lowest concentration of SARS-CoV-2 viral copies this assay can detect is 250 copies / mL. A negative result does not preclude SARS-CoV-2 infection and should not be used as the sole basis for treatment or other patient management decisions.  A negative result may occur with improper specimen collection / handling, submission of specimen other than nasopharyngeal swab, presence of viral mutation(s) within the areas targeted by this assay, and inadequate number of  viral copies (<250 copies / mL). A negative result must be combined with clinical observations, patient history, and epidemiological information.  Fact Sheet for Patients:   StrictlyIdeas.no  Fact Sheet for Healthcare Providers: BankingDealers.co.za  This test is not yet approved or  cleared by the [place] FDA and has been authorized for detection and/or diagnosis of SARS-CoV-2 by FDA under an Emergency Use Authorization (EUA).  This EUA will remain in effect (meaning this test can be used) for the duration of the COVID-19 declaration under Section 564(b)(1) of the Act, 21 U.S.C. section 360bbb-3(b)(1), unless the authorization is terminated or revoked sooner.  Performed at [facility], [address] [place], [place] [address]   MRSA PCR Screening      Status: None   Collection Time: 01/01/20  4:19 AM   Specimen: Nasopharyngeal  Result Value Ref Range Status   MRSA by PCR NEGATIVE NEGATIVE Final    Comment:        The GeneXpert MRSA Assay (FDA approved for NASAL specimens only), is one component of a comprehensive MRSA colonization surveillance program. It is not intended to diagnose MRSA infection nor to guide or monitor treatment for MRSA infections. Performed at [facility] [place] [facility], [address], [place], [place] [address]    Time coordinating discharge: 35 minutes  SIGNED:  Kerney Elbe, DO [facility] 01/04/2020, 6:37 PM Pager is on [place]  If 7PM-7AM, please contact night-coverage [email or web address]

## 2020-01-04 NOTE — Progress Notes (Signed)
Pt is alert and orientated. Is up not able to seep. Pt stated he slept all day and now cant sleep. Is receiving  Peritoneal dialysis at bedside. Pt takes himself off when finished in am. No c/o nausea or vomiting at this time. No c/o pain or acute distress noted.

## 2020-01-04 NOTE — Progress Notes (Signed)
Patient discharging home. Discharge instructions given to patient, verbalized understanding. IV removed.

## 2020-01-04 NOTE — Progress Notes (Addendum)
Progress Note  Patient Name: Donald [name] Date of Encounter: 01/04/2020  [facility] Cardiologist: New  Subjective   Patient comfortable laying in bed  No CP  No SOB   Inpatient Medications    Scheduled Meds:  sodium chloride   Intravenous Once   sodium chloride   Intravenous Once   carvedilol  12.5 mg Oral BID WC   gentamicin cream  1 application Topical Daily   insulin aspart  0-9 Units Subcutaneous TID AC & HS   insulin glargine  10 Units Subcutaneous Daily   NIFEdipine  60 mg Oral Daily   pantoprazole (PROTONIX) IV  40 mg Intravenous Q12H   paricalcitol  3 mcg Oral Daily   rosuvastatin  10 mg Oral Daily   senna  1 tablet Oral Daily   sucralfate  1 g Oral BID   sucroferric oxyhydroxide  500 mg Oral TID WC   [START ON 01/06/2020] valganciclovir  200 mg Oral Q Tue   valganciclovir  200 mg Oral Q Fri   Continuous Infusions:  dialysis solution 1.5% low-MG/low-CA     dialysis solution 2.5% low-MG/low-CA     PRN Meds: acetaminophen, hydrALAZINE, menthol-cetylpyridinium, ondansetron (ZOFRAN) IV, phenol, prochlorperazine   Vital Signs    Vitals:   01/03/20 2040 01/04/20 0009 01/04/20 0500 01/04/20 0917  BP: (!) 144/70 (!) 115/47  (!) 156/74  Pulse: 75 74  79  Resp: 16 17  18   Temp: 98.8 F (37.1 C) 98.7 F (37.1 C)  98.7 F (37.1 C)  TempSrc: Oral Oral  Oral  SpO2: 100% 96%  98%  Weight:   111.5 kg   Height:        Intake/Output Summary (Last 24 hours) at 01/04/2020 0920 Last data filed at 01/03/2020 2040 Gross per 24 hour  Intake 1189.33 ml  Output --  Net 1189.33 ml   Last 3 Weights 01/04/2020 01/03/2020 12/31/2019  Weight (lbs) 245 lb 13 oz 248 lb 0.3 oz 236 lb 4.8 oz  Weight (kg) 111.5 kg 112.5 kg 107.185 kg      Telemetry      SR - Personally Reviewed  ECG    No new  - Personally Reviewed  Physical Exam   GEN: Obese 49 yo In NAD  Neck: No JVD Cardiac: RRR, no murmurs, Respiratory: Clear to auscultation bilaterally. GI:  Soft, nontender, non-distended  MS: No edema; No deformity. Neuro:  Nonfocal  Psych: Normal affect   Labs    High Sensitivity Troponin:   Recent Labs  Lab 01/01/20 2317 01/02/20 0356 01/02/20 1141  TROPONINIHS 54* 396* 790*      Chemistry Recent Labs  Lab 01/02/20 0356 01/03/20 0500 01/04/20 0730  NA 132* 134* 129*  K 3.7 3.1* 3.3*  CL 93* 93* 90*  CO2 25 26 25   GLUCOSE 331* 177* 180*  BUN 78* 76* 66*  CREATININE 15.70* 15.89* 15.62*  CALCIUM 8.7* 8.5* 8.3*  PROT 5.8* 5.4* 5.8*  ALBUMIN 2.7* 2.6* 2.9*  AST 12* 13* 19  ALT 14 13 15   ALKPHOS 48 47 58  BILITOT 0.7 0.6 0.8  GFRNONAA 3* 3* 3*  GFRAA 4* 4* 4*  ANIONGAP 14 15 14      Hematology Recent Labs  Lab 01/02/20 0356 01/02/20 0356 01/02/20 1141 01/03/20 0500 01/04/20 0730  WBC 6.0  --   --  6.5 5.5  RBC 2.20*  --   --  2.18* 2.75*  HGB 6.6*   < > 7.3* 6.8* 8.8*  HCT 19.9*   < >  21.2* 20.2* 24.7*  MCV 90.5  --   --  92.7 89.8  MCH 30.0  --   --  31.2 32.0  MCHC 33.2  --   --  33.7 35.6  RDW 16.8*  --   --  16.5* 16.0*  PLT 202  --   --  192 198   < > = values in this interval not displayed.    BNPNo results for input(s): BNP, PROBNP in the last 168 hours.   DDimer No results for input(s): DDIMER in the last 168 hours.   Radiology    No results found.  Cardiac Studies   Echo April 2021  1. Left ventricular ejection fraction, by estimation, is 60 to 65%. The left ventricle has normal function. The left ventricle has no regional wall motion abnormalities. There is mild left ventricular hypertrophy. Left ventricular diastolic parameters are consistent with Grade I diastolic dysfunction (impaired relaxation). 2. Right ventricular systolic function is mildly reduced. The right ventricular size is normal. Tricuspid regurgitation signal is inadequate for assessing PA pressure. 3. The mitral valve is grossly normal. Trivial mitral valve regurgitation. 4. The aortic valve is tricuspid. Aortic valve  regurgitation is not visualized. 5. The inferior vena cava is dilated in size with <50% respiratory variability, suggesting right atrial pressure of 15 mmHg.  Patient Profile     49 y.o. male with a hx of CAD s/p three-vessel CABG in 2016 in, hyper hypertension, ESRD on peritoneal dialysis, DM2, and history of chest pain who is being seen today for the evaluation of elevated troponin in the setting of GI bleed at the request of Dr. Alfredia Ferguson.  Assessment & Plan    *`  CAD  Pt is s/p CABG in [place] in 2016  ([place]) (stress test in March 2021 as part of renal tx eval)   Presented to ED with bleeding   Developed CP    Had itntermitt  CP at home  Peak troponin 790  ( 54, 396, 790) ( in setting of dialysis)   Pt with profound anemia  Hgb 6.8  Most likely demand ischemia in setting of CAD and GI bleeding    No plans for invasive eval for now   Comfortable.  Agree with continued medical management     3  HTN  BP is labile   WIll follow as outpt   4  Anemia   Hgb 8.8  Stable    5  GI  EGD with bleeding ulcer and CMV      6  Renal  Continues peritoneal dialysis    OK to d/c from cardiac standpoint  Will make sure he has f/u in our [place] office as he doesn't have a cardiologist in [place] says he had cardiac procedures at [facility]  I have had him sign a release of info and will try to obtain  Could not find cardiol note in Epic    For questions or updates, please contact [place] Please consult [email or web address] for contact info under        Signed, Dorris Carnes, MD  01/04/2020, 9:20 AM

## 2020-01-06 LAB — TYPE AND SCREEN
ABO/RH(D): O POS
Antibody Screen: NEGATIVE
Unit division: 0
Unit division: 0
Unit division: 0
Unit division: 0
Unit division: 0
Unit division: 0

## 2020-01-06 LAB — BPAM RBC
Blood Product Expiration Date: 202110252359
Blood Product Expiration Date: 202110252359
Blood Product Expiration Date: 202110252359
Blood Product Expiration Date: 202110262359
Blood Product Expiration Date: 202110262359
Blood Product Expiration Date: 202110282359
ISSUE DATE / TIME: 202109221558
ISSUE DATE / TIME: 202109230909
ISSUE DATE / TIME: 202109231422
ISSUE DATE / TIME: 202109240632
ISSUE DATE / TIME: 202109251108
ISSUE DATE / TIME: 202109251619
Unit Type and Rh: 5100
Unit Type and Rh: 5100
Unit Type and Rh: 5100
Unit Type and Rh: 5100
Unit Type and Rh: 5100
Unit Type and Rh: 5100

## 2020-01-06 LAB — SURGICAL PATHOLOGY

## 2020-01-07 NOTE — Anesthesia Postprocedure Evaluation (Signed)
Anesthesia Post Note  Patient: Sidhant Helderman  Procedure(s) Performed: ESOPHAGOGASTRODUODENOSCOPY (EGD) WITH PROPOFOL (N/A )  Patient location during evaluation: PACU Anesthesia Type: General Level of consciousness: awake and alert Pain management: pain level controlled Vital Signs Assessment: post-procedure vital signs reviewed and stable Respiratory status: spontaneous breathing, nonlabored ventilation, respiratory function stable and patient connected to nasal cannula oxygen Cardiovascular status: blood pressure returned to baseline and stable Postop Assessment: no apparent nausea or vomiting Anesthetic complications: no   No complications documented.   Last Vitals:  Vitals:   01/04/20 0009 01/04/20 0917  BP: (!) 115/47 (!) 156/74  Pulse: 74 79  Resp: 17 18  Temp: 37.1 C 37.1 C  SpO2: 96% 98%    Last Pain:  Vitals:   01/04/20 0917  TempSrc: Oral  PainSc:                  Molli Barrows

## 2020-01-22 ENCOUNTER — Other Ambulatory Visit
Admission: RE | Admit: 2020-01-22 | Discharge: 2020-01-22 | Disposition: A | Discharge status: Home / Self Care | Payer: Medicare Other | Source: Ambulatory Visit | Attending: Internal Medicine | Admitting: Internal Medicine

## 2020-01-22 ENCOUNTER — Other Ambulatory Visit: Payer: Self-pay

## 2020-01-22 DIAGNOSIS — Z20822 Contact with and (suspected) exposure to covid-19: Secondary | ICD-10-CM | POA: Insufficient documentation

## 2020-01-22 DIAGNOSIS — Z01812 Encounter for preprocedural laboratory examination: Secondary | ICD-10-CM | POA: Diagnosis present

## 2020-01-22 LAB — SARS CORONAVIRUS 2 (TAT 6-24 HRS): SARS Coronavirus 2: NEGATIVE

## 2020-01-22 NOTE — Progress Notes (Deleted)
Cardiology Office Note   Date:  01/22/2020   ID:  Donald [name], DOB May 24, 1970, MRN [id number]  PCP:  Patient, [name]  Cardiologist: Dr. Harrington Challenger, MD    No chief complaint on file.     History of Present Illness: Donald [name] is a 48 y.o. male who presents for hospital follow up, seen for Dr.Ross.   Donald [name] has a hx of CAD s/p CABG x3 2016, chronic hypertensive heart disease with LVH, diastolic dysfunction, ESRD on peritoneal dialysis, and DM2 who was recently seen in hospital consultation for the evaluation of elevated hsT.   He presented to [facility] 07/2019 with chest pain at which time BP was noted to be markedly elevated at 227/125. Echo 07/2019 showed LVEF at 60 to 65%, no RWMA, mild LVH, G1 DD, mildly reduced RV systolic function, trivial MR, RAP 15 mmHg. It was recommended that his volume status be optimized. Plan was to obtain records from [place] where he previously had lived and had undergone preliminary testing for renal transplant. He reported undergoing an echocardiogram and stress test 06/2019.   He was then seen in the ED after presenting from dialysis with reported hematemesis and weakness. Upper GI endoscopy had recently showed bleeding ulcer and CMV. He was transfused however continued to have n/v. Cardiology was consulted due to elevated troponin which was felt to be secondary to demand ischemia secondary to intractable n/v with no further workup recommendations at that time. Peak trop was 790. He was anemic with a Hb at 6.8. He was continued on ASA, carvediolol, Lasix 80, statin     Demand ischemia In the setting of known coronary disease, LVH, hypertension, anemia Chest pain atypical in nature in the setting of intractable vomiting No further ischemic work-up at this time -Acceptable risk for EGD Not a good candidate for anticoagulation at this time  Hypertensive heart disease Long history of poorly controlled hypertension, Mild to moderate LVH on prior  echocardiogram April 2021 -We will monitor blood pressure, may need higher dose nifedipine if it continues to run high -Would moderate IV fluids given high risk for diastolic CHF in the setting of LVH, end-stage renal disease  End-stage renal disease on peritoneal dialysis Per nephrology   Hypertensive urgency In the setting of intractable vomiting, If needed Nitropaste can be used or hydralazine  Upper GI bleed On Protonix IV Consider GI cocktail for any acute pain Acceptable risk for EGD  Blood loss anemia Has had transfusion Ideally would like hemoglobin 10 or higher given underlying coronary disease and demand ischemia    Past Medical History:  Diagnosis Date  . Diabetes mellitus without complication ([place])   . Hypertension   . Renal disorder     Past Surgical History:  Procedure Laterality Date  . AV FISTULA PLACEMENT    . CARDIAC SURGERY    . CORONARY ARTERY BYPASS GRAFT  2016  . ESOPHAGOGASTRODUODENOSCOPY (EGD) WITH PROPOFOL N/A 01/02/2020   Procedure: ESOPHAGOGASTRODUODENOSCOPY (EGD) WITH PROPOFOL;  Surgeon: Lesly Rubenstein, MD;  Location: [facility] ENDOSCOPY;  Service: Endoscopy;  Laterality: N/A;  . INSERTION OF DIALYSIS CATHETER       Current Outpatient Medications  Medication Sig Dispense Refill  . aspirin EC 81 MG EC tablet Take 1 tablet (81 mg total) by mouth daily. 30 tablet 3  . Calcium Acetate, Phos Binder, (PHOSLO PO) Take 4 capsules by mouth 3 (three) times daily with meals.    . carvedilol (COREG) 12.5 MG tablet Take 1 tablet (  12.5 mg total) by mouth 2 (two) times daily with a meal. 60 tablet 3  . extraneal, ICODEXTRIN, peritoneal dialysis solution (ICODEXTRIN) 7.5 % As directed by nephro    . furosemide (LASIX) 80 MG tablet Take 1 tablet (80 mg total) by mouth 2 (two) times daily. 60 tablet 3  . insulin aspart (NOVOLOG) 100 UNIT/ML FlexPen Inject 6 Units into the skin 3 (three) times daily with meals. 15 mL 11  . insulin glargine (LANTUS) 100  UNIT/ML injection Inject 0.1 mLs (10 Units total) into the skin daily. 10 mL 11  . Insulin Pen Needle 29G X 10MM MISC 1 each by Does not apply route in the morning, at noon, and at bedtime. 60 each 3  . menthol-cetylpyridinium (CEPACOL) 3 MG lozenge Take 1 lozenge (3 mg total) by mouth as needed for sore throat. 100 tablet 12  . NIFEdipine (ADALAT CC) 60 MG 24 hr tablet Take 60 mg by mouth 2 (two) times daily.    . ondansetron (ZOFRAN ODT) 4 MG disintegrating tablet Take 1 tablet (4 mg total) by mouth every 8 (eight) hours as needed for nausea or vomiting. 20 tablet 0  . pantoprazole (PROTONIX) 40 MG tablet Take 40 mg by mouth 2 (two) times daily.    . paricalcitol (ZEMPLAR) 1 MCG capsule Take 3 capsules (3 mcg total) by mouth daily. 90 capsule 3  . Peritoneal Dialysis Solutions (DIALYSIS SOLUTION 2.5% LOW-MG/LOW-CA) 394 MOSM/L SOLN dianeal solution As per nephro  0  . rosuvastatin (CRESTOR) 10 MG tablet Take 10 mg by mouth at bedtime.    . senna (SENOKOT) 8.6 MG tablet Take 2 tablets (17.2 mg total) by mouth daily. 30 tablet 3  . sucralfate (CARAFATE) 1 GM/10ML suspension Take 10 mLs (1 g total) by mouth 2 (two) times daily. 420 mL 0  . sucroferric oxyhydroxide (VELPHORO) 500 MG chewable tablet Chew 1 tablet (500 mg total) by mouth 3 (three) times daily with meals. 90 tablet 3  . valganciclovir (VALCYTE) 50 MG/ML SOLR Take 4 mLs by mouth 2 (two) times a week. Tuesday and Friday     No current facility-administered medications for this visit.    Allergies:   Lisinopril and Other    Social History:  The patient  reports that he has never smoked. He has never used smokeless tobacco. He reports current alcohol use.   Family History:  The patient's ***family history includes Diabetes Mellitus II in his mother; Heart disease in his mother.    ROS:  Please see the history of present illness.   Otherwise, review of systems are positive for {NONE DEFAULTED:18576::"none"}.   All other systems are  reviewed and negative.    PHYSICAL EXAM: VS:  There were no vitals taken for this visit. , BMI There is no height or weight on file to calculate BMI. GEN: Well nourished, well developed, in no acute distress HEENT: normal Neck: no JVD, carotid bruits, or masses Cardiac: ***RRR; no murmurs, rubs, or gallops,no edema  Respiratory:  clear to auscultation bilaterally, normal work of breathing GI: soft, nontender, nondistended, + BS MS: no deformity or atrophy Skin: warm and dry, no rash Neuro:  Strength and sensation are intact Psych: euthymic mood, full affect   EKG:  EKG {ACTION; IS/IS OIN:86767209} ordered today. The ekg ordered today demonstrates ***   Recent Labs: 01/04/2020: ALT 15; BUN 66; Creatinine, Ser 15.62; Hemoglobin 8.8; Magnesium 2.1; Platelets 198; Potassium 3.3; Sodium 129    Lipid Panel    Component Value  Date/Time   CHOL 134 07/14/2019 2209   TRIG 127 07/14/2019 2209   HDL 37 (L) 07/14/2019 2209   CHOLHDL 3.6 07/14/2019 2209   VLDL 25 07/14/2019 2209   LDLCALC 72 07/14/2019 2209      Wt Readings from Last 3 Encounters:  01/04/20 245 lb 13 oz (111.5 kg)  07/16/19 247 lb 2.2 oz (112.1 kg)      Other studies Reviewed: Additional studies/ records that were reviewed today include: ***. Review of the above records demonstrates: ***   ASSESSMENT AND PLAN:  1.  ***   Current medicines are reviewed at length with the patient today.  The patient {ACTIONS; HAS/DOES NOT HAVE:19233} concerns regarding medicines.  The following changes have been made:  {PLAN; NO CHANGE:13088:s}  Labs/ tests ordered today include: *** No orders of the defined types were placed in this encounter.    Disposition:   FU with *** in {gen number [id number]} {Days to years:10300}  Signed, Kathyrn Drown, NP  01/22/2020 12:31 PM    [facility] [place], [place], [place]  [address] Phone: [phone]; Fax: [phone]

## 2020-01-23 ENCOUNTER — Encounter: Payer: Self-pay | Admitting: Internal Medicine

## 2020-01-26 ENCOUNTER — Other Ambulatory Visit: Payer: Self-pay

## 2020-01-26 ENCOUNTER — Ambulatory Visit: Payer: Medicare Other | Admitting: Anesthesiology

## 2020-01-26 ENCOUNTER — Ambulatory Visit
Admission: RE | Admit: 2020-01-26 | Discharge: 2020-01-26 | Disposition: A | Discharge status: Home / Self Care | Payer: Medicare Other | Attending: Internal Medicine | Admitting: Internal Medicine

## 2020-01-26 ENCOUNTER — Encounter
Admission: RE | Disposition: A | Discharge status: Home / Self Care | Payer: Self-pay | Source: Home / Self Care | Attending: Internal Medicine

## 2020-01-26 DIAGNOSIS — Z79899 Other long term (current) drug therapy: Secondary | ICD-10-CM | POA: Diagnosis not present

## 2020-01-26 DIAGNOSIS — Z951 Presence of aortocoronary bypass graft: Secondary | ICD-10-CM | POA: Insufficient documentation

## 2020-01-26 DIAGNOSIS — E1122 Type 2 diabetes mellitus with diabetic chronic kidney disease: Secondary | ICD-10-CM | POA: Diagnosis not present

## 2020-01-26 DIAGNOSIS — R109 Unspecified abdominal pain: Secondary | ICD-10-CM | POA: Diagnosis not present

## 2020-01-26 DIAGNOSIS — Z794 Long term (current) use of insulin: Secondary | ICD-10-CM | POA: Insufficient documentation

## 2020-01-26 DIAGNOSIS — K219 Gastro-esophageal reflux disease without esophagitis: Secondary | ICD-10-CM | POA: Insufficient documentation

## 2020-01-26 DIAGNOSIS — Z888 Allergy status to other drugs, medicaments and biological substances status: Secondary | ICD-10-CM | POA: Diagnosis not present

## 2020-01-26 DIAGNOSIS — I120 Hypertensive chronic kidney disease with stage 5 chronic kidney disease or end stage renal disease: Secondary | ICD-10-CM | POA: Diagnosis not present

## 2020-01-26 DIAGNOSIS — N186 End stage renal disease: Secondary | ICD-10-CM | POA: Diagnosis not present

## 2020-01-26 DIAGNOSIS — Z7982 Long term (current) use of aspirin: Secondary | ICD-10-CM | POA: Insufficient documentation

## 2020-01-26 DIAGNOSIS — Z992 Dependence on renal dialysis: Secondary | ICD-10-CM | POA: Insufficient documentation

## 2020-01-26 DIAGNOSIS — R112 Nausea with vomiting, unspecified: Secondary | ICD-10-CM | POA: Diagnosis not present

## 2020-01-26 DIAGNOSIS — Z539 Procedure and treatment not carried out, unspecified reason: Secondary | ICD-10-CM | POA: Insufficient documentation

## 2020-01-26 HISTORY — DX: Presence of aortocoronary bypass graft: Z95.1

## 2020-01-26 LAB — GLUCOSE, CAPILLARY: Glucose-Capillary: 156 mg/dL — ABNORMAL HIGH (ref 70–99)

## 2020-01-26 SURGERY — EGD (ESOPHAGOGASTRODUODENOSCOPY)
Anesthesia: General

## 2020-01-26 MED ORDER — SODIUM CHLORIDE 0.9 % IV SOLN: INTRAVENOUS | Status: DC

## 2020-01-26 MED ORDER — SODIUM CHLORIDE 0.9 % IV SOLN
INTRAVENOUS | Status: DC
  Administered 2020-01-26: 10 mL/h via INTRAVENOUS

## 2020-01-26 MED ORDER — PROPOFOL 10 MG/ML IV BOLUS
INTRAVENOUS | Status: AC
  Filled 2020-01-26: qty 80

## 2020-01-26 NOTE — Anesthesia Preprocedure Evaluation (Signed)
Anesthesia Evaluation  Patient identified by MRN, date of birth, ID band Patient awake    Reviewed: Allergy & Precautions, H&P , NPO status , Patient's Chart, lab work & pertinent test results, reviewed documented beta blocker date and time   Airway Mallampati: II   Neck ROM: full    Dental  (+) Poor Dentition   Pulmonary neg pulmonary ROS,    Pulmonary exam normal        Cardiovascular Exercise Tolerance: Good hypertension, On Medications + CABG  Normal cardiovascular exam Rhythm:regular Rate:Normal     Neuro/Psych negative neurological ROS  negative psych ROS   GI/Hepatic negative GI ROS, Neg liver ROS,   Endo/Other  negative endocrine ROSdiabetes, Well Controlled, Type 1, Insulin Dependent  Renal/GU Renal disease  negative genitourinary   Musculoskeletal   Abdominal   Peds  Hematology negative hematology ROS (+)   Anesthesia Other Findings Past Medical History: No date: Diabetes mellitus without complication ([place]) 4742: History of vitrectomy     Comment:  anterior, left No date: Hx of heart bypass surgery No date: Hypertension No date: Renal disorder Past Surgical History: No date: AV FISTULA PLACEMENT No date: CARDIAC SURGERY 2016: CORONARY ARTERY BYPASS GRAFT 01/02/2020: ESOPHAGOGASTRODUODENOSCOPY (EGD) WITH PROPOFOL; N/A     Comment:  Procedure: ESOPHAGOGASTRODUODENOSCOPY (EGD) WITH               PROPOFOL;  Surgeon: Lesly Rubenstein, MD;  Location:               [facility] ENDOSCOPY;  Service: Endoscopy;  Laterality: N/A; No date: INSERTION OF DIALYSIS CATHETER BMI    Body Mass Index: 34.44 kg/m     Reproductive/Obstetrics negative OB ROS                             Anesthesia Physical Anesthesia Plan  ASA: III  Anesthesia Plan: General   Post-op Pain Management:    Induction:   PONV Risk Score and Plan:   Airway Management Planned:   Additional Equipment:    Intra-op Plan:   Post-operative Plan:   Informed Consent: I have reviewed the patients History and Physical, chart, labs and discussed the procedure including the risks, benefits and alternatives for the proposed anesthesia with the patient or authorized representative who has indicated his/her understanding and acceptance.     Dental Advisory Given  Plan Discussed with: CRNA  Anesthesia Plan Comments:         Anesthesia Quick Evaluation

## 2020-01-26 NOTE — H&P (Signed)
Outpatient short stay form Pre-procedure 01/26/2020 10:46 AM Donald [name] K. Donald [name], M.D.  Primary Physician: Donald [name], M.D.  Reason for visit:  Abdominal pain, GERD, nausea, vomiting  History of present illness:  49 y/o male with with history of hypertension diabetes and end-stage renal disease on hemodialysis.  Endoscopy is scheduled for complaints of abdominal pain in the setting of previous secondary bacterial peritonitis from peritoneal dialysis. Though no longer with pain, patient has intermittent vomiting 1-2 times per week.  No dysphagia, hematemesis or melena.  Patient has mild waterbrash and retrosternal burning treated with pantoprazole 40 mg daily.  Patient takes no anticoagulant but has been placed on a baby aspirin 81 mg daily.. Reviewing the chart, the patient was hospitalized in late September and underwent an upper endoscopy by Dr. Raylene [name] revealing esophageal ulcers.  Treatment has been rendered and clinical follow-up is pending.  Current Facility-Administered Medications:  .  0.9 %  sodium chloride infusion, , Intravenous, Continuous, Emmelia Holdsworth K, MD .  0.9 %  sodium chloride infusion, , Intravenous, Continuous, [place], Benay Pike, MD, Last Rate: 10 mL/hr at 01/26/20 1033, 10 mL/hr at 01/26/20 1033  Medications Prior to Admission  Medication Sig Dispense Refill Last Dose  . aspirin EC 81 MG EC tablet Take 1 tablet (81 mg total) by mouth daily. 30 tablet 3 01/25/2020 at Unknown time  . Calcium Acetate, Phos Binder, (PHOSLO PO) Take 4 capsules by mouth 3 (three) times daily with meals.   01/25/2020 at Unknown time  . carvedilol (COREG) 12.5 MG tablet Take 1 tablet (12.5 mg total) by mouth 2 (two) times daily with a meal. 60 tablet 3 01/26/2020 at 1730  . cinacalcet (SENSIPAR) 30 MG tablet Take 30 mg by mouth daily.   01/25/2020 at Unknown time  . extraneal, ICODEXTRIN, peritoneal dialysis solution (ICODEXTRIN) 7.5 % As directed by nephro   01/25/2020 at Unknown  time  . furosemide (LASIX) 80 MG tablet Take 1 tablet (80 mg total) by mouth 2 (two) times daily. 60 tablet 3 01/25/2020 at Unknown time  . gentamicin cream (GARAMYCIN) 0.1 % Apply 1 application topically every morning. Apply to exit site every day   01/25/2020 at Unknown time  . insulin aspart (NOVOLOG) 100 UNIT/ML FlexPen Inject 6 Units into the skin 3 (three) times daily with meals. 15 mL 11 01/25/2020 at Unknown time  . insulin glargine (LANTUS) 100 UNIT/ML injection Inject 0.1 mLs (10 Units total) into the skin daily. 10 mL 11 01/25/2020 at Unknown time  . Insulin Pen Needle 29G X 10MM MISC 1 each by Does not apply route in the morning, at noon, and at bedtime. 60 each 3 01/25/2020 at Unknown time  . menthol-cetylpyridinium (CEPACOL) 3 MG lozenge Take 1 lozenge (3 mg total) by mouth as needed for sore throat. 100 tablet 12 01/25/2020 at Unknown time  . NIFEdipine (ADALAT CC) 60 MG 24 hr tablet Take 60 mg by mouth 2 (two) times daily.   01/26/2020 at 0730  . ondansetron (ZOFRAN ODT) 4 MG disintegrating tablet Take 1 tablet (4 mg total) by mouth every 8 (eight) hours as needed for nausea or vomiting. 20 tablet 0 01/25/2020 at Unknown time  . pantoprazole (PROTONIX) 40 MG tablet Take 40 mg by mouth 2 (two) times daily.   01/25/2020 at Unknown time  . paricalcitol (ZEMPLAR) 1 MCG capsule Take 3 capsules (3 mcg total) by mouth daily. 90 capsule 3 01/25/2020 at Unknown time  . Peritoneal Dialysis Solutions (DIALYSIS SOLUTION 2.5% LOW-MG/LOW-CA) 394  MOSM/L SOLN dianeal solution As per nephro  0 01/25/2020 at Unknown time  . rosuvastatin (CRESTOR) 10 MG tablet Take 10 mg by mouth at bedtime.   01/25/2020 at Unknown time  . senna (SENOKOT) 8.6 MG tablet Take 2 tablets (17.2 mg total) by mouth daily. 30 tablet 3 01/25/2020 at Unknown time  . sucralfate (CARAFATE) 1 GM/10ML suspension Take 10 mLs (1 g total) by mouth 2 (two) times daily. 420 mL 0 01/25/2020 at Unknown time  . sucroferric oxyhydroxide  (VELPHORO) 500 MG chewable tablet Chew 1 tablet (500 mg total) by mouth 3 (three) times daily with meals. 90 tablet 3 01/25/2020 at Unknown time  . valganciclovir (VALCYTE) 50 MG/ML SOLR Take 4 mLs by mouth 2 (two) times a week. Tuesday and Friday   01/25/2020 at Unknown time     Allergies  Allergen Reactions  . Lisinopril Hives  . Other     Other reaction(s): EGGPLANT     Past Medical History:  Diagnosis Date  . Diabetes mellitus without complication ([place])   . History of vitrectomy 2014   anterior, left  . Hx of heart bypass surgery   . Hypertension   . Renal disorder     Review of systems:  Otherwise negative.    Physical Exam  Gen: Alert, oriented. Appears stated age.  HEENT: [place]/AT. PERRLA. Lungs: CTA, no wheezes. CV: RR nl S1, S2. Abd: soft, benign, no masses. BS+ Ext: No edema. Pulses 2+    Planned procedures: Cancel EGD today given recent endoscopic evaluation in the hospital.  I was not aware of this recent intervention and will plan on scheduling an elective colonoscopy for colon cancer screening in the near future.  Patient is in agreement to do so and was discharged today without medication or procedures performed.      Donald [name] K. Donald [name], M.D. Gastroenterology 01/26/2020  10:46 AM

## 2020-01-30 ENCOUNTER — Ambulatory Visit: Payer: Medicare Other | Admitting: Cardiology

## 2020-02-09 ENCOUNTER — Other Ambulatory Visit
Admission: RE | Admit: 2020-02-09 | Discharge: 2020-02-09 | Disposition: A | Discharge status: Home / Self Care | Payer: Medicare Other | Source: Ambulatory Visit | Attending: Internal Medicine | Admitting: Internal Medicine

## 2020-02-09 ENCOUNTER — Other Ambulatory Visit: Payer: Self-pay

## 2020-02-09 DIAGNOSIS — Z01818 Encounter for other preprocedural examination: Secondary | ICD-10-CM | POA: Insufficient documentation

## 2020-02-09 DIAGNOSIS — Z20822 Contact with and (suspected) exposure to covid-19: Secondary | ICD-10-CM | POA: Diagnosis not present

## 2020-02-10 ENCOUNTER — Encounter: Payer: Self-pay | Admitting: Internal Medicine

## 2020-02-10 LAB — SARS CORONAVIRUS 2 (TAT 6-24 HRS): SARS Coronavirus 2: NEGATIVE

## 2020-02-11 ENCOUNTER — Encounter: Payer: Self-pay | Admitting: Internal Medicine

## 2020-02-11 ENCOUNTER — Encounter
Admission: RE | Disposition: A | Discharge status: Home / Self Care | Payer: Self-pay | Source: Home / Self Care | Attending: Internal Medicine

## 2020-02-11 ENCOUNTER — Ambulatory Visit: Payer: Medicare Other | Admitting: Registered Nurse

## 2020-02-11 ENCOUNTER — Ambulatory Visit
Admission: RE | Admit: 2020-02-11 | Discharge: 2020-02-11 | Disposition: A | Discharge status: Home / Self Care | Payer: Medicare Other | Attending: Internal Medicine | Admitting: Internal Medicine

## 2020-02-11 DIAGNOSIS — N186 End stage renal disease: Secondary | ICD-10-CM | POA: Insufficient documentation

## 2020-02-11 DIAGNOSIS — Z888 Allergy status to other drugs, medicaments and biological substances status: Secondary | ICD-10-CM | POA: Diagnosis not present

## 2020-02-11 DIAGNOSIS — Z91018 Allergy to other foods: Secondary | ICD-10-CM | POA: Insufficient documentation

## 2020-02-11 DIAGNOSIS — Z1211 Encounter for screening for malignant neoplasm of colon: Secondary | ICD-10-CM | POA: Insufficient documentation

## 2020-02-11 DIAGNOSIS — Z79899 Other long term (current) drug therapy: Secondary | ICD-10-CM | POA: Insufficient documentation

## 2020-02-11 DIAGNOSIS — Z794 Long term (current) use of insulin: Secondary | ICD-10-CM | POA: Diagnosis not present

## 2020-02-11 DIAGNOSIS — E1022 Type 1 diabetes mellitus with diabetic chronic kidney disease: Secondary | ICD-10-CM | POA: Insufficient documentation

## 2020-02-11 DIAGNOSIS — Z7982 Long term (current) use of aspirin: Secondary | ICD-10-CM | POA: Insufficient documentation

## 2020-02-11 DIAGNOSIS — I120 Hypertensive chronic kidney disease with stage 5 chronic kidney disease or end stage renal disease: Secondary | ICD-10-CM | POA: Diagnosis not present

## 2020-02-11 DIAGNOSIS — Z992 Dependence on renal dialysis: Secondary | ICD-10-CM | POA: Diagnosis not present

## 2020-02-11 HISTORY — PX: COLONOSCOPY WITH PROPOFOL: SHX5780

## 2020-02-11 LAB — GLUCOSE, CAPILLARY: Glucose-Capillary: 113 mg/dL — ABNORMAL HIGH (ref 70–99)

## 2020-02-11 SURGERY — COLONOSCOPY WITH PROPOFOL
Anesthesia: General

## 2020-02-11 MED ORDER — LIDOCAINE HCL (CARDIAC) PF 100 MG/5ML IV SOSY
PREFILLED_SYRINGE | INTRAVENOUS | Status: DC | PRN
  Administered 2020-02-11: 100 mg via INTRAVENOUS

## 2020-02-11 MED ORDER — PROPOFOL 500 MG/50ML IV EMUL
INTRAVENOUS | Status: DC | PRN
  Administered 2020-02-11: 125 ug/kg/min via INTRAVENOUS

## 2020-02-11 MED ORDER — SODIUM CHLORIDE 0.9 % IV SOLN: INTRAVENOUS | Status: DC

## 2020-02-11 MED ORDER — PROPOFOL 500 MG/50ML IV EMUL
INTRAVENOUS | Status: AC
  Filled 2020-02-11: qty 50

## 2020-02-11 MED ORDER — PROPOFOL 10 MG/ML IV BOLUS
INTRAVENOUS | Status: DC | PRN
  Administered 2020-02-11: 70 mg via INTRAVENOUS
  Administered 2020-02-11: 30 mg via INTRAVENOUS

## 2020-02-11 MED ORDER — LIDOCAINE HCL (PF) 2 % IJ SOLN
INTRAMUSCULAR | Status: AC
  Filled 2020-02-11: qty 5

## 2020-02-11 NOTE — Anesthesia Preprocedure Evaluation (Signed)
Anesthesia Evaluation  Patient identified by MRN, date of birth, ID band Patient awake    Reviewed: Allergy & Precautions, H&P , NPO status , Patient's Chart, lab work & pertinent test results, reviewed documented beta blocker date and time   History of Anesthesia Complications Negative for: history of anesthetic complications  Airway Mallampati: II  TM Distance: >3 FB Neck ROM: full    Dental  (+) Poor Dentition, Chipped   Pulmonary neg pulmonary ROS, neg shortness of breath,    Pulmonary exam normal        Cardiovascular Exercise Tolerance: Good hypertension, On Medications (-) angina+ CABG  Normal cardiovascular exam Rhythm:regular Rate:Normal     Neuro/Psych negative neurological ROS  negative psych ROS   GI/Hepatic negative GI ROS, Neg liver ROS, neg GERD  ,  Endo/Other  diabetes, Well Controlled, Type 1, Insulin Dependent  Renal/GU DialysisRenal disease  negative genitourinary   Musculoskeletal   Abdominal   Peds  Hematology negative hematology ROS (+)   Anesthesia Other Findings Past Medical History: No date: Diabetes mellitus without complication ([place]) 9741: History of vitrectomy     Comment:  anterior, left No date: Hx of heart bypass surgery No date: Hypertension No date: Renal disorder Past Surgical History: No date: AV FISTULA PLACEMENT No date: CARDIAC SURGERY 2016: CORONARY ARTERY BYPASS GRAFT 01/02/2020: ESOPHAGOGASTRODUODENOSCOPY (EGD) WITH PROPOFOL; N/A     Comment:  Procedure: ESOPHAGOGASTRODUODENOSCOPY (EGD) WITH               PROPOFOL;  Surgeon: Lesly Rubenstein, MD;  Location:               [facility] ENDOSCOPY;  Service: Endoscopy;  Laterality: N/A; No date: INSERTION OF DIALYSIS CATHETER BMI    Body Mass Index: 34.44 kg/m     Reproductive/Obstetrics negative OB ROS                             Anesthesia Physical  Anesthesia Plan  ASA: IV  Anesthesia  Plan: General   Post-op Pain Management:    Induction: Intravenous  PONV Risk Score and Plan: Propofol infusion and TIVA  Airway Management Planned: Natural Airway and Nasal Cannula  Additional Equipment:   Intra-op Plan:   Post-operative Plan:   Informed Consent: I have reviewed the patients History and Physical, chart, labs and discussed the procedure including the risks, benefits and alternatives for the proposed anesthesia with the patient or authorized representative who has indicated his/her understanding and acceptance.     Dental Advisory Given  Plan Discussed with: CRNA  Anesthesia Plan Comments: (Patient consented for risks of anesthesia including but not limited to:  - adverse reactions to medications - risk of intubation if required - damage to eyes, teeth, lips or other oral mucosa - nerve damage due to positioning  - sore throat or hoarseness - Damage to heart, brain, nerves, lungs, other parts of body or loss of life  Patient voiced understanding.)        Anesthesia Quick Evaluation

## 2020-02-11 NOTE — Op Note (Signed)
[facility] Patient Name: Donald [name] Procedure Date: 02/11/2020 11:09 AM MRN: [id number] Account #: [id number] Date of Birth: March 14, 1971 Admit Type: Outpatient Age: 49 Room: [facility] ENDO ROOM 2 Gender: Male Note Status: Finalized Procedure:             Colonoscopy Indications:           Screening for colorectal malignant neoplasm Providers:             Benay Pike. Paloma Grange MD, MD Medicines:             Propofol per Anesthesia Complications:         No immediate complications. Procedure:             Pre-Anesthesia Assessment:                        - The risks and benefits of the procedure and the                         sedation options and risks were discussed with the                         patient. All questions were answered and informed                         consent was obtained.                        - Patient identification and proposed procedure were                         verified prior to the procedure by the nurse. The                         procedure was verified in the procedure room.                        - ASA Grade Assessment: III - A patient with severe                         systemic disease.                        - After reviewing the risks and benefits, the patient                         was deemed in satisfactory condition to undergo the                         procedure.                        After obtaining informed consent, the colonoscope was                         passed under direct vision. Throughout the procedure,                         the patient's blood pressure, pulse, and oxygen  saturations were monitored continuously. The                         Colonoscope was introduced through the anus and                         advanced to the the cecum, identified by appendiceal                         orifice and ileocecal valve. The colonoscopy was                         performed  without difficulty. The patient tolerated                         the procedure well. The quality of the bowel                         preparation was excellent. The ileocecal valve,                         appendiceal orifice, and rectum were photographed. Findings:      The perianal and digital rectal examinations were normal. Pertinent       negatives include normal sphincter tone and no palpable rectal lesions.      The entire examined colon appeared normal on direct and retroflexion       views. Impression:            - The entire examined colon is normal on direct and                         retroflexion views.                        - No specimens collected. Recommendation:        - Patient has a contact number available for                         emergencies. The signs and symptoms of potential                         delayed complications were discussed with the patient.                         Return to normal activities tomorrow. Written                         discharge instructions were provided to the patient.                        - Resume previous diet.                        - Continue present medications.                        - Repeat colonoscopy in 10 years for screening                         purposes.                        -  Return to GI office PRN.                        - The findings and recommendations were discussed with                         the patient. Procedure Code(s):     --- Professional ---                        Q8250, Colorectal cancer screening; colonoscopy on                         individual not meeting criteria for high risk Diagnosis Code(s):     --- Professional ---                        Z12.11, Encounter for screening for malignant neoplasm                         of colon CPT copyright 2019 [employer]. All rights reserved. The codes documented in this report are preliminary and upon coder review may  be revised to  meet current compliance requirements. Efrain Sella MD, MD 02/11/2020 11:32:34 AM This report has been signed electronically. Number of Addenda: 0 Note Initiated On: 02/11/2020 11:09 AM Scope Withdrawal Time: 0 hours 6 minutes 21 seconds  Total Procedure Duration: 0 hours 10 minutes 49 seconds  Estimated Blood Loss:  Estimated blood loss: none.      [facility]

## 2020-02-11 NOTE — H&P (Signed)
Outpatient short stay form Pre-procedure 02/11/2020 11:06 AM Donald [name] K. Donald [name], M.D.  Primary Physician: Donald [name], M.D.  Reason for visit:  Colon cancer screening  History of present illness:  49 y/o male with history of ESRD on peritoneal dialysis present for colon cancer screening. Patient denies change in bowel habits, rectal bleeding, weight loss or abdominal pain.      Current Facility-Administered Medications:  .  0.9 %  sodium chloride infusion, , Intravenous, Continuous, [place], Donald Pike, MD, Last Rate: 20 mL/hr at 02/11/20 0956, New Bag at 02/11/20 0956  Medications Prior to Admission  Medication Sig Dispense Refill Last Dose  . aspirin EC 81 MG EC tablet Take 1 tablet (81 mg total) by mouth daily. 30 tablet 3 02/10/2020 at Unknown time  . Calcium Acetate, Phos Binder, (PHOSLO PO) Take 4 capsules by mouth 3 (three) times daily with meals.   02/10/2020 at Unknown time  . carvedilol (COREG) 12.5 MG tablet Take 1 tablet (12.5 mg total) by mouth 2 (two) times daily with a meal. 60 tablet 3 02/10/2020 at Unknown time  . cinacalcet (SENSIPAR) 30 MG tablet Take 30 mg by mouth daily.   02/10/2020 at Unknown time  . furosemide (LASIX) 80 MG tablet Take 1 tablet (80 mg total) by mouth 2 (two) times daily. 60 tablet 3 02/10/2020 at Unknown time  . gentamicin cream (GARAMYCIN) 0.1 % Apply 1 application topically every morning. Apply to exit site every day   02/10/2020 at Unknown time  . NIFEdipine (ADALAT CC) 60 MG 24 hr tablet Take 60 mg by mouth 2 (two) times daily.   02/10/2020 at Unknown time  . paricalcitol (ZEMPLAR) 1 MCG capsule Take 3 capsules (3 mcg total) by mouth daily. 90 capsule 3 02/10/2020 at Unknown time  . rosuvastatin (CRESTOR) 10 MG tablet Take 10 mg by mouth at bedtime.   02/10/2020 at Unknown time  . senna (SENOKOT) 8.6 MG tablet Take 2 tablets (17.2 mg total) by mouth daily. 30 tablet 3 02/10/2020 at Unknown time  . sucralfate (CARAFATE) 1 GM/10ML suspension Take 10 mLs (1 g  total) by mouth 2 (two) times daily. 420 mL 0 02/10/2020 at Unknown time  . valganciclovir (VALCYTE) 50 MG/ML SOLR Take 4 mLs by mouth 2 (two) times a week. Tuesday and Friday   02/09/2020 at Unknown time  . extraneal, ICODEXTRIN, peritoneal dialysis solution (ICODEXTRIN) 7.5 % As directed by nephro     . insulin aspart (NOVOLOG) 100 UNIT/ML FlexPen Inject 6 Units into the skin 3 (three) times daily with meals. 15 mL 11 02/09/2020  . insulin glargine (LANTUS) 100 UNIT/ML injection Inject 0.1 mLs (10 Units total) into the skin daily. 10 mL 11 02/09/2020  . Insulin Pen Needle 29G X 10MM MISC 1 each by Does not apply route in the morning, at noon, and at bedtime. 60 each 3   . menthol-cetylpyridinium (CEPACOL) 3 MG lozenge Take 1 lozenge (3 mg total) by mouth as needed for sore throat. (Patient not taking: Reported on 02/11/2020) 100 tablet 12 Not Taking at Unknown time  . ondansetron (ZOFRAN ODT) 4 MG disintegrating tablet Take 1 tablet (4 mg total) by mouth every 8 (eight) hours as needed for nausea or vomiting. 20 tablet 0   . pantoprazole (PROTONIX) 40 MG tablet Take 40 mg by mouth 2 (two) times daily. (Patient not taking: Reported on 02/11/2020)   Not Taking at Unknown time  . Peritoneal Dialysis Solutions (DIALYSIS SOLUTION 2.5% LOW-MG/LOW-CA) 394 MOSM/L SOLN dianeal solution As  per nephro  0   . sucroferric oxyhydroxide (VELPHORO) 500 MG chewable tablet Chew 1 tablet (500 mg total) by mouth 3 (three) times daily with meals. 90 tablet 3 02/09/2020     Allergies  Allergen Reactions  . Lisinopril Hives  . Other     Other reaction(s): EGGPLANT     Past Medical History:  Diagnosis Date  . Diabetes mellitus without complication ([place])   . History of vitrectomy 2014   anterior, left  . Hx of heart bypass surgery   . Hypertension   . Renal disorder    dialysis     Review of systems:  Otherwise negative.    Physical Exam  Gen: Alert, oriented. Appears stated age.  HEENT: [place]/AT.  PERRLA. Lungs: CTA, no wheezes. CV: RR nl S1, S2. Abd: soft, benign, no masses. BS+ Ext: No edema. Pulses 2+    Planned procedures: Proceed with colonoscopy. The patient understands the nature of the planned procedure, indications, risks, alternatives and potential complications including but not limited to bleeding, infection, perforation, damage to internal organs and possible oversedation/side effects from anesthesia. The patient agrees and gives consent to proceed.  Please refer to procedure notes for findings, recommendations and patient disposition/instructions.     Donald [name] K. Donald [name], M.D. Gastroenterology 02/11/2020  11:06 AM

## 2020-02-11 NOTE — Interval H&P Note (Signed)
History and Physical Interval Note:  02/11/2020 11:07 AM  Donald [name]  has presented today for surgery, with the diagnosis of screening.  The various methods of treatment have been discussed with the patient and family. After consideration of risks, benefits and other options for treatment, the patient has consented to  Procedure(s): COLONOSCOPY WITH PROPOFOL (N/A) as a surgical intervention.  The patient's history has been reviewed, patient examined, no change in status, stable for surgery.  I have reviewed the patient's chart and labs.  Questions were answered to the patient's satisfaction.     [place], [place]

## 2020-02-11 NOTE — Transfer of Care (Signed)
Immediate Anesthesia Transfer of Care Note  Patient: [name]  Procedure(s) Performed: COLONOSCOPY WITH PROPOFOL (N/A )  Patient Location: PACU and Endoscopy Unit  Anesthesia Type:General  Level of Consciousness: awake, drowsy and patient cooperative  Airway & Oxygen Therapy: Patient Spontanous Breathing  Post-op Assessment: Report given to RN and Post -op Vital signs reviewed and stable  Post vital signs: Reviewed and stable  Last Vitals:  Vitals Value Taken Time  BP 174/84 02/11/20 1135  Temp 36.7 C 02/11/20 1133  Pulse 78 02/11/20 1135  Resp 15 02/11/20 1135  SpO2 100 % 02/11/20 1135    Last Pain:  Vitals:   02/11/20 1133  TempSrc: Temporal  PainSc: Asleep         Complications: No complications documented.

## 2020-02-12 ENCOUNTER — Encounter: Payer: Self-pay | Admitting: Internal Medicine

## 2020-02-12 NOTE — Anesthesia Postprocedure Evaluation (Signed)
Anesthesia Post Note  Patient: Donald [name]  Procedure(s) Performed: COLONOSCOPY WITH PROPOFOL (N/A )  Patient location during evaluation: Endoscopy Anesthesia Type: General Level of consciousness: awake and alert Pain management: pain level controlled Vital Signs Assessment: post-procedure vital signs reviewed and stable Respiratory status: spontaneous breathing, nonlabored ventilation, respiratory function stable and patient connected to nasal cannula oxygen Cardiovascular status: blood pressure returned to baseline and stable Postop Assessment: no apparent nausea or vomiting Anesthetic complications: no   No complications documented.   Last Vitals:  Vitals:   02/11/20 1143 02/11/20 1153  BP: (!) 180/92 (!) 207/96  Pulse:    Resp:    Temp:    SpO2:      Last Pain:  Vitals:   02/12/20 0758  TempSrc:   PainSc: 0-No pain                 [name]

## 2020-03-26 ENCOUNTER — Ambulatory Visit: Payer: Medicare Other | Admitting: Family Medicine

## 2020-04-07 ENCOUNTER — Emergency Department (HOSPITAL_COMMUNITY)
Admission: EM | Admit: 2020-04-07 | Discharge: 2020-04-08 | Disposition: A | Discharge status: Home / Self Care | Payer: Medicare Other | Attending: Emergency Medicine | Admitting: Emergency Medicine

## 2020-04-07 DIAGNOSIS — R1010 Upper abdominal pain, unspecified: Secondary | ICD-10-CM | POA: Insufficient documentation

## 2020-04-07 DIAGNOSIS — Z5321 Procedure and treatment not carried out due to patient leaving prior to being seen by health care provider: Secondary | ICD-10-CM | POA: Insufficient documentation

## 2020-04-07 DIAGNOSIS — R1110 Vomiting, unspecified: Secondary | ICD-10-CM | POA: Insufficient documentation

## 2020-04-08 ENCOUNTER — Other Ambulatory Visit: Payer: Self-pay

## 2020-04-08 ENCOUNTER — Encounter (HOSPITAL_COMMUNITY): Payer: Self-pay | Admitting: Emergency Medicine

## 2020-04-08 LAB — CBC
HCT: 29.3 % — ABNORMAL LOW (ref 39.0–52.0)
Hemoglobin: 10.4 g/dL — ABNORMAL LOW (ref 13.0–17.0)
MCH: 31.5 pg (ref 26.0–34.0)
MCHC: 35.5 g/dL (ref 30.0–36.0)
MCV: 88.8 fL (ref 80.0–100.0)
Platelets: 270 10*3/uL (ref 150–400)
RBC: 3.3 MIL/uL — ABNORMAL LOW (ref 4.22–5.81)
RDW: 16.6 % — ABNORMAL HIGH (ref 11.5–15.5)
WBC: 4.9 10*3/uL (ref 4.0–10.5)
nRBC: 0 % (ref 0.0–0.2)

## 2020-04-08 LAB — COMPREHENSIVE METABOLIC PANEL
ALT: 32 U/L (ref 0–44)
AST: 16 U/L (ref 15–41)
Albumin: 3.2 g/dL — ABNORMAL LOW (ref 3.5–5.0)
Alkaline Phosphatase: 93 U/L (ref 38–126)
Anion gap: 20 — ABNORMAL HIGH (ref 5–15)
BUN: 72 mg/dL — ABNORMAL HIGH (ref 6–20)
CO2: 23 mmol/L (ref 22–32)
Calcium: 8.9 mg/dL (ref 8.9–10.3)
Chloride: 87 mmol/L — ABNORMAL LOW (ref 98–111)
Creatinine, Ser: 18.12 mg/dL — ABNORMAL HIGH (ref 0.61–1.24)
GFR, Estimated: 3 mL/min — ABNORMAL LOW (ref >60)
Glucose, Bld: 119 mg/dL — ABNORMAL HIGH (ref 70–99)
Potassium: 4.3 mmol/L (ref 3.5–5.1)
Sodium: 130 mmol/L — ABNORMAL LOW (ref 135–145)
Total Bilirubin: 0.6 mg/dL (ref 0.3–1.2)
Total Protein: 6.9 g/dL (ref 6.5–8.1)

## 2020-04-08 LAB — LIPASE, BLOOD: Lipase: 37 U/L (ref 11–51)

## 2020-04-08 NOTE — ED Triage Notes (Signed)
Patient reports mid/upper abdominal pain with emesis onset Sunday , no diarrhea or fever , he did not take his antihypertensive and no peritoneal dialysis today . Hypertensive , no fever or chills .

## 2020-04-08 NOTE — ED Notes (Signed)
Pt advised NF he is leaving.

## 2020-04-10 DIAGNOSIS — N186 End stage renal disease: Secondary | ICD-10-CM | POA: Diagnosis not present

## 2020-04-10 DIAGNOSIS — Z992 Dependence on renal dialysis: Secondary | ICD-10-CM | POA: Diagnosis not present

## 2020-04-11 DIAGNOSIS — N186 End stage renal disease: Secondary | ICD-10-CM | POA: Diagnosis not present

## 2020-04-11 DIAGNOSIS — Z992 Dependence on renal dialysis: Secondary | ICD-10-CM | POA: Diagnosis not present

## 2020-04-12 DIAGNOSIS — N186 End stage renal disease: Secondary | ICD-10-CM | POA: Diagnosis not present

## 2020-04-12 DIAGNOSIS — Z992 Dependence on renal dialysis: Secondary | ICD-10-CM | POA: Diagnosis not present

## 2020-04-13 DIAGNOSIS — N186 End stage renal disease: Secondary | ICD-10-CM | POA: Diagnosis not present

## 2020-04-13 DIAGNOSIS — Z992 Dependence on renal dialysis: Secondary | ICD-10-CM | POA: Diagnosis not present

## 2020-04-14 DIAGNOSIS — Z992 Dependence on renal dialysis: Secondary | ICD-10-CM | POA: Diagnosis not present

## 2020-04-14 DIAGNOSIS — N186 End stage renal disease: Secondary | ICD-10-CM | POA: Diagnosis not present

## 2020-04-15 DIAGNOSIS — Z992 Dependence on renal dialysis: Secondary | ICD-10-CM | POA: Diagnosis not present

## 2020-04-15 DIAGNOSIS — R188 Other ascites: Secondary | ICD-10-CM | POA: Diagnosis not present

## 2020-04-15 DIAGNOSIS — Z7682 Awaiting organ transplant status: Secondary | ICD-10-CM | POA: Diagnosis not present

## 2020-04-15 DIAGNOSIS — N186 End stage renal disease: Secondary | ICD-10-CM | POA: Diagnosis not present

## 2020-04-15 DIAGNOSIS — R918 Other nonspecific abnormal finding of lung field: Secondary | ICD-10-CM | POA: Diagnosis not present

## 2020-04-16 DIAGNOSIS — Z992 Dependence on renal dialysis: Secondary | ICD-10-CM | POA: Diagnosis not present

## 2020-04-16 DIAGNOSIS — N186 End stage renal disease: Secondary | ICD-10-CM | POA: Diagnosis not present

## 2020-04-17 DIAGNOSIS — N186 End stage renal disease: Secondary | ICD-10-CM | POA: Diagnosis not present

## 2020-04-17 DIAGNOSIS — Z992 Dependence on renal dialysis: Secondary | ICD-10-CM | POA: Diagnosis not present

## 2020-04-18 DIAGNOSIS — N186 End stage renal disease: Secondary | ICD-10-CM | POA: Diagnosis not present

## 2020-04-18 DIAGNOSIS — Z992 Dependence on renal dialysis: Secondary | ICD-10-CM | POA: Diagnosis not present

## 2020-04-19 DIAGNOSIS — N186 End stage renal disease: Secondary | ICD-10-CM | POA: Diagnosis not present

## 2020-04-19 DIAGNOSIS — Z992 Dependence on renal dialysis: Secondary | ICD-10-CM | POA: Diagnosis not present

## 2020-04-19 DIAGNOSIS — E119 Type 2 diabetes mellitus without complications: Secondary | ICD-10-CM | POA: Diagnosis not present

## 2020-04-19 DIAGNOSIS — D509 Iron deficiency anemia, unspecified: Secondary | ICD-10-CM | POA: Diagnosis not present

## 2020-04-20 DIAGNOSIS — N186 End stage renal disease: Secondary | ICD-10-CM | POA: Diagnosis not present

## 2020-04-20 DIAGNOSIS — Z992 Dependence on renal dialysis: Secondary | ICD-10-CM | POA: Diagnosis not present

## 2020-04-21 DIAGNOSIS — N186 End stage renal disease: Secondary | ICD-10-CM | POA: Diagnosis not present

## 2020-04-21 DIAGNOSIS — Z992 Dependence on renal dialysis: Secondary | ICD-10-CM | POA: Diagnosis not present

## 2020-04-22 ENCOUNTER — Encounter (INDEPENDENT_AMBULATORY_CARE_PROVIDER_SITE_OTHER): Payer: Medicare Other

## 2020-04-22 ENCOUNTER — Encounter (INDEPENDENT_AMBULATORY_CARE_PROVIDER_SITE_OTHER): Payer: Medicare Other | Admitting: Vascular Surgery

## 2020-04-22 DIAGNOSIS — Z992 Dependence on renal dialysis: Secondary | ICD-10-CM | POA: Diagnosis not present

## 2020-04-22 DIAGNOSIS — N186 End stage renal disease: Secondary | ICD-10-CM | POA: Diagnosis not present

## 2020-04-23 DIAGNOSIS — Z992 Dependence on renal dialysis: Secondary | ICD-10-CM | POA: Diagnosis not present

## 2020-04-23 DIAGNOSIS — N186 End stage renal disease: Secondary | ICD-10-CM | POA: Diagnosis not present

## 2020-04-24 DIAGNOSIS — Z992 Dependence on renal dialysis: Secondary | ICD-10-CM | POA: Diagnosis not present

## 2020-04-24 DIAGNOSIS — N186 End stage renal disease: Secondary | ICD-10-CM | POA: Diagnosis not present

## 2020-04-25 DIAGNOSIS — N186 End stage renal disease: Secondary | ICD-10-CM | POA: Diagnosis not present

## 2020-04-25 DIAGNOSIS — Z992 Dependence on renal dialysis: Secondary | ICD-10-CM | POA: Diagnosis not present

## 2020-04-26 DIAGNOSIS — N186 End stage renal disease: Secondary | ICD-10-CM | POA: Diagnosis not present

## 2020-04-26 DIAGNOSIS — Z992 Dependence on renal dialysis: Secondary | ICD-10-CM | POA: Diagnosis not present

## 2020-04-27 DIAGNOSIS — Z992 Dependence on renal dialysis: Secondary | ICD-10-CM | POA: Diagnosis not present

## 2020-04-27 DIAGNOSIS — N186 End stage renal disease: Secondary | ICD-10-CM | POA: Diagnosis not present

## 2020-04-28 DIAGNOSIS — Z992 Dependence on renal dialysis: Secondary | ICD-10-CM | POA: Diagnosis not present

## 2020-04-28 DIAGNOSIS — N186 End stage renal disease: Secondary | ICD-10-CM | POA: Diagnosis not present

## 2020-04-29 DIAGNOSIS — N186 End stage renal disease: Secondary | ICD-10-CM | POA: Diagnosis not present

## 2020-04-29 DIAGNOSIS — Z992 Dependence on renal dialysis: Secondary | ICD-10-CM | POA: Diagnosis not present

## 2020-04-30 DIAGNOSIS — N186 End stage renal disease: Secondary | ICD-10-CM | POA: Diagnosis not present

## 2020-04-30 DIAGNOSIS — Z992 Dependence on renal dialysis: Secondary | ICD-10-CM | POA: Diagnosis not present

## 2020-05-01 DIAGNOSIS — N186 End stage renal disease: Secondary | ICD-10-CM | POA: Diagnosis not present

## 2020-05-01 DIAGNOSIS — Z992 Dependence on renal dialysis: Secondary | ICD-10-CM | POA: Diagnosis not present

## 2020-05-02 DIAGNOSIS — N186 End stage renal disease: Secondary | ICD-10-CM | POA: Diagnosis not present

## 2020-05-02 DIAGNOSIS — Z992 Dependence on renal dialysis: Secondary | ICD-10-CM | POA: Diagnosis not present

## 2020-05-03 DIAGNOSIS — Z992 Dependence on renal dialysis: Secondary | ICD-10-CM | POA: Diagnosis not present

## 2020-05-03 DIAGNOSIS — N186 End stage renal disease: Secondary | ICD-10-CM | POA: Diagnosis not present

## 2020-05-04 DIAGNOSIS — N186 End stage renal disease: Secondary | ICD-10-CM | POA: Diagnosis not present

## 2020-05-04 DIAGNOSIS — Z992 Dependence on renal dialysis: Secondary | ICD-10-CM | POA: Diagnosis not present

## 2020-05-05 DIAGNOSIS — N186 End stage renal disease: Secondary | ICD-10-CM | POA: Diagnosis not present

## 2020-05-05 DIAGNOSIS — Z992 Dependence on renal dialysis: Secondary | ICD-10-CM | POA: Diagnosis not present

## 2020-05-06 DIAGNOSIS — Z992 Dependence on renal dialysis: Secondary | ICD-10-CM | POA: Diagnosis not present

## 2020-05-06 DIAGNOSIS — N186 End stage renal disease: Secondary | ICD-10-CM | POA: Diagnosis not present

## 2020-05-07 DIAGNOSIS — Z992 Dependence on renal dialysis: Secondary | ICD-10-CM | POA: Diagnosis not present

## 2020-05-07 DIAGNOSIS — N186 End stage renal disease: Secondary | ICD-10-CM | POA: Diagnosis not present

## 2020-05-08 DIAGNOSIS — N186 End stage renal disease: Secondary | ICD-10-CM | POA: Diagnosis not present

## 2020-05-08 DIAGNOSIS — Z992 Dependence on renal dialysis: Secondary | ICD-10-CM | POA: Diagnosis not present

## 2020-05-09 DIAGNOSIS — Z992 Dependence on renal dialysis: Secondary | ICD-10-CM | POA: Diagnosis not present

## 2020-05-09 DIAGNOSIS — N186 End stage renal disease: Secondary | ICD-10-CM | POA: Diagnosis not present

## 2020-05-10 DIAGNOSIS — N186 End stage renal disease: Secondary | ICD-10-CM | POA: Diagnosis not present

## 2020-05-10 DIAGNOSIS — Z992 Dependence on renal dialysis: Secondary | ICD-10-CM | POA: Diagnosis not present

## 2020-05-11 DIAGNOSIS — N186 End stage renal disease: Secondary | ICD-10-CM | POA: Diagnosis not present

## 2020-05-11 DIAGNOSIS — Z992 Dependence on renal dialysis: Secondary | ICD-10-CM | POA: Diagnosis not present

## 2020-05-12 DIAGNOSIS — Z992 Dependence on renal dialysis: Secondary | ICD-10-CM | POA: Diagnosis not present

## 2020-05-12 DIAGNOSIS — N186 End stage renal disease: Secondary | ICD-10-CM | POA: Diagnosis not present

## 2020-05-13 ENCOUNTER — Encounter (INDEPENDENT_AMBULATORY_CARE_PROVIDER_SITE_OTHER): Payer: Medicare Other | Admitting: Vascular Surgery

## 2020-05-13 ENCOUNTER — Encounter (INDEPENDENT_AMBULATORY_CARE_PROVIDER_SITE_OTHER): Payer: Medicare Other

## 2020-05-13 DIAGNOSIS — N186 End stage renal disease: Secondary | ICD-10-CM | POA: Diagnosis not present

## 2020-05-13 DIAGNOSIS — Z992 Dependence on renal dialysis: Secondary | ICD-10-CM | POA: Diagnosis not present

## 2020-05-14 DIAGNOSIS — N186 End stage renal disease: Secondary | ICD-10-CM | POA: Diagnosis not present

## 2020-05-14 DIAGNOSIS — Z992 Dependence on renal dialysis: Secondary | ICD-10-CM | POA: Diagnosis not present

## 2020-05-15 DIAGNOSIS — Z992 Dependence on renal dialysis: Secondary | ICD-10-CM | POA: Diagnosis not present

## 2020-05-15 DIAGNOSIS — N186 End stage renal disease: Secondary | ICD-10-CM | POA: Diagnosis not present

## 2020-05-16 DIAGNOSIS — Z992 Dependence on renal dialysis: Secondary | ICD-10-CM | POA: Diagnosis not present

## 2020-05-16 DIAGNOSIS — N186 End stage renal disease: Secondary | ICD-10-CM | POA: Diagnosis not present

## 2020-05-17 DIAGNOSIS — N186 End stage renal disease: Secondary | ICD-10-CM | POA: Diagnosis not present

## 2020-05-17 DIAGNOSIS — Z992 Dependence on renal dialysis: Secondary | ICD-10-CM | POA: Diagnosis not present

## 2020-05-18 DIAGNOSIS — Z992 Dependence on renal dialysis: Secondary | ICD-10-CM | POA: Diagnosis not present

## 2020-05-18 DIAGNOSIS — N186 End stage renal disease: Secondary | ICD-10-CM | POA: Diagnosis not present

## 2020-05-19 DIAGNOSIS — N186 End stage renal disease: Secondary | ICD-10-CM | POA: Diagnosis not present

## 2020-05-19 DIAGNOSIS — D509 Iron deficiency anemia, unspecified: Secondary | ICD-10-CM | POA: Diagnosis not present

## 2020-05-19 DIAGNOSIS — Z01818 Encounter for other preprocedural examination: Secondary | ICD-10-CM | POA: Diagnosis not present

## 2020-05-19 DIAGNOSIS — Z7682 Awaiting organ transplant status: Secondary | ICD-10-CM | POA: Diagnosis not present

## 2020-05-19 DIAGNOSIS — Z992 Dependence on renal dialysis: Secondary | ICD-10-CM | POA: Diagnosis not present

## 2020-05-20 ENCOUNTER — Ambulatory Visit: Payer: Medicare Other | Attending: Internal Medicine | Admitting: Internal Medicine

## 2020-05-20 ENCOUNTER — Other Ambulatory Visit: Payer: Self-pay

## 2020-05-20 ENCOUNTER — Encounter: Payer: Self-pay | Admitting: Internal Medicine

## 2020-05-20 VITALS — BP 142/70 | HR 81 | Temp 98.6°F | Resp 16 | Ht 70.0 in | Wt 240.0 lb

## 2020-05-20 DIAGNOSIS — I25718 Atherosclerosis of autologous vein coronary artery bypass graft(s) with other forms of angina pectoris: Secondary | ICD-10-CM | POA: Insufficient documentation

## 2020-05-20 DIAGNOSIS — N186 End stage renal disease: Secondary | ICD-10-CM

## 2020-05-20 DIAGNOSIS — Z7689 Persons encountering health services in other specified circumstances: Secondary | ICD-10-CM | POA: Diagnosis not present

## 2020-05-20 DIAGNOSIS — Z992 Dependence on renal dialysis: Secondary | ICD-10-CM

## 2020-05-20 DIAGNOSIS — E1142 Type 2 diabetes mellitus with diabetic polyneuropathy: Secondary | ICD-10-CM

## 2020-05-20 DIAGNOSIS — F5101 Primary insomnia: Secondary | ICD-10-CM | POA: Insufficient documentation

## 2020-05-20 DIAGNOSIS — I10 Essential (primary) hypertension: Secondary | ICD-10-CM

## 2020-05-20 DIAGNOSIS — E1169 Type 2 diabetes mellitus with other specified complication: Secondary | ICD-10-CM | POA: Diagnosis not present

## 2020-05-20 DIAGNOSIS — E669 Obesity, unspecified: Secondary | ICD-10-CM

## 2020-05-20 DIAGNOSIS — E785 Hyperlipidemia, unspecified: Secondary | ICD-10-CM | POA: Diagnosis not present

## 2020-05-20 DIAGNOSIS — Z89412 Acquired absence of left great toe: Secondary | ICD-10-CM | POA: Diagnosis not present

## 2020-05-20 LAB — POCT GLYCOSYLATED HEMOGLOBIN (HGB A1C): HbA1c, POC (controlled diabetic range): 6.2 % (ref 0.0–7.0)

## 2020-05-20 LAB — GLUCOSE, POCT (MANUAL RESULT ENTRY): POC Glucose: 230 mg/dl — AB (ref 70–99)

## 2020-05-20 MED ORDER — INSULIN ASPART 100 UNIT/ML FLEXPEN: 6.0000 [IU] | PEN_INJECTOR | Freq: Three times a day (TID) | SUBCUTANEOUS | 11 refills | Status: DC

## 2020-05-20 MED ORDER — LANTUS SOLOSTAR 100 UNIT/ML ~~LOC~~ SOPN: 20.0000 [IU] | PEN_INJECTOR | Freq: Every day | SUBCUTANEOUS | 11 refills | Status: DC

## 2020-05-20 MED ORDER — FREESTYLE LIBRE SENSOR SYSTEM MISC: 12 refills | Status: DC

## 2020-05-20 NOTE — Progress Notes (Signed)
[id number]

## 2020-05-20 NOTE — Patient Instructions (Signed)

## 2020-05-20 NOTE — Progress Notes (Signed)
Patient ID: Donald [name], male    DOB: 12/11/1970  MRN: [id number]  CC: New Patient (Initial Visit)   Subjective: Donald [name] is a 50 y.o. male who presents for new pt visit. At 1 point patient did call his wife and have her listen in on the phone. 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  Previous PCP was in [place] at [facility] (Dr. Florene [name]).  Relocated here 7 mths.  ESRD/HTN/CAD: Previously was on hemodialysis but now does PD at home every night. Sees Dr. Candiss [name] his nephrologist once a mth.  Next appt is next Wednesday; in anticipation of that visit he had blood test done yesterday. On transplant list for [facility], and Donald [name]. -checks BP daily.  Gives range 107-135/55-80 Limits salt in foods -SOB and chest pain intermittently.  "Some days I can walk all day without problems then other days I can only walk several feet because of shortness of breath." Requesting referral to see a cardiologist -no LE edema, orthopnea, PND Compliant with meds but did not take them as yet for the morning.  DIABETES TYPE 2 Last A1C:   Results for orders placed or performed in visit on 05/20/20  POCT glucose (manual entry)  Result Value Ref Range   POC Glucose 230 (A) 70 - 99 mg/dl  POCT glycosylated hemoglobin (Hb A1C)  Result Value Ref Range   Hemoglobin A1C     HbA1c POC (<> result, manual entry)     HbA1c, POC (prediabetic range)     HbA1c, POC (controlled diabetic range) 6.2 0.0 - 7.0 %   Med Adherence:  '[x]'$  Yes  6 units Novolog TID, and Lantus 20 units Medication side effects:  '[]'$  Yes    '[x]'$  No Home Monitoring?  '[x]'$  Yes   Once Q 2 days Home glucose results range 125-178 Diet Adherence: eats more than he should. Eats about once a day.  Drinks mainly water Exercise:  '[x]'$  Yes   "I walk. Not as much as I should but I walk."   Hypoglycemic episodes?: '[]'$  Yes    '[x]'$  No Numbness of the feet? '[x]'$  Yes hands and feet Retinopathy hx? '[x]'$  Yes    '[]'$  No Last eye exam:  Last February. Requests referral to a retinal specialist. Comments:   Insomnia: Patient complains of sleep maintenance for the past 7 to 8 months. He sets up his peritoneal dialysis around 9 PM and gets in bed. He thinks he falls asleep usually between 9 PM and 12 AM. He wakes up again around 2:58 AM and not able to fall back asleep. He does sleep with the television on. He does not drink any caffeinated beverages in the evenings. Does not drink alcoholic beverages in the evenings.  HM: had flu shot 02/2020 at nephrologist, had 2 shot of Moderna, had Pneumovax  Past medical, social, family history and surgical history reviewed and updated. Patient Active Problem List   Diagnosis Date Noted  . Elevated troponin   . GI bleed 01/01/2020  . Hematemesis 12/31/2019  . Type II diabetes mellitus with renal manifestations ([place]) 12/31/2019  . Hypertension   . ESRD on peritoneal dialysis ([place])   . HLD (hyperlipidemia)   . Hypertensive urgency 07/14/2019  . Chest pain      Current Outpatient Medications on  File Prior to Visit  Medication Sig Dispense Refill  . aspirin EC 81 MG EC tablet Take 1 tablet (81 mg total) by mouth daily. 30 tablet 3  . Calcium Acetate, Phos Binder, (PHOSLO PO) Take 4 capsules by mouth 3 (three) times daily with meals.    . carvedilol (COREG) 12.5 MG tablet Take 1 tablet (12.5 mg total) by mouth 2 (two) times daily with a meal. 60 tablet 3  . furosemide (LASIX) 80 MG tablet Take 1 tablet (80 mg total) by mouth 2 (two) times daily. 60 tablet 3  . Insulin Pen Needle 29G X 10MM MISC 1 each by Does not apply route in the morning, at noon, and at bedtime. 60 each 3  . lactulose (CHRONULAC) 10 GM/15ML solution SMARTSIG:Milliliter(s) By Mouth    . NIFEdipine (ADALAT CC) 60 MG 24 hr tablet  Take 60 mg by mouth 2 (two) times daily.    . ondansetron (ZOFRAN ODT) 4 MG disintegrating tablet Take 1 tablet (4 mg total) by mouth every 8 (eight) hours as needed for nausea or vomiting. 20 tablet 0  . rosuvastatin (CRESTOR) 10 MG tablet Take 10 mg by mouth at bedtime.    . cinacalcet (SENSIPAR) 30 MG tablet Take 30 mg by mouth daily. (Patient not taking: Reported on 05/20/2020)    . extraneal, ICODEXTRIN, peritoneal dialysis solution (ICODEXTRIN) 7.5 % As directed by nephro (Patient not taking: Reported on 05/20/2020)    . gentamicin cream (GARAMYCIN) 0.1 % Apply 1 application topically every morning. Apply to exit site every day (Patient not taking: Reported on 05/20/2020)    . paricalcitol (ZEMPLAR) 1 MCG capsule Take 3 capsules (3 mcg total) by mouth daily. (Patient not taking: Reported on 05/20/2020) 90 capsule 3  . Peritoneal Dialysis Solutions (DIALYSIS SOLUTION 2.5% LOW-MG/LOW-CA) 394 MOSM/L SOLN dianeal solution As per nephro (Patient not taking: Reported on 05/20/2020)  0  . senna (SENOKOT) 8.6 MG tablet Take 2 tablets (17.2 mg total) by mouth daily. (Patient not taking: Reported on 05/20/2020) 30 tablet 3   No current facility-administered medications on file prior to visit.    Allergies  Allergen Reactions  . Lisinopril Hives  . Other     Other reaction(s): EGGPLANT    Social History   Socioeconomic History  . Marital status: Married    Spouse name: Not on file  . Number of children: 6  . Years of education: Not on file  . Highest education level: Some college, no degree  Occupational History  . Not on file  Tobacco Use  . Smoking status: Never Smoker  . Smokeless tobacco: Never Used  Vaping Use  . Vaping Use: Never used  Substance and Sexual Activity  . Alcohol use: Yes    Comment: occasionally  . Drug use: Yes    Types: Marijuana    Comment: occasionally  . Sexual activity: Not on file  Other Topics Concern  . Not on file  Social History Narrative  . Not on  file   Social Determinants of Health   Financial Resource Strain: Not on file  Food Insecurity: Not on file  Transportation Needs: Not on file  Physical Activity: Not on file  Stress: Not on file  Social Connections: Not on file  Intimate Partner Violence: Not on file    Family History  Problem Relation Age of Onset  . Diabetes Mellitus II Mother   . Heart disease Mother   . Diabetes Mother   . Hypertension Mother   .  Kidney failure Mother   . Diabetes Maternal Grandmother   . Hyperlipidemia Maternal Grandmother   . Dementia Maternal Grandmother     Past Surgical History:  Procedure Laterality Date  . AV FISTULA PLACEMENT    . CARDIAC SURGERY    . COLONOSCOPY WITH PROPOFOL N/A 02/11/2020   Procedure: COLONOSCOPY WITH PROPOFOL;  Surgeon: Toledo, Benay Pike, MD;  Location: [facility] ENDOSCOPY;  Service: Gastroenterology;  Laterality: N/A;  . CORONARY ARTERY BYPASS GRAFT  2016  . ESOPHAGOGASTRODUODENOSCOPY (EGD) WITH PROPOFOL N/A 01/02/2020   Procedure: ESOPHAGOGASTRODUODENOSCOPY (EGD) WITH PROPOFOL;  Surgeon: Lesly Rubenstein, MD;  Location: [facility] ENDOSCOPY;  Service: Endoscopy;  Laterality: N/A;  . INSERTION OF DIALYSIS CATHETER    . left great toe amputation    . VITRECTOMY Left 2014    ROS: Review of Systems Negative except as stated above  PHYSICAL EXAM: BP (!) 142/70   Pulse 81   Temp 98.6 F (37 C)   Resp 16   Ht '5\' 10"'$  (1.778 m)   Wt 240 lb (108.9 kg)   SpO2 97%   BMI 34.44 kg/m   Wt Readings from Last 3 Encounters:  05/20/20 240 lb (108.9 kg)  04/08/20 253 lb 8.5 oz (115 kg)  02/11/20 235 lb (106.6 kg)    Physical Exam  General appearance - alert, well appearing, middle-age African-American male and in no distress Mental status - normal mood, behavior, speech, dress, motor activity, and thought processes Eyes - pupils equal and reactive, extraocular eye movements intact Neck - supple, no significant adenopathy Chest - clear to auscultation, no  wheezes, rales or rhonchi, symmetric air entry Heart - normal rate, regular rhythm with occasional ectopy, normal S1, S2. Soft systolic ejection murmur left lower sternal border. Extremities -no lower extremity edema. Diabetic Foot Exam - Simple   Simple Foot Form Visual Inspection No deformities, no ulcerations, no other skin breakdown bilaterally: Yes Sensation Testing Intact to touch and monofilament testing bilaterally: Yes Pulse Check Posterior Tibialis and Dorsalis pulse intact bilaterally: Yes Comments Patient has an amputated left big toe. Wound is healed.     CMP Latest Ref Rng & Units 04/08/2020 01/04/2020 01/03/2020  Glucose 70 - 99 mg/dL 119(H) 180(H) 177(H)  BUN 6 - 20 mg/dL 72(H) 66(H) 76(H)  Creatinine 0.61 - 1.24 mg/dL 18.12(H) 15.62(H) 15.89(H)  Sodium 135 - 145 mmol/L 130(L) 129(L) 134(L)  Potassium 3.5 - 5.1 mmol/L 4.3 3.3(L) 3.1(L)  Chloride 98 - 111 mmol/L 87(L) 90(L) 93(L)  CO2 22 - 32 mmol/L '23 25 26  '$ Calcium 8.9 - 10.3 mg/dL 8.9 8.3(L) 8.5(L)  Total Protein 6.5 - 8.1 g/dL 6.9 5.8(L) 5.4(L)  Total Bilirubin 0.3 - 1.2 mg/dL 0.6 0.8 0.6  Alkaline Phos 38 - 126 U/L 93 58 47  AST 15 - 41 U/L 16 19 13(L)  ALT 0 - 44 U/L 32 15 13   Lipid Panel     Component Value Date/Time   CHOL 134 07/14/2019 2209   TRIG 127 07/14/2019 2209   HDL 37 (L) 07/14/2019 2209   CHOLHDL 3.6 07/14/2019 2209   VLDL 25 07/14/2019 2209   LDLCALC 72 07/14/2019 2209    CBC    Component Value Date/Time   WBC 4.9 04/08/2020 0020   RBC 3.30 (L) 04/08/2020 0020   HGB 10.4 (L) 04/08/2020 0020   HCT 29.3 (L) 04/08/2020 0020   PLT 270 04/08/2020 0020   MCV 88.8 04/08/2020 0020   MCH 31.5 04/08/2020 0020   MCHC 35.5 04/08/2020 0020  RDW 16.6 (H) 04/08/2020 0020   LYMPHSABS 1.4 01/04/2020 0730   MONOABS 0.6 01/04/2020 0730   EOSABS 0.5 01/04/2020 0730   BASOSABS 0.0 01/04/2020 0730    ASSESSMENT AND PLAN: 1. Establishing care with new doctor, encounter for 2. Type 2 diabetes  mellitus with peripheral neuropathy (HCC) A1c is at goal. Continue current dose of Lantus and NovoLog. Discussed and encourage healthy eating habits. - Ambulatory referral to Ophthalmology - insulin aspart (NOVOLOG) 100 UNIT/ML FlexPen; Inject 6 Units into the skin 3 (three) times daily with meals.  Dispense: 15 mL; Refill: 11 - Continuous Blood Gluc Sensor (FREESTYLE LIBRE SENSOR SYSTEM) MISC; Change sensor Q 2 wks  Dispense: 2 each; Refill: 12 - insulin glargine (LANTUS SOLOSTAR) 100 UNIT/ML Solostar Pen; Inject 20 Units into the skin daily.  Dispense: 15 mL; Refill: 11 - POCT glucose (manual entry) - POCT glycosylated hemoglobin (Hb A1C) - Microalbumin / creatinine urine ratio  3. Essential hypertension Not at goal but patient has not taken his medicines as yet for the day. Home blood pressure readings are good. He will continue carvedilol, nifedipine, furosemide  4. ESRD on peritoneal dialysis [facility] Followed by nephrology  5. 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 He is also on aspirin. - Ambulatory referral to Cardiology  6. Primary insomnia Good sleep hygiene discussed and encouraged. Encourage patient to turn off all lights and sounds once he gets in bed including his TV. If unable to fall asleep after 40 minutes he should get up and do something until he feels sleepy then try getting back in bed. Recommend trying over-the-counter melatonin but request that he checks with his nephrologist first to make sure it is okay given his kidney function.  7. Hyperlipidemia associated with type 2 diabetes mellitus ([place]) Continue rosuvastatin.  8. History of amputation of left great toe ([place])  9. Obesity Healthy eating habits discussed and encouraged. Patient was given the opportunity to ask questions.  Patient verbalized understanding of the plan and was able to repeat key elements of the plan.    Orders Placed This Encounter  Procedures  . Microalbumin / creatinine urine ratio  . Ambulatory referral to Cardiology  . Ambulatory referral to Ophthalmology  . POCT glucose (manual entry)  . POCT glycosylated hemoglobin (Hb A1C)     Requested Prescriptions   Signed Prescriptions Disp Refills  . insulin aspart (NOVOLOG) 100 UNIT/ML FlexPen 15 mL 11    Sig: Inject 6 Units into the skin 3 (three) times daily with meals.  . Continuous Blood Gluc Sensor (FREESTYLE LIBRE SENSOR SYSTEM) MISC 2 each 12    Sig: Change sensor Q 2 wks  . insulin glargine (LANTUS SOLOSTAR) 100 UNIT/ML Solostar Pen 15 mL 11    Sig: Inject 20 Units into the skin daily.    Return in about 3 months (around 08/17/2020) for Give appt with Lurena Joiner in 1 mth for Medicare Wellness visit.[name], MD, FACP

## 2020-05-21 DIAGNOSIS — Z992 Dependence on renal dialysis: Secondary | ICD-10-CM | POA: Diagnosis not present

## 2020-05-21 DIAGNOSIS — N186 End stage renal disease: Secondary | ICD-10-CM | POA: Diagnosis not present

## 2020-05-22 DIAGNOSIS — Z992 Dependence on renal dialysis: Secondary | ICD-10-CM | POA: Diagnosis not present

## 2020-05-22 DIAGNOSIS — N186 End stage renal disease: Secondary | ICD-10-CM | POA: Diagnosis not present

## 2020-05-23 DIAGNOSIS — N186 End stage renal disease: Secondary | ICD-10-CM | POA: Diagnosis not present

## 2020-05-23 DIAGNOSIS — Z992 Dependence on renal dialysis: Secondary | ICD-10-CM | POA: Diagnosis not present

## 2020-05-24 DIAGNOSIS — N186 End stage renal disease: Secondary | ICD-10-CM | POA: Diagnosis not present

## 2020-05-24 DIAGNOSIS — Z992 Dependence on renal dialysis: Secondary | ICD-10-CM | POA: Diagnosis not present

## 2020-05-25 DIAGNOSIS — Z992 Dependence on renal dialysis: Secondary | ICD-10-CM | POA: Diagnosis not present

## 2020-05-25 DIAGNOSIS — N186 End stage renal disease: Secondary | ICD-10-CM | POA: Diagnosis not present

## 2020-05-26 ENCOUNTER — Other Ambulatory Visit: Payer: Self-pay

## 2020-05-26 ENCOUNTER — Ambulatory Visit (INDEPENDENT_AMBULATORY_CARE_PROVIDER_SITE_OTHER): Payer: Medicare Other | Admitting: Ophthalmology

## 2020-05-26 DIAGNOSIS — Z794 Long term (current) use of insulin: Secondary | ICD-10-CM | POA: Diagnosis not present

## 2020-05-26 DIAGNOSIS — H2511 Age-related nuclear cataract, right eye: Secondary | ICD-10-CM | POA: Diagnosis not present

## 2020-05-26 DIAGNOSIS — N186 End stage renal disease: Secondary | ICD-10-CM | POA: Diagnosis not present

## 2020-05-26 DIAGNOSIS — E113553 Type 2 diabetes mellitus with stable proliferative diabetic retinopathy, bilateral: Secondary | ICD-10-CM | POA: Diagnosis not present

## 2020-05-26 DIAGNOSIS — Z961 Presence of intraocular lens: Secondary | ICD-10-CM | POA: Diagnosis not present

## 2020-05-26 DIAGNOSIS — H35371 Puckering of macula, right eye: Secondary | ICD-10-CM

## 2020-05-26 DIAGNOSIS — Z992 Dependence on renal dialysis: Secondary | ICD-10-CM | POA: Diagnosis not present

## 2020-05-26 NOTE — Progress Notes (Signed)
05/26/2020     CHIEF COMPLAINT Patient presents for Retina Evaluation (NP-Hx of retinal detachment 10/2012 OS, vitrectomy OS 2014////Pt report blurry vision OS>OD. Pt has black floaters that come and go OD. Pt denies any pain or pressure OU.///Last A1C: 6.7 04/2020            Last BS: around 190 a few days ago. )   HISTORY OF PRESENT ILLNESS: Donald [name] is a 50 y.o. male who presents to the clinic today for:   HPI    Retina Evaluation    In left eye.  This started 8 years ago.  Duration of 8 years.  Associated Symptoms Floaters.  Context:  distance vision and near vision. Additional comments: NP-Hx of retinal detachment 10/2012 OS, vitrectomy OS 2014    Pt report blurry vision OS>OD. Pt has black floaters that come and go OD. Pt denies any pain or pressure OU.   Last A1C: 6.7 04/2020            Last BS: around 190 a few days ago.        Last edited by Nichola Sizer D on 05/26/2020  2:25 PM. (History)      Referring physician: Ladell Pier, MD [place],  [place] [id number]  HISTORICAL INFORMATION:   Selected notes from the MEDICAL RECORD NUMBER    Lab Results  Component Value Date   HGBA1C 6.2 05/20/2020     CURRENT MEDICATIONS: No current outpatient medications on file. (Ophthalmic Drugs)   No current facility-administered medications for this visit. (Ophthalmic Drugs)   Current Outpatient Medications (Other)  Medication Sig  . aspirin EC 81 MG EC tablet Take 1 tablet (81 mg total) by mouth daily.  . Calcium Acetate, Phos Binder, (PHOSLO PO) Take 4 capsules by mouth 3 (three) times daily with meals.  . carvedilol (COREG) 12.5 MG tablet Take 1 tablet (12.5 mg total) by mouth 2 (two) times daily with a meal.  . cinacalcet (SENSIPAR) 30 MG tablet Take 30 mg by mouth daily. (Patient not taking: Reported on 05/20/2020)  . Continuous Blood Gluc Sensor (FREESTYLE LIBRE SENSOR SYSTEM) MISC Change sensor Q 2 wks  . extraneal, ICODEXTRIN, peritoneal  dialysis solution (ICODEXTRIN) 7.5 % As directed by nephro (Patient not taking: Reported on 05/20/2020)  . furosemide (LASIX) 80 MG tablet Take 1 tablet (80 mg total) by mouth 2 (two) times daily.  [name] gentamicin cream (GARAMYCIN) 0.1 % Apply 1 application topically every morning. Apply to exit site every day (Patient not taking: Reported on 05/20/2020)  . insulin aspart (NOVOLOG) 100 UNIT/ML FlexPen Inject 6 Units into the skin 3 (three) times daily with meals.  . insulin glargine (LANTUS SOLOSTAR) 100 UNIT/ML Solostar Pen Inject 20 Units into the skin daily.  . Insulin Pen Needle 29G X 10MM MISC 1 each by Does not apply route in the morning, at noon, and at bedtime.  [name] lactulose (CHRONULAC) 10 GM/15ML solution SMARTSIG:Milliliter(s) By Mouth  . NIFEdipine (ADALAT CC) 60 MG 24 hr tablet Take 60 mg by mouth 2 (two) times daily.  . ondansetron (ZOFRAN ODT) 4 MG disintegrating tablet Take 1 tablet (4 mg total) by mouth every 8 (eight) hours as needed for nausea or vomiting.  . paricalcitol (ZEMPLAR) 1 MCG capsule Take 3 capsules (3 mcg total) by mouth daily. (Patient not taking: Reported on 05/20/2020)  . Peritoneal Dialysis Solutions (DIALYSIS SOLUTION 2.5% LOW-MG/LOW-CA) 394 MOSM/L SOLN dianeal solution As per nephro (Patient not taking: Reported  on 05/20/2020)  . rosuvastatin (CRESTOR) 10 MG tablet Take 10 mg by mouth at bedtime.  . senna (SENOKOT) 8.6 MG tablet Take 2 tablets (17.2 mg total) by mouth daily. (Patient not taking: Reported on 05/20/2020)   No current facility-administered medications for this visit. (Other)      REVIEW OF SYSTEMS:    ALLERGIES Allergies  Allergen Reactions  . Lisinopril Hives  . Other     Other reaction(s): EGGPLANT    PAST MEDICAL HISTORY Past Medical History:  Diagnosis Date  . Diabetes mellitus without complication ([place])   . History of vitrectomy 2014   anterior, left  . Hx of heart bypass surgery   . Hypertension   . Renal disorder    dialysis     Past Surgical History:  Procedure Laterality Date  . AV FISTULA PLACEMENT    . CARDIAC SURGERY    . COLONOSCOPY WITH PROPOFOL N/A 02/11/2020   Procedure: COLONOSCOPY WITH PROPOFOL;  Surgeon: Toledo, Benay Pike, MD;  Location: [facility] ENDOSCOPY;  Service: Gastroenterology;  Laterality: N/A;  . CORONARY ARTERY BYPASS GRAFT  2016  . ESOPHAGOGASTRODUODENOSCOPY (EGD) WITH PROPOFOL N/A 01/02/2020   Procedure: ESOPHAGOGASTRODUODENOSCOPY (EGD) WITH PROPOFOL;  Surgeon: Lesly Rubenstein, MD;  Location: [facility] ENDOSCOPY;  Service: Endoscopy;  Laterality: N/A;  . INSERTION OF DIALYSIS CATHETER    . left great toe amputation    . VITRECTOMY Left 2014    FAMILY HISTORY Family History  Problem Relation Age of Onset  . Diabetes Mellitus II Mother   . Heart disease Mother   . Diabetes Mother   . Hypertension Mother   . Kidney failure Mother   . Diabetes Maternal Grandmother   . Hyperlipidemia Maternal Grandmother   . Dementia Maternal Grandmother     SOCIAL HISTORY Social History   Tobacco Use  . Smoking status: Never Smoker  . Smokeless tobacco: Never Used  Vaping Use  . Vaping Use: Never used  Substance Use Topics  . Alcohol use: Yes    Comment: occasionally  . Drug use: Yes    Types: Marijuana    Comment: occasionally         OPHTHALMIC EXAM: Base Eye Exam    Visual Acuity (ETDRS)      Right Left   Dist [place] 20/40 +2 20/200   Dist ph [place] 20/30 +2 NI       Tonometry (Tonopen, 2:31 PM)      Right Left   Pressure 11 13       Pupils      Pupils Dark Light Shape React APD   Right PERRL 3 3 Round Brisk None   Left PERRL 3 3 Round Brisk None       Visual Fields (Counting fingers)      Left Right     Full   Restrictions Partial outer inferior nasal deficiency        Extraocular Movement      Right Left    Full Full       Neuro/Psych    Oriented x3: Yes       Dilation    Both eyes: 1.0% Mydriacyl, 2.5% Phenylephrine @ 2:32 PM        Slit Lamp and Fundus Exam     External Exam      Right Left   External Normal Normal       Slit Lamp Exam      Right Left   Lids/Lashes Normal Normal   Conjunctiva/Sclera White and  quiet White and quiet   Cornea Clear Clear   Anterior Chamber Deep and quiet Deep and quiet   Iris Round and reactive Round and reactive   Lens 2+ Nuclear sclerosis Centered posterior chamber intraocular lens, Open posterior capsule   Anterior Vitreous Normal Normal       Fundus Exam      Right Left   Posterior Vitreous Partial, low-lying Clear, Avitric   Disc Vit pap traction, old FPD Pallor 1+   C/D Ratio 0.3 0.65   Macula Epiretinal membrane mild, Microaneurysms, Exudates Epiretinal membrane moderate, Focal laser scars   Vessels PDR-quiet PDR-quiet   Periphery Good PRP, traction ST, traction IT with a large sheet of fibrotic posterior hyaloid partially elevated with focal tractional attachments, not a retinal detachment, not atrophic retina, no active NVE Good PRP 360          IMAGING AND PROCEDURES  Imaging and Procedures for 05/26/20  OCT, Retina - OU - Both Eyes       Right Eye Quality was good. Scan locations included subfoveal. Central Foveal Thickness: 267. Progression has no prior data. Findings include epiretinal membrane.   Left Eye Quality was good. Scan locations included subfoveal. Central Foveal Thickness: 161. Progression has no prior data. Findings include epiretinal membrane.   Notes OS with diffuse foveal atrophy, no active maculopathy  Epiretinal membrane right eye mild, no active intraretinal fluid nor cystoid macular edema nor CSME       Color Fundus Photography Optos - OU - Both Eyes       Right Eye Progression has no prior data. Disc findings include neovascularization. Macula : epiretinal membrane.   Left Eye Progression has no prior data.   Notes OD with epiretinal membrane mild to moderate topographic distortion or changes, old inactive neovascularization from the optic nerve  now fibrotic proliferation of the disc stable with traction on the residual low-lying posterior partial vitreous detachment  OS, foveal atrophy accounts for acuity most likely as a consequence of prior advanced diabetic retinopathy with loss of the outer ellipsoid zone Layers.  This accounts for acuity.  Mild epiretinal membrane temporally, not visually pathologic.  Bilateral proliferative diabetic retinopathy, quiescent                  ASSESSMENT/PLAN:  Age-related nuclear cataract of right eye Mild and not visually symptomatic at this time  Pseudophakia, left eye Observe  Controlled type 2 diabetes mellitus with stable proliferative retinopathy of both eyes, with long-term current use of insulin (HCC) The nature of regressed proliferative diabetic retinopathy was discussed with the patient. The patient was advised to maintain good glucose, blood pressure, monitor kidney function and serum lipid control as advised by personal physician. Rare risk for reactivation of progression exist with untreated severe anemia, untreated renal failure, untreated heart failure, and smoking. Complete avoidance of smoking was recommended. The chance of recurrent proliferative diabetic retinopathy was discussed as well as the chance of vitreous hemorrhage for which further treatments may be necessary.   Explained to the patient that the quiescent  proliferative diabetic retinopathy disease is unlikely to ever worsen.  Worsening factors would include however severe anemia, hypertension out-of-control or impending renal failure.  Right epiretinal membrane The nature of macular pucker (epiretinal membrane ERM) was discussed with the patient as well as threshold criteria for vitrectomy surgery. I explained that in rare cases another surgery is needed to actually remove a second wrinkle should it regrow.  Most often, the epiretinal membrane and  underlying wrinkled internal limiting membrane are removed with  the first surgery, to accomplish the goals.   If the operative eye is Phakic (natural lens still present), cataract surgery is often recommended prior to Vitrectomy. This will enable the retina surgeon to have the best view during surgery and the patient to obtain optimal results in the future. Treatment options were discussed. Mild, will observe.      ICD-10-CM   1. Right epiretinal membrane  H35.371 OCT, Retina - OU - Both Eyes    Color Fundus Photography Optos - OU - Both Eyes  2. Controlled type 2 diabetes mellitus with stable proliferative retinopathy of both eyes, with long-term current use of insulin (HCC)  E11.3553 OCT, Retina - OU - Both Eyes   Z79.4 Color Fundus Photography Optos - OU - Both Eyes  3. Age-related nuclear cataract of right eye  H25.11   4. Pseudophakia, left eye  Z96.1     1.  I reviewed the findings with the patient and the fact that we need to establish that his baseline which is report been stable now for some years, is indeed stable as he changed his care from [place] area to [place].  [place] patient turn follow-up visit here once for dilate examination in 6 months and thereafter likely on a 17-monthschedule 2.  We will [facility] or any eye care program of his choice  3.  Ophthalmic Meds Ordered this visit:  No orders of the defined types were placed in this encounter.      Return in about 6 months (around 11/23/2020) for DILATE OU, COLOR FP, OCT.  There are no Patient Instructions on file for this visit.   Explained the diagnoses, plan, and follow up with the patient and they expressed understanding.  Patient expressed understanding of the importance of proper follow up care.   GClent DemarkRankin M.D. Diseases & Surgery of the Retina and Vitreous Retina & Diabetic E[place]02/16/22     Abbreviations: M myopia (nearsighted); A astigmatism; H hyperopia (farsighted); P presbyopia; Mrx  spectacle prescription;  CTL contact lenses; OD right eye; OS left eye; OU both eyes  XT exotropia; ET esotropia; PEK punctate epithelial keratitis; PEE punctate epithelial erosions; DES dry eye syndrome; MGD meibomian gland dysfunction; ATs artificial tears; PFAT's preservative free artificial tears; N[place]nuclear sclerotic cataract; PSC posterior subcapsular cataract; ERM epi-retinal membrane; PVD posterior vitreous detachment; RD retinal detachment; DM diabetes mellitus; DR diabetic retinopathy; NPDR non-proliferative diabetic retinopathy; PDR proliferative diabetic retinopathy; CSME clinically significant macular edema; DME diabetic macular edema; dbh dot blot hemorrhages; CWS cotton wool spot; POAG primary open angle glaucoma; C/D cup-to-disc ratio; HVF humphrey visual field; GVF goldmann visual field; OCT optical coherence tomography; IOP intraocular pressure; BRVO Branch retinal vein occlusion; CRVO central retinal vein occlusion; CRAO central retinal artery occlusion; BRAO branch retinal artery occlusion; RT retinal tear; SB scleral buckle; PPV pars plana vitrectomy; VH Vitreous hemorrhage; PRP panretinal laser photocoagulation; IVK intravitreal kenalog; VMT vitreomacular traction; MH Macular hole;  NVD neovascularization of the disc; NVE neovascularization elsewhere; AREDS age related eye disease study; ARMD age related macular degeneration; POAG primary open angle glaucoma; EBMD epithelial/anterior basement membrane dystrophy; ACIOL anterior chamber intraocular lens; IOL intraocular lens; PCIOL posterior chamber intraocular lens; Phaco/IOL phacoemulsification with intraocular lens placement; PBurginphotorefractive keratectomy; LASIK laser assisted in situ keratomileusis; HTN hypertension; DM diabetes mellitus; COPD chronic obstructive pulmonary disease

## 2020-05-26 NOTE — Assessment & Plan Note (Signed)

## 2020-05-26 NOTE — Assessment & Plan Note (Signed)
The nature of macular pucker (epiretinal membrane ERM) was discussed with the patient as well as threshold criteria for vitrectomy surgery. I explained that in rare cases another surgery is needed to actually remove a second wrinkle should it regrow.  Most often, the epiretinal membrane and underlying wrinkled internal limiting membrane are removed with the first surgery, to accomplish the goals.   If the operative eye is Phakic (natural lens still present), cataract surgery is often recommended prior to Vitrectomy. This will enable the retina surgeon to have the best view during surgery and the patient to obtain optimal results in the future. Treatment options were discussed. Mild, will observe.

## 2020-05-26 NOTE — Assessment & Plan Note (Signed)
Observe

## 2020-05-26 NOTE — Assessment & Plan Note (Signed)
Mild and not visually symptomatic at this time

## 2020-05-27 DIAGNOSIS — Z992 Dependence on renal dialysis: Secondary | ICD-10-CM | POA: Diagnosis not present

## 2020-05-27 DIAGNOSIS — N186 End stage renal disease: Secondary | ICD-10-CM | POA: Diagnosis not present

## 2020-05-28 ENCOUNTER — Other Ambulatory Visit (INDEPENDENT_AMBULATORY_CARE_PROVIDER_SITE_OTHER): Payer: Self-pay | Admitting: Vascular Surgery

## 2020-05-28 DIAGNOSIS — M79603 Pain in arm, unspecified: Secondary | ICD-10-CM

## 2020-05-28 DIAGNOSIS — N186 End stage renal disease: Secondary | ICD-10-CM | POA: Diagnosis not present

## 2020-05-28 DIAGNOSIS — Z992 Dependence on renal dialysis: Secondary | ICD-10-CM | POA: Diagnosis not present

## 2020-05-29 DIAGNOSIS — N186 End stage renal disease: Secondary | ICD-10-CM | POA: Diagnosis not present

## 2020-05-29 DIAGNOSIS — Z992 Dependence on renal dialysis: Secondary | ICD-10-CM | POA: Diagnosis not present

## 2020-05-29 NOTE — Progress Notes (Signed)
Let patient know that he has a large amount of protein in the urine due to diabetic nephropathy which he already knows as he is on dialysis.Donald [name]

## 2020-05-30 DIAGNOSIS — Z992 Dependence on renal dialysis: Secondary | ICD-10-CM | POA: Diagnosis not present

## 2020-05-30 DIAGNOSIS — N186 End stage renal disease: Secondary | ICD-10-CM | POA: Diagnosis not present

## 2020-05-31 ENCOUNTER — Telehealth: Payer: Self-pay | Admitting: Internal Medicine

## 2020-05-31 ENCOUNTER — Ambulatory Visit (INDEPENDENT_AMBULATORY_CARE_PROVIDER_SITE_OTHER): Payer: Medicare Other | Admitting: Vascular Surgery

## 2020-05-31 ENCOUNTER — Encounter (INDEPENDENT_AMBULATORY_CARE_PROVIDER_SITE_OTHER): Payer: Self-pay | Admitting: Vascular Surgery

## 2020-05-31 ENCOUNTER — Inpatient Hospital Stay (HOSPITAL_COMMUNITY)
Admission: EM | Admit: 2020-05-31 | Discharge: 2020-06-03 | DRG: 280 | Disposition: A | Discharge status: Home / Self Care | Payer: Medicare Other | Attending: Internal Medicine | Admitting: Internal Medicine

## 2020-05-31 ENCOUNTER — Emergency Department (HOSPITAL_COMMUNITY): Payer: Medicare Other

## 2020-05-31 ENCOUNTER — Ambulatory Visit (INDEPENDENT_AMBULATORY_CARE_PROVIDER_SITE_OTHER): Payer: Medicare Other

## 2020-05-31 ENCOUNTER — Other Ambulatory Visit: Payer: Self-pay

## 2020-05-31 VITALS — BP 111/69 | HR 88 | Resp 16 | Ht 70.0 in | Wt 238.0 lb

## 2020-05-31 DIAGNOSIS — E871 Hypo-osmolality and hyponatremia: Secondary | ICD-10-CM | POA: Diagnosis not present

## 2020-05-31 DIAGNOSIS — D631 Anemia in chronic kidney disease: Secondary | ICD-10-CM | POA: Diagnosis not present

## 2020-05-31 DIAGNOSIS — Z951 Presence of aortocoronary bypass graft: Secondary | ICD-10-CM | POA: Diagnosis not present

## 2020-05-31 DIAGNOSIS — Z888 Allergy status to other drugs, medicaments and biological substances status: Secondary | ICD-10-CM | POA: Diagnosis not present

## 2020-05-31 DIAGNOSIS — I249 Acute ischemic heart disease, unspecified: Secondary | ICD-10-CM | POA: Diagnosis present

## 2020-05-31 DIAGNOSIS — I25718 Atherosclerosis of autologous vein coronary artery bypass graft(s) with other forms of angina pectoris: Secondary | ICD-10-CM

## 2020-05-31 DIAGNOSIS — E1142 Type 2 diabetes mellitus with diabetic polyneuropathy: Secondary | ICD-10-CM | POA: Diagnosis present

## 2020-05-31 DIAGNOSIS — N186 End stage renal disease: Secondary | ICD-10-CM

## 2020-05-31 DIAGNOSIS — Z7982 Long term (current) use of aspirin: Secondary | ICD-10-CM | POA: Diagnosis not present

## 2020-05-31 DIAGNOSIS — I120 Hypertensive chronic kidney disease with stage 5 chronic kidney disease or end stage renal disease: Secondary | ICD-10-CM | POA: Diagnosis not present

## 2020-05-31 DIAGNOSIS — Z79899 Other long term (current) drug therapy: Secondary | ICD-10-CM | POA: Diagnosis not present

## 2020-05-31 DIAGNOSIS — Z8249 Family history of ischemic heart disease and other diseases of the circulatory system: Secondary | ICD-10-CM

## 2020-05-31 DIAGNOSIS — E669 Obesity, unspecified: Secondary | ICD-10-CM | POA: Diagnosis present

## 2020-05-31 DIAGNOSIS — I132 Hypertensive heart and chronic kidney disease with heart failure and with stage 5 chronic kidney disease, or end stage renal disease: Secondary | ICD-10-CM | POA: Diagnosis not present

## 2020-05-31 DIAGNOSIS — I10 Essential (primary) hypertension: Secondary | ICD-10-CM | POA: Diagnosis present

## 2020-05-31 DIAGNOSIS — R079 Chest pain, unspecified: Secondary | ICD-10-CM | POA: Diagnosis present

## 2020-05-31 DIAGNOSIS — M79603 Pain in arm, unspecified: Secondary | ICD-10-CM

## 2020-05-31 DIAGNOSIS — Z89422 Acquired absence of other left toe(s): Secondary | ICD-10-CM | POA: Diagnosis not present

## 2020-05-31 DIAGNOSIS — E113593 Type 2 diabetes mellitus with proliferative diabetic retinopathy without macular edema, bilateral: Secondary | ICD-10-CM | POA: Diagnosis not present

## 2020-05-31 DIAGNOSIS — I214 Non-ST elevation (NSTEMI) myocardial infarction: Principal | ICD-10-CM | POA: Diagnosis present

## 2020-05-31 DIAGNOSIS — E1122 Type 2 diabetes mellitus with diabetic chronic kidney disease: Secondary | ICD-10-CM | POA: Diagnosis present

## 2020-05-31 DIAGNOSIS — Z841 Family history of disorders of kidney and ureter: Secondary | ICD-10-CM | POA: Diagnosis not present

## 2020-05-31 DIAGNOSIS — N2581 Secondary hyperparathyroidism of renal origin: Secondary | ICD-10-CM | POA: Diagnosis not present

## 2020-05-31 DIAGNOSIS — I2510 Atherosclerotic heart disease of native coronary artery without angina pectoris: Secondary | ICD-10-CM | POA: Diagnosis not present

## 2020-05-31 DIAGNOSIS — Z20822 Contact with and (suspected) exposure to covid-19: Secondary | ICD-10-CM | POA: Diagnosis not present

## 2020-05-31 DIAGNOSIS — Z7689 Persons encountering health services in other specified circumstances: Secondary | ICD-10-CM

## 2020-05-31 DIAGNOSIS — Z833 Family history of diabetes mellitus: Secondary | ICD-10-CM | POA: Diagnosis not present

## 2020-05-31 DIAGNOSIS — T829XXA Unspecified complication of cardiac and vascular prosthetic device, implant and graft, initial encounter: Secondary | ICD-10-CM | POA: Diagnosis not present

## 2020-05-31 DIAGNOSIS — R072 Precordial pain: Secondary | ICD-10-CM

## 2020-05-31 DIAGNOSIS — Z794 Long term (current) use of insulin: Secondary | ICD-10-CM | POA: Diagnosis not present

## 2020-05-31 DIAGNOSIS — Z992 Dependence on renal dialysis: Secondary | ICD-10-CM

## 2020-05-31 DIAGNOSIS — I5043 Acute on chronic combined systolic (congestive) and diastolic (congestive) heart failure: Secondary | ICD-10-CM | POA: Diagnosis present

## 2020-05-31 DIAGNOSIS — E785 Hyperlipidemia, unspecified: Secondary | ICD-10-CM | POA: Diagnosis present

## 2020-05-31 DIAGNOSIS — D649 Anemia, unspecified: Secondary | ICD-10-CM | POA: Diagnosis present

## 2020-05-31 DIAGNOSIS — Z6834 Body mass index (BMI) 34.0-34.9, adult: Secondary | ICD-10-CM

## 2020-05-31 DIAGNOSIS — R7989 Other specified abnormal findings of blood chemistry: Secondary | ICD-10-CM | POA: Diagnosis not present

## 2020-05-31 DIAGNOSIS — R9431 Abnormal electrocardiogram [ECG] [EKG]: Secondary | ICD-10-CM

## 2020-05-31 DIAGNOSIS — M79602 Pain in left arm: Secondary | ICD-10-CM

## 2020-05-31 LAB — BASIC METABOLIC PANEL
Anion gap: 14 (ref 5–15)
BUN: 77 mg/dL — ABNORMAL HIGH (ref 6–20)
CO2: 24 mmol/L (ref 22–32)
Calcium: 8.2 mg/dL — ABNORMAL LOW (ref 8.9–10.3)
Chloride: 93 mmol/L — ABNORMAL LOW (ref 98–111)
Creatinine, Ser: 17.38 mg/dL — ABNORMAL HIGH (ref 0.61–1.24)
GFR, Estimated: 3 mL/min — ABNORMAL LOW (ref >60)
Glucose, Bld: 140 mg/dL — ABNORMAL HIGH (ref 70–99)
Potassium: 3.5 mmol/L (ref 3.5–5.1)
Sodium: 131 mmol/L — ABNORMAL LOW (ref 135–145)

## 2020-05-31 LAB — TROPONIN I (HIGH SENSITIVITY)
Troponin I (High Sensitivity): 65 ng/L — ABNORMAL HIGH (ref <18)
Troponin I (High Sensitivity): 219 ng/L (ref <18)

## 2020-05-31 LAB — MICROALBUMIN / CREATININE URINE RATIO
Creatinine, Urine: 101.8 mg/dL
Microalb/Creat Ratio: 30192 mg/g creat — ABNORMAL HIGH (ref 0–29)
Microalbumin, Urine: 30735 ug/mL

## 2020-05-31 LAB — CBC
HCT: 30.4 % — ABNORMAL LOW (ref 39.0–52.0)
Hemoglobin: 10.3 g/dL — ABNORMAL LOW (ref 13.0–17.0)
MCH: 30.7 pg (ref 26.0–34.0)
MCHC: 33.9 g/dL (ref 30.0–36.0)
MCV: 90.7 fL (ref 80.0–100.0)
Platelets: 249 10*3/uL (ref 150–400)
RBC: 3.35 MIL/uL — ABNORMAL LOW (ref 4.22–5.81)
RDW: 16.7 % — ABNORMAL HIGH (ref 11.5–15.5)
WBC: 4.9 10*3/uL (ref 4.0–10.5)
nRBC: 0 % (ref 0.0–0.2)

## 2020-05-31 MED ORDER — FREESTYLE LIBRE SENSOR SYSTEM MISC: 12 refills | Status: DC

## 2020-05-31 NOTE — ED Provider Notes (Signed)
[facility] EMERGENCY DEPARTMENT Provider Note   CSN: [id number] Arrival date & time: 05/31/20  1715     History Chief Complaint  Patient presents with  . Chest Pain    Donald [name] is a 50 y.o. male w/ h/o [id number] complicated by retinopathy, peripheral neuropathy and L great toe amputation, CAD s/p CABG x2 (2016), HTN, ESRD on PD, and HLD who presents to the ED for chest pain. Substernal sharp chest pain radiating to L shoulder. No associated SOB, diaphoresis, or nausea. Worse with exertion and improved with rest. Patient reports intermittent exertional chest pain multiple times over the last several months. Today's pain more severe and lasted longer than previous episodes, prompting him to come to ED. Chest pain lasted approximately 1.5 hrs and resolved on its own.  The history is provided by the patient and medical records.  Chest Pain Pain location:  Substernal area Pain quality: sharp   Pain radiates to:  L shoulder Pain severity:  Severe Onset quality:  Sudden Duration:  2 hours Timing:  Constant Progression:  Resolved Chronicity:  New Context comment:  Washing car Relieved by:  Rest Worsened by:  Exertion Associated symptoms: no abdominal pain, no back pain, no cough, no diaphoresis, no fatigue, no fever, no headache, no lower extremity edema, no nausea, no palpitations, no shortness of breath and no vomiting   Risk factors: coronary artery disease and hypertension        Past Medical History:  Diagnosis Date  . Diabetes mellitus without complication ([place])   . History of vitrectomy 2014   anterior, left  . Hx of heart bypass surgery   . Hypertension   . Renal disorder    dialysis     Patient Active Problem List   Diagnosis Date Noted  . Acute coronary syndrome with high troponin ([place]) 06/01/2020  . Complication of vascular access for dialysis 05/31/2020  . Age-related nuclear cataract of right eye 05/26/2020  . Pseudophakia, left eye  05/26/2020  . Right epiretinal membrane 05/26/2020  . Controlled type 2 diabetes mellitus with stable proliferative retinopathy of both eyes, with long-term current use of insulin ([place]) 05/26/2020  . 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 ([place]) 05/20/2020  . Primary insomnia 05/20/2020  . Hyperlipidemia associated with type 2 diabetes mellitus ([place]) 05/20/2020  . History of amputation of left great toe ([place]) 05/20/2020  . Elevated troponin   . GI bleed 01/01/2020  . Hematemesis 12/31/2019  . Type 2 diabetes mellitus with peripheral neuropathy ([place]) 12/31/2019  . Essential hypertension   . ESRD on peritoneal dialysis ([place])   . HLD (hyperlipidemia)   . Hypertensive urgency 07/14/2019  . Chest pain     Past Surgical History:  Procedure Laterality Date  . AV FISTULA PLACEMENT    . CARDIAC SURGERY    . COLONOSCOPY WITH PROPOFOL N/A 02/11/2020   Procedure: COLONOSCOPY WITH PROPOFOL;  Surgeon: Toledo, Benay Pike, MD;  Location: [facility] ENDOSCOPY;  Service: Gastroenterology;  Laterality: N/A;  . CORONARY ARTERY BYPASS GRAFT  2016  . ESOPHAGOGASTRODUODENOSCOPY (EGD) WITH PROPOFOL N/A 01/02/2020   Procedure: ESOPHAGOGASTRODUODENOSCOPY (EGD) WITH PROPOFOL;  Surgeon: Lesly Rubenstein, MD;  Location: [facility] ENDOSCOPY;  Service: Endoscopy;  Laterality: N/A;  . INSERTION OF DIALYSIS CATHETER    . left great toe amputation    . VITRECTOMY Left 2014       Family History  Problem Relation Age of Onset  . Diabetes Mellitus II Mother   .  Heart disease Mother   . Diabetes Mother   . Hypertension Mother   . Kidney failure Mother   . Diabetes Maternal Grandmother   . Hyperlipidemia Maternal Grandmother   . Dementia Maternal Grandmother     Social History   Tobacco Use  . Smoking status: Never Smoker  . Smokeless tobacco: Never Used  Vaping Use  . Vaping Use: Never used  Substance Use Topics  . Alcohol use: Yes    Comment: occasionally  . Drug  use: Yes    Types: Marijuana    Comment: occasionally    Home Medications Prior to Admission medications   Medication Sig Start Date End Date Taking? Authorizing Provider  aspirin EC 81 MG EC tablet Take 1 tablet (81 mg total) by mouth daily. 07/17/19  Yes Nita Sells, MD  Calcium Acetate, Phos Binder, (PHOSLO PO) Take 4 capsules by mouth 3 (three) times daily with meals. 12/05/19  Yes [provider]  carvedilol (COREG) 6.25 MG tablet Take 6.25 mg by mouth 2 (two) times daily with a meal.   Yes [provider]  cinacalcet (SENSIPAR) 30 MG tablet Take 30 mg by mouth daily.   Yes [provider]  Continuous Blood Gluc Sensor (FREESTYLE LIBRE SENSOR SYSTEM) MISC Change sensor Q 2 wks 05/31/20  Yes Ladell Pier, MD  furosemide (LASIX) 80 MG tablet Take 1 tablet (80 mg total) by mouth 2 (two) times daily. 07/16/19  Yes Nita Sells, MD  gentamicin cream (GARAMYCIN) 0.1 % Apply 1 application topically every morning. Apply to exit site every day   Yes [provider]  insulin aspart (NOVOLOG) 100 UNIT/ML FlexPen Inject 6 Units into the skin 3 (three) times daily with meals. 05/20/20  Yes Ladell Pier, MD  insulin glargine (LANTUS SOLOSTAR) 100 UNIT/ML Solostar Pen Inject 20 Units into the skin daily. 05/20/20  Yes Ladell Pier, MD  Insulin Pen Needle 29G X 10MM MISC 1 each by Does not apply route in the morning, at noon, and at bedtime. 07/16/19  Yes Nita Sells, MD  lactulose (CHRONULAC) 10 GM/15ML solution Take 10 g by mouth daily as needed for mild constipation. 05/06/20  Yes [provider]  NIFEdipine (ADALAT CC) 60 MG 24 hr tablet Take 60 mg by mouth 2 (two) times daily. 09/24/19  Yes [provider]  ondansetron (ZOFRAN ODT) 4 MG disintegrating tablet Take 1 tablet (4 mg total) by mouth every 8 (eight) hours as needed for nausea or vomiting. 01/04/20  Yes Sheikh, Omair Latif, DO  paricalcitol (ZEMPLAR) 1 MCG  capsule Take 3 capsules (3 mcg total) by mouth daily. 07/16/19  Yes Nita Sells, MD  rosuvastatin (CRESTOR) 40 MG tablet Take 40 mg by mouth at bedtime. 12/19/19  Yes [provider]  senna (SENOKOT) 8.6 MG tablet Take 2 tablets (17.2 mg total) by mouth daily. Patient taking differently: Take 2 tablets by mouth daily as needed for constipation. 07/16/19  Yes Nita Sells, MD  extraneal, ICODEXTRIN, peritoneal dialysis solution (ICODEXTRIN) 7.5 % As directed by nephro Patient not taking: No sig reported 07/16/19   Nita Sells, MD  Peritoneal Dialysis Solutions (DIALYSIS SOLUTION 2.5% LOW-MG/LOW-CA) 394 MOSM/L SOLN dianeal solution As per nephro Patient not taking: No sig reported 07/16/19   Nita Sells, MD    Allergies    Lisinopril and Other  Review of Systems   Review of Systems  Constitutional: Negative for chills, diaphoresis, fatigue and fever.  HENT: Negative for ear pain and sore throat.  Eyes: Negative for pain and visual disturbance.  Respiratory: Negative for cough and shortness of breath.   Cardiovascular: Positive for chest pain. Negative for palpitations.  Gastrointestinal: Negative for abdominal pain, nausea and vomiting.  Genitourinary: Negative for dysuria and hematuria.  Musculoskeletal: Negative for arthralgias and back pain.  Skin: Negative for color change and rash.  Neurological: Negative for seizures, syncope and headaches.  All other systems reviewed and are negative.   Physical Exam Updated Vital Signs BP 136/77   Pulse 83   Temp 98 F (36.7 C) (Oral)   Resp (!) 22   Ht '5\' 10"'$  (1.778 m)   Wt 108 kg   SpO2 99%   BMI 34.16 kg/m   Physical Exam Vitals and nursing note reviewed.  Constitutional:      General: He is awake. He is not in acute distress.    Appearance: Normal appearance. He is well-developed and well-nourished. He is not ill-appearing or diaphoretic.  HENT:     Head: Normocephalic and atraumatic.      Right Ear: External ear normal.     Left Ear: External ear normal.     Nose: Nose normal.     Mouth/Throat:     Mouth: Mucous membranes are moist.     Pharynx: Oropharynx is clear.  Eyes:     General: No scleral icterus.       Right eye: No discharge.        Left eye: No discharge.     Conjunctiva/sclera: Conjunctivae normal.  Cardiovascular:     Rate and Rhythm: Normal rate and regular rhythm.     Pulses: Normal pulses.          Radial pulses are 2+ on the right side and 2+ on the left side.     Heart sounds: Normal heart sounds. No murmur heard.   Pulmonary:     Effort: Pulmonary effort is normal. No respiratory distress.     Breath sounds: Normal breath sounds. No wheezing, rhonchi or rales.  Abdominal:     General: Abdomen is flat. There is no distension.     Palpations: Abdomen is soft.     Tenderness: There is no abdominal tenderness. There is no guarding or rebound.  Musculoskeletal:        General: No edema.     Cervical back: Neck supple.     Right lower leg: No edema.     Left lower leg: No edema.  Skin:    General: Skin is warm and dry.     Findings: No rash.  Neurological:     General: No focal deficit present.     Mental Status: He is alert and oriented to person, place, and time.     Sensory: No sensory deficit.     Motor: No weakness.  Psychiatric:        Mood and Affect: Mood and affect and mood normal.        Behavior: Behavior normal. Behavior is cooperative.     ED Results / Procedures / Treatments   Labs (all labs ordered are listed, but only abnormal results are displayed) Labs Reviewed  BASIC METABOLIC PANEL - Abnormal; Notable for the following components:      Result Value   Sodium 131 (*)    Chloride 93 (*)    Glucose, Bld 140 (*)    BUN 77 (*)    Creatinine, Ser 17.38 (*)    Calcium 8.2 (*)    GFR, Estimated 3 (*)  All other components within normal limits  CBC - Abnormal; Notable for the following components:   RBC 3.35 (*)     Hemoglobin 10.3 (*)    HCT 30.4 (*)    RDW 16.7 (*)    All other components within normal limits  TROPONIN I (HIGH SENSITIVITY) - Abnormal; Notable for the following components:   Troponin I (High Sensitivity) 65 (*)    All other components within normal limits  TROPONIN I (HIGH SENSITIVITY) - Abnormal; Notable for the following components:   Troponin I (High Sensitivity) 219 (*)    All other components within normal limits  HEPARIN LEVEL (UNFRACTIONATED)    EKG EKG Interpretation  Date/Time:  Monday May 31 2020 17:36:53 EST Ventricular Rate:  85 PR Interval:  180 QRS Duration: 106 QT Interval:  446 QTC Calculation: 530 R Axis:   72 Text Interpretation: Sinus rhythm with Premature atrial complexes with Abberant conduction ST & T wave abnormality, consider inferolateral ischemia Prolonged QT Abnormal ECG st elevation lead v2 with diffuse nsst inferior and lateral leads similar to prior ekg of 01 January 2020 Confirmed by Pattricia Boss [id number]) on 05/31/2020 7:53:11 PM   Radiology DG Chest 2 View  Result Date: 05/31/2020 CLINICAL DATA:  Chest pain EXAM: CHEST - 2 VIEW COMPARISON:  July 14, 2019 FINDINGS: There is scarring in the left base. Lungs elsewhere are clear. Heart is borderline enlarged with pulmonary vascularity normal. Patient is status post median sternotomy. There is a stent in the left subclavian-axillary junction region. No bone lesions. No pneumothorax. IMPRESSION: Scarring left base. Lungs elsewhere clear. Borderline cardiac enlargement. Postoperative changes noted. Electronically Signed   By: Lowella Grip III M.D.   On: 05/31/2020 19:01   VAS US DUPLEX DIALYSIS ACCESS (AVF,AVG)  Result Date: 05/31/2020 DIALYSIS ACCESS Reason for Exam: Lt shoulder pian. Access Site: Left Upper Extremity. Access Type: Brachial-cephalic AVF. History: AVF not been used for 2 years. Performing Technologist: Concha Norway RVT  Examination Guidelines: A complete evaluation includes  B-mode imaging, spectral Doppler, color Doppler, and power Doppler as needed of all accessible portions of each vessel. Unilateral testing is considered an integral part of a complete examination. Limited examinations for reoccurring indications may be performed as noted.  Findings: +--------------------+----------+-----------------+--------+ AVF                 PSV (cm/s)Flow Vol (mL/min)Comments +--------------------+----------+-----------------+--------+ Native artery inflow   153           966                +--------------------+----------+-----------------+--------+ AVF Anastomosis        229                              +--------------------+----------+-----------------+--------+  +---------------+----------+-------------+----------+--------+ OUTFLOW VEIN   PSV (cm/s)Diameter (cm)Depth (cm)Describe +---------------+----------+-------------+----------+--------+ Subclavian vein   336                                    +---------------+----------+-------------+----------+--------+ Confluence        425                                    +---------------+----------+-------------+----------+--------+ Prox UA           101        0.73                        +---------------+----------+-------------+----------+--------+  Mid UA             57        0.95                        +---------------+----------+-------------+----------+--------+ Dist UA           199        1.05                        +---------------+----------+-------------+----------+--------+  +---------------+------------+----------+---------+--------+------------------+                  Diameter  Depth (cm)Branching  PSV      Flow Volume                        (cm)                        (cm/s)      (ml/min)      +---------------+------------+----------+---------+--------+------------------+ Left Rad Art                                     76                      dis                                                                       +---------------+------------+----------+---------+--------+------------------+ antegrade                                                                +---------------+------------+----------+---------+--------+------------------+  Summary: Patent left AVF with increased velocites at the confluence and decreased flow volume. This AVF has not been used for two years.  *See table(s) above for measurements and observations.  Diagnosing physician: Hortencia Pilar MD Electronically signed by Hortencia Pilar MD on 05/31/2020 at 5:06:23 PM.    --------------------------------------------------------------------------------   Final     Procedures Procedures  Medications Ordered in ED Medications  nitroGLYCERIN (NITROSTAT) SL tablet 0.4 mg (has no administration in time range)  heparin bolus via infusion 4,000 Units (has no administration in time range)  heparin ADULT infusion 100 units/mL (25000 units/253m) (has no administration in time range)  aspirin EC tablet 81 mg (has no administration in time range)  aspirin chewable tablet 324 mg (has no administration in time range)    Or  aspirin suppository 300 mg (has no administration in time range)  acetaminophen (TYLENOL) tablet 650 mg (has no administration in time range)    Or  acetaminophen (TYLENOL) suppository 650 mg (has no administration in time range)  ondansetron (ZOFRAN) tablet 4 mg (has no administration in time range)    Or  ondansetron (ZOFRAN) injection 4 mg (has no administration in time range)    ED Course  I have reviewed the triage vital signs and the  nursing notes.  Pertinent labs & imaging results that were available during my care of the patient were reviewed by me and considered in my medical decision making (see chart for details).    MDM Rules/Calculators/A&P                          Patient is a 72yoM w/ history and physical as described above who  presents to the ED for chest pain. VS reassuring and HDS. Patient chest-pain free in ED. He is resting comfortably and in no acute distress. Initial workup includes CBC, BMP, troponin, ECG, and CXR. No acute ischemic or dysrhythmic changes on ECG. CXR unremarkable. Troponin 65 -> 219. Discussed patient with Cards who recommend med admit as well as heparin gtt for NSTEMI. No indication for emergent dialysis at this time. Satting well on RA with no respiratory distress. Discussed patient with medicine who will admit. Patient otherwise remained HDS with no further acute events during ED course.   Final Clinical Impression(s) / ED Diagnoses Final diagnoses:  Precordial pain    Rx / DC Orders ED Discharge Orders    None       Christy Gentles, MD 06/01/20 [id number]    Pattricia Boss, MD 06/02/20 [phone]

## 2020-05-31 NOTE — Progress Notes (Signed)
MRN : [id number]  Donald [name] is a 50 y.o. (October 17, 1970) male who presents with chief complaint of No chief complaint on file. [name]  History of Present Illness:   The patient returns to the office for follow up regarding problem with the dialysis access. Currently the patient is maintained via a PD catheter but he has a left brachial cephalic fistula.    He has begun having increased left shoulder pain which has occurred multiple times in the past and correlates with a cephalic vein stenosis.  He has had several interventions in the past with good result.   The patient denies hand pain or other symptoms consistent with steal phenomena.  No significant arm swelling.  The patient denies redness or swelling at the access site. The patient denies fever or chills at home or while on dialysis.  The patient denies amaurosis fugax or recent TIA symptoms. There are no recent neurological changes noted. The patient denies claudication symptoms or rest pain symptoms. The patient denies history of DVT, PE or superficial thrombophlebitis. The patient denies recent episodes of angina or shortness of breath.    No outpatient medications have been marked as taking for the 05/31/20 encounter (Appointment) with Delana Meyer, Dolores Lory, MD.    Past Medical History:  Diagnosis Date  . Diabetes mellitus without complication ([place])   . History of vitrectomy 2014   anterior, left  . Hx of heart bypass surgery   . Hypertension   . Renal disorder    dialysis     Past Surgical History:  Procedure Laterality Date  . AV FISTULA PLACEMENT    . CARDIAC SURGERY    . COLONOSCOPY WITH PROPOFOL N/A 02/11/2020   Procedure: COLONOSCOPY WITH PROPOFOL;  Surgeon: Toledo, Benay Pike, MD;  Location: [facility] ENDOSCOPY;  Service: Gastroenterology;  Laterality: N/A;  . CORONARY ARTERY BYPASS GRAFT  2016  . ESOPHAGOGASTRODUODENOSCOPY (EGD) WITH PROPOFOL N/A 01/02/2020   Procedure: ESOPHAGOGASTRODUODENOSCOPY (EGD) WITH PROPOFOL;   Surgeon: Lesly Rubenstein, MD;  Location: [facility] ENDOSCOPY;  Service: Endoscopy;  Laterality: N/A;  . INSERTION OF DIALYSIS CATHETER    . left great toe amputation    . VITRECTOMY Left 2014    Social History Social History   Tobacco Use  . Smoking status: Never Smoker  . Smokeless tobacco: Never Used  Vaping Use  . Vaping Use: Never used  Substance Use Topics  . Alcohol use: Yes    Comment: occasionally  . Drug use: Yes    Types: Marijuana    Comment: occasionally    Family History Family History  Problem Relation Age of Onset  . Diabetes Mellitus II Mother   . Heart disease Mother   . Diabetes Mother   . Hypertension Mother   . Kidney failure Mother   . Diabetes Maternal Grandmother   . Hyperlipidemia Maternal Grandmother   . Dementia Maternal Grandmother   No family history of bleeding/clotting disorders, porphyria or autoimmune disease   Allergies  Allergen Reactions  . Lisinopril Hives  . Other     Other reaction(s): EGGPLANT     REVIEW OF SYSTEMS (Negative unless checked)  Constitutional: '[]'$ Weight loss  '[]'$ Fever  '[]'$ Chills Cardiac: '[]'$ Chest pain   '[]'$ Chest pressure   '[]'$ Palpitations   '[]'$ Shortness of breath when laying flat   '[]'$ Shortness of breath with exertion. Vascular:  '[]'$ Pain in legs with walking   '[]'$ Pain in legs at rest  '[]'$ History of DVT   '[]'$ Phlebitis   '[]'$ Swelling in legs   '[]'$ Varicose veins   '[]'$   Non-healing ulcers Pulmonary:   '[]'$ Uses home oxygen   '[]'$ Productive cough   '[]'$ Hemoptysis   '[]'$ Wheeze  '[]'$ COPD   '[]'$ Asthma Neurologic:  '[]'$ Dizziness   '[]'$ Seizures   '[]'$ History of stroke   '[]'$ History of TIA  '[]'$ Aphasia   '[]'$ Vissual changes   '[]'$ Weakness or numbness in arm   '[]'$ Weakness or numbness in leg Musculoskeletal:   '[]'$ Joint swelling   '[]'$ Joint pain   '[]'$ Low back pain Hematologic:  '[]'$ Easy bruising  '[]'$ Easy bleeding   '[]'$ Hypercoagulable state   '[]'$ Anemic Gastrointestinal:  '[]'$ Diarrhea   '[]'$ Vomiting  '[]'$ Gastroesophageal reflux/heartburn   '[]'$ Difficulty swallowing. Genitourinary:   '[x]'$ Chronic kidney disease   '[]'$ Difficult urination  '[]'$ Frequent urination   '[]'$ Blood in urine Skin:  '[]'$ Rashes   '[]'$ Ulcers  Psychological:  '[]'$ History of anxiety   '[]'$  History of major depression.  Physical Examination  There were no vitals filed for this visit. There is no height or weight on file to calculate BMI. Gen: WD/WN, NAD Head: [place]/AT, No temporalis wasting.  Ear/Nose/Throat: Hearing grossly intact, nares w/o erythema or drainage, poor dentition Eyes: PER, EOMI, sclera nonicteric.  Neck: Supple, no masses.  No bruit or JVD.  Pulmonary:  Good air movement, clear to auscultation bilaterally, no use of accessory muscles.  Cardiac: RRR, normal S1, S2, no Murmurs. Vascular: left brachial cephalic fistula pulsatile  Vessel Right Left  Radial Palpable Palpable  Brachial Palpable Palpable  Carotid Palpable Palpable  Gastrointestinal: soft, non-distended. No guarding/no peritoneal signs. PD catheter present Musculoskeletal: M/S 5/5 throughout.  No deformity or atrophy.  Neurologic: CN 2-12 intact. Pain and light touch intact in extremities.  Symmetrical.  Speech is fluent. Motor exam as listed above. Psychiatric: Judgment intact, Mood & affect appropriate for pt's clinical situation. Dermatologic: No rashes or ulcers noted.  No changes consistent with cellulitis.  CBC Lab Results  Component Value Date   WBC 4.9 04/08/2020   HGB 10.4 (L) 04/08/2020   HCT 29.3 (L) 04/08/2020   MCV 88.8 04/08/2020   PLT 270 04/08/2020    BMET    Component Value Date/Time   NA 130 (L) 04/08/2020 0020   K 4.3 04/08/2020 0020   CL 87 (L) 04/08/2020 0020   CO2 23 04/08/2020 0020   GLUCOSE 119 (H) 04/08/2020 0020   BUN 72 (H) 04/08/2020 0020   CREATININE 18.12 (H) 04/08/2020 0020   CALCIUM 8.9 04/08/2020 0020   GFRNONAA 3 (L) 04/08/2020 0020   GFRAA 4 (L) 01/04/2020 0730   CrCl cannot be calculated (Patient's most recent lab result is older than the maximum 21 days allowed.).  COAG Lab Results   Component Value Date   INR 1.2 12/31/2019   INR 1.1 07/14/2019    Radiology OCT, Retina - OU - Both Eyes  Result Date: 05/26/2020 Right Eye Quality was good. Scan locations included subfoveal. Central Foveal Thickness: 267. Progression has no prior data. Findings include epiretinal membrane. Left Eye Quality was good. Scan locations included subfoveal. Central Foveal Thickness: 161. Progression has no prior data. Findings include epiretinal membrane. Notes OS with diffuse foveal atrophy, no active maculopathy Epiretinal membrane right eye mild, no active intraretinal fluid nor cystoid macular edema nor CSME  Color Fundus Photography Optos - OU - Both Eyes  Result Date: 05/26/2020 Right Eye Progression has no prior data. Disc findings include neovascularization. Macula : epiretinal membrane. Left Eye Progression has no prior data. Notes OD with epiretinal membrane mild to moderate topographic distortion or changes, old inactive neovascularization from the optic nerve now fibrotic proliferation of the disc  stable with traction on the residual low-lying posterior partial vitreous detachment OS, foveal atrophy accounts for acuity most likely as a consequence of prior advanced diabetic retinopathy with loss of the outer ellipsoid zone Layers.  This accounts for acuity.  Mild epiretinal membrane temporally, not visually pathologic. Bilateral proliferative diabetic retinopathy, quiescent    Assessment/Plan 1. Complication of vascular access for dialysis, initial encounter Recommend:  The patient is experiencing increasing problems with their left arm dialysis access.  Patient should have a left fistulagram with the intention for intervention.  The intention for intervention is to restore appropriate flow and prevent thrombosis and possible loss of the access.   The risks, benefits and alternative therapies were reviewed in detail with the patient.  All questions were answered.  The patient agrees  to proceed with angio/intervention.    He will continue his PD as his primary dialysis method  2. ESRD on peritoneal dialysis ([place]) At the present time the patient has adequate dialysis access.  Continue hemodialysis as ordered without interruption.  Avoid nephrotoxic medications and dehydration.  Further plans per nephrology  3. Essential hypertension Continue antihypertensive medications as already ordered, these medications have been reviewed and there are no changes at this time.   4. 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  Continue statin as ordered and reviewed, no changes at this time  Nitrates PRN for chest pain   5. Type 2 diabetes mellitus with peripheral neuropathy ([facility]) Continue hypoglycemic medications as already ordered, these medications have been reviewed and there are no changes at this time.  Hgb A1C to be monitored as already arranged by primary service     Hortencia Pilar, MD  05/31/2020 1:16 PM

## 2020-05-31 NOTE — Consult Note (Signed)
[facility] Consult Note   Primary Physician: None listed Primary Cardiologist:  Dorris Carnes, MD  Reason for Consultation: Chest pain  HPI:    Donald [name] is a 50 year old gentleman with a past medical history significant for CAD (CABG x 2 in 2016 in [place]), ESRD on peritoneal dialysis, type 2 diabetes mellitus, left great toe amputation, peripheral neuropathy, anemia/prior GI bleeding, hypertension and hyperlipidemia who presented to the hospital after an episode of chest pain.  The patient has been complaining of intermittent chest pain for the past few months.  Today he had resting chest pain while he was washing his car.  Pain was sharp, substernal with radiation to the left shoulder.  He denies any diaphoresis, nausea or vomiting.  It lasted for more than 1 hour.  He denies any orthopnea, paroxysmal nocturnal dyspnea or lower extremity edema.  In the ED the highest blood pressure was 159/90.  Heart rate was 83 bpm and an O2 saturation of 95%.  The chest x-ray did not show any vascular congestion. The ECG from 05/31/2020 (at 1736) showed sinus rhythm, rate 85 bpm, LVH, T wave abnormalities in the inferolateral leads and slight ST depressions in II III and aVF.  High-sensitivity troponins were 65 and 219. Other labs were: Potassium 3.5, BUN 77, creatinine 17.38, WBC 4.9, hemoglobin 10.3, hematocrit 30.4 and platelets 249.  Pertinent testing Echo April 2021 1. Left ventricular ejection fraction, by estimation, is 60 to 65%. The left ventricle has normal function. The left ventricle has no regional wall motion abnormalities. There is mild left ventricular hypertrophy. Left ventricular diastolic parameters are consistent with Grade I diastolic dysfunction (impaired relaxation). 2. Right ventricular systolic function is mildly reduced. The right ventricular size is normal. Tricuspid regurgitation signal is inadequate for assessing PA pressure. 3. The mitral valve is grossly  normal. Trivial mitral valve regurgitation. 4. The aortic valve is tricuspid. Aortic valve regurgitation is not visualized. 5. The inferior vena cava is dilated in size with <50% respiratory variability, suggesting right atrial pressure of 15 mmHg.   Home Medications Prior to Admission medications   Medication Sig Start Date End Date Taking? Authorizing Provider  aspirin EC 81 MG EC tablet Take 1 tablet (81 mg total) by mouth daily. 07/17/19   Nita Sells, MD  Calcium Acetate, Phos Binder, (PHOSLO PO) Take 4 capsules by mouth 3 (three) times daily with meals. 12/05/19   [provider]  carvedilol (COREG) 12.5 MG tablet Take 1 tablet (12.5 mg total) by mouth 2 (two) times daily with a meal. 07/16/19   Nita Sells, MD  cinacalcet (SENSIPAR) 30 MG tablet Take 30 mg by mouth daily. Patient not taking: No sig reported    [provider]  Continuous Blood Gluc Sensor (FREESTYLE LIBRE SENSOR SYSTEM) MISC Change sensor Q 2 wks 05/31/20   Ladell Pier, MD  extraneal, ICODEXTRIN, peritoneal dialysis solution (ICODEXTRIN) 7.5 % As directed by nephro Patient not taking: No sig reported 07/16/19   Nita Sells, MD  furosemide (LASIX) 80 MG tablet Take 1 tablet (80 mg total) by mouth 2 (two) times daily. 07/16/19   Nita Sells, MD  gentamicin cream (GARAMYCIN) 0.1 % Apply 1 application topically every morning. Apply to exit site every day    [provider]  insulin aspart (NOVOLOG) 100 UNIT/ML FlexPen Inject 6 Units into the skin 3 (three) times daily with meals. 05/20/20   Ladell Pier, MD  insulin glargine (LANTUS SOLOSTAR) 100 UNIT/ML Solostar Pen Inject  20 Units into the skin daily. 05/20/20   Ladell Pier, MD  Insulin Pen Needle 29G X 10MM MISC 1 each by Does not apply route in the morning, at noon, and at bedtime. 07/16/19   Nita Sells, MD  lactulose [facility]) 10 GM/15ML solution SMARTSIG:Milliliter(s) By Mouth 05/06/20    [provider]  NIFEdipine (ADALAT CC) 60 MG 24 hr tablet Take 60 mg by mouth 2 (two) times daily. 09/24/19   [provider]  ondansetron (ZOFRAN ODT) 4 MG disintegrating tablet Take 1 tablet (4 mg total) by mouth every 8 (eight) hours as needed for nausea or vomiting. Patient not taking: Reported on 05/31/2020 01/04/20   Raiford Noble Latif, DO  paricalcitol (ZEMPLAR) 1 MCG capsule Take 3 capsules (3 mcg total) by mouth daily. Patient not taking: No sig reported 07/16/19   Nita Sells, MD  Peritoneal Dialysis Solutions (DIALYSIS SOLUTION 2.5% LOW-MG/LOW-CA) 394 MOSM/L SOLN dianeal solution As per nephro Patient not taking: No sig reported 07/16/19   Nita Sells, MD  rosuvastatin (CRESTOR) 10 MG tablet Take 10 mg by mouth at bedtime. 12/19/19   [provider]  senna (SENOKOT) 8.6 MG tablet Take 2 tablets (17.2 mg total) by mouth daily. Patient not taking: No sig reported 07/16/19   Nita Sells, MD    Past Medical History: Past Medical History:  Diagnosis Date  . Diabetes mellitus without complication ([place])   . History of vitrectomy 2014   anterior, left  . Hx of heart bypass surgery   . Hypertension   . Renal disorder    dialysis     Past Surgical History: Past Surgical History:  Procedure Laterality Date  . AV FISTULA PLACEMENT    . CARDIAC SURGERY    . COLONOSCOPY WITH PROPOFOL N/A 02/11/2020   Procedure: COLONOSCOPY WITH PROPOFOL;  Surgeon: Toledo, Benay Pike, MD;  Location: [facility] ENDOSCOPY;  Service: Gastroenterology;  Laterality: N/A;  . CORONARY ARTERY BYPASS GRAFT  2016  . ESOPHAGOGASTRODUODENOSCOPY (EGD) WITH PROPOFOL N/A 01/02/2020   Procedure: ESOPHAGOGASTRODUODENOSCOPY (EGD) WITH PROPOFOL;  Surgeon: Lesly Rubenstein, MD;  Location: [facility] ENDOSCOPY;  Service: Endoscopy;  Laterality: N/A;  . INSERTION OF DIALYSIS CATHETER    . left great toe amputation    . VITRECTOMY Left 2014    Family History: Family History  Problem  Relation Age of Onset  . Diabetes Mellitus II Mother   . Heart disease Mother   . Diabetes Mother   . Hypertension Mother   . Kidney failure Mother   . Diabetes Maternal Grandmother   . Hyperlipidemia Maternal Grandmother   . Dementia Maternal Grandmother     Social History: Social History   Socioeconomic History  . Marital status: Married    Spouse name: Not on file  . Number of children: 6  . Years of education: Not on file  . Highest education level: Some college, no degree  Occupational History  . Not on file  Tobacco Use  . Smoking status: Never Smoker  . Smokeless tobacco: Never Used  Vaping Use  . Vaping Use: Never used  Substance and Sexual Activity  . Alcohol use: Yes    Comment: occasionally  . Drug use: Yes    Types: Marijuana    Comment: occasionally  . Sexual activity: Not on file  Other Topics Concern  . Not on file  Social History Narrative  . Not on file   Social Determinants of Health   Financial Resource Strain: Not on file  Food Insecurity:  Not on file  Transportation Needs: Not on file  Physical Activity: Not on file  Stress: Not on file  Social Connections: Not on file    Allergies:  Allergies  Allergen Reactions  . Lisinopril Hives  . Other Swelling    Other reaction(s): EGGPLANT     Review of Systems: [y] = yes, '[ ]'$  = no   . General: Weight gain '[ ]'$ ; Weight loss '[ ]'$ ; Anorexia '[ ]'$ ; Fatigue '[ ]'$ ; Fever '[ ]'$ ; Chills '[ ]'$ ; Weakness '[ ]'$   . Cardiac: Chest pain/pressure [Y]; Resting SOB '[ ]'$ ; Exertional SOB '[ ]'$ ; Orthopnea '[ ]'$ ; Pedal Edema '[ ]'$ ; Palpitations '[ ]'$ ; Syncope '[ ]'$ ; Presyncope '[ ]'$ ; Paroxysmal nocturnal dyspnea'[ ]'$   . Pulmonary: Cough '[ ]'$ ; Wheezing'[ ]'$ ; Hemoptysis'[ ]'$ ; Sputum '[ ]'$ ; Snoring '[ ]'$   . GI: Vomiting'[ ]'$ ; Dysphagia'[ ]'$ ; Melena'[ ]'$ ; Hematochezia '[ ]'$ ; Heartburn'[ ]'$ ; Abdominal pain '[ ]'$ ; Constipation '[ ]'$ ; Diarrhea '[ ]'$ ; BRBPR '[ ]'$   . GU: Hematuria'[ ]'$ ; Dysuria '[ ]'$ ; Nocturia'[ ]'$   . Vascular: Pain in legs with walking '[ ]'$ ; Pain in feet with  lying flat '[ ]'$ ; Non-healing sores '[ ]'$ ; Stroke '[ ]'$ ; TIA '[ ]'$ ; Slurred speech '[ ]'$ ;  . Neuro: Headaches'[ ]'$ ; Vertigo'[ ]'$ ; Seizures'[ ]'$ ; Paresthesias'[ ]'$ ;Blurred vision '[ ]'$ ; Diplopia '[ ]'$ ; Vision changes '[ ]'$   . Ortho/Skin: Arthritis '[ ]'$ ; Joint pain '[ ]'$ ; Muscle pain '[ ]'$ ; Joint swelling '[ ]'$ ; Back Pain '[ ]'$ ; Rash '[ ]'$   . Psych: Depression'[ ]'$ ; Anxiety'[ ]'$   . Heme: Bleeding problems '[ ]'$ ; Clotting disorders '[ ]'$ ; Anemia '[ ]'$   . Endocrine: Diabetes '[ ]'$ ; Thyroid dysfunction'[ ]'$      Objective:    Vital Signs:   Temp:  [98 F (36.7 C)-98.1 F (36.7 C)] 98 F (36.7 C) (02/21 2039) Pulse Rate:  [82-88] 83 (02/21 2145) Resp:  [16-30] 21 (02/21 2145) BP: (111-159)/(69-98) 159/90 (02/21 2145) SpO2:  [95 %-100 %] 95 % (02/21 2145) Weight:  [id number] kg] 108 kg (02/21 1402)    Weight change: There were no vitals filed for this visit.  Intake/Output:  No intake or output data in the 24 hours ending 05/31/20 2316    Physical Exam    General:  Well appearing. No resp difficulty HEENT: normal Neck: supple. JVP . Carotids 2+ bilat; no bruits. No lymphadenopathy or thyromegaly appreciated. Cor: PMI nondisplaced. Regular rate & rhythm. No rubs, gallops or murmurs. Lungs: clear Abdomen: soft, nontender, nondistended. No hepatosplenomegaly. No bruits or masses. Good bowel sounds. Extremities: no cyanosis, clubbing, rash, edema Neuro: alert & orientedx3, cranial nerves grossly intact. moves all 4 extremities w/o difficulty. Affect pleasant    EKG    The ECG from 05/31/2020 (at 1736), that I reviewed personally, showed sinus rhythm, rate 85 bpm, LVH, T wave abnormalities in the inferolateral leads and slight ST depressions in II III and aVF.  Labs   Basic Metabolic Panel: Recent Labs  Lab 05/31/20 1802  NA 131*  K 3.5  CL 93*  CO2 24  GLUCOSE 140*  BUN 77*  CREATININE 17.38*  CALCIUM 8.2*    Liver Function Tests: No results for input(s): AST, ALT, ALKPHOS, BILITOT, PROT, ALBUMIN in the last 168  hours. No results for input(s): LIPASE, AMYLASE in the last 168 hours. No results for input(s): AMMONIA in the last 168 hours.  CBC: Recent Labs  Lab 05/31/20 1802  WBC 4.9  HGB 10.3*  HCT 30.4*  MCV 90.7  PLT 249  Cardiac Enzymes: No results for input(s): CKTOTAL, CKMB, CKMBINDEX, TROPONINI in the last 168 hours.  BNP: BNP (last 3 results) No results for input(s): BNP in the last 8760 hours.  ProBNP (last 3 results) No results for input(s): PROBNP in the last 8760 hours.   CBG: No results for input(s): GLUCAP in the last 168 hours.  Coagulation Studies: No results for input(s): LABPROT, INR in the last 72 hours.   Imaging   DG Chest 2 View  Result Date: 05/31/2020 CLINICAL DATA:  Chest pain EXAM: CHEST - 2 VIEW COMPARISON:  July 14, 2019 FINDINGS: There is scarring in the left base. Lungs elsewhere are clear. Heart is borderline enlarged with pulmonary vascularity normal. Patient is status post median sternotomy. There is a stent in the left subclavian-axillary junction region. No bone lesions. No pneumothorax. IMPRESSION: Scarring left base. Lungs elsewhere clear. Borderline cardiac enlargement. Postoperative changes noted. Electronically Signed   By: Lowella Grip III M.D.   On: 05/31/2020 19:01   VAS US DUPLEX DIALYSIS ACCESS (AVF,AVG)  Result Date: 05/31/2020 DIALYSIS ACCESS Reason for Exam: Lt shoulder pian. Access Site: Left Upper Extremity. Access Type: Brachial-cephalic AVF. History: AVF not been used for 2 years. Performing Technologist: Concha Norway RVT  Examination Guidelines: A complete evaluation includes B-mode imaging, spectral Doppler, color Doppler, and power Doppler as needed of all accessible portions of each vessel. Unilateral testing is considered an integral part of a complete examination. Limited examinations for reoccurring indications may be performed as noted.  Findings: +--------------------+----------+-----------------+--------+ AVF                  PSV (cm/s)Flow Vol (mL/min)Comments +--------------------+----------+-----------------+--------+ Native artery inflow   153           966                +--------------------+----------+-----------------+--------+ AVF Anastomosis        229                              +--------------------+----------+-----------------+--------+  +---------------+----------+-------------+----------+--------+ OUTFLOW VEIN   PSV (cm/s)Diameter (cm)Depth (cm)Describe +---------------+----------+-------------+----------+--------+ Subclavian vein   336                                    +---------------+----------+-------------+----------+--------+ Confluence        425                                    +---------------+----------+-------------+----------+--------+ Prox UA           101        0.73                        +---------------+----------+-------------+----------+--------+ Mid UA             57        0.95                        +---------------+----------+-------------+----------+--------+ Dist UA           199        1.05                        +---------------+----------+-------------+----------+--------+  +---------------+------------+----------+---------+--------+------------------+  Diameter  Depth (cm)Branching  PSV      Flow Volume                        (cm)                        (cm/s)      (ml/min)      +---------------+------------+----------+---------+--------+------------------+ Left Rad Art                                     76                      dis                                                                      +---------------+------------+----------+---------+--------+------------------+ antegrade                                                                +---------------+------------+----------+---------+--------+------------------+  Summary: Patent left AVF with increased velocites at  the confluence and decreased flow volume. This AVF has not been used for two years.  *See table(s) above for measurements and observations.  Diagnosing physician: Hortencia Pilar MD Electronically signed by Hortencia Pilar MD on 05/31/2020 at 5:06:23 PM.    --------------------------------------------------------------------------------   Final       Medications:     Current Medications:    Infusions:      Assessment/Plan   1. Chest pain / Elevated troponin Patient has had exertional chest pain for a few months.  Today he developed an episode of resting chest pain that lasted for greater than 1 hour.  High-sensitivity troponins are 65 and 219.  The electrocardiogram showed slight ST depressions in the inferior leads that subsequently resolved.  Patient has known CAD with a CABG in 2016.  -Trend cardiac enzymes -Serial ECGs -Aspirin 81 mg daily -Continue carvedilol, nifedipine -Repeat lipid panel -Continue rosuvastatin -Nitroglycerin as needed for chest pain -IV heparin infusion (since the patient is ruling in for an NSTEMI) -Repeat transthoracic echocardiogram    Meade Maw, MD  05/31/2020, 11:16 PM  Cardiology Overnight Team Please contact [facility] Cardiology for night-coverage after hours (4p -7a ) and weekends on [email or web address]

## 2020-05-31 NOTE — ED Triage Notes (Signed)
Pt was washing his car and had sudden onset of intense CP that has eased only a little still 6/10. Has not taken any medications. Hx triple bypass.

## 2020-05-31 NOTE — Telephone Encounter (Signed)
Medication: Insulin Pen Needle 29G X 10MM MISC [id number] , Continuous Blood Gluc Sensor ([place]) MISC [id number] (pharmacy states never received script for [facility])  Has the patient contacted their pharmacy? YES (Agent: If no, request that the patient contact the pharmacy for the refill.) (Agent: If yes, when and what did the pharmacy advise?)  Preferred Pharmacy (with phone number or street name): [facility] [phone] - [place], [place] [address] [place] [address] Phone: [phone] Fax: [phone] Hours: Not open 24 hours    Agent: Please be advised that RX refills may take up to 3 business days. We ask that you follow-up with your pharmacy.

## 2020-06-01 ENCOUNTER — Encounter (HOSPITAL_COMMUNITY): Payer: Self-pay | Admitting: Internal Medicine

## 2020-06-01 ENCOUNTER — Ambulatory Visit (HOSPITAL_COMMUNITY): Payer: Medicare Other

## 2020-06-01 DIAGNOSIS — D649 Anemia, unspecified: Secondary | ICD-10-CM | POA: Diagnosis present

## 2020-06-01 DIAGNOSIS — I214 Non-ST elevation (NSTEMI) myocardial infarction: Secondary | ICD-10-CM | POA: Diagnosis not present

## 2020-06-01 DIAGNOSIS — E785 Hyperlipidemia, unspecified: Secondary | ICD-10-CM | POA: Diagnosis present

## 2020-06-01 DIAGNOSIS — Z833 Family history of diabetes mellitus: Secondary | ICD-10-CM | POA: Diagnosis not present

## 2020-06-01 DIAGNOSIS — I249 Acute ischemic heart disease, unspecified: Secondary | ICD-10-CM | POA: Diagnosis present

## 2020-06-01 DIAGNOSIS — N186 End stage renal disease: Secondary | ICD-10-CM | POA: Diagnosis present

## 2020-06-01 DIAGNOSIS — Z79899 Other long term (current) drug therapy: Secondary | ICD-10-CM | POA: Diagnosis not present

## 2020-06-01 DIAGNOSIS — R072 Precordial pain: Secondary | ICD-10-CM | POA: Diagnosis present

## 2020-06-01 DIAGNOSIS — Z951 Presence of aortocoronary bypass graft: Secondary | ICD-10-CM | POA: Diagnosis not present

## 2020-06-01 DIAGNOSIS — Z20822 Contact with and (suspected) exposure to covid-19: Secondary | ICD-10-CM | POA: Diagnosis present

## 2020-06-01 DIAGNOSIS — E669 Obesity, unspecified: Secondary | ICD-10-CM | POA: Diagnosis present

## 2020-06-01 DIAGNOSIS — Z992 Dependence on renal dialysis: Secondary | ICD-10-CM

## 2020-06-01 DIAGNOSIS — R9431 Abnormal electrocardiogram [ECG] [EKG]: Secondary | ICD-10-CM

## 2020-06-01 DIAGNOSIS — E1142 Type 2 diabetes mellitus with diabetic polyneuropathy: Secondary | ICD-10-CM | POA: Diagnosis present

## 2020-06-01 DIAGNOSIS — Z8249 Family history of ischemic heart disease and other diseases of the circulatory system: Secondary | ICD-10-CM | POA: Diagnosis not present

## 2020-06-01 DIAGNOSIS — E113593 Type 2 diabetes mellitus with proliferative diabetic retinopathy without macular edema, bilateral: Secondary | ICD-10-CM | POA: Diagnosis present

## 2020-06-01 DIAGNOSIS — E1122 Type 2 diabetes mellitus with diabetic chronic kidney disease: Secondary | ICD-10-CM | POA: Diagnosis present

## 2020-06-01 DIAGNOSIS — N2581 Secondary hyperparathyroidism of renal origin: Secondary | ICD-10-CM | POA: Diagnosis present

## 2020-06-01 DIAGNOSIS — I5043 Acute on chronic combined systolic (congestive) and diastolic (congestive) heart failure: Secondary | ICD-10-CM | POA: Diagnosis present

## 2020-06-01 DIAGNOSIS — Z7982 Long term (current) use of aspirin: Secondary | ICD-10-CM | POA: Diagnosis not present

## 2020-06-01 DIAGNOSIS — Z794 Long term (current) use of insulin: Secondary | ICD-10-CM | POA: Diagnosis not present

## 2020-06-01 DIAGNOSIS — E871 Hypo-osmolality and hyponatremia: Secondary | ICD-10-CM | POA: Diagnosis present

## 2020-06-01 DIAGNOSIS — I132 Hypertensive heart and chronic kidney disease with heart failure and with stage 5 chronic kidney disease, or end stage renal disease: Secondary | ICD-10-CM | POA: Diagnosis present

## 2020-06-01 DIAGNOSIS — D631 Anemia in chronic kidney disease: Secondary | ICD-10-CM | POA: Diagnosis present

## 2020-06-01 DIAGNOSIS — I10 Essential (primary) hypertension: Secondary | ICD-10-CM

## 2020-06-01 DIAGNOSIS — Z888 Allergy status to other drugs, medicaments and biological substances status: Secondary | ICD-10-CM | POA: Diagnosis not present

## 2020-06-01 DIAGNOSIS — R079 Chest pain, unspecified: Secondary | ICD-10-CM | POA: Diagnosis not present

## 2020-06-01 DIAGNOSIS — I2510 Atherosclerotic heart disease of native coronary artery without angina pectoris: Secondary | ICD-10-CM | POA: Diagnosis not present

## 2020-06-01 DIAGNOSIS — Z841 Family history of disorders of kidney and ureter: Secondary | ICD-10-CM | POA: Diagnosis not present

## 2020-06-01 DIAGNOSIS — Z89422 Acquired absence of other left toe(s): Secondary | ICD-10-CM | POA: Diagnosis not present

## 2020-06-01 LAB — CBG MONITORING, ED
Glucose-Capillary: 83 mg/dL (ref 70–99)
Glucose-Capillary: 93 mg/dL (ref 70–99)

## 2020-06-01 LAB — HEMOGLOBIN A1C
Hgb A1c MFr Bld: 6.4 % — ABNORMAL HIGH (ref 4.8–5.6)
Mean Plasma Glucose: 136.98 mg/dL

## 2020-06-01 LAB — ECHOCARDIOGRAM COMPLETE
Area-P 1/2: 3.03 cm2
Height: 70 in
S' Lateral: 4.3 cm
Weight: 3809.55 oz

## 2020-06-01 LAB — TROPONIN I (HIGH SENSITIVITY): Troponin I (High Sensitivity): 598 ng/L (ref <18)

## 2020-06-01 LAB — HEPARIN LEVEL (UNFRACTIONATED)
Heparin Unfractionated: 0.24 IU/mL — ABNORMAL LOW (ref 0.30–0.70)
Heparin Unfractionated: 0.29 IU/mL — ABNORMAL LOW (ref 0.30–0.70)

## 2020-06-01 LAB — SARS CORONAVIRUS 2 (TAT 6-24 HRS): SARS Coronavirus 2: NEGATIVE

## 2020-06-01 LAB — MAGNESIUM: Magnesium: 2.1 mg/dL (ref 1.7–2.4)

## 2020-06-01 MED ORDER — LACTULOSE 10 GM/15ML PO SOLN: 10.0000 g | Freq: Every day | ORAL | Status: DC | PRN

## 2020-06-01 MED ORDER — HEPARIN BOLUS VIA INFUSION
4000.0000 [IU] | Freq: Once | INTRAVENOUS | Status: AC
  Administered 2020-06-01: 4000 [IU] via INTRAVENOUS
  Filled 2020-06-01: qty 4000

## 2020-06-01 MED ORDER — ONDANSETRON HCL 4 MG/2ML IJ SOLN: 4.0000 mg | Freq: Four times a day (QID) | INTRAMUSCULAR | Status: DC | PRN

## 2020-06-01 MED ORDER — ASPIRIN 81 MG PO CHEW
324.0000 mg | CHEWABLE_TABLET | ORAL | Status: AC
  Administered 2020-06-01: 324 mg via ORAL

## 2020-06-01 MED ORDER — HEPARIN (PORCINE) 25000 UT/250ML-% IV SOLN
1450.0000 [IU]/h | INTRAVENOUS | Status: DC
  Administered 2020-06-01: 1300 [IU]/h via INTRAVENOUS
  Filled 2020-06-01: qty 250

## 2020-06-01 MED ORDER — CALCIUM ACETATE (PHOS BINDER) 667 MG PO CAPS
1334.0000 mg | ORAL_CAPSULE | Freq: Three times a day (TID) | ORAL | Status: DC
  Administered 2020-06-01 – 2020-06-03 (×5): 1334 mg via ORAL
  Filled 2020-06-01 (×7): qty 2

## 2020-06-01 MED ORDER — ASPIRIN EC 81 MG PO TBEC
81.0000 mg | DELAYED_RELEASE_TABLET | Freq: Every day | ORAL | Status: DC
  Administered 2020-06-02: 81 mg via ORAL
  Filled 2020-06-01: qty 1

## 2020-06-01 MED ORDER — HEPARIN (PORCINE) 25000 UT/250ML-% IV SOLN
1600.0000 [IU]/h | INTRAVENOUS | Status: AC
  Administered 2020-06-01: 1450 [IU]/h via INTRAVENOUS
  Filled 2020-06-01 (×2): qty 250

## 2020-06-01 MED ORDER — CARVEDILOL 3.125 MG PO TABS
6.2500 mg | ORAL_TABLET | Freq: Two times a day (BID) | ORAL | Status: DC
Start: 2020-06-01 — End: 2020-06-01

## 2020-06-01 MED ORDER — ROSUVASTATIN CALCIUM 20 MG PO TABS
40.0000 mg | ORAL_TABLET | Freq: Every day | ORAL | Status: DC
  Administered 2020-06-01 – 2020-06-02 (×2): 40 mg via ORAL
  Filled 2020-06-01: qty 2

## 2020-06-01 MED ORDER — CARVEDILOL 12.5 MG PO TABS
12.5000 mg | ORAL_TABLET | Freq: Two times a day (BID) | ORAL | Status: DC
  Administered 2020-06-01 – 2020-06-03 (×4): 12.5 mg via ORAL
  Filled 2020-06-01 (×3): qty 1

## 2020-06-01 MED ORDER — DELFLEX-LC/2.5% DEXTROSE 394 MOSM/L IP SOLN: INTRAPERITONEAL | Status: DC

## 2020-06-01 MED ORDER — PARICALCITOL 1 MCG PO CAPS
3.0000 ug | ORAL_CAPSULE | Freq: Every day | ORAL | Status: DC
  Administered 2020-06-01 – 2020-06-03 (×3): 3 ug via ORAL
  Filled 2020-06-01 (×3): qty 3

## 2020-06-01 MED ORDER — ASPIRIN 300 MG RE SUPP: 300.0000 mg | RECTAL | Status: AC

## 2020-06-01 MED ORDER — PROCHLORPERAZINE EDISYLATE 10 MG/2ML IJ SOLN: 5.0000 mg | INTRAMUSCULAR | Status: DC | PRN

## 2020-06-01 MED ORDER — FUROSEMIDE 80 MG PO TABS
80.0000 mg | ORAL_TABLET | Freq: Two times a day (BID) | ORAL | Status: DC
  Administered 2020-06-01 – 2020-06-03 (×5): 80 mg via ORAL
  Filled 2020-06-01 (×3): qty 4
  Filled 2020-06-01 (×2): qty 1

## 2020-06-01 MED ORDER — INSULIN GLARGINE 100 UNIT/ML ~~LOC~~ SOLN
20.0000 [IU] | Freq: Every day | SUBCUTANEOUS | Status: DC
  Administered 2020-06-01 – 2020-06-02 (×2): 20 [IU] via SUBCUTANEOUS
  Filled 2020-06-01 (×3): qty 0.2

## 2020-06-01 MED ORDER — ACETAMINOPHEN 650 MG RE SUPP: 650.0000 mg | Freq: Four times a day (QID) | RECTAL | Status: DC | PRN

## 2020-06-01 MED ORDER — NIFEDIPINE ER OSMOTIC RELEASE 60 MG PO TB24
60.0000 mg | ORAL_TABLET | Freq: Two times a day (BID) | ORAL | Status: DC
  Administered 2020-06-01 – 2020-06-03 (×5): 60 mg via ORAL
  Filled 2020-06-01 (×7): qty 1

## 2020-06-01 MED ORDER — SENNA 8.6 MG PO TABS: 2.0000 | ORAL_TABLET | Freq: Every day | ORAL | Status: DC | PRN

## 2020-06-01 MED ORDER — HEPARIN 1000 UNIT/ML FOR PERITONEAL DIALYSIS: 500.0000 [IU] | INTRAMUSCULAR | Status: DC | PRN

## 2020-06-01 MED ORDER — CINACALCET HCL 30 MG PO TABS
30.0000 mg | ORAL_TABLET | Freq: Every day | ORAL | Status: DC
  Administered 2020-06-01 – 2020-06-03 (×3): 30 mg via ORAL
  Filled 2020-06-01 (×3): qty 1

## 2020-06-01 MED ORDER — ONDANSETRON HCL 4 MG PO TABS: 4.0000 mg | ORAL_TABLET | Freq: Four times a day (QID) | ORAL | Status: DC | PRN

## 2020-06-01 MED ORDER — NITROGLYCERIN 0.4 MG SL SUBL: 0.4000 mg | SUBLINGUAL_TABLET | SUBLINGUAL | Status: DC | PRN

## 2020-06-01 MED ORDER — INSULIN ASPART 100 UNIT/ML ~~LOC~~ SOLN: 0.0000 [IU] | Freq: Every day | SUBCUTANEOUS | Status: DC

## 2020-06-01 MED ORDER — INSULIN ASPART 100 UNIT/ML ~~LOC~~ SOLN
0.0000 [IU] | Freq: Three times a day (TID) | SUBCUTANEOUS | Status: DC
  Administered 2020-06-02: 2 [IU] via SUBCUTANEOUS

## 2020-06-01 MED ORDER — HEPARIN 1000 UNIT/ML FOR PERITONEAL DIALYSIS
INTRAPERITONEAL | Status: DC | PRN
  Filled 2020-06-01 (×3): qty 5000

## 2020-06-01 MED ORDER — GENTAMICIN SULFATE 0.1 % EX CREA
1.0000 "application " | TOPICAL_CREAM | Freq: Every morning | CUTANEOUS | Status: DC
  Filled 2020-06-01: qty 15

## 2020-06-01 MED ORDER — ACETAMINOPHEN 325 MG PO TABS: 650.0000 mg | ORAL_TABLET | Freq: Four times a day (QID) | ORAL | Status: DC | PRN

## 2020-06-01 MED ORDER — GENTAMICIN SULFATE 0.1 % EX CREA
1.0000 "application " | TOPICAL_CREAM | Freq: Every day | CUTANEOUS | Status: DC
  Administered 2020-06-02: 1 via TOPICAL
  Filled 2020-06-01 (×2): qty 15

## 2020-06-01 NOTE — Progress Notes (Addendum)
[place] for heparin Indication: chest pain/ACS  Allergies  Allergen Reactions  . Lisinopril Hives  . Other Swelling    Other reaction(s): EGGPLANT    Patient Measurements: Height: '5\' 10"'$  (177.8 cm) Weight: 108 kg (238 lb 1.6 oz) IBW/kg (Calculated) : 73 Heparin Dosing Weight: 95kg  Vital Signs: BP: 164/92 (02/22 1050) Pulse Rate: 84 (02/22 1050)  Labs: Recent Labs    05/31/20 1802 05/31/20 2045 06/01/20 0923  HGB 10.3*  --   --   HCT 30.4*  --   --   PLT 249  --   --   HEPARINUNFRC  --   --  0.24*  CREATININE 17.38*  --   --   TROPONINIHS 65* 219*  --     Estimated Creatinine Clearance: 6.4 mL/min (A) (by C-G formula based on SCr of 17.38 mg/dL (H)).   Medical History: Past Medical History:  Diagnosis Date  . Diabetes mellitus without complication ([place])   . History of vitrectomy 2014   anterior, left  . Hx of heart bypass surgery   . Hypertension   . Renal disorder    dialysis     Assessment: 50yo male w/ h/o CABG c/o sudden onset of intense CP, troponin elevated and rising, to begin heparin. Cardiology monitoring for now.  Initial heparin level low at 0.24. Hg 10.3, plt wnl (last 2/21). No bleeding or issues with infusion per discussion with RN.  Goal of Therapy:  Heparin level 0.3-0.7 units/ml Monitor platelets by anticoagulation protocol: Yes   Plan:  Increase heparin to 1450 units/hr F/u 8hr heparin level Monitor daily CBC, s/sx bleeding Planning heparin x 48hrs per discussion with Dr. Acie Fredrickson - stop date entered   Arturo Morton, PharmD, BCPS Please check AMION for all [place] contact numbers Clinical Pharmacist 06/01/2020 11:06 AM

## 2020-06-01 NOTE — Progress Notes (Signed)
  Echocardiogram 2D Echocardiogram has been performed.  Randa Lynn Torrie Lafavor 06/01/2020, 9:57 AM

## 2020-06-01 NOTE — ED Notes (Signed)
Spoke to lab, heparin rate increased per pharmacy, pt made aware

## 2020-06-01 NOTE — ED Notes (Signed)
Admitting MD paged regarding pt's request for diet order

## 2020-06-01 NOTE — Consult Note (Signed)
[name] Renal Consultation Note    Indication for Consultation:  Management of ESRD/hemodialysis; anemia, hypertension/volume and secondary hyperparathyroidism  [id number], Dalbert Batman, MD  HPI: Donald [name] is a 51 y.o. male with ESRD on PD (performed at home). His past medical history is significant for CAD s/p CABG X 2 (2016), HTN, Hyperlipidemia, T2DM c/b retinopathy and peripheral neuropathy, and L great toe amputation. Patient presents to ER c/o chest pain which has started yesterday evening. He reports having episodes of chest pain that "comes and goes" in the past. He reports worsening chest pain yesterday evening which wasn't going away, prompting him to go the the ER. Patient received ASA and Heparin infusion and will be admitted for further evaluation. CXR showed scarring of left base but otherwise clear. Cardiology also consulted. Patient seen and examined today at bedside. He appears comfortable and resting. Currently, denies chest pain, shortness of breath, sweating, ABD pain, and N/V/D.  Past Medical History:  Diagnosis Date  . Diabetes mellitus without complication ([place])   . History of vitrectomy 2014   anterior, left  . Hx of heart bypass surgery   . Hypertension   . Renal disorder    dialysis    Past Surgical History:  Procedure Laterality Date  . AV FISTULA PLACEMENT    . CARDIAC SURGERY    . COLONOSCOPY WITH PROPOFOL N/A 02/11/2020   Procedure: COLONOSCOPY WITH PROPOFOL;  Surgeon: Toledo, Benay Pike, MD;  Location: [facility] ENDOSCOPY;  Service: Gastroenterology;  Laterality: N/A;  . CORONARY ARTERY BYPASS GRAFT  2016  . ESOPHAGOGASTRODUODENOSCOPY (EGD) WITH PROPOFOL N/A 01/02/2020   Procedure: ESOPHAGOGASTRODUODENOSCOPY (EGD) WITH PROPOFOL;  Surgeon: Lesly Rubenstein, MD;  Location: [facility] ENDOSCOPY;  Service: Endoscopy;  Laterality: N/A;  . INSERTION OF DIALYSIS CATHETER    . left great toe amputation    . VITRECTOMY Left 2014   Family History   Problem Relation Age of Onset  . Diabetes Mellitus II Mother   . Heart disease Mother   . Diabetes Mother   . Hypertension Mother   . Kidney failure Mother   . Diabetes Maternal Grandmother   . Hyperlipidemia Maternal Grandmother   . Dementia Maternal Grandmother    Social History:  reports that he has never smoked. He has never used smokeless tobacco. He reports current alcohol use. He reports current drug use. Drug: Marijuana. Allergies  Allergen Reactions  . Lisinopril Hives  . Other Swelling    Other reaction(s): EGGPLANT   Prior to Admission medications   Medication Sig Start Date End Date Taking? Authorizing Provider  aspirin EC 81 MG EC tablet Take 1 tablet (81 mg total) by mouth daily. 07/17/19  Yes Nita Sells, MD  Calcium Acetate, Phos Binder, (PHOSLO PO) Take 4 capsules by mouth 3 (three) times daily with meals. 12/05/19  Yes [provider]  carvedilol (COREG) 6.25 MG tablet Take 6.25 mg by mouth 2 (two) times daily with a meal.   Yes [provider]  cinacalcet (SENSIPAR) 30 MG tablet Take 30 mg by mouth daily.   Yes [provider]  Continuous Blood Gluc Sensor (FREESTYLE LIBRE SENSOR SYSTEM) MISC Change sensor Q 2 wks 05/31/20  Yes Ladell Pier, MD  furosemide (LASIX) 80 MG tablet Take 1 tablet (80 mg total) by mouth 2 (two) times daily. 07/16/19  Yes Nita Sells, MD  gentamicin cream (GARAMYCIN) 0.1 % Apply 1 application topically every morning. Apply to exit site every day   Yes [provider]  insulin aspart (NOVOLOG) 100 UNIT/ML FlexPen Inject 6 Units into the skin 3 (three) times daily with meals. 05/20/20  Yes Ladell Pier, MD  insulin glargine (LANTUS SOLOSTAR) 100 UNIT/ML Solostar Pen Inject 20 Units into the skin daily. 05/20/20  Yes Ladell Pier, MD  Insulin Pen Needle 29G X 10MM MISC 1 each by Does not apply route in the morning, at noon, and at bedtime. 07/16/19  Yes Nita Sells, MD   lactulose (CHRONULAC) 10 GM/15ML solution Take 10 g by mouth daily as needed for mild constipation. 05/06/20  Yes [provider]  NIFEdipine (ADALAT CC) 60 MG 24 hr tablet Take 60 mg by mouth 2 (two) times daily. 09/24/19  Yes [provider]  ondansetron (ZOFRAN ODT) 4 MG disintegrating tablet Take 1 tablet (4 mg total) by mouth every 8 (eight) hours as needed for nausea or vomiting. 01/04/20  Yes Sheikh, Omair Latif, DO  paricalcitol (ZEMPLAR) 1 MCG capsule Take 3 capsules (3 mcg total) by mouth daily. 07/16/19  Yes Nita Sells, MD  rosuvastatin (CRESTOR) 40 MG tablet Take 40 mg by mouth at bedtime. 12/19/19  Yes [provider]  senna (SENOKOT) 8.6 MG tablet Take 2 tablets (17.2 mg total) by mouth daily. Patient taking differently: Take 2 tablets by mouth daily as needed for constipation. 07/16/19  Yes Nita Sells, MD  extraneal, ICODEXTRIN, peritoneal dialysis solution (ICODEXTRIN) 7.5 % As directed by nephro Patient not taking: No sig reported 07/16/19   Nita Sells, MD  Peritoneal Dialysis Solutions (DIALYSIS SOLUTION 2.5% LOW-MG/LOW-CA) 394 MOSM/L SOLN dianeal solution As per nephro Patient not taking: No sig reported 07/16/19   Nita Sells, MD   Current Facility-Administered Medications  Medication Dose Route Frequency Provider Last Rate Last Admin  . acetaminophen (TYLENOL) tablet 650 mg  650 mg Oral Q6H PRN Reubin Milan, MD       Or  . acetaminophen (TYLENOL) suppository 650 mg  650 mg Rectal Q6H PRN Reubin Milan, MD      . Derrill Memo ON 06/02/2020] aspirin EC tablet 81 mg  81 mg Oral Daily Reubin Milan, MD      . calcium acetate (PHOSLO) capsule 1,334 mg  1,334 mg Oral TID WC Reubin Milan, MD   1,334 mg at 06/01/20 1200  . carvedilol (COREG) tablet 12.5 mg  12.5 mg Oral BID WC Nahser, Wonda Cheng, MD      . cinacalcet [facility]) tablet 30 mg  30 mg Oral Daily Reubin Milan, MD   30 mg at 06/01/20 1023  .  dialysis solution 2.5% low-MG/low-CA dianeal solution   Intraperitoneal Q24H Pahwani, Rinka R, MD      . furosemide (LASIX) tablet 80 mg  80 mg Oral BID Reubin Milan, MD   80 mg at 06/01/20 1020  . gentamicin cream (GARAMYCIN) 0.1 % 1 application  1 application Topical Daily Penninger, Lindsay, PA      . heparin 2,500 Units in dialysis solution 2.5% low-MG/low-CA 5,000 mL dialysis solution   Peritoneal Dialysis PRN Pahwani, Rinka R, MD      . heparin ADULT infusion 100 units/mL (25000 units/234m)  1,450 Units/hr Intravenous Continuous Nahser, PWonda Cheng MD 14.5 mL/hr at 06/01/20 1127 1,450 Units/hr at 06/01/20 1127  . insulin aspart (novoLOG) injection 0-15 Units  0-15 Units Subcutaneous TID WC Pahwani, Rinka R, MD      . insulin aspart (novoLOG) injection 0-5 Units  0-5 Units Subcutaneous QHS Pahwani, Rinka R, MD      .  insulin glargine (LANTUS) injection 20 Units  20 Units Subcutaneous Daily Pahwani, Rinka R, MD   20 Units at 06/01/20 1352  . lactulose (CHRONULAC) 10 GM/15ML solution 10 g  10 g Oral Daily PRN Reubin Milan, MD      . NIFEdipine (PROCARDIA XL/NIFEDICAL XL) 24 hr tablet 60 mg  60 mg Oral BID Reubin Milan, MD   60 mg at 06/01/20 1022  . nitroGLYCERIN (NITROSTAT) SL tablet 0.4 mg  0.4 mg Sublingual Q5 min PRN Reubin Milan, MD      . paricalcitol [facility]) capsule 3 mcg  3 mcg Oral Daily Reubin Milan, MD   3 mcg at 06/01/20 1021  . prochlorperazine (COMPAZINE) injection 5 mg  5 mg Intravenous Q4H PRN Reubin Milan, MD      . rosuvastatin (CRESTOR) tablet 40 mg  40 mg Oral QHS Reubin Milan, MD   40 mg at 06/01/20 0103  . senna (SENOKOT) tablet 17.2 mg  2 tablet Oral Daily PRN Reubin Milan, MD       Current Outpatient Medications  Medication Sig Dispense Refill  . aspirin EC 81 MG EC tablet Take 1 tablet (81 mg total) by mouth daily. 30 tablet 3  . Calcium Acetate, Phos Binder, (PHOSLO PO) Take 4 capsules by mouth 3 (three) times  daily with meals.    . carvedilol (COREG) 6.25 MG tablet Take 6.25 mg by mouth 2 (two) times daily with a meal.    . cinacalcet (SENSIPAR) 30 MG tablet Take 30 mg by mouth daily.    . Continuous Blood Gluc Sensor (FREESTYLE LIBRE SENSOR SYSTEM) MISC Change sensor Q 2 wks 2 each 12  . furosemide (LASIX) 80 MG tablet Take 1 tablet (80 mg total) by mouth 2 (two) times daily. 60 tablet 3  . gentamicin cream (GARAMYCIN) 0.1 % Apply 1 application topically every morning. Apply to exit site every day    . insulin aspart (NOVOLOG) 100 UNIT/ML FlexPen Inject 6 Units into the skin 3 (three) times daily with meals. 15 mL 11  . insulin glargine (LANTUS SOLOSTAR) 100 UNIT/ML Solostar Pen Inject 20 Units into the skin daily. 15 mL 11  . Insulin Pen Needle 29G X 10MM MISC 1 each by Does not apply route in the morning, at noon, and at bedtime. 60 each 3  . lactulose (CHRONULAC) 10 GM/15ML solution Take 10 g by mouth daily as needed for mild constipation.    [name] NIFEdipine (ADALAT CC) 60 MG 24 hr tablet Take 60 mg by mouth 2 (two) times daily.    . ondansetron (ZOFRAN ODT) 4 MG disintegrating tablet Take 1 tablet (4 mg total) by mouth every 8 (eight) hours as needed for nausea or vomiting. 20 tablet 0  . paricalcitol (ZEMPLAR) 1 MCG capsule Take 3 capsules (3 mcg total) by mouth daily. 90 capsule 3  . rosuvastatin (CRESTOR) 40 MG tablet Take 40 mg by mouth at bedtime.    . senna (SENOKOT) 8.6 MG tablet Take 2 tablets (17.2 mg total) by mouth daily. (Patient taking differently: Take 2 tablets by mouth daily as needed for constipation.) 30 tablet 3  . extraneal, ICODEXTRIN, peritoneal dialysis solution (ICODEXTRIN) 7.5 % As directed by nephro (Patient not taking: No sig reported)    . Peritoneal Dialysis Solutions (DIALYSIS SOLUTION 2.5% LOW-MG/LOW-CA) 394 MOSM/L SOLN dianeal solution As per nephro (Patient not taking: No sig reported)  0   Labs: Basic Metabolic Panel: Recent Labs  Lab 05/31/20 1802  NA 131*  K  3.5  CL 93*  CO2 24  GLUCOSE 140*  BUN 77*  CREATININE 17.38*  CALCIUM 8.2*   CBC: Recent Labs  Lab 05/31/20 1802  WBC 4.9  HGB 10.3*  HCT 30.4*  MCV 90.7  PLT 249   CBG: Recent Labs  Lab 06/01/20 1017  GLUCAP 83   Studies/Results: DG Chest 2 View  Result Date: 05/31/2020 CLINICAL DATA:  Chest pain EXAM: CHEST - 2 VIEW COMPARISON:  July 14, 2019 FINDINGS: There is scarring in the left base. Lungs elsewhere are clear. Heart is borderline enlarged with pulmonary vascularity normal. Patient is status post median sternotomy. There is a stent in the left subclavian-axillary junction region. No bone lesions. No pneumothorax. IMPRESSION: Scarring left base. Lungs elsewhere clear. Borderline cardiac enlargement. Postoperative changes noted. Electronically Signed   By: Lowella Grip III M.D.   On: 05/31/2020 19:01   VAS US DUPLEX DIALYSIS ACCESS (AVF,AVG)  Result Date: 05/31/2020 DIALYSIS ACCESS Reason for Exam: Lt shoulder pian. Access Site: Left Upper Extremity. Access Type: Brachial-cephalic AVF. History: AVF not been used for 2 years. Performing Technologist: Concha Norway RVT  Examination Guidelines: A complete evaluation includes B-mode imaging, spectral Doppler, color Doppler, and power Doppler as needed of all accessible portions of each vessel. Unilateral testing is considered an integral part of a complete examination. Limited examinations for reoccurring indications may be performed as noted.  Findings: +--------------------+----------+-----------------+--------+ AVF                 PSV (cm/s)Flow Vol (mL/min)Comments +--------------------+----------+-----------------+--------+ Native artery inflow   153           966                +--------------------+----------+-----------------+--------+ AVF Anastomosis        229                              +--------------------+----------+-----------------+--------+   +---------------+----------+-------------+----------+--------+ OUTFLOW VEIN   PSV (cm/s)Diameter (cm)Depth (cm)Describe +---------------+----------+-------------+----------+--------+ Subclavian vein   336                                    +---------------+----------+-------------+----------+--------+ Confluence        425                                    +---------------+----------+-------------+----------+--------+ Prox UA           101        0.73                        +---------------+----------+-------------+----------+--------+ Mid UA             57        0.95                        +---------------+----------+-------------+----------+--------+ Dist UA           199        1.05                        +---------------+----------+-------------+----------+--------+  +---------------+------------+----------+---------+--------+------------------+                  Diameter  Depth (cm)Branching  PSV      Flow Volume                        (cm)                        (cm/s)      (ml/min)      +---------------+------------+----------+---------+--------+------------------+ Left Rad Art                                     76                      dis                                                                      +---------------+------------+----------+---------+--------+------------------+ antegrade                                                                +---------------+------------+----------+---------+--------+------------------+  Summary: Patent left AVF with increased velocites at the confluence and decreased flow volume. This AVF has not been used for two years.  *See table(s) above for measurements and observations.  Diagnosing physician: Hortencia Pilar MD Electronically signed by Hortencia Pilar MD on 05/31/2020 at 5:06:23 PM.    --------------------------------------------------------------------------------   Final     ECHOCARDIOGRAM COMPLETE  Result Date: 06/01/2020    ECHOCARDIOGRAM REPORT   Patient Name:   Donald [name] Date of Exam: 06/01/2020 Medical Rec #:  [id number]      Height:       70.0 in Accession #:    [id number]     Weight:       238.1 lb Date of Birth:  1971-03-11      BSA:          2.248 m Patient Age:    82 years       BP:           181/82 mmHg Patient Gender: M              HR:           89 bpm. Exam Location:  Inpatient Procedure: 2D Echo, Cardiac Doppler and Color Doppler Indications:    R07.9* Chest pain, unspecified  History:        Patient has prior history of Echocardiogram examinations, most                 recent 07/16/2019. Risk Factors:Hypertension and Diabetes. Renal                 Disorder.  Sonographer:    Jonelle Sidle Dance Referring Phys: [id number] [place]  1. Left ventricular ejection fraction, by estimation, is 35 to 40%. The left ventricle has moderately decreased function. The left ventricle demonstrates regional wall motion abnormalities (see scoring diagram/findings for description). The left ventricular internal cavity size was mildly dilated. There is moderate left  ventricular hypertrophy. Left ventricular diastolic parameters are consistent with Grade I diastolic dysfunction (impaired relaxation). There is moderate hypokinesis of the left ventricular, entire inferior wall.  2. Right ventricular systolic function is moderately reduced. The right ventricular size is not well visualized.  3. Left atrial size was moderately dilated.  4. Right atrial size was mildly dilated.  5. The mitral valve is normal in structure. Trivial mitral valve regurgitation. No evidence of mitral stenosis.  6. The aortic valve is tricuspid. There is mild calcification of the aortic valve. There is mild thickening of the aortic valve. Aortic valve regurgitation is not visualized. Mild aortic valve sclerosis is present, with no evidence of aortic valve stenosis.  7. The inferior vena cava is  normal in size with greater than 50% respiratory variability, suggesting right atrial pressure of 3 mmHg. Comparison(s): Changes from prior study are noted. Conclusion(s)/Recommendation(s): EF now reduced. There is global hypokinesis, with more prominent moderate/severe hypokinesis in entire inferior wall. RV size/free wall not well visualized. FINDINGS  Left Ventricle: Left ventricular ejection fraction, by estimation, is 35 to 40%. The left ventricle has moderately decreased function. The left ventricle demonstrates regional wall motion abnormalities. Moderate hypokinesis of the left ventricular, entire inferior wall. The left ventricular internal cavity size was mildly dilated. There is moderate left ventricular hypertrophy. Left ventricular diastolic parameters are consistent with Grade I diastolic dysfunction (impaired relaxation). Right Ventricle: The right ventricular size is not well visualized. Right vetricular wall thickness was not well visualized. Right ventricular systolic function is moderately reduced. Left Atrium: Left atrial size was moderately dilated. Right Atrium: Right atrial size was mildly dilated. Pericardium: There is no evidence of pericardial effusion. Mitral Valve: The mitral valve is normal in structure. There is mild calcification of the mitral valve leaflet(s). Trivial mitral valve regurgitation. No evidence of mitral valve stenosis. Tricuspid Valve: The tricuspid valve is normal in structure. Tricuspid valve regurgitation is trivial. No evidence of tricuspid stenosis. Aortic Valve: Focal nodular calcification on RCC. The aortic valve is tricuspid. There is mild calcification of the aortic valve. There is mild thickening of the aortic valve. Aortic valve regurgitation is not visualized. Mild aortic valve sclerosis is present, with no evidence of aortic valve stenosis. Pulmonic Valve: The pulmonic valve was not well visualized. Pulmonic valve regurgitation is not visualized. No evidence  of pulmonic stenosis. Aorta: The aortic root, ascending aorta, aortic arch and descending aorta are all structurally normal, with no evidence of dilitation or obstruction. Venous: The inferior vena cava is normal in size with greater than 50% respiratory variability, suggesting right atrial pressure of 3 mmHg. IAS/Shunts: The atrial septum is grossly normal. Additional Comments: There is a small pleural effusion in both left and right lateral regions.  LEFT VENTRICLE PLAX 2D LVIDd:         5.40 cm  Diastology LVIDs:         4.30 cm  LV e' medial:    4.03 cm/s LV PW:         1.60 cm  LV E/e' medial:  14.6 LV IVS:        1.30 cm  LV e' lateral:   7.36 cm/s LVOT diam:     1.90 cm  LV E/e' lateral: 8.0 LV SV:         46 LV SV Index:   21 LVOT Area:     2.84 cm  RIGHT VENTRICLE            IVC RV Basal diam:  2.80 cm    IVC diam: 1.90 cm RV S prime:     6.48 cm/s TAPSE (M-mode): 1.3 cm LEFT ATRIUM             Index       RIGHT ATRIUM           Index LA diam:        4.70 cm 2.09 cm/m  RA Area:     17.70 cm LA Vol (A2C):   90.7 ml 40.35 ml/m RA Volume:   46.90 ml  20.86 ml/m LA Vol (A4C):   68.4 ml 30.43 ml/m LA Biplane Vol: 81.3 ml 36.17 ml/m  AORTIC VALVE LVOT Vmax:   87.70 cm/s LVOT Vmean:  52.600 cm/s LVOT VTI:    0.163 m  AORTA Ao Root diam: 3.20 cm Ao Asc diam:  3.10 cm MITRAL VALVE MV Area (PHT): 3.03 cm    SHUNTS MV Decel Time: 250 msec    Systemic VTI:  0.16 m MV E velocity: 58.80 cm/s  Systemic Diam: 1.90 cm MV A velocity: 94.10 cm/s MV E/A ratio:  0.62 Buford Dresser MD Electronically signed by Buford Dresser MD Signature Date/Time: 06/01/2020/11:09:32 AM    Final     ROS:  General: No fever, chills  HEENT: No recent headaches or visual changes Neurologic: No dizziness Cardiac: No recent episodes of chest pain/pressure,  shortness of breath at rest or DOE.  Pulmonary: No home oxygen or cough  Physical Exam: Vitals:   06/01/20 1430 06/01/20 1500 06/01/20 1530 06/01/20 1600  BP:  (!) 175/94 (!) 170/89 (!) 172/90 (!) 144/100  Pulse: 87 90 89 87  Resp: '16 16 18 15  '$ Temp:      TempSrc:      SpO2: 93% 96% 99% 98%  Weight:      Height:         General: WDWN NAD Head: NCAT sclera not icteric  Lungs: On RA; CTA bilaterally. No wheeze, rales or rhonchi. Breathing is unlabored. Heart: S1 and S2; Auscultated premature beat; No murmur or friction rub Abdomen: soft, nontender, +BS, no guarding, no rebound tenderness Lower extremities: no edema upper/lower extremities, bilateral hips Neuro: AAOx3. Moves all extremities spontaneously. Psych:  Responds to questions appropriately with a normal affect. Dialysis Access: RUQ PD Catheter  Dialysis Orders: PD Number of fills: 5 exchanges Fill volume: 2900 Total volume: 16000 EDW: 107.5kg Fill Time: 65mn Dwell Time: 1.5hrs Drain time: 273m Of note: Patient has a day dwell but will not be performed during this hospitalization.  Access: RUQ PD Catheter Epogen 6,000 units 2x/month  Last Labs: Hgb 10.3, K 3.5, Ca 8.2  Assessment/Plan: 1.  NSTEMI: Per previous notes, troponin trending upward. Continuous Heparin infusion. Cardiology consulted. 2.  ESRD -  On PD at home. Orders placed for PD treatment tonight. 3.  Hypertension/volume  - BP trend reviewed. Bps elevated; however slightly trending down. Continue Carvedilol, Lasix, and Nifedipine. 4.  Anemia of CKD - Hgb 10.3; Fe/ESA on hold for now. Will monitor CBC and resume if needed. 5.  Secondary Hyperparathyroidism - Ca 8.2; Will order PO4 with next blood draw. Continue Sensipar and Paricalcitol.  6.  Nutrition - Renal diet with fluid restriction; Will order albumin level.  CoTobie PoetNP [facility] 06/01/2020, 4:04 PM

## 2020-06-01 NOTE — ED Notes (Signed)
Patient transported to CT 

## 2020-06-01 NOTE — H&P (Signed)
History and Physical    Donald [name] [id number] DOB: 1971-02-23 DOA: 05/31/2020  PCP: Ladell Pier, MD   Patient coming from: Home.  I have personally briefly reviewed patient's old medical records in [place]  Chief Complaint: Chest pain.  HPI: Donald [name] is a 50 y.o. male with medical history significant of type 2 diabetes, history of vitrectomy, CAD, CABG, hypertension, hyperlipidemia, ESRD on peritoneal dialysis who is coming to the emergency department with complaints of sharp precordial chest pain radiated to his left arm associated with dyspnea that happened while he was washing his car.  The patient states that he has been having chest pain for months, but usually resolves within 5 minutes.  However, this episode lasted for over an hour so he called EMS.  He denies dizziness, diaphoresis, nausea or emesis, PND, orthopnea or pitting edema of the lower extremities.  He denies abdominal pain, diarrhea, constipation, melena hematochezia.  No dysuria, frequency or hematuria.  No polyuria, polydipsia, polyphagia or blurred vision.  ED Course: Initial vital signs were temperature 98.1 F, pulse 83, respirations 20, BP 134/75 mmHg O2 sat 100% on room air.  The patient received aspirin and was started on a heparin infusion.  Work-up: EKG was sinus rhythm with some PACs nonspecific ST/T wave abnormality and prolonged QT.Initial troponin was 66 and follow-up troponin 219 ng/liter.  CBC showed a white count of 4.9, hemoglobin 10.3 g/dL platelets 249.  BMP showed potassium of 3.5, sodium 131, chloride 93 and CO2 24 mmol/L.  Blood glucose 140, BUN 77 and creatinine is 17.38 mg/dL.  Two-view chest radiograph shows eschar on the left base, but lungs are otherwise clear.  There is borderline cardiomegaly.  Review of Systems: As per HPI otherwise all other systems reviewed and are negative.  Past Medical History:  Diagnosis Date  . Diabetes mellitus without complication ([place])   .  History of vitrectomy 2014   anterior, left  . Hx of heart bypass surgery   . Hypertension   . Renal disorder    dialysis     Past Surgical History:  Procedure Laterality Date  . AV FISTULA PLACEMENT    . CARDIAC SURGERY    . COLONOSCOPY WITH PROPOFOL N/A 02/11/2020   Procedure: COLONOSCOPY WITH PROPOFOL;  Surgeon: Toledo, Benay Pike, MD;  Location: [facility] ENDOSCOPY;  Service: Gastroenterology;  Laterality: N/A;  . CORONARY ARTERY BYPASS GRAFT  2016  . ESOPHAGOGASTRODUODENOSCOPY (EGD) WITH PROPOFOL N/A 01/02/2020   Procedure: ESOPHAGOGASTRODUODENOSCOPY (EGD) WITH PROPOFOL;  Surgeon: Lesly Rubenstein, MD;  Location: [facility] ENDOSCOPY;  Service: Endoscopy;  Laterality: N/A;  . INSERTION OF DIALYSIS CATHETER    . left great toe amputation    . VITRECTOMY Left 2014    Social History  reports that he has never smoked. He has never used smokeless tobacco. He reports current alcohol use. He reports current drug use. Drug: Marijuana.  Allergies  Allergen Reactions  . Lisinopril Hives  . Other Swelling    Other reaction(s): EGGPLANT    Family History  Problem Relation Age of Onset  . Diabetes Mellitus II Mother   . Heart disease Mother   . Diabetes Mother   . Hypertension Mother   . Kidney failure Mother   . Diabetes Maternal Grandmother   . Hyperlipidemia Maternal Grandmother   . Dementia Maternal Grandmother    Prior to Admission medications   Medication Sig Start Date End Date Taking? Authorizing Provider  aspirin EC 81 MG EC tablet Take 1  tablet (81 mg total) by mouth daily. 07/17/19  Yes Nita Sells, MD  Calcium Acetate, Phos Binder, (PHOSLO PO) Take 4 capsules by mouth 3 (three) times daily with meals. 12/05/19  Yes [provider]  carvedilol (COREG) 6.25 MG tablet Take 6.25 mg by mouth 2 (two) times daily with a meal.   Yes [provider]  cinacalcet (SENSIPAR) 30 MG tablet Take 30 mg by mouth daily.   Yes [provider]  Continuous Blood  Gluc Sensor (FREESTYLE LIBRE SENSOR SYSTEM) MISC Change sensor Q 2 wks 05/31/20  Yes Ladell Pier, MD  furosemide (LASIX) 80 MG tablet Take 1 tablet (80 mg total) by mouth 2 (two) times daily. 07/16/19  Yes Nita Sells, MD  gentamicin cream (GARAMYCIN) 0.1 % Apply 1 application topically every morning. Apply to exit site every day   Yes [provider]  insulin aspart (NOVOLOG) 100 UNIT/ML FlexPen Inject 6 Units into the skin 3 (three) times daily with meals. 05/20/20  Yes Ladell Pier, MD  insulin glargine (LANTUS SOLOSTAR) 100 UNIT/ML Solostar Pen Inject 20 Units into the skin daily. 05/20/20  Yes Ladell Pier, MD  Insulin Pen Needle 29G X 10MM MISC 1 each by Does not apply route in the morning, at noon, and at bedtime. 07/16/19  Yes Nita Sells, MD  lactulose (CHRONULAC) 10 GM/15ML solution Take 10 g by mouth daily as needed for mild constipation. 05/06/20  Yes [provider]  NIFEdipine (ADALAT CC) 60 MG 24 hr tablet Take 60 mg by mouth 2 (two) times daily. 09/24/19  Yes [provider]  ondansetron (ZOFRAN ODT) 4 MG disintegrating tablet Take 1 tablet (4 mg total) by mouth every 8 (eight) hours as needed for nausea or vomiting. 01/04/20  Yes Sheikh, Omair Latif, DO  paricalcitol (ZEMPLAR) 1 MCG capsule Take 3 capsules (3 mcg total) by mouth daily. 07/16/19  Yes Nita Sells, MD  rosuvastatin (CRESTOR) 40 MG tablet Take 40 mg by mouth at bedtime. 12/19/19  Yes [provider]  senna (SENOKOT) 8.6 MG tablet Take 2 tablets (17.2 mg total) by mouth daily. Patient taking differently: Take 2 tablets by mouth daily as needed for constipation. 07/16/19  Yes Nita Sells, MD  extraneal, ICODEXTRIN, peritoneal dialysis solution (ICODEXTRIN) 7.5 % As directed by nephro Patient not taking: No sig reported 07/16/19   Nita Sells, MD  Peritoneal Dialysis Solutions (DIALYSIS SOLUTION 2.5% LOW-MG/LOW-CA) 394 MOSM/L SOLN dianeal  solution As per nephro Patient not taking: No sig reported 07/16/19   Nita Sells, MD    Physical Exam: Vitals:   05/31/20 2039 05/31/20 2145 05/31/20 2315 06/01/20 0000  BP: (!) 157/98 (!) 159/90 136/77   Pulse: 87 83 83   Resp: (!) 30 (!) 21 (!) 22   Temp: 98 F (36.7 C)     TempSrc: Oral     SpO2: 97% 95% 99%   Weight:    108 kg  Height:    '5\' 10"'$  (1.778 m)    Constitutional: Looks chronically ill, otherwise NAD, calm, comfortable Eyes: PERRL, lids and conjunctivae normal ENMT: Mucous membranes are moist. Posterior pharynx clear of any exudate or lesions. Neck: normal, supple, no masses, no thyromegaly Respiratory: Clear to auscultation bilaterally, no wheezing, no crackles. Normal respiratory effort. No accessory muscle use.  Cardiovascular: Regular rate and rhythm, no murmurs / rubs / gallops. No extremity edema. 2+ pedal pulses. No carotid bruits.  Abdomen: Obese, PD catheter in place.  No distention.  Bowel sounds positive.  Soft, no tenderness, no masses palpated. No hepatosplenomegaly. Bowel sounds positive.  Musculoskeletal: no clubbing / cyanosis. Good ROM, no contractures. Normal muscle tone.  Skin: no rashes, lesions, ulcers on limited dermatological examination. Neurologic: CN 2-12 grossly intact. Sensation intact, DTR normal. Strength 5/5 in all 4.  Psychiatric: Normal judgment and insight. Alert and oriented x 3. Normal mood.   Labs on Admission: I have personally reviewed following labs and imaging studies  CBC: Recent Labs  Lab 05/31/20 1802  WBC 4.9  HGB 10.3*  HCT 30.4*  MCV 90.7  PLT 0000000    Basic Metabolic Panel: Recent Labs  Lab 05/31/20 1802  NA 131*  K 3.5  CL 93*  CO2 24  GLUCOSE 140*  BUN 77*  CREATININE 17.38*  CALCIUM 8.2*    GFR: Estimated Creatinine Clearance: 6.4 mL/min (A) (by C-G formula based on SCr of 17.38 mg/dL (H)).  Liver Function Tests: No results for input(s): AST, ALT, ALKPHOS, BILITOT, PROT, ALBUMIN in the  last 168 hours.  Urine analysis: No results found for: COLORURINE, APPEARANCEUR, Adams, [place], GLUCOSEU, HGBUR, BILIRUBINUR, KETONESUR, PROTEINUR, UROBILINOGEN, NITRITE, LEUKOCYTESUR  Radiological Exams on Admission: DG Chest 2 View  Result Date: 05/31/2020 CLINICAL DATA:  Chest pain EXAM: CHEST - 2 VIEW COMPARISON:  July 14, 2019 FINDINGS: There is scarring in the left base. Lungs elsewhere are clear. Heart is borderline enlarged with pulmonary vascularity normal. Patient is status post median sternotomy. There is a stent in the left subclavian-axillary junction region. No bone lesions. No pneumothorax. IMPRESSION: Scarring left base. Lungs elsewhere clear. Borderline cardiac enlargement. Postoperative changes noted. Electronically Signed   By: Lowella Grip III M.D.   On: 05/31/2020 19:01   VAS US DUPLEX DIALYSIS ACCESS (AVF,AVG)  Result Date: 05/31/2020 DIALYSIS ACCESS Reason for Exam: Lt shoulder pian. Access Site: Left Upper Extremity. Access Type: Brachial-cephalic AVF. History: AVF not been used for 2 years. Performing Technologist: Concha Norway RVT  Examination Guidelines: A complete evaluation includes B-mode imaging, spectral Doppler, color Doppler, and power Doppler as needed of all accessible portions of each vessel. Unilateral testing is considered an integral part of a complete examination. Limited examinations for reoccurring indications may be performed as noted.  Findings: +--------------------+----------+-----------------+--------+ AVF                 PSV (cm/s)Flow Vol (mL/min)Comments +--------------------+----------+-----------------+--------+ Native artery inflow   153           966                +--------------------+----------+-----------------+--------+ AVF Anastomosis        229                              +--------------------+----------+-----------------+--------+  +---------------+----------+-------------+----------+--------+ OUTFLOW VEIN   PSV  (cm/s)Diameter (cm)Depth (cm)Describe +---------------+----------+-------------+----------+--------+ Subclavian vein   336                                    +---------------+----------+-------------+----------+--------+ Confluence        425                                    +---------------+----------+-------------+----------+--------+ Prox UA           101        0.73                        +---------------+----------+-------------+----------+--------+  Mid UA             57        0.95                        +---------------+----------+-------------+----------+--------+ Dist UA           199        1.05                        +---------------+----------+-------------+----------+--------+  +---------------+------------+----------+---------+--------+------------------+                  Diameter  Depth (cm)Branching  PSV      Flow Volume                        (cm)                        (cm/s)      (ml/min)      +---------------+------------+----------+---------+--------+------------------+ Left Rad Art                                     76                      dis                                                                      +---------------+------------+----------+---------+--------+------------------+ antegrade                                                                +---------------+------------+----------+---------+--------+------------------+  Summary: Patent left AVF with increased velocites at the confluence and decreased flow volume. This AVF has not been used for two years.  *See table(s) above for measurements and observations.  Diagnosing physician: Hortencia Pilar MD Electronically signed by Hortencia Pilar MD on 05/31/2020 at 5:06:23 PM.    --------------------------------------------------------------------------------   Final    07/16/2019 echo  IMPRESSIONS   1. Left ventricular ejection fraction, by  estimation, is 60 to 65%. The  left ventricle has normal function. The left ventricle has no regional  wall motion abnormalities. There is mild left ventricular hypertrophy.  Left ventricular diastolic parameters  are consistent with Grade I diastolic dysfunction (impaired relaxation).  2. Right ventricular systolic function is mildly reduced. The right  ventricular size is normal. Tricuspid regurgitation signal is inadequate  for assessing PA pressure.  3. The mitral valve is grossly normal. Trivial mitral valve  regurgitation.  4. The aortic valve is tricuspid. Aortic valve regurgitation is not  visualized.  5. The inferior vena cava is dilated in size with <50% respiratory  variability, suggesting right atrial pressure of 15 mmHg.    EKG: Independently reviewed.  Vent. rate 85 BPM PR interval 180 ms QRS duration 106 ms QT/QTc 446/530 ms P-R-T axes 60 72 -80 Sinus rhythm  with Premature atrial complexes with Abberant conduction ST & T wave abnormality, consider inferolateral ischemia Prolonged QT Abnormal ECG  Assessment/Plan Principal Problem:   Acute coronary syndrome with high troponin (HCC) Observation/progressive unit. Supplemental oxygen as needed. Sublingual nitroglycerin as needed. Continue heparin infusion. Continue carvedilol 6.25 mg p.o. twice daily. Continue nifedipine 60 mg p.o. twice daily. Serial EKGs. Trend troponin level. Obtain echocardiogram. Cardiology input appreciated.  Active Problems:   Type 2 diabetes mellitus with peripheral neuropathy (HCC) Carbohydrate modified diet. CBG monitoring with RI SS. Continue Lantus 20 units daily.    Essential hypertension Continue carvedilol 6.25 mg p.o. daily. Continue nifedipine 60 mg p.o. twice daily. Also on furosemide 80 mg p.o. 2 times daily. Monitor BP, heart rate, renal function electrolytes.    ESRD on peritoneal dialysis [facility]) Consult nephrology in the morning. Continue Sensipar 30 mg p.o.  daily. Continue calcium acetate 3 times daily with meals. Continue paricalcitol 3 mcg p.o. daily. PD orders per nephrology.    HLD (hyperlipidemia) Continue rosuvastatin 40 mg p.o. daily.    Normocytic anemia Secondary to ESRD. Erythropoietin per nephrology. Monitor H&H.     DVT prophylaxis: On heparin infusion. Code Status:   Full code. Family Communication: Disposition Plan:   Patient is from:  Home.  Anticipated DC to:  Home.  Anticipated DC date:  06/02/2020.  Anticipated DC barriers: Clinical status.  Consults called:  Cardiology (Dr. Paticia Stack). Admission status:  Observation/progressive unit.  Severity of Illness: High due to presenting with ACS with elevated troponin. The patient will need to remain in the hospital for treatment with heparin infusion and further work-up.  Reubin Milan MD [facility]  How to contact the [facility] Attending or Consulting provider [place] or covering provider during after hours [place], for this patient?   1. Check the care team in [facility] and look for a) attending/consulting [facility] provider listed and b) the [facility] team listed 2. Log into [email or web address] and use [place]'s universal password to access. If you do not have the password, please contact the hospital operator. 3. Locate the [facility] provider you are looking for under [facility] and page to a number that you can be directly reached. 4. If you still have difficulty reaching the provider, please page the [facility] (Director on Call) for the Hospitalists listed on amion for assistance.  06/01/2020, 12:36 AM   This document was prepared using Dragon voice recognition software and may contain some unintended transcription errors.

## 2020-06-01 NOTE — Progress Notes (Signed)
ANTICOAGULATION CONSULT NOTE - Follow Up Consult  Pharmacy Consult for heparin Indication: chest pain/ACS  Allergies  Allergen Reactions   Lisinopril Hives   Other Swelling    Other reaction(s): EGGPLANT    Patient Measurements: Height: '5\' 10"'$  (177.8 cm) Weight: 108 kg (238 lb 1.6 oz) IBW/kg (Calculated) : 73 Heparin Dosing Weight: 95 kg  Vital Signs: Temp: 98.5 F (36.9 C) (02/22 1352) Temp Source: Oral (02/22 1352) BP: 150/73 (02/22 2000) Pulse Rate: 85 (02/22 2000)  Labs: Recent Labs    05/31/20 1802 05/31/20 2045 06/01/20 0923 06/01/20 1131 06/01/20 2134  HGB 10.3*  --   --   --   --   HCT 30.4*  --   --   --   --   PLT 249  --   --   --   --   HEPARINUNFRC  --   --  0.24*  --  0.29*  CREATININE 17.38*  --   --   --   --   TROPONINIHS 65* 219*  --  598*  --     Estimated Creatinine Clearance: 6.4 mL/min (A) (by C-G formula based on SCr of 17.38 mg/dL (H)).   Medical History: Past Medical History:  Diagnosis Date   Diabetes mellitus without complication ([place])    History of vitrectomy 2014   anterior, left   Hx of heart bypass surgery    Hypertension    Renal disorder    dialysis     Assessment: 50 yr old male with ESRD (on PD) and hx of CABG presented with sudden onset of intense CP; troponin elevated and rising (65>>598). Pharmacy was consulted to dose heparin for ACS. Cardiology monitoring; planning for 48 hrs of IV heparin.  Heparin level ~10 hrs after increasing heparin infusion to 1450 units/hr was 0.29 units/ml, which is just below the goal range for this pt. H/H 10.3/30.4, plt 249. Per RN, no issues with IV or bleeding observed.  Goal of Therapy:  Heparin level 0.3-0.7 units/ml Monitor platelets by anticoagulation protocol: Yes   Plan:  Increase heparin to 1550 units/hr Check 8-hr heparin level Monitor daily heparin level, CBC Monitor for bleeding Planning heparin X 48 hrs per discussion with Dr. Acie Fredrickson - stop date  entered  Gillermina Hu, PharmD, BCPS, [facility] Clinical Pharmacist 06/01/2020 10:14 PM

## 2020-06-01 NOTE — ED Notes (Signed)
MD paged x2 regarding diet order

## 2020-06-01 NOTE — Progress Notes (Signed)
Progress Note  Patient Name: Donald [name] Date of Encounter: 06/01/2020  [facility] Cardiologist:  Angelena Form  Subjective   50 year old gentleman with a history of coronary artery disease-status post CABG in 2016 in [place], [place].  He has end-stage renal disease and is on peritoneal dialysis.  He also has type 2 diabetes mellitus complicated by peripheral arterial disease and left great toe amputation.  He has a history of hypertension, anemia, GI bleed, hyperlipidemia.  He presented to the hospital with episodes of chest discomfort that has been present for the past several months  Troponin so far are mildly elevated at 219. Echocardiogram is in progress.  Pre lim images show mild LV dysfunction - EF 45-50%.  With inferior hypokinesis.   RV enlargement  Currently not having any pain.    Inpatient Medications    Scheduled Meds: . [START ON 06/02/2020] aspirin EC  81 mg Oral Daily  . calcium acetate  1,334 mg Oral TID WC  . carvedilol  6.25 mg Oral BID WC  . cinacalcet  30 mg Oral Daily  . furosemide  80 mg Oral BID  . gentamicin cream  1 application Topical q morning  . NIFEdipine  60 mg Oral BID  . paricalcitol  3 mcg Oral Daily  . rosuvastatin  40 mg Oral QHS   Continuous Infusions: . heparin 1,300 Units/hr (06/01/20 0133)   PRN Meds: acetaminophen **OR** acetaminophen, lactulose, nitroGLYCERIN, prochlorperazine, senna   Vital Signs    Vitals:   06/01/20 0535 06/01/20 0545 06/01/20 0600 06/01/20 0631  BP: (!) 170/88 (!) 173/96 (!) 163/99 (!) 168/96  Pulse: 92 89 87 86  Resp: '19 18 20 19  '$ Temp:      TempSrc:      SpO2: 92% 92% 92% 91%  Weight:      Height:       No intake or output data in the 24 hours ending 06/01/20 0901 Last 3 Weights 06/01/2020 05/31/2020 05/20/2020  Weight (lbs) 238 lb 1.6 oz 238 lb 240 lb  Weight (kg) 108 kg 107.956 kg 108.863 kg      Telemetry    NSR  - Personally Reviewed  ECG    NSR with diffuse ST depression laterally  -  Personally Reviewed  Physical Exam    GEN:  middle age man,  NAD , moderately obese   Neck: No JVD Cardiac: RRR, no murmurs, rubs, or gallops.  Respiratory: Clear to auscultation bilaterally. GI: Soft, nontender, non-distended  MS: No edema; No deformity. Neuro:  Nonfocal  Psych: Normal affect   Labs    High Sensitivity Troponin:   Recent Labs  Lab 05/31/20 1802 05/31/20 2045  TROPONINIHS 65* 219*      Chemistry Recent Labs  Lab 05/31/20 1802  NA 131*  K 3.5  CL 93*  CO2 24  GLUCOSE 140*  BUN 77*  CREATININE 17.38*  CALCIUM 8.2*  GFRNONAA 3*  ANIONGAP 14     Hematology Recent Labs  Lab 05/31/20 1802  WBC 4.9  RBC 3.35*  HGB 10.3*  HCT 30.4*  MCV 90.7  MCH 30.7  MCHC 33.9  RDW 16.7*  PLT 249    BNPNo results for input(s): BNP, PROBNP in the last 168 hours.   DDimer No results for input(s): DDIMER in the last 168 hours.   Radiology    DG Chest 2 View  Result Date: 05/31/2020 CLINICAL DATA:  Chest pain EXAM: CHEST - 2 VIEW COMPARISON:  July 14, 2019 FINDINGS: There  is scarring in the left base. Lungs elsewhere are clear. Heart is borderline enlarged with pulmonary vascularity normal. Patient is status post median sternotomy. There is a stent in the left subclavian-axillary junction region. No bone lesions. No pneumothorax. IMPRESSION: Scarring left base. Lungs elsewhere clear. Borderline cardiac enlargement. Postoperative changes noted. Electronically Signed   By: Lowella Grip III M.D.   On: 05/31/2020 19:01   VAS US DUPLEX DIALYSIS ACCESS (AVF,AVG)  Result Date: 05/31/2020 DIALYSIS ACCESS Reason for Exam: Lt shoulder pian. Access Site: Left Upper Extremity. Access Type: Brachial-cephalic AVF. History: AVF not been used for 2 years. Performing Technologist: Concha Norway RVT  Examination Guidelines: A complete evaluation includes B-mode imaging, spectral Doppler, color Doppler, and power Doppler as needed of all accessible portions of each vessel.  Unilateral testing is considered an integral part of a complete examination. Limited examinations for reoccurring indications may be performed as noted.  Findings: +--------------------+----------+-----------------+--------+ AVF                 PSV (cm/s)Flow Vol (mL/min)Comments +--------------------+----------+-----------------+--------+ Native artery inflow   153           966                +--------------------+----------+-----------------+--------+ AVF Anastomosis        229                              +--------------------+----------+-----------------+--------+  +---------------+----------+-------------+----------+--------+ OUTFLOW VEIN   PSV (cm/s)Diameter (cm)Depth (cm)Describe +---------------+----------+-------------+----------+--------+ Subclavian vein   336                                    +---------------+----------+-------------+----------+--------+ Confluence        425                                    +---------------+----------+-------------+----------+--------+ Prox UA           101        0.73                        +---------------+----------+-------------+----------+--------+ Mid UA             57        0.95                        +---------------+----------+-------------+----------+--------+ Dist UA           199        1.05                        +---------------+----------+-------------+----------+--------+  +---------------+------------+----------+---------+--------+------------------+                  Diameter  Depth (cm)Branching  PSV      Flow Volume                        (cm)                        (cm/s)      (ml/min)      +---------------+------------+----------+---------+--------+------------------+ Left Rad Art  76                      dis                                                                       +---------------+------------+----------+---------+--------+------------------+ antegrade                                                                +---------------+------------+----------+---------+--------+------------------+  Summary: Patent left AVF with increased velocites at the confluence and decreased flow volume. This AVF has not been used for two years.  *See table(s) above for measurements and observations.  Diagnosing physician: Hortencia Pilar MD Electronically signed by Hortencia Pilar MD on 05/31/2020 at 5:06:23 PM.    --------------------------------------------------------------------------------   Final     Cardiac Studies      Patient Profile     50 y.o. male with hx of CAD , CABG, ESRD  Assessment & Plan    1.  CAD : Patient presents with some episodes of chest discomfort.  These are somewhat atypical.  They have been present for the past several months.  His EKG shows a left ventricular systolic function that is perhaps mildly depressed/lower limits of normal.  We will continue to draw enzymes.  Continue to treat his blood pressure.    2.  Hypertension: Blood pressure is moderately elevated. Increase coreg        For questions or updates, please contact [place] Please consult [email or web address] for contact info under        Signed, Mertie Moores, MD  06/01/2020, 9:01 AM

## 2020-06-01 NOTE — Progress Notes (Signed)
ANTICOAGULATION CONSULT NOTE - Initial Consult  Pharmacy Consult for heparin Indication: chest pain/ACS  Allergies  Allergen Reactions  . Lisinopril Hives  . Other Swelling    Other reaction(s): EGGPLANT    Patient Measurements: Height: '5\' 10"'$  (177.8 cm) Weight: 108 kg (238 lb 1.6 oz) IBW/kg (Calculated) : 73 Heparin Dosing Weight: 95kg  Vital Signs: Temp: 98 F (36.7 C) (02/21 2039) Temp Source: Oral (02/21 2039) BP: 136/77 (02/21 2315) Pulse Rate: 83 (02/21 2315)  Labs: Recent Labs    05/31/20 1802 05/31/20 2045  HGB 10.3*  --   HCT 30.4*  --   PLT 249  --   CREATININE 17.38*  --   TROPONINIHS 65* 219*    Estimated Creatinine Clearance: 6.4 mL/min (A) (by C-G formula based on SCr of 17.38 mg/dL (H)).   Medical History: Past Medical History:  Diagnosis Date  . Diabetes mellitus without complication ([place])   . History of vitrectomy 2014   anterior, left  . Hx of heart bypass surgery   . Hypertension   . Renal disorder    dialysis     Assessment: 50yo male w/ h/o CABG c/o sudden onset of intense CP, troponin elevated and rising, to begin heparin.  Goal of Therapy:  Heparin level 0.3-0.7 units/ml Monitor platelets by anticoagulation protocol: Yes   Plan:  Will give heparin 4000 units IV bolus x1 followed by gtt at 1300 units/hr and monitor heparin levels and CBC.  Wynona Neat, PharmD, BCPS  06/01/2020,12:09 AM

## 2020-06-01 NOTE — ED Notes (Signed)
Lunch Tray Ordered @ 1055. 

## 2020-06-01 NOTE — ED Notes (Signed)
MD paged x3 regarding diet order

## 2020-06-01 NOTE — Progress Notes (Signed)
PROGRESS NOTE    BENJERMAN [name]  [id number] DOB: [birth year] DOA: 05/31/2020 PCP: Ladell Pier, MD   Brief Narrative:  Donald [name] is a 50 y.o. male with medical history significant of type 2 diabetes, history of vitrectomy, CAD, CABG, hypertension, hyperlipidemia, ESRD on peritoneal dialysis who is coming to the emergency department with complaints of sharp precordial chest pain radiated to his left arm associated with dyspnea that happened while he was washing his car.  In ED: Vital sign: WNL, received aspirin and heparin infusion.  Initial troponin 65 trended up to 219, EKG shows nonspecific ST-T wave changes and prolonged QT interval.  Cardiology consulted and patient admitted for further evaluation and management for chest pain.  Assessment & Plan:   Non-STEMI: -Patient presented with chest pain.  Initial troponin 65 trended up to 219.  Reviewed EKG. -Patient started on heparin infusion in ED -Evaluated by cardiology-recommend trend troponin and increase his Coreg dose due to elevated blood pressure.  Appreciate cardiology's recommendation -Continue aspirin, Coreg, nitro as needed, Procardia, Crestor -Repeat echo is pending  Hypertension: Uncontrolled -Increase Coreg dose to 12.5 mg twice daily per cardiology, continue Procardia.  Monitor blood pressure closely  Type 2 diabetes mellitus with peripheral neuropathy: -Continue sliding scale insulin and Lantus 20 units daily.  Added sliding scale insulin.  Check A1c.  Monitor blood sugar closely  Chronic diastolic CHF: -Patient appears euvolemic on exam.  On room air.  Reviewed chest x-ray.  Previous echo from 4/7 shows ejection Vigen of 60 to 65% with grade 1 diastolic dysfunction, mild LVH -Continue Lasix 80 mg p.o. twice daily.  Strict INO's and daily weight. -Repeat echo is pending  ESRD on peritoneal dialysis -We will consult nephrology  Anemia of chronic disease: -Likely in the setting of ESRD.  H&H is stable.   Continue to monitor  Hyperlipidemia: Continue Crestor  Hyponatremia: Chronic: Sodium 131.  Likely in the setting of Lasix.  Continue to monitor  Obesity with BMI of 34: -Diet modification/exercise and weight loss recommended  DVT prophylaxis: Heparin Code Status: Full code Family Communication:  None present at bedside.  Plan of care discussed with patient in length and he verbalized understanding and agreed with it. Disposition Plan: Home in 1 to 2 days  Consultants:   Cardiology  Nephrology  Procedures:   None  Antimicrobials:   None  Status is: Observation   Dispo: The patient is from: Home              Anticipated d/c is to: Home              Anticipated d/c date is: 1 day              Patient currently is not medically stable to d/c.   Difficult to place patient No     Subjective: Patient seen and examined in ED.  Tells me that his chest pain has improved.  No shortness of breath, leg swelling, orthopnea or PND.  He is on peritoneal dialysis at home.  Denies nausea, vomiting, abdominal pain, fever or chills.  Denies smoking, alcohol however uses marijuana occasionally.  Objective: Vitals:   06/01/20 0545 06/01/20 0600 06/01/20 0631 06/01/20 1022  BP: (!) 173/96 (!) 163/99 (!) 168/96 (!) 158/91  Pulse: 89 87 86   Resp: '18 20 19   '$ Temp:      TempSrc:      SpO2: 92% 92% 91%   Weight:      Height:  No intake or output data in the 24 hours ending 06/01/20 1033 Filed Weights   06/01/20 0000  Weight: 108 kg    Examination:  General exam: Appears calm and comfortable, obese, on room air, communicating well Respiratory system: Clear to auscultation. Respiratory effort normal. Cardiovascular system: S1 & S2 heard, RRR. No JVD, murmurs, rubs, gallops or clicks. No pedal edema. Gastrointestinal system: Abdomen is nondistended, soft and nontender. No organomegaly or masses felt. Normal bowel sounds heard.  Peritoneal dialysis catheter noted on the  exam. Central nervous system: Alert and oriented. No focal neurological deficits. Extremities: Symmetric 5 x 5 power. Skin: No rashes, lesions or ulcers Psychiatry: Judgement and insight appear normal. Mood & affect appropriate.    Data Reviewed: I have personally reviewed following labs and imaging studies  CBC: Recent Labs  Lab 05/31/20 1802  WBC 4.9  HGB 10.3*  HCT 30.4*  MCV 90.7  PLT 0000000   Basic Metabolic Panel: Recent Labs  Lab 05/31/20 1802  NA 131*  K 3.5  CL 93*  CO2 24  GLUCOSE 140*  BUN 77*  CREATININE 17.38*  CALCIUM 8.2*   GFR: Estimated Creatinine Clearance: 6.4 mL/min (A) (by C-G formula based on SCr of 17.38 mg/dL (H)). Liver Function Tests: No results for input(s): AST, ALT, ALKPHOS, BILITOT, PROT, ALBUMIN in the last 168 hours. No results for input(s): LIPASE, AMYLASE in the last 168 hours. No results for input(s): AMMONIA in the last 168 hours. Coagulation Profile: No results for input(s): INR, PROTIME in the last 168 hours. Cardiac Enzymes: No results for input(s): CKTOTAL, CKMB, CKMBINDEX, TROPONINI in the last 168 hours. BNP (last 3 results) No results for input(s): PROBNP in the last 8760 hours. HbA1C: No results for input(s): HGBA1C in the last 72 hours. CBG: Recent Labs  Lab 06/01/20 1017  GLUCAP 83   Lipid Profile: No results for input(s): CHOL, HDL, LDLCALC, TRIG, CHOLHDL, LDLDIRECT in the last 72 hours. Thyroid Function Tests: No results for input(s): TSH, T4TOTAL, FREET4, T3FREE, THYROIDAB in the last 72 hours. Anemia Panel: No results for input(s): VITAMINB12, FOLATE, FERRITIN, TIBC, IRON, RETICCTPCT in the last 72 hours. Sepsis Labs: No results for input(s): PROCALCITON, LATICACIDVEN in the last 168 hours.  Recent Results (from the past 240 hour(s))  SARS CORONAVIRUS 2 (TAT 6-24 HRS) Nasopharyngeal Nasopharyngeal Swab     Status: None   Collection Time: 06/01/20 12:24 AM   Specimen: Nasopharyngeal Swab  Result Value Ref  Range Status   SARS Coronavirus 2 NEGATIVE NEGATIVE Final    Comment: (NOTE) SARS-CoV-2 target nucleic acids are NOT DETECTED.  The SARS-CoV-2 RNA is generally detectable in upper and lower respiratory specimens during the acute phase of infection. Negative results do not preclude SARS-CoV-2 infection, do not rule out co-infections with other pathogens, and should not be used as the sole basis for treatment or other patient management decisions. Negative results must be combined with clinical observations, patient history, and epidemiological information. The expected result is Negative.  Fact Sheet for Patients: SugarRoll.be  Fact Sheet for Healthcare Providers: https://www.woods-mathews.com/  This test is not yet approved or cleared by the [place] FDA and  has been authorized for detection and/or diagnosis of SARS-CoV-2 by FDA under an Emergency Use Authorization (EUA). This EUA will remain  in effect (meaning this test can be used) for the duration of the COVID-19 declaration under Se ction 564(b)(1) of the Act, 21 U.S.C. section 360bbb-3(b)(1), unless the authorization is terminated or revoked sooner.  Performed at  [facility], [place] [address] [place], [place] [address]       Radiology Studies: DG Chest 2 View  Result Date: 05/31/2020 CLINICAL DATA:  Chest pain EXAM: CHEST - 2 VIEW COMPARISON:  July 14, 2019 FINDINGS: There is scarring in the left base. Lungs elsewhere are clear. Heart is borderline enlarged with pulmonary vascularity normal. Patient is status post median sternotomy. There is a stent in the left subclavian-axillary junction region. No bone lesions. No pneumothorax. IMPRESSION: Scarring left base. Lungs elsewhere clear. Borderline cardiac enlargement. Postoperative changes noted. Electronically Signed   By: Lowella Grip III M.D.   On: 05/31/2020 19:01   VAS US DUPLEX DIALYSIS ACCESS  (AVF,AVG)  Result Date: 05/31/2020 DIALYSIS ACCESS Reason for Exam: Lt shoulder pian. Access Site: Left Upper Extremity. Access Type: Brachial-cephalic AVF. History: AVF not been used for 2 years. Performing Technologist: Concha Norway RVT  Examination Guidelines: A complete evaluation includes B-mode imaging, spectral Doppler, color Doppler, and power Doppler as needed of all accessible portions of each vessel. Unilateral testing is considered an integral part of a complete examination. Limited examinations for reoccurring indications may be performed as noted.  Findings: +--------------------+----------+-----------------+--------+ AVF                 PSV (cm/s)Flow Vol (mL/min)Comments +--------------------+----------+-----------------+--------+ Native artery inflow   153           966                +--------------------+----------+-----------------+--------+ AVF Anastomosis        229                              +--------------------+----------+-----------------+--------+  +---------------+----------+-------------+----------+--------+ OUTFLOW VEIN   PSV (cm/s)Diameter (cm)Depth (cm)Describe +---------------+----------+-------------+----------+--------+ Subclavian vein   336                                    +---------------+----------+-------------+----------+--------+ Confluence        425                                    +---------------+----------+-------------+----------+--------+ Prox UA           101        0.73                        +---------------+----------+-------------+----------+--------+ Mid UA             57        0.95                        +---------------+----------+-------------+----------+--------+ Dist UA           199        1.05                        +---------------+----------+-------------+----------+--------+  +---------------+------------+----------+---------+--------+------------------+                  Diameter  Depth  (cm)Branching  PSV      Flow Volume                        (cm)                        (  cm/s)      (ml/min)      +---------------+------------+----------+---------+--------+------------------+ Left Rad Art                                     76                      dis                                                                      +---------------+------------+----------+---------+--------+------------------+ antegrade                                                                +---------------+------------+----------+---------+--------+------------------+  Summary: Patent left AVF with increased velocites at the confluence and decreased flow volume. This AVF has not been used for two years.  *See table(s) above for measurements and observations.  Diagnosing physician: Hortencia Pilar MD Electronically signed by Hortencia Pilar MD on 05/31/2020 at 5:06:23 PM.    --------------------------------------------------------------------------------   Final     Scheduled Meds: . [START ON 06/02/2020] aspirin EC  81 mg Oral Daily  . calcium acetate  1,334 mg Oral TID WC  . carvedilol  12.5 mg Oral BID WC  . cinacalcet  30 mg Oral Daily  . furosemide  80 mg Oral BID  . gentamicin cream  1 application Topical q morning  . NIFEdipine  60 mg Oral BID  . paricalcitol  3 mcg Oral Daily  . rosuvastatin  40 mg Oral QHS   Continuous Infusions: . heparin 1,300 Units/hr (06/01/20 0133)     LOS: 0 days   Time spent: 35 minutes   Maygen Sirico Loann Quill, MD [facility]  If 7PM-7AM, please contact night-coverage [email or web address] 06/01/2020, 10:33 AM

## 2020-06-02 DIAGNOSIS — I5043 Acute on chronic combined systolic (congestive) and diastolic (congestive) heart failure: Secondary | ICD-10-CM

## 2020-06-02 LAB — CBG MONITORING, ED
Glucose-Capillary: 113 mg/dL — ABNORMAL HIGH (ref 70–99)
Glucose-Capillary: 120 mg/dL — ABNORMAL HIGH (ref 70–99)
Glucose-Capillary: 127 mg/dL — ABNORMAL HIGH (ref 70–99)
Glucose-Capillary: 142 mg/dL — ABNORMAL HIGH (ref 70–99)

## 2020-06-02 LAB — RENAL FUNCTION PANEL
Albumin: 2.8 g/dL — ABNORMAL LOW (ref 3.5–5.0)
Anion gap: 18 — ABNORMAL HIGH (ref 5–15)
BUN: 88 mg/dL — ABNORMAL HIGH (ref 6–20)
CO2: 20 mmol/L — ABNORMAL LOW (ref 22–32)
Calcium: 8 mg/dL — ABNORMAL LOW (ref 8.9–10.3)
Chloride: 95 mmol/L — ABNORMAL LOW (ref 98–111)
Creatinine, Ser: 19.21 mg/dL — ABNORMAL HIGH (ref 0.61–1.24)
GFR, Estimated: 3 mL/min — ABNORMAL LOW (ref >60)
Glucose, Bld: 99 mg/dL (ref 70–99)
Phosphorus: 7.1 mg/dL — ABNORMAL HIGH (ref 2.5–4.6)
Potassium: 3.8 mmol/L (ref 3.5–5.1)
Sodium: 133 mmol/L — ABNORMAL LOW (ref 135–145)

## 2020-06-02 LAB — GLUCOSE, CAPILLARY
Glucose-Capillary: 133 mg/dL — ABNORMAL HIGH (ref 70–99)
Glucose-Capillary: 167 mg/dL — ABNORMAL HIGH (ref 70–99)

## 2020-06-02 LAB — MAGNESIUM: Magnesium: 2 mg/dL (ref 1.7–2.4)

## 2020-06-02 LAB — CBC
HCT: 25.9 % — ABNORMAL LOW (ref 39.0–52.0)
Hemoglobin: 9.1 g/dL — ABNORMAL LOW (ref 13.0–17.0)
MCH: 31.8 pg (ref 26.0–34.0)
MCHC: 35.1 g/dL (ref 30.0–36.0)
MCV: 90.6 fL (ref 80.0–100.0)
Platelets: 204 10*3/uL (ref 150–400)
RBC: 2.86 MIL/uL — ABNORMAL LOW (ref 4.22–5.81)
RDW: 16.8 % — ABNORMAL HIGH (ref 11.5–15.5)
WBC: 5.9 10*3/uL (ref 4.0–10.5)
nRBC: 0 % (ref 0.0–0.2)

## 2020-06-02 LAB — HEPARIN LEVEL (UNFRACTIONATED)
Heparin Unfractionated: >2.2 IU/mL — ABNORMAL HIGH (ref 0.30–0.70)
Heparin Unfractionated: 0.32 IU/mL (ref 0.30–0.70)

## 2020-06-02 MED ORDER — HEPARIN 1000 UNIT/ML FOR PERITONEAL DIALYSIS: 500.0000 [IU] | INTRAMUSCULAR | Status: DC | PRN

## 2020-06-02 MED ORDER — SODIUM CHLORIDE 0.9 % IV SOLN: 250.0000 mL | INTRAVENOUS | Status: DC | PRN

## 2020-06-02 MED ORDER — GENTAMICIN SULFATE 0.1 % EX CREA
1.0000 "application " | TOPICAL_CREAM | Freq: Every day | CUTANEOUS | Status: DC
  Administered 2020-06-02: 1 via TOPICAL
  Filled 2020-06-02: qty 15

## 2020-06-02 MED ORDER — SODIUM CHLORIDE 0.9% FLUSH: 3.0000 mL | INTRAVENOUS | Status: DC | PRN

## 2020-06-02 MED ORDER — HEPARIN 1000 UNIT/ML FOR PERITONEAL DIALYSIS
INTRAPERITONEAL | Status: DC | PRN
  Filled 2020-06-02 (×2): qty 5000

## 2020-06-02 MED ORDER — SODIUM CHLORIDE 0.9% FLUSH
3.0000 mL | Freq: Two times a day (BID) | INTRAVENOUS | Status: DC
  Administered 2020-06-02: 3 mL via INTRAVENOUS

## 2020-06-02 MED ORDER — DELFLEX-LC/2.5% DEXTROSE 394 MOSM/L IP SOLN
INTRAPERITONEAL | Status: DC
  Administered 2020-06-02: 5000 mL via INTRAPERITONEAL

## 2020-06-02 MED ORDER — SODIUM CHLORIDE 0.9 % IV SOLN: INTRAVENOUS | Status: DC

## 2020-06-02 MED ORDER — ASPIRIN 81 MG PO CHEW
81.0000 mg | CHEWABLE_TABLET | ORAL | Status: AC
Start: 2020-06-03 — End: 2020-06-03
  Administered 2020-06-03: 81 mg via ORAL
  Filled 2020-06-02: qty 1

## 2020-06-02 MED ORDER — ROSUVASTATIN CALCIUM 5 MG PO TABS
10.0000 mg | ORAL_TABLET | Freq: Every day | ORAL | Status: DC
Start: 2020-06-02 — End: 2020-06-03
  Administered 2020-06-02: 10 mg via ORAL
  Filled 2020-06-02: qty 2

## 2020-06-02 NOTE — ED Notes (Signed)
SDU Breakfast Order Placed

## 2020-06-02 NOTE — Progress Notes (Signed)
[facility] Progress Note   Subjective:     [name] was seen and examined at bedside today. He is still in the ER awaiting bed on the floor. He denies CP, SOB, ABD pain, and N/V/D. Once bed is available. PD will be initiated.   Objective Vitals:   06/02/20 1300 06/02/20 1400 06/02/20 1431 06/02/20 1455  BP: (!) 144/77 (!) 143/72 (!) 144/70 137/78  Pulse: 74 73 72 74  Resp: 20 (!) '21 17 18  '$ Temp:   98 F (36.7 C) 97.6 F (36.4 C)  TempSrc:   Oral Oral  SpO2: 97% 94% 98% 98%  Weight:    110.3 kg  Height:       Physical Exam General: Appears comfortable; No acute respiratory distress Head: NCAT sclera not icteric  Lungs: On RA; CTA bilaterally. No wheeze, rales or rhonchi. Breathing is unlabored. Heart: S1 and S2; No murmur or friction rub Abdomen: soft, nontender, +BS, no guarding, no rebound tenderness Lower extremities: no edema upper/lower extremities, bilateral hips Neuro: AAOx3. Moves all extremities spontaneously. Psych:  Responds to questions appropriately with a normal affect. Dialysis Access: RUQ PD Catheter  Filed Weights   06/01/20 0000 06/02/20 1455  Weight: 108 kg 110.3 kg    Intake/Output Summary (Last 24 hours) at 06/02/2020 1508 Last data filed at 06/01/2020 1610 Gross per 24 hour  Intake 159.59 ml  Output -  Net 159.59 ml    Additional Objective Labs: Basic Metabolic Panel: Recent Labs  Lab 05/31/20 1802 06/02/20 0208  NA 131* 133*  K 3.5 3.8  CL 93* 95*  CO2 24 20*  GLUCOSE 140* 99  BUN 77* 88*  CREATININE 17.38* 19.21*  CALCIUM 8.2* 8.0*  PHOS  --  7.1*   Liver Function Tests: Recent Labs  Lab 06/02/20 0208  ALBUMIN 2.8*   No results for input(s): LIPASE, AMYLASE in the last 168 hours. CBC: Recent Labs  Lab 05/31/20 1802 06/02/20 0208  WBC 4.9 5.9  HGB 10.3* 9.1*  HCT 30.4* 25.9*  MCV 90.7 90.6  PLT 249 204   Blood Culture No results found for: SDES, SPECREQUEST, CULT, REPTSTATUS  Cardiac Enzymes: No  results for input(s): CKTOTAL, CKMB, CKMBINDEX, TROPONINI in the last 168 hours. CBG: Recent Labs  Lab 06/01/20 1751 06/02/20 0003 06/02/20 0732 06/02/20 0754 06/02/20 1250  GLUCAP 93 120* 127* 113* 142*   Iron Studies: No results for input(s): IRON, TIBC, TRANSFERRIN, FERRITIN in the last 72 hours. Lab Results  Component Value Date   INR 1.2 12/31/2019   INR 1.1 07/14/2019   Studies/Results: DG Chest 2 View  Result Date: 05/31/2020 CLINICAL DATA:  Chest pain EXAM: CHEST - 2 VIEW COMPARISON:  July 14, 2019 FINDINGS: There is scarring in the left base. Lungs elsewhere are clear. Heart is borderline enlarged with pulmonary vascularity normal. Patient is status post median sternotomy. There is a stent in the left subclavian-axillary junction region. No bone lesions. No pneumothorax. IMPRESSION: Scarring left base. Lungs elsewhere clear. Borderline cardiac enlargement. Postoperative changes noted. Electronically Signed   By: Lowella Grip III M.D.   On: 05/31/2020 19:01   ECHOCARDIOGRAM COMPLETE  Result Date: 06/01/2020    ECHOCARDIOGRAM REPORT   Patient Name:   Donald [name] Date of Exam: 06/01/2020 Medical Rec #:  [id number]      Height:       70.0 in Accession #:    [id number]     Weight:       238.1 lb Date  of Birth:  Oct 21, 1970      BSA:          2.248 m Patient Age:    50 years       BP:           181/82 mmHg Patient Gender: M              HR:           89 bpm. Exam Location:  Inpatient Procedure: 2D Echo, Cardiac Doppler and Color Doppler Indications:    R07.9* Chest pain, unspecified  History:        Patient has prior history of Echocardiogram examinations, most                 recent 07/16/2019. Risk Factors:Hypertension and Diabetes. Renal                 Disorder.  Sonographer:    Jonelle Sidle Dance Referring Phys: [id number] [place]  1. Left ventricular ejection fraction, by estimation, is 35 to 40%. The left ventricle has moderately decreased function. The left  ventricle demonstrates regional wall motion abnormalities (see scoring diagram/findings for description). The left ventricular internal cavity size was mildly dilated. There is moderate left ventricular hypertrophy. Left ventricular diastolic parameters are consistent with Grade I diastolic dysfunction (impaired relaxation). There is moderate hypokinesis of the left ventricular, entire inferior wall.  2. Right ventricular systolic function is moderately reduced. The right ventricular size is not well visualized.  3. Left atrial size was moderately dilated.  4. Right atrial size was mildly dilated.  5. The mitral valve is normal in structure. Trivial mitral valve regurgitation. No evidence of mitral stenosis.  6. The aortic valve is tricuspid. There is mild calcification of the aortic valve. There is mild thickening of the aortic valve. Aortic valve regurgitation is not visualized. Mild aortic valve sclerosis is present, with no evidence of aortic valve stenosis.  7. The inferior vena cava is normal in size with greater than 50% respiratory variability, suggesting right atrial pressure of 3 mmHg. Comparison(s): Changes from prior study are noted. Conclusion(s)/Recommendation(s): EF now reduced. There is global hypokinesis, with more prominent moderate/severe hypokinesis in entire inferior wall. RV size/free wall not well visualized. FINDINGS  Left Ventricle: Left ventricular ejection fraction, by estimation, is 35 to 40%. The left ventricle has moderately decreased function. The left ventricle demonstrates regional wall motion abnormalities. Moderate hypokinesis of the left ventricular, entire inferior wall. The left ventricular internal cavity size was mildly dilated. There is moderate left ventricular hypertrophy. Left ventricular diastolic parameters are consistent with Grade I diastolic dysfunction (impaired relaxation). Right Ventricle: The right ventricular size is not well visualized. Right vetricular wall  thickness was not well visualized. Right ventricular systolic function is moderately reduced. Left Atrium: Left atrial size was moderately dilated. Right Atrium: Right atrial size was mildly dilated. Pericardium: There is no evidence of pericardial effusion. Mitral Valve: The mitral valve is normal in structure. There is mild calcification of the mitral valve leaflet(s). Trivial mitral valve regurgitation. No evidence of mitral valve stenosis. Tricuspid Valve: The tricuspid valve is normal in structure. Tricuspid valve regurgitation is trivial. No evidence of tricuspid stenosis. Aortic Valve: Focal nodular calcification on RCC. The aortic valve is tricuspid. There is mild calcification of the aortic valve. There is mild thickening of the aortic valve. Aortic valve regurgitation is not visualized. Mild aortic valve sclerosis is present, with no evidence of aortic valve stenosis. Pulmonic Valve: The pulmonic valve  was not well visualized. Pulmonic valve regurgitation is not visualized. No evidence of pulmonic stenosis. Aorta: The aortic root, ascending aorta, aortic arch and descending aorta are all structurally normal, with no evidence of dilitation or obstruction. Venous: The inferior vena cava is normal in size with greater than 50% respiratory variability, suggesting right atrial pressure of 3 mmHg. IAS/Shunts: The atrial septum is grossly normal. Additional Comments: There is a small pleural effusion in both left and right lateral regions.  LEFT VENTRICLE PLAX 2D LVIDd:         5.40 cm  Diastology LVIDs:         4.30 cm  LV e' medial:    4.03 cm/s LV PW:         1.60 cm  LV E/e' medial:  14.6 LV IVS:        1.30 cm  LV e' lateral:   7.36 cm/s LVOT diam:     1.90 cm  LV E/e' lateral: 8.0 LV SV:         46 LV SV Index:   21 LVOT Area:     2.84 cm  RIGHT VENTRICLE            IVC RV Basal diam:  2.80 cm    IVC diam: 1.90 cm RV S prime:     6.48 cm/s TAPSE (M-mode): 1.3 cm LEFT ATRIUM             Index       RIGHT  ATRIUM           Index LA diam:        4.70 cm 2.09 cm/m  RA Area:     17.70 cm LA Vol (A2C):   90.7 ml 40.35 ml/m RA Volume:   46.90 ml  20.86 ml/m LA Vol (A4C):   68.4 ml 30.43 ml/m LA Biplane Vol: 81.3 ml 36.17 ml/m  AORTIC VALVE LVOT Vmax:   87.70 cm/s LVOT Vmean:  52.600 cm/s LVOT VTI:    0.163 m  AORTA Ao Root diam: 3.20 cm Ao Asc diam:  3.10 cm MITRAL VALVE MV Area (PHT): 3.03 cm    SHUNTS MV Decel Time: 250 msec    Systemic VTI:  0.16 m MV E velocity: 58.80 cm/s  Systemic Diam: 1.90 cm MV A velocity: 94.10 cm/s MV E/A ratio:  0.62 Buford Dresser MD Electronically signed by Buford Dresser MD Signature Date/Time: 06/01/2020/11:09:32 AM    Final     Medications: . sodium chloride    . [START ON 06/03/2020] sodium chloride    . dialysis solution 2.5% low-MG/low-CA    . heparin 1,600 Units/hr (06/02/20 0946)   . [START ON 06/03/2020] aspirin  81 mg Oral Pre-Cath  . aspirin EC  81 mg Oral Daily  . calcium acetate  1,334 mg Oral TID WC  . carvedilol  12.5 mg Oral BID WC  . cinacalcet  30 mg Oral Daily  . furosemide  80 mg Oral BID  . gentamicin cream  1 application Topical Daily  . insulin aspart  0-15 Units Subcutaneous TID WC  . insulin aspart  0-5 Units Subcutaneous QHS  . insulin glargine  20 Units Subcutaneous Daily  . NIFEdipine  60 mg Oral BID  . paricalcitol  3 mcg Oral Daily  . rosuvastatin  10 mg Oral QHS  . sodium chloride flush  3 mL Intravenous Q12H    Dialysis Orders: PD Number of fills: 5 exchanges Fill volume: 2900 Total volume: 16000 EDW:  107.5kg Fill Time: 80mn Dwell Time: 1.5hrs Drain time: 26m Of note: Day dwell/pauses will not be performed during this hospitalization.  Assessment/Plan: 1.  NSTEMI: Per previous notes, troponin trending upward. Continuous Heparin infusion. Cardiology consulted. Patient scheduled for cardiac catheterization tomorrow 06/03/20. 2.  ESRD -  On PD at home. Patient did not receive PD yesterday 06/01/20 d/t not  having a bed. Once patient is assigned to floor bed, PD will be initiated. PD orders placed today 06/02/20. 3.  Hypertension/volume  - BP trend reviewed. Bps improving. Continue Carvedilol, Lasix, and Nifedipine. 4.  Anemia of CKD - Hgb 9.1;  Will check CBC in AM. May have to resume Fe/ESA; will follow-up with outpatient dialysis for verification in ESA dosage. 5.  Secondary Hyperparathyroidism - Ca 8.2; Will order renal function panel in AM. Continue Sensipar and Paricalcitol.  6.  Nutrition - Renal diet with fluid restriction; Will order albumin level.  CoTobie PoetNP [facility] 06/02/2020,3:08 PM  LOS: 1 day

## 2020-06-02 NOTE — Progress Notes (Signed)
[facility] for heparin Indication: chest pain/ACS  Allergies  Allergen Reactions  . Lisinopril Hives  . Other Swelling    Other reaction(s): EGGPLANT    Patient Measurements: Height: '5\' 10"'$  (177.8 cm) Weight: 108 kg (238 lb 1.6 oz) IBW/kg (Calculated) : 73 Heparin Dosing Weight: 95kg  Vital Signs: Temp: 99 F (37.2 C) (02/23 0800) Temp Source: Oral (02/23 0800) BP: 126/75 (02/23 0800) Pulse Rate: 76 (02/23 0800)  Labs: Recent Labs    05/31/20 1802 05/31/20 2045 06/01/20 0923 06/01/20 1131 06/01/20 2134 06/02/20 0208 06/02/20 0404 06/02/20 0753  HGB 10.3*  --   --   --   --  9.1*  --   --   HCT 30.4*  --   --   --   --  25.9*  --   --   PLT 249  --   --   --   --  204  --   --   HEPARINUNFRC  --   --    < >  --  0.29*  --  >2.20* 0.32  CREATININE 17.38*  --   --   --   --  19.21*  --   --   TROPONINIHS 65* 219*  --  598*  --   --   --   --    < > = values in this interval not displayed.    Estimated Creatinine Clearance: 5.8 mL/min (A) (by C-G formula based on SCr of 19.21 mg/dL (H)).   Medical History: Past Medical History:  Diagnosis Date  . Diabetes mellitus without complication ([place])   . History of vitrectomy 2014   anterior, left  . Hx of heart bypass surgery   . Hypertension   . Renal disorder    dialysis     Assessment: 50yo male with ESRD on CCPD, h/o CABG c/o sudden onset of intense CP, troponin elevated and rising, to begin heparin. Cardiology monitoring for now.  Heparin level therapeutic at bottom of range at 0.32 this AM (level redrawn due to initial lab error). Hg down to 9.1, plt wnl. No bleeding or issues with infusion per discussion with RN.  Goal of Therapy:  Heparin level 0.3-0.7 units/ml Monitor platelets by anticoagulation protocol: Yes   Plan:  Increase heparin slightly to 1600 units/hr to ensure stays in range Monitor daily heparin level/CBC, s/sx bleeding Planning heparin x 48hrs per  discussion with Dr. Acie Fredrickson - stop date entered for 2/24 at [place], PharmD, BCPS Please check AMION for all [place] contact numbers Clinical Pharmacist 06/02/2020 9:20 AM

## 2020-06-02 NOTE — Progress Notes (Signed)
Progress Note  Patient Name: Donald [name] Date of Encounter: 06/02/2020  [facility] Cardiologist: Angelena Form   Subjective    50 yo with hx of CAD, CABG, ESRD Admitted with ACS + troponin   we have scheduled him for a cath tomorrow He has a dialysis graft in left upper arm .      Inpatient Medications    Scheduled Meds: . aspirin EC  81 mg Oral Daily  . calcium acetate  1,334 mg Oral TID WC  . carvedilol  12.5 mg Oral BID WC  . cinacalcet  30 mg Oral Daily  . furosemide  80 mg Oral BID  . gentamicin cream  1 application Topical Daily  . insulin aspart  0-15 Units Subcutaneous TID WC  . insulin aspart  0-5 Units Subcutaneous QHS  . insulin glargine  20 Units Subcutaneous Daily  . NIFEdipine  60 mg Oral BID  . paricalcitol  3 mcg Oral Daily  . rosuvastatin  10 mg Oral QHS   Continuous Infusions: . dialysis solution 2.5% low-MG/low-CA Stopped (06/01/20 1600)  . heparin 1,550 Units/hr (06/01/20 2355)   PRN Meds: acetaminophen **OR** acetaminophen, dianeal solution for CAPD/CCPD with heparin, lactulose, nitroGLYCERIN, prochlorperazine, senna   Vital Signs    Vitals:   06/02/20 0600 06/02/20 0700 06/02/20 0745 06/02/20 0800  BP: 125/75 127/75  126/75  Pulse:   76 76  Resp:   (!) 24 17  Temp:    99 F (37.2 C)  TempSrc:    Oral  SpO2:    98%  Weight:      Height:        Intake/Output Summary (Last 24 hours) at 06/02/2020 1011 Last data filed at 06/01/2020 1610 Gross per 24 hour  Intake 159.59 ml  Output --  Net 159.59 ml   Last 3 Weights 06/01/2020 05/31/2020 05/20/2020  Weight (lbs) 238 lb 1.6 oz 238 lb 240 lb  Weight (kg) 108 kg 107.956 kg 108.863 kg      Telemetry    NSR - Personally Reviewed  ECG     nsr  - Personally Reviewed  Physical Exam    GEN: No acute distress.   Neck: No JVD Cardiac: RRR, no murmurs, rubs, or gallops.  Respiratory: Clear to auscultation bilaterally. GI: Soft, nontender, non-distended  MS: No edema; No  deformity. Neuro:  Nonfocal  Psych: Normal affect   Labs    High Sensitivity Troponin:   Recent Labs  Lab 05/31/20 1802 05/31/20 2045 06/01/20 1131  TROPONINIHS 65* 219* 598*      Chemistry Recent Labs  Lab 05/31/20 1802 06/02/20 0208  NA 131* 133*  K 3.5 3.8  CL 93* 95*  CO2 24 20*  GLUCOSE 140* 99  BUN 77* 88*  CREATININE 17.38* 19.21*  CALCIUM 8.2* 8.0*  ALBUMIN  --  2.8*  GFRNONAA 3* 3*  ANIONGAP 14 18*     Hematology Recent Labs  Lab 05/31/20 1802 06/02/20 0208  WBC 4.9 5.9  RBC 3.35* 2.86*  HGB 10.3* 9.1*  HCT 30.4* 25.9*  MCV 90.7 90.6  MCH 30.7 31.8  MCHC 33.9 35.1  RDW 16.7* 16.8*  PLT 249 204    BNPNo results for input(s): BNP, PROBNP in the last 168 hours.   DDimer No results for input(s): DDIMER in the last 168 hours.   Radiology    DG Chest 2 View  Result Date: 05/31/2020 CLINICAL DATA:  Chest pain EXAM: CHEST - 2 VIEW COMPARISON:  July 14, 2019  FINDINGS: There is scarring in the left base. Lungs elsewhere are clear. Heart is borderline enlarged with pulmonary vascularity normal. Patient is status post median sternotomy. There is a stent in the left subclavian-axillary junction region. No bone lesions. No pneumothorax. IMPRESSION: Scarring left base. Lungs elsewhere clear. Borderline cardiac enlargement. Postoperative changes noted. Electronically Signed   By: Lowella Grip III M.D.   On: 05/31/2020 19:01   VAS US DUPLEX DIALYSIS ACCESS (AVF,AVG)  Result Date: 05/31/2020 DIALYSIS ACCESS Reason for Exam: Lt shoulder pian. Access Site: Left Upper Extremity. Access Type: Brachial-cephalic AVF. History: AVF not been used for 2 years. Performing Technologist: Concha Norway RVT  Examination Guidelines: A complete evaluation includes B-mode imaging, spectral Doppler, color Doppler, and power Doppler as needed of all accessible portions of each vessel. Unilateral testing is considered an integral part of a complete examination. Limited examinations  for reoccurring indications may be performed as noted.  Findings: +--------------------+----------+-----------------+--------+ AVF                 PSV (cm/s)Flow Vol (mL/min)Comments +--------------------+----------+-----------------+--------+ Native artery inflow   153           966                +--------------------+----------+-----------------+--------+ AVF Anastomosis        229                              +--------------------+----------+-----------------+--------+  +---------------+----------+-------------+----------+--------+ OUTFLOW VEIN   PSV (cm/s)Diameter (cm)Depth (cm)Describe +---------------+----------+-------------+----------+--------+ Subclavian vein   336                                    +---------------+----------+-------------+----------+--------+ Confluence        425                                    +---------------+----------+-------------+----------+--------+ Prox UA           101        0.73                        +---------------+----------+-------------+----------+--------+ Mid UA             57        0.95                        +---------------+----------+-------------+----------+--------+ Dist UA           199        1.05                        +---------------+----------+-------------+----------+--------+  +---------------+------------+----------+---------+--------+------------------+                  Diameter  Depth (cm)Branching  PSV      Flow Volume                        (cm)                        (cm/s)      (ml/min)      +---------------+------------+----------+---------+--------+------------------+ Left Rad Art  76                      dis                                                                      +---------------+------------+----------+---------+--------+------------------+ antegrade                                                                 +---------------+------------+----------+---------+--------+------------------+  Summary: Patent left AVF with increased velocites at the confluence and decreased flow volume. This AVF has not been used for two years.  *See table(s) above for measurements and observations.  Diagnosing physician: Hortencia Pilar MD Electronically signed by Hortencia Pilar MD on 05/31/2020 at 5:06:23 PM.    --------------------------------------------------------------------------------   Final    ECHOCARDIOGRAM COMPLETE  Result Date: 06/01/2020    ECHOCARDIOGRAM REPORT   Patient Name:   HILERY WALDBILLIG Date of Exam: 06/01/2020 Medical Rec #:  [id number]      Height:       70.0 in Accession #:    [id number]     Weight:       238.1 lb Date of Birth:  [birth year]      BSA:          2.248 m Patient Age:    80 years       BP:           181/82 mmHg Patient Gender: M              HR:           89 bpm. Exam Location:  Inpatient Procedure: 2D Echo, Cardiac Doppler and Color Doppler Indications:    R07.9* Chest pain, unspecified  History:        Patient has prior history of Echocardiogram examinations, most                 recent 07/16/2019. Risk Factors:Hypertension and Diabetes. Renal                 Disorder.  Sonographer:    Jonelle Sidle Dance Referring Phys: [id number] [place]  1. Left ventricular ejection fraction, by estimation, is 35 to 40%. The left ventricle has moderately decreased function. The left ventricle demonstrates regional wall motion abnormalities (see scoring diagram/findings for description). The left ventricular internal cavity size was mildly dilated. There is moderate left ventricular hypertrophy. Left ventricular diastolic parameters are consistent with Grade I diastolic dysfunction (impaired relaxation). There is moderate hypokinesis of the left ventricular, entire inferior wall.  2. Right ventricular systolic function is moderately reduced. The right ventricular size is not well visualized.  3. Left  atrial size was moderately dilated.  4. Right atrial size was mildly dilated.  5. The mitral valve is normal in structure. Trivial mitral valve regurgitation. No evidence of mitral stenosis.  6. The aortic valve is tricuspid. There is mild calcification of the aortic valve. There is mild thickening of the aortic valve. Aortic valve regurgitation is not visualized.  Mild aortic valve sclerosis is present, with no evidence of aortic valve stenosis.  7. The inferior vena cava is normal in size with greater than 50% respiratory variability, suggesting right atrial pressure of 3 mmHg. Comparison(s): Changes from prior study are noted. Conclusion(s)/Recommendation(s): EF now reduced. There is global hypokinesis, with more prominent moderate/severe hypokinesis in entire inferior wall. RV size/free wall not well visualized. FINDINGS  Left Ventricle: Left ventricular ejection fraction, by estimation, is 35 to 40%. The left ventricle has moderately decreased function. The left ventricle demonstrates regional wall motion abnormalities. Moderate hypokinesis of the left ventricular, entire inferior wall. The left ventricular internal cavity size was mildly dilated. There is moderate left ventricular hypertrophy. Left ventricular diastolic parameters are consistent with Grade I diastolic dysfunction (impaired relaxation). Right Ventricle: The right ventricular size is not well visualized. Right vetricular wall thickness was not well visualized. Right ventricular systolic function is moderately reduced. Left Atrium: Left atrial size was moderately dilated. Right Atrium: Right atrial size was mildly dilated. Pericardium: There is no evidence of pericardial effusion. Mitral Valve: The mitral valve is normal in structure. There is mild calcification of the mitral valve leaflet(s). Trivial mitral valve regurgitation. No evidence of mitral valve stenosis. Tricuspid Valve: The tricuspid valve is normal in structure. Tricuspid valve  regurgitation is trivial. No evidence of tricuspid stenosis. Aortic Valve: Focal nodular calcification on RCC. The aortic valve is tricuspid. There is mild calcification of the aortic valve. There is mild thickening of the aortic valve. Aortic valve regurgitation is not visualized. Mild aortic valve sclerosis is present, with no evidence of aortic valve stenosis. Pulmonic Valve: The pulmonic valve was not well visualized. Pulmonic valve regurgitation is not visualized. No evidence of pulmonic stenosis. Aorta: The aortic root, ascending aorta, aortic arch and descending aorta are all structurally normal, with no evidence of dilitation or obstruction. Venous: The inferior vena cava is normal in size with greater than 50% respiratory variability, suggesting right atrial pressure of 3 mmHg. IAS/Shunts: The atrial septum is grossly normal. Additional Comments: There is a small pleural effusion in both left and right lateral regions.  LEFT VENTRICLE PLAX 2D LVIDd:         5.40 cm  Diastology LVIDs:         4.30 cm  LV e' medial:    4.03 cm/s LV PW:         1.60 cm  LV E/e' medial:  14.6 LV IVS:        1.30 cm  LV e' lateral:   7.36 cm/s LVOT diam:     1.90 cm  LV E/e' lateral: 8.0 LV SV:         46 LV SV Index:   21 LVOT Area:     2.84 cm  RIGHT VENTRICLE            IVC RV Basal diam:  2.80 cm    IVC diam: 1.90 cm RV S prime:     6.48 cm/s TAPSE (M-mode): 1.3 cm LEFT ATRIUM             Index       RIGHT ATRIUM           Index LA diam:        4.70 cm 2.09 cm/m  RA Area:     17.70 cm LA Vol (A2C):   90.7 ml 40.35 ml/m RA Volume:   46.90 ml  20.86 ml/m LA Vol (A4C):   68.4 ml 30.43 ml/m LA  Biplane Vol: 81.3 ml 36.17 ml/m  AORTIC VALVE LVOT Vmax:   87.70 cm/s LVOT Vmean:  52.600 cm/s LVOT VTI:    0.163 m  AORTA Ao Root diam: 3.20 cm Ao Asc diam:  3.10 cm MITRAL VALVE MV Area (PHT): 3.03 cm    SHUNTS MV Decel Time: 250 msec    Systemic VTI:  0.16 m MV E velocity: 58.80 cm/s  Systemic Diam: 1.90 cm MV A velocity: 94.10  cm/s MV E/A ratio:  0.62 Buford Dresser MD Electronically signed by Buford Dresser MD Signature Date/Time: 06/01/2020/11:09:32 AM    Final     Cardiac Studies      Patient Profile     50 y.o. male with CAD , ESRD   Assessment & Plan    1.   CAD :     NSTEMI.   Has CABG in 2016.  Will schedule for cath tomorrow. I have discussed risks, benefits, options. He understands and agrees to proceed.   2.   ESRD:   Gets peritoneal dialysis.  He needs to have hid PD fluid exchanged  3.   Acute on chronic combined CHF:   EF is 35-40%.   For cath tomorrow        For questions or updates, please contact [place] Please consult [email or web address] for contact info under        Signed, Mertie Moores, MD  06/02/2020, 10:11 AM

## 2020-06-02 NOTE — Progress Notes (Signed)
PROGRESS NOTE    Donald [name]  [id number] DOB: February 17, 1971 DOA: 05/31/2020 PCP: Ladell Pier, MD   Brief Narrative: 50 year old with past medical history significant for diabetes type 2, history of vitrectomy, CAD, CABG, hypertension, hyperlipidemia, ESRD on peritoneal dialysis who is coming to the emergency department complaining of sharp precordial chest pain radiated to his left arm associated with shortness of breath happened while he was washing his car.  Initial troponin 65 trending up to 219, EKG showed nonspecific ST-T wave changes and prolonged QT.  Cardiology was consulted and plan is for heart cath on 12/24    Assessment & Plan:   Principal Problem:   Acute coronary syndrome with high troponin (HCC) Active Problems:   Chest pain   Type 2 diabetes mellitus with peripheral neuropathy (HCC)   Essential hypertension   ESRD on peritoneal dialysis (HCC)   HLD (hyperlipidemia)   Normocytic anemia   Prolonged QT interval  1-non-STEMI: Patient presented with chest pain, elevation of troponin 65--219---599. Continue with heparin drip Continue with aspirin, Coreg, nitro as needed, Procardia, Crestor.   2-Hypertension: Continue with Coreg, Procardia  3-Diabetes type 2 with peripheral neuropathy: Continue with Lantus and sliding scale insulin.  4-Chronic diastolic heart failure: -Continue with lasix.   ESRD on peritoneal dialysis: On lasix.  Appreciate nephrology follow up.   Anemia of chronic disease Hb 10--9.1  Hyperlipidemia: Continue with Crestor  Hyponatremia: In setting of renal failure.  Asymptomatic.   Obesity  with a BMI of 34      Estimated body mass index is 34.88 kg/m as calculated from the following:   Height as of this encounter: '5\' 10"'$  (1.778 m).   Weight as of this encounter: 110.3 kg.   DVT prophylaxis: heparin gtt Code Status: full code Family Communication: care discussed with patient.  Disposition Plan:  Status is:  Inpatient  Remains inpatient appropriate because:IV treatments appropriate due to intensity of illness or inability to take PO   Dispo: The patient is from: Home              Anticipated d/c is to: Home              Anticipated d/c date is: 2 days              Patient currently is not medically stable to d/c.   Difficult to place patient No        Consultants:   Cardiology   Procedures:     Antimicrobials:    Subjective: He is chest pain free.  He had chest pain yesterday while washing his car.   Objective: Vitals:   06/02/20 1300 06/02/20 1400 06/02/20 1431 06/02/20 1455  BP: (!) 144/77 (!) 143/72 (!) 144/70 137/78  Pulse: 74 73 72 74  Resp: 20 (!) '21 17 18  '$ Temp:   98 F (36.7 C) 97.6 F (36.4 C)  TempSrc:   Oral Oral  SpO2: 97% 94% 98% 98%  Weight:    110.3 kg  Height:        Intake/Output Summary (Last 24 hours) at 06/02/2020 1519 Last data filed at 06/01/2020 1610 Gross per 24 hour  Intake 159.59 ml  Output --  Net 159.59 ml   Filed Weights   06/01/20 0000 06/02/20 1455  Weight: 108 kg 110.3 kg    Examination:  General exam: Appears calm and comfortable  Respiratory system: Clear to auscultation. Respiratory effort normal. Cardiovascular system: S1 & S2 heard, RRR. No JVD, murmurs, rubs,  gallops or clicks. No pedal edema. Gastrointestinal system: Abdomen is nondistended, soft and nontender. No organomegaly or masses felt. Normal bowel sounds heard. Central nervous system: Alert and oriented.  Extremities: Symmetric 5 x 5 power.    Data Reviewed: I have personally reviewed following labs and imaging studies  CBC: Recent Labs  Lab 05/31/20 1802 06/02/20 0208  WBC 4.9 5.9  HGB 10.3* 9.1*  HCT 30.4* 25.9*  MCV 90.7 90.6  PLT 249 0000000   Basic Metabolic Panel: Recent Labs  Lab 05/31/20 1802 06/01/20 0923 06/02/20 0208  NA 131*  --  133*  K 3.5  --  3.8  CL 93*  --  95*  CO2 24  --  20*  GLUCOSE 140*  --  99  BUN 77*  --  88*   CREATININE 17.38*  --  19.21*  CALCIUM 8.2*  --  8.0*  MG  --  2.1 2.0  PHOS  --   --  7.1*   GFR: Estimated Creatinine Clearance: 5.8 mL/min (A) (by C-G formula based on SCr of 19.21 mg/dL (H)). Liver Function Tests: Recent Labs  Lab 06/02/20 0208  ALBUMIN 2.8*   No results for input(s): LIPASE, AMYLASE in the last 168 hours. No results for input(s): AMMONIA in the last 168 hours. Coagulation Profile: No results for input(s): INR, PROTIME in the last 168 hours. Cardiac Enzymes: No results for input(s): CKTOTAL, CKMB, CKMBINDEX, TROPONINI in the last 168 hours. BNP (last 3 results) No results for input(s): PROBNP in the last 8760 hours. HbA1C: Recent Labs    06/01/20 1043  HGBA1C 6.4*   CBG: Recent Labs  Lab 06/01/20 1751 06/02/20 0003 06/02/20 0732 06/02/20 0754 06/02/20 1250  GLUCAP 93 120* 127* 113* 142*   Lipid Profile: No results for input(s): CHOL, HDL, LDLCALC, TRIG, CHOLHDL, LDLDIRECT in the last 72 hours. Thyroid Function Tests: No results for input(s): TSH, T4TOTAL, FREET4, T3FREE, THYROIDAB in the last 72 hours. Anemia Panel: No results for input(s): VITAMINB12, FOLATE, FERRITIN, TIBC, IRON, RETICCTPCT in the last 72 hours. Sepsis Labs: No results for input(s): PROCALCITON, LATICACIDVEN in the last 168 hours.  Recent Results (from the past 240 hour(s))  SARS CORONAVIRUS 2 (TAT 6-24 HRS) Nasopharyngeal Nasopharyngeal Swab     Status: None   Collection Time: 06/01/20 12:24 AM   Specimen: Nasopharyngeal Swab  Result Value Ref Range Status   SARS Coronavirus 2 NEGATIVE NEGATIVE Final    Comment: (NOTE) SARS-CoV-2 target nucleic acids are NOT DETECTED.  The SARS-CoV-2 RNA is generally detectable in upper and lower respiratory specimens during the acute phase of infection. Negative results do not preclude SARS-CoV-2 infection, do not rule out co-infections with other pathogens, and should not be used as the sole basis for treatment or other patient  management decisions. Negative results must be combined with clinical observations, patient history, and epidemiological information. The expected result is Negative.  Fact Sheet for Patients: SugarRoll.be  Fact Sheet for Healthcare Providers: https://www.woods-mathews.com/  This test is not yet approved or cleared by the [place] FDA and  has been authorized for detection and/or diagnosis of SARS-CoV-2 by FDA under an Emergency Use Authorization (EUA). This EUA will remain  in effect (meaning this test can be used) for the duration of the COVID-19 declaration under Se ction 564(b)(1) of the Act, 21 U.S.C. section 360bbb-3(b)(1), unless the authorization is terminated or revoked sooner.  Performed at [facility], [place] [address] [place], [place] [address]  Radiology Studies: DG Chest 2 View  Result Date: 05/31/2020 CLINICAL DATA:  Chest pain EXAM: CHEST - 2 VIEW COMPARISON:  July 14, 2019 FINDINGS: There is scarring in the left base. Lungs elsewhere are clear. Heart is borderline enlarged with pulmonary vascularity normal. Patient is status post median sternotomy. There is a stent in the left subclavian-axillary junction region. No bone lesions. No pneumothorax. IMPRESSION: Scarring left base. Lungs elsewhere clear. Borderline cardiac enlargement. Postoperative changes noted. Electronically Signed   By: Lowella Grip III M.D.   On: 05/31/2020 19:01   ECHOCARDIOGRAM COMPLETE  Result Date: 06/01/2020    ECHOCARDIOGRAM REPORT   Patient Name:   JARVIN CARDONA Date of Exam: 06/01/2020 Medical Rec #:  [id number]      Height:       70.0 in Accession #:    [id number]     Weight:       238.1 lb Date of Birth:  02-29-1971      BSA:          2.248 m Patient Age:    67 years       BP:           181/82 mmHg Patient Gender: M              HR:           89 bpm. Exam Location:  Inpatient Procedure: 2D Echo, Cardiac Doppler and Color  Doppler Indications:    R07.9* Chest pain, unspecified  History:        Patient has prior history of Echocardiogram examinations, most                 recent 07/16/2019. Risk Factors:Hypertension and Diabetes. Renal                 Disorder.  Sonographer:    Jonelle Sidle Dance Referring Phys: [id number] [place]  1. Left ventricular ejection fraction, by estimation, is 35 to 40%. The left ventricle has moderately decreased function. The left ventricle demonstrates regional wall motion abnormalities (see scoring diagram/findings for description). The left ventricular internal cavity size was mildly dilated. There is moderate left ventricular hypertrophy. Left ventricular diastolic parameters are consistent with Grade I diastolic dysfunction (impaired relaxation). There is moderate hypokinesis of the left ventricular, entire inferior wall.  2. Right ventricular systolic function is moderately reduced. The right ventricular size is not well visualized.  3. Left atrial size was moderately dilated.  4. Right atrial size was mildly dilated.  5. The mitral valve is normal in structure. Trivial mitral valve regurgitation. No evidence of mitral stenosis.  6. The aortic valve is tricuspid. There is mild calcification of the aortic valve. There is mild thickening of the aortic valve. Aortic valve regurgitation is not visualized. Mild aortic valve sclerosis is present, with no evidence of aortic valve stenosis.  7. The inferior vena cava is normal in size with greater than 50% respiratory variability, suggesting right atrial pressure of 3 mmHg. Comparison(s): Changes from prior study are noted. Conclusion(s)/Recommendation(s): EF now reduced. There is global hypokinesis, with more prominent moderate/severe hypokinesis in entire inferior wall. RV size/free wall not well visualized. FINDINGS  Left Ventricle: Left ventricular ejection fraction, by estimation, is 35 to 40%. The left ventricle has moderately decreased  function. The left ventricle demonstrates regional wall motion abnormalities. Moderate hypokinesis of the left ventricular, entire inferior wall. The left ventricular internal cavity size was mildly dilated. There is moderate left ventricular hypertrophy. Left ventricular  diastolic parameters are consistent with Grade I diastolic dysfunction (impaired relaxation). Right Ventricle: The right ventricular size is not well visualized. Right vetricular wall thickness was not well visualized. Right ventricular systolic function is moderately reduced. Left Atrium: Left atrial size was moderately dilated. Right Atrium: Right atrial size was mildly dilated. Pericardium: There is no evidence of pericardial effusion. Mitral Valve: The mitral valve is normal in structure. There is mild calcification of the mitral valve leaflet(s). Trivial mitral valve regurgitation. No evidence of mitral valve stenosis. Tricuspid Valve: The tricuspid valve is normal in structure. Tricuspid valve regurgitation is trivial. No evidence of tricuspid stenosis. Aortic Valve: Focal nodular calcification on RCC. The aortic valve is tricuspid. There is mild calcification of the aortic valve. There is mild thickening of the aortic valve. Aortic valve regurgitation is not visualized. Mild aortic valve sclerosis is present, with no evidence of aortic valve stenosis. Pulmonic Valve: The pulmonic valve was not well visualized. Pulmonic valve regurgitation is not visualized. No evidence of pulmonic stenosis. Aorta: The aortic root, ascending aorta, aortic arch and descending aorta are all structurally normal, with no evidence of dilitation or obstruction. Venous: The inferior vena cava is normal in size with greater than 50% respiratory variability, suggesting right atrial pressure of 3 mmHg. IAS/Shunts: The atrial septum is grossly normal. Additional Comments: There is a small pleural effusion in both left and right lateral regions.  LEFT VENTRICLE PLAX 2D  LVIDd:         5.40 cm  Diastology LVIDs:         4.30 cm  LV e' medial:    4.03 cm/s LV PW:         1.60 cm  LV E/e' medial:  14.6 LV IVS:        1.30 cm  LV e' lateral:   7.36 cm/s LVOT diam:     1.90 cm  LV E/e' lateral: 8.0 LV SV:         46 LV SV Index:   21 LVOT Area:     2.84 cm  RIGHT VENTRICLE            IVC RV Basal diam:  2.80 cm    IVC diam: 1.90 cm RV S prime:     6.48 cm/s TAPSE (M-mode): 1.3 cm LEFT ATRIUM             Index       RIGHT ATRIUM           Index LA diam:        4.70 cm 2.09 cm/m  RA Area:     17.70 cm LA Vol (A2C):   90.7 ml 40.35 ml/m RA Volume:   46.90 ml  20.86 ml/m LA Vol (A4C):   68.4 ml 30.43 ml/m LA Biplane Vol: 81.3 ml 36.17 ml/m  AORTIC VALVE LVOT Vmax:   87.70 cm/s LVOT Vmean:  52.600 cm/s LVOT VTI:    0.163 m  AORTA Ao Root diam: 3.20 cm Ao Asc diam:  3.10 cm MITRAL VALVE MV Area (PHT): 3.03 cm    SHUNTS MV Decel Time: 250 msec    Systemic VTI:  0.16 m MV E velocity: 58.80 cm/s  Systemic Diam: 1.90 cm MV A velocity: 94.10 cm/s MV E/A ratio:  0.62 Buford Dresser MD Electronically signed by Buford Dresser MD Signature Date/Time: 06/01/2020/11:09:32 AM    Final         Scheduled Meds: . Derrill Memo ON 06/03/2020] aspirin  81 mg Oral Pre-Cath  .  aspirin EC  81 mg Oral Daily  . calcium acetate  1,334 mg Oral TID WC  . carvedilol  12.5 mg Oral BID WC  . cinacalcet  30 mg Oral Daily  . furosemide  80 mg Oral BID  . gentamicin cream  1 application Topical Daily  . insulin aspart  0-15 Units Subcutaneous TID WC  . insulin aspart  0-5 Units Subcutaneous QHS  . insulin glargine  20 Units Subcutaneous Daily  . NIFEdipine  60 mg Oral BID  . paricalcitol  3 mcg Oral Daily  . rosuvastatin  10 mg Oral QHS  . sodium chloride flush  3 mL Intravenous Q12H   Continuous Infusions: . sodium chloride    . [START ON 06/03/2020] sodium chloride    . dialysis solution 2.5% low-MG/low-CA    . heparin 1,600 Units/hr (06/02/20 0946)     LOS: 1 day    Time  spent: 35 minutes    Ezriel Boffa A Ayani Ospina, MD [facility]   If 7PM-7AM, please contact night-coverage [email or web address]  06/02/2020, 3:19 PM

## 2020-06-03 ENCOUNTER — Encounter (HOSPITAL_COMMUNITY): Payer: Self-pay | Admitting: Cardiovascular Disease

## 2020-06-03 ENCOUNTER — Encounter (HOSPITAL_COMMUNITY)
Admission: EM | Disposition: A | Discharge status: Home / Self Care | Payer: Self-pay | Source: Home / Self Care | Attending: Internal Medicine

## 2020-06-03 DIAGNOSIS — I2510 Atherosclerotic heart disease of native coronary artery without angina pectoris: Secondary | ICD-10-CM

## 2020-06-03 DIAGNOSIS — N186 End stage renal disease: Secondary | ICD-10-CM | POA: Diagnosis not present

## 2020-06-03 DIAGNOSIS — I214 Non-ST elevation (NSTEMI) myocardial infarction: Principal | ICD-10-CM

## 2020-06-03 DIAGNOSIS — Z992 Dependence on renal dialysis: Secondary | ICD-10-CM | POA: Diagnosis not present

## 2020-06-03 HISTORY — PX: LEFT HEART CATH AND CORS/GRAFTS ANGIOGRAPHY: CATH118250

## 2020-06-03 LAB — RENAL FUNCTION PANEL
Albumin: 2.9 g/dL — ABNORMAL LOW (ref 3.5–5.0)
Anion gap: 16 — ABNORMAL HIGH (ref 5–15)
BUN: 82 mg/dL — ABNORMAL HIGH (ref 6–20)
CO2: 23 mmol/L (ref 22–32)
Calcium: 8.4 mg/dL — ABNORMAL LOW (ref 8.9–10.3)
Chloride: 93 mmol/L — ABNORMAL LOW (ref 98–111)
Creatinine, Ser: 18.75 mg/dL — ABNORMAL HIGH (ref 0.61–1.24)
GFR, Estimated: 3 mL/min — ABNORMAL LOW (ref >60)
Glucose, Bld: 163 mg/dL — ABNORMAL HIGH (ref 70–99)
Phosphorus: 6.7 mg/dL — ABNORMAL HIGH (ref 2.5–4.6)
Potassium: 3.6 mmol/L (ref 3.5–5.1)
Sodium: 132 mmol/L — ABNORMAL LOW (ref 135–145)

## 2020-06-03 LAB — CBC
HCT: 26.5 % — ABNORMAL LOW (ref 39.0–52.0)
Hemoglobin: 9.1 g/dL — ABNORMAL LOW (ref 13.0–17.0)
MCH: 31.1 pg (ref 26.0–34.0)
MCHC: 34.3 g/dL (ref 30.0–36.0)
MCV: 90.4 fL (ref 80.0–100.0)
Platelets: 200 10*3/uL (ref 150–400)
RBC: 2.93 MIL/uL — ABNORMAL LOW (ref 4.22–5.81)
RDW: 16.5 % — ABNORMAL HIGH (ref 11.5–15.5)
WBC: 5.6 10*3/uL (ref 4.0–10.5)
nRBC: 0 % (ref 0.0–0.2)

## 2020-06-03 LAB — GLUCOSE, CAPILLARY
Glucose-Capillary: 122 mg/dL — ABNORMAL HIGH (ref 70–99)
Glucose-Capillary: 61 mg/dL — ABNORMAL LOW (ref 70–99)
Glucose-Capillary: 93 mg/dL (ref 70–99)

## 2020-06-03 SURGERY — LEFT HEART CATH AND CORS/GRAFTS ANGIOGRAPHY
Anesthesia: LOCAL

## 2020-06-03 MED ORDER — IOHEXOL 350 MG/ML SOLN
INTRAVENOUS | Status: AC
  Filled 2020-06-03: qty 1

## 2020-06-03 MED ORDER — CLOPIDOGREL BISULFATE 75 MG PO TABS
75.0000 mg | ORAL_TABLET | Freq: Every day | ORAL | Status: DC
  Administered 2020-06-03: 75 mg via ORAL
  Filled 2020-06-03: qty 1

## 2020-06-03 MED ORDER — CLOPIDOGREL BISULFATE 75 MG PO TABS: 75.0000 mg | ORAL_TABLET | Freq: Every day | ORAL | 3 refills | Status: DC

## 2020-06-03 MED ORDER — LIDOCAINE HCL (PF) 1 % IJ SOLN
INTRAMUSCULAR | Status: AC
  Filled 2020-06-03: qty 30

## 2020-06-03 MED ORDER — IOHEXOL 350 MG/ML SOLN
INTRAVENOUS | Status: DC | PRN
  Administered 2020-06-03: 70 mL

## 2020-06-03 MED ORDER — NITROGLYCERIN 0.4 MG SL SUBL: 0.4000 mg | SUBLINGUAL_TABLET | SUBLINGUAL | 1 refills | Status: DC | PRN

## 2020-06-03 MED ORDER — SODIUM CHLORIDE 0.9% FLUSH: 3.0000 mL | Freq: Two times a day (BID) | INTRAVENOUS | Status: DC

## 2020-06-03 MED ORDER — LABETALOL HCL 5 MG/ML IV SOLN: 10.0000 mg | INTRAVENOUS | Status: DC | PRN

## 2020-06-03 MED ORDER — HEPARIN (PORCINE) IN NACL 1000-0.9 UT/500ML-% IV SOLN
INTRAVENOUS | Status: AC
  Filled 2020-06-03: qty 1000

## 2020-06-03 MED ORDER — HEPARIN (PORCINE) IN NACL 1000-0.9 UT/500ML-% IV SOLN
INTRAVENOUS | Status: DC | PRN
  Administered 2020-06-03 (×2): 500 mL

## 2020-06-03 MED ORDER — SODIUM CHLORIDE 0.9% FLUSH: 3.0000 mL | INTRAVENOUS | Status: DC | PRN

## 2020-06-03 MED ORDER — FENTANYL CITRATE (PF) 100 MCG/2ML IJ SOLN
INTRAMUSCULAR | Status: AC
  Filled 2020-06-03: qty 2

## 2020-06-03 MED ORDER — MIDAZOLAM HCL 2 MG/2ML IJ SOLN
INTRAMUSCULAR | Status: AC
  Filled 2020-06-03: qty 2

## 2020-06-03 MED ORDER — HYDRALAZINE HCL 20 MG/ML IJ SOLN: 10.0000 mg | INTRAMUSCULAR | Status: DC | PRN

## 2020-06-03 MED ORDER — CARVEDILOL 12.5 MG PO TABS: 12.5000 mg | ORAL_TABLET | Freq: Two times a day (BID) | ORAL | 3 refills | Status: DC

## 2020-06-03 MED ORDER — SODIUM CHLORIDE 0.9 % IV SOLN: 250.0000 mL | INTRAVENOUS | Status: DC | PRN

## 2020-06-03 MED ORDER — LIDOCAINE HCL (PF) 1 % IJ SOLN
INTRAMUSCULAR | Status: DC | PRN
  Administered 2020-06-03: 15 mL

## 2020-06-03 MED ORDER — FENTANYL CITRATE (PF) 100 MCG/2ML IJ SOLN
INTRAMUSCULAR | Status: DC | PRN
  Administered 2020-06-03: 25 ug via INTRAVENOUS

## 2020-06-03 MED ORDER — MIDAZOLAM HCL 2 MG/2ML IJ SOLN
INTRAMUSCULAR | Status: DC | PRN
  Administered 2020-06-03: 1 mg via INTRAVENOUS

## 2020-06-03 SURGICAL SUPPLY — 9 items
CATH INFINITI 5FR MULTPACK ANG (CATHETERS) ×2 IMPLANT
CLOSURE MYNX CONTROL 5F (Vascular Products) ×2 IMPLANT
KIT HEART LEFT (KITS) ×2 IMPLANT
KIT MICROPUNCTURE NIT STIFF (SHEATH) ×2 IMPLANT
PACK CARDIAC CATHETERIZATION (CUSTOM PROCEDURE TRAY) ×2 IMPLANT
SHEATH PINNACLE 5F 10CM (SHEATH) ×2 IMPLANT
TRANSDUCER W/STOPCOCK (MISCELLANEOUS) ×2 IMPLANT
TUBING CIL FLEX 10 FLL-RA (TUBING) ×2 IMPLANT
WIRE EMERALD 3MM-J .035X150CM (WIRE) ×2 IMPLANT

## 2020-06-03 NOTE — Progress Notes (Signed)
Hypoglycemic Event  CBG: 61  Treatment: 8 oz juice/soda  Symptoms: None  Follow-up CBG: Time:1231 CBG Result:93  Possible Reasons for Event: Inadequate meal intake - NPO  Comments/MD notified:Yes    Cathi Roan

## 2020-06-03 NOTE — Discharge Summary (Signed)
Physician Discharge Summary  LANZ MOR [id number] DOB: November 26, 1970 DOA: 05/31/2020  PCP: Ladell Pier, MD  Admit date: 05/31/2020 Discharge date: 06/03/2020  Admitted From: Home  Disposition: Home   Recommendations for Outpatient Follow-up:  1. Follow up with PCP in 1-2 weeks 2. Please obtain BMP/CBC in one week 3. Needs follow up with cardiology for further care of CAD  Home Health: none  Discharge Condition: Stable.  CODE STATUS: Full code Diet recommendation: Heart Healthy  Brief/Interim Summary: 50 year old with past medical history significant for diabetes type 2, history of vitrectomy, CAD, CABG, hypertension, hyperlipidemia, ESRD on peritoneal dialysis who is coming to the emergency department complaining of sharp precordial chest pain radiated to his left arm associated with shortness of breath happened while he was washing his car.  Initial troponin 65 trending up to 219, EKG showed nonspecific ST-T wave changes and prolonged QT.  Cardiology was consulted and plan is for heart cath on 12/24;    1-non-STEMI: Patient presented with chest pain, elevation of troponin 65--219---599. Continue with heparin drip Continue with aspirin, Coreg, nitro as needed, Procardia, Crestor. Underwent Cath 2/24: Continued patency of the LIMA to LAD graft and saphenous vein graft to OM1 with no disease in either bypass graft. Patent SVG to right PDA with total occlusion of the PDA and PLA branches beyond the graft insertion site, some collateral filling of the PLA branches identified (suspect culprit lesion). Moderate segmental LV systolic dysfunction based on echo assessment with akinesis of the inferior wall and LVEF 35 to 40% Cardiology recommend medical management. If patient develops refractory pain might need PCI  -Started on plavix. On crestor.  -ok to discharge home per cardiology.   2-Hypertension: Continue with Coreg, Procardia  3-Diabetes type 2 with peripheral  neuropathy: Continue with Lantus and sliding scale insulin.  4-Chronic diastolic heart failure: -Continue with lasix.   ESRD on peritoneal dialysis: On lasix.  Appreciate nephrology follow up.   Anemia of chronic disease Hb 10--9.1  Hyperlipidemia: Continue with Crestor  Hyponatremia: In setting of renal failure.  Asymptomatic.   Obesity  with a BMI of 34   Discharge Diagnoses:  Principal Problem:   Acute coronary syndrome with high troponin (HCC) Active Problems:   Chest pain   Type 2 diabetes mellitus with peripheral neuropathy (HCC)   Essential hypertension   ESRD on peritoneal dialysis ([place])   HLD (hyperlipidemia)   Normocytic anemia   Prolonged QT interval    Discharge Instructions  Discharge Instructions    Diet - low sodium heart healthy   Complete by: As directed    Increase activity slowly   Complete by: As directed    No wound care   Complete by: As directed      Allergies as of 06/03/2020      Reactions   Lisinopril Hives   Other Swelling   Other reaction(s): EGGPLANT      Medication List    STOP taking these medications   extraneal (ICODEXTRIN) peritoneal dialysis solution 7.5 %     TAKE these medications   aspirin 81 MG EC tablet Take 1 tablet (81 mg total) by mouth daily.   carvedilol 12.5 MG tablet Commonly known as: COREG Take 1 tablet (12.5 mg total) by mouth 2 (two) times daily with a meal. What changed:   medication strength  how much to take   cinacalcet 30 MG tablet Commonly known as: SENSIPAR Take 30 mg by mouth daily.   clopidogrel 75 MG tablet Commonly  known as: PLAVIX Take 1 tablet (75 mg total) by mouth daily. Start taking on: June 04, 2020   dialysis solution 2.5% low-MG/low-CA 394 MOSM/L Soln dianeal solution As per nephro   FreeStyle Libre Sensor System Misc Change sensor Q 2 wks   furosemide 80 MG tablet Commonly known as: LASIX Take 1 tablet (80 mg total) by mouth 2 (two) times daily.    gentamicin cream 0.1 % Commonly known as: GARAMYCIN Apply 1 application topically every morning. Apply to exit site every day   insulin aspart 100 UNIT/ML FlexPen Commonly known as: NOVOLOG Inject 6 Units into the skin 3 (three) times daily with meals.   Insulin Pen Needle 29G X 10MM Misc 1 each by Does not apply route in the morning, at noon, and at bedtime.   lactulose 10 GM/15ML solution Commonly known as: CHRONULAC Take 10 g by mouth daily as needed for mild constipation.   Lantus SoloStar 100 UNIT/ML Solostar Pen Generic drug: insulin glargine Inject 20 Units into the skin daily.   NIFEdipine 60 MG 24 hr tablet Commonly known as: ADALAT CC Take 60 mg by mouth 2 (two) times daily.   nitroGLYCERIN 0.4 MG SL tablet Commonly known as: NITROSTAT Place 1 tablet (0.4 mg total) under the tongue every 5 (five) minutes as needed for chest pain.   ondansetron 4 MG disintegrating tablet Commonly known as: Zofran ODT Take 1 tablet (4 mg total) by mouth every 8 (eight) hours as needed for nausea or vomiting.   paricalcitol 1 MCG capsule Commonly known as: ZEMPLAR Take 3 capsules (3 mcg total) by mouth daily.   PHOSLO PO Take 4 capsules by mouth 3 (three) times daily with meals.   rosuvastatin 40 MG tablet Commonly known as: CRESTOR Take 40 mg by mouth at bedtime.   senna 8.6 MG tablet Commonly known as: SENOKOT Take 2 tablets (17.2 mg total) by mouth daily. What changed:   when to take this  reasons to take this       Follow-up Information    Donato Heinz, MD Follow up on 06/08/2020.   Specialties: Cardiology, Radiology Why: Please arrive 15 minutes early for your 2:20pm post-hospital cardiology appointment Contact information: [address] [place] [address] [phone]              Allergies  Allergen Reactions  . Lisinopril Hives  . Other Swelling    Other reaction(s): EGGPLANT    Consultations:  Cardiology     Procedures/Studies: DG Chest 2 View  Result Date: 05/31/2020 CLINICAL DATA:  Chest pain EXAM: CHEST - 2 VIEW COMPARISON:  July 14, 2019 FINDINGS: There is scarring in the left base. Lungs elsewhere are clear. Heart is borderline enlarged with pulmonary vascularity normal. Patient is status post median sternotomy. There is a stent in the left subclavian-axillary junction region. No bone lesions. No pneumothorax. IMPRESSION: Scarring left base. Lungs elsewhere clear. Borderline cardiac enlargement. Postoperative changes noted. Electronically Signed   By: Lowella Grip III M.D.   On: 05/31/2020 19:01   CARDIAC CATHETERIZATION  Result Date: 06/03/2020 1.  Severe native three-vessel coronary artery disease with patency of the left mainstem, moderate diffuse proximal and mid LAD stenosis, severe ostial left circumflex stenosis, and total occlusion of the RCA. 2.  Continued patency of the LIMA to LAD graft and saphenous vein graft to OM1 with no disease in either bypass graft 3.  Patent SVG to right PDA with total occlusion of the PDA and PLA branches  beyond the graft insertion site, some collateral filling of the PLA branches identified (suspect culprit lesion) 4.  Moderate segmental LV systolic dysfunction based on echo assessment with akinesis of the inferior wall and LVEF 35 to 40% Recommendation: Favor medical therapy.  Suspect interval occlusion just distal to the SVG to PDA graft.  The AV circumflex has severe diffuse disease that would require extensive stenting and supplies a relatively small amount of myocardium.  If refractory angina, PCI of this vessel could be considered. Add clopidogrel 75 mg daily for medical therapy of NSTEMI-ACS.   VAS US DUPLEX DIALYSIS ACCESS (AVF,AVG)  Result Date: 05/31/2020 DIALYSIS ACCESS Reason for Exam: Lt shoulder pian. Access Site: Left Upper Extremity. Access Type: Brachial-cephalic AVF. History: AVF not been used for 2 years. Performing Technologist: Concha Norway RVT  Examination Guidelines: A complete evaluation includes B-mode imaging, spectral Doppler, color Doppler, and power Doppler as needed of all accessible portions of each vessel. Unilateral testing is considered an integral part of a complete examination. Limited examinations for reoccurring indications may be performed as noted.  Findings: +--------------------+----------+-----------------+--------+ AVF                 PSV (cm/s)Flow Vol (mL/min)Comments +--------------------+----------+-----------------+--------+ Native artery inflow   153           966                +--------------------+----------+-----------------+--------+ AVF Anastomosis        229                              +--------------------+----------+-----------------+--------+  +---------------+----------+-------------+----------+--------+ OUTFLOW VEIN   PSV (cm/s)Diameter (cm)Depth (cm)Describe +---------------+----------+-------------+----------+--------+ Subclavian vein   336                                    +---------------+----------+-------------+----------+--------+ Confluence        425                                    +---------------+----------+-------------+----------+--------+ Prox UA           101        0.73                        +---------------+----------+-------------+----------+--------+ Mid UA             57        0.95                        +---------------+----------+-------------+----------+--------+ Dist UA           199        1.05                        +---------------+----------+-------------+----------+--------+  +---------------+------------+----------+---------+--------+------------------+                  Diameter  Depth (cm)Branching  PSV      Flow Volume                        (cm)                        (cm/s)      (  ml/min)      +---------------+------------+----------+---------+--------+------------------+ Left Rad Art                                      76                      dis                                                                      +---------------+------------+----------+---------+--------+------------------+ antegrade                                                                +---------------+------------+----------+---------+--------+------------------+  Summary: Patent left AVF with increased velocites at the confluence and decreased flow volume. This AVF has not been used for two years.  *See table(s) above for measurements and observations.  Diagnosing physician: Hortencia Pilar MD Electronically signed by Hortencia Pilar MD on 05/31/2020 at 5:06:23 PM.    --------------------------------------------------------------------------------   Final    ECHOCARDIOGRAM COMPLETE  Result Date: 06/01/2020    ECHOCARDIOGRAM REPORT   Patient Name:   DERRAL AUGSBURGER Date of Exam: 06/01/2020 Medical Rec #:  [id number]      Height:       70.0 in Accession #:    [id number]     Weight:       238.1 lb Date of Birth:  1970/12/02      BSA:          2.248 m Patient Age:    71 years       BP:           181/82 mmHg Patient Gender: M              HR:           89 bpm. Exam Location:  Inpatient Procedure: 2D Echo, Cardiac Doppler and Color Doppler Indications:    R07.9* Chest pain, unspecified  History:        Patient has prior history of Echocardiogram examinations, most                 recent 07/16/2019. Risk Factors:Hypertension and Diabetes. Renal                 Disorder.  Sonographer:    Jonelle Sidle Dance Referring Phys: [id number] [place]  1. Left ventricular ejection fraction, by estimation, is 35 to 40%. The left ventricle has moderately decreased function. The left ventricle demonstrates regional wall motion abnormalities (see scoring diagram/findings for description). The left ventricular internal cavity size was mildly dilated. There is moderate left ventricular hypertrophy. Left ventricular  diastolic parameters are consistent with Grade I diastolic dysfunction (impaired relaxation). There is moderate hypokinesis of the left ventricular, entire inferior wall.  2. Right ventricular systolic function is moderately reduced. The right ventricular size is not well visualized.  3. Left atrial size was moderately dilated.  4. Right atrial size was mildly dilated.  5. The mitral valve is normal in structure. Trivial mitral valve regurgitation. No evidence of mitral stenosis.  6. The aortic valve is tricuspid. There is mild calcification of the aortic valve. There is mild thickening of the aortic valve. Aortic valve regurgitation is not visualized. Mild aortic valve sclerosis is present, with no evidence of aortic valve stenosis.  7. The inferior vena cava is normal in size with greater than 50% respiratory variability, suggesting right atrial pressure of 3 mmHg. Comparison(s): Changes from prior study are noted. Conclusion(s)/Recommendation(s): EF now reduced. There is global hypokinesis, with more prominent moderate/severe hypokinesis in entire inferior wall. RV size/free wall not well visualized. FINDINGS  Left Ventricle: Left ventricular ejection fraction, by estimation, is 35 to 40%. The left ventricle has moderately decreased function. The left ventricle demonstrates regional wall motion abnormalities. Moderate hypokinesis of the left ventricular, entire inferior wall. The left ventricular internal cavity size was mildly dilated. There is moderate left ventricular hypertrophy. Left ventricular diastolic parameters are consistent with Grade I diastolic dysfunction (impaired relaxation). Right Ventricle: The right ventricular size is not well visualized. Right vetricular wall thickness was not well visualized. Right ventricular systolic function is moderately reduced. Left Atrium: Left atrial size was moderately dilated. Right Atrium: Right atrial size was mildly dilated. Pericardium: There is no evidence of  pericardial effusion. Mitral Valve: The mitral valve is normal in structure. There is mild calcification of the mitral valve leaflet(s). Trivial mitral valve regurgitation. No evidence of mitral valve stenosis. Tricuspid Valve: The tricuspid valve is normal in structure. Tricuspid valve regurgitation is trivial. No evidence of tricuspid stenosis. Aortic Valve: Focal nodular calcification on RCC. The aortic valve is tricuspid. There is mild calcification of the aortic valve. There is mild thickening of the aortic valve. Aortic valve regurgitation is not visualized. Mild aortic valve sclerosis is present, with no evidence of aortic valve stenosis. Pulmonic Valve: The pulmonic valve was not well visualized. Pulmonic valve regurgitation is not visualized. No evidence of pulmonic stenosis. Aorta: The aortic root, ascending aorta, aortic arch and descending aorta are all structurally normal, with no evidence of dilitation or obstruction. Venous: The inferior vena cava is normal in size with greater than 50% respiratory variability, suggesting right atrial pressure of 3 mmHg. IAS/Shunts: The atrial septum is grossly normal. Additional Comments: There is a small pleural effusion in both left and right lateral regions.  LEFT VENTRICLE PLAX 2D LVIDd:         5.40 cm  Diastology LVIDs:         4.30 cm  LV e' medial:    4.03 cm/s LV PW:         1.60 cm  LV E/e' medial:  14.6 LV IVS:        1.30 cm  LV e' lateral:   7.36 cm/s LVOT diam:     1.90 cm  LV E/e' lateral: 8.0 LV SV:         46 LV SV Index:   21 LVOT Area:     2.84 cm  RIGHT VENTRICLE            IVC RV Basal diam:  2.80 cm    IVC diam: 1.90 cm RV S prime:     6.48 cm/s TAPSE (M-mode): 1.3 cm LEFT ATRIUM             Index       RIGHT ATRIUM           Index LA diam:  4.70 cm 2.09 cm/m  RA Area:     17.70 cm LA Vol (A2C):   90.7 ml 40.35 ml/m RA Volume:   46.90 ml  20.86 ml/m LA Vol (A4C):   68.4 ml 30.43 ml/m LA Biplane Vol: 81.3 ml 36.17 ml/m  AORTIC VALVE  LVOT Vmax:   87.70 cm/s LVOT Vmean:  52.600 cm/s LVOT VTI:    0.163 m  AORTA Ao Root diam: 3.20 cm Ao Asc diam:  3.10 cm MITRAL VALVE MV Area (PHT): 3.03 cm    SHUNTS MV Decel Time: 250 msec    Systemic VTI:  0.16 m MV E velocity: 58.80 cm/s  Systemic Diam: 1.90 cm MV A velocity: 94.10 cm/s MV E/A ratio:  0.62 Buford Dresser MD Electronically signed by Buford Dresser MD Signature Date/Time: 06/01/2020/11:09:32 AM    Final    OCT, Retina - OU - Both Eyes  Result Date: 05/26/2020 Right Eye Quality was good. Scan locations included subfoveal. Central Foveal Thickness: 267. Progression has no prior data. Findings include epiretinal membrane. Left Eye Quality was good. Scan locations included subfoveal. Central Foveal Thickness: 161. Progression has no prior data. Findings include epiretinal membrane. Notes OS with diffuse foveal atrophy, no active maculopathy Epiretinal membrane right eye mild, no active intraretinal fluid nor cystoid macular edema nor CSME  Color Fundus Photography Optos - OU - Both Eyes  Result Date: 05/26/2020 Right Eye Progression has no prior data. Disc findings include neovascularization. Macula : epiretinal membrane. Left Eye Progression has no prior data. Notes OD with epiretinal membrane mild to moderate topographic distortion or changes, old inactive neovascularization from the optic nerve now fibrotic proliferation of the disc stable with traction on the residual low-lying posterior partial vitreous detachment OS, foveal atrophy accounts for acuity most likely as a consequence of prior advanced diabetic retinopathy with loss of the outer ellipsoid zone Layers.  This accounts for acuity.  Mild epiretinal membrane temporally, not visually pathologic. Bilateral proliferative diabetic retinopathy, quiescent     Subjective: Chest pain free  Discharge Exam: Vitals:   06/03/20 1340 06/03/20 1403  BP: 131/75 128/67  Pulse:  68  Resp: 14 18  Temp:  (!) 97.5 F (36.4  C)  SpO2: 98%      General: Pt is alert, awake, not in acute distress Cardiovascular: RRR, S1/S2 +, no rubs, no gallops Respiratory: CTA bilaterally, no wheezing, no rhonchi Abdominal: Soft, NT, ND, bowel sounds + Extremities: no edema, no cyanosis    The results of significant diagnostics from this hospitalization (including imaging, microbiology, ancillary and laboratory) are listed below for reference.     Microbiology: Recent Results (from the past 240 hour(s))  SARS CORONAVIRUS 2 (TAT 6-24 HRS) Nasopharyngeal Nasopharyngeal Swab     Status: None   Collection Time: 06/01/20 12:24 AM   Specimen: Nasopharyngeal Swab  Result Value Ref Range Status   SARS Coronavirus 2 NEGATIVE NEGATIVE Final    Comment: (NOTE) SARS-CoV-2 target nucleic acids are NOT DETECTED.  The SARS-CoV-2 RNA is generally detectable in upper and lower respiratory specimens during the acute phase of infection. Negative results do not preclude SARS-CoV-2 infection, do not rule out co-infections with other pathogens, and should not be used as the sole basis for treatment or other patient management decisions. Negative results must be combined with clinical observations, patient history, and epidemiological information. The expected result is Negative.  Fact Sheet for Patients: SugarRoll.be  Fact Sheet for Healthcare Providers: https://www.woods-mathews.com/  This test is not yet approved or cleared  by the [place] and  has been authorized for detection and/or diagnosis of SARS-CoV-2 by FDA under an Emergency Use Authorization (EUA). This EUA will remain  in effect (meaning this test can be used) for the duration of the COVID-19 declaration under Se ction 564(b)(1) of the Act, 21 U.S.C. section 360bbb-3(b)(1), unless the authorization is terminated or revoked sooner.  Performed at [facility], [place] [address], [place], [place] [address]       Labs: BNP (last 3 results) No results for input(s): BNP in the last 8760 hours. Basic Metabolic Panel: Recent Labs  Lab 05/31/20 1802 06/01/20 0923 06/02/20 0208 06/03/20 0251  NA 131*  --  133* 132*  K 3.5  --  3.8 3.6  CL 93*  --  95* 93*  CO2 24  --  20* 23  GLUCOSE 140*  --  99 163*  BUN 77*  --  88* 82*  CREATININE 17.38*  --  19.21* 18.75*  CALCIUM 8.2*  --  8.0* 8.4*  MG  --  2.1 2.0  --   PHOS  --   --  7.1* 6.7*   Liver Function Tests: Recent Labs  Lab 06/02/20 0208 06/03/20 0251  ALBUMIN 2.8* 2.9*   No results for input(s): LIPASE, AMYLASE in the last 168 hours. No results for input(s): AMMONIA in the last 168 hours. CBC: Recent Labs  Lab 05/31/20 1802 06/02/20 0208 06/03/20 0251  WBC 4.9 5.9 5.6  HGB 10.3* 9.1* 9.1*  HCT 30.4* 25.9* 26.5*  MCV 90.7 90.6 90.4  PLT 249 204 200   Cardiac Enzymes: No results for input(s): CKTOTAL, CKMB, CKMBINDEX, TROPONINI in the last 168 hours. BNP: Invalid input(s): POCBNP CBG: Recent Labs  Lab 06/02/20 1713 06/02/20 2142 06/03/20 0812 06/03/20 1201 06/03/20 1236  GLUCAP 133* 167* 122* 61* 93   D-Dimer No results for input(s): DDIMER in the last 72 hours. Hgb A1c Recent Labs    06/01/20 1043  HGBA1C 6.4*   Lipid Profile No results for input(s): CHOL, HDL, LDLCALC, TRIG, CHOLHDL, LDLDIRECT in the last 72 hours. Thyroid function studies No results for input(s): TSH, T4TOTAL, T3FREE, THYROIDAB in the last 72 hours.  Invalid input(s): FREET3 Anemia work up No results for input(s): VITAMINB12, FOLATE, FERRITIN, TIBC, IRON, RETICCTPCT in the last 72 hours. Urinalysis No results found for: COLORURINE, APPEARANCEUR, [place], [place], [place], [place], [place], [place], PROTEINUR, UROBILINOGEN, NITRITE, LEUKOCYTESUR Sepsis Labs Invalid input(s): PROCALCITONIN,  WBC,  LACTICIDVEN Microbiology Recent Results (from the past 240 hour(s))  SARS CORONAVIRUS 2 (TAT 6-24 HRS) Nasopharyngeal  Nasopharyngeal Swab     Status: None   Collection Time: 06/01/20 12:24 AM   Specimen: Nasopharyngeal Swab  Result Value Ref Range Status   SARS Coronavirus 2 NEGATIVE NEGATIVE Final    Comment: (NOTE) SARS-CoV-2 target nucleic acids are NOT DETECTED.  The SARS-CoV-2 RNA is generally detectable in upper and lower respiratory specimens during the acute phase of infection. Negative results do not preclude SARS-CoV-2 infection, do not rule out co-infections with other pathogens, and should not be used as the sole basis for treatment or other patient management decisions. Negative results must be combined with clinical observations, patient history, and epidemiological information. The expected result is Negative.  Fact Sheet for Patients: SugarRoll.be  Fact Sheet for Healthcare Providers: https://www.woods-mathews.com/  This test is not yet approved or cleared by the [place] FDA and  has been authorized for detection and/or diagnosis of SARS-CoV-2 by FDA under an Emergency Use Authorization (  EUA). This EUA will remain  in effect (meaning this test can be used) for the duration of the COVID-19 declaration under Se ction 564(b)(1) of the Act, 21 U.S.C. section 360bbb-3(b)(1), unless the authorization is terminated or revoked sooner.  Performed at [facility], [place] [address] [place], [place] [address]      Time coordinating discharge: 40 minutes  SIGNED:   Elmarie Shiley, MD  [facility]

## 2020-06-03 NOTE — Interval H&P Note (Signed)
Cath Lab Visit (complete for each Cath Lab visit)  Clinical Evaluation Leading to the Procedure:   ACS: Yes.    Non-ACS:    Anginal Classification: CCS III  Anti-ischemic medical therapy: Minimal Therapy (1 class of medications)  Non-Invasive Test Results: No non-invasive testing performed  Prior CABG: Previous CABG      History and Physical Interval Note:  06/03/2020 10:56 AM  Donald [name]  has presented today for surgery, with the diagnosis of nstemi.  The various methods of treatment have been discussed with the patient and family. After consideration of risks, benefits and other options for treatment, the patient has consented to  Procedure(s): LEFT HEART CATH AND CORS/GRAFTS ANGIOGRAPHY (N/A) as a surgical intervention.  The patient's history has been reviewed, patient examined, no change in status, stable for surgery.  I have reviewed the patient's chart and labs.  Questions were answered to the patient's satisfaction.     Sherren Mocha

## 2020-06-03 NOTE — H&P (View-Only) (Signed)
Progress Note  Patient Name: Donald [name] Date of Encounter: 06/03/2020  [facility] Cardiologist: Angelena Form   Subjective    50 yo with hx of CAD, CABG, ESRD Admitted with ACS + troponin   we have scheduled him for a cath tomorrow He has a dialysis graft in left upper arm .      Inpatient Medications    Scheduled Meds: . aspirin EC  81 mg Oral Daily  . calcium acetate  1,334 mg Oral TID WC  . carvedilol  12.5 mg Oral BID WC  . cinacalcet  30 mg Oral Daily  . furosemide  80 mg Oral BID  . gentamicin cream  1 application Topical Daily  . insulin aspart  0-15 Units Subcutaneous TID WC  . insulin aspart  0-5 Units Subcutaneous QHS  . insulin glargine  20 Units Subcutaneous Daily  . NIFEdipine  60 mg Oral BID  . paricalcitol  3 mcg Oral Daily  . rosuvastatin  10 mg Oral QHS  . sodium chloride flush  3 mL Intravenous Q12H   Continuous Infusions: . sodium chloride    . sodium chloride 10 mL/hr at 06/03/20 0809  . dialysis solution 2.5% low-MG/low-CA     PRN Meds: sodium chloride, acetaminophen **OR** acetaminophen, dianeal solution for CAPD/CCPD with heparin, lactulose, nitroGLYCERIN, prochlorperazine, senna, sodium chloride flush   Vital Signs    Vitals:   06/02/20 2143 06/02/20 2354 06/03/20 0740 06/03/20 0753  BP: (!) 147/71 137/72 123/64 129/67  Pulse: 77 78 75 74  Resp: '18 16 16 17  '$ Temp: 98.1 F (36.7 C) 98.2 F (36.8 C) 98.3 F (36.8 C) 97.6 F (36.4 C)  TempSrc: Oral Oral Oral Oral  SpO2: 93% 92% 100% 100%  Weight:   110.1 kg   Height:        Intake/Output Summary (Last 24 hours) at 06/03/2020 1039 Last data filed at 06/03/2020 0300 Gross per 24 hour  Intake 739.18 ml  Output --  Net 739.18 ml   Last 3 Weights 06/03/2020 06/02/2020 06/02/2020  Weight (lbs) 242 lb 11.6 oz 242 lb 11.6 oz 243 lb 1.6 oz  Weight (kg) 110.1 kg 110.1 kg 110.269 kg      Telemetry    NSR - Personally Reviewed  ECG     nsr  - Personally Reviewed  Physical Exam     GEN: No acute distress.   Neck: No JVD Cardiac: RRR, no murmurs, rubs, or gallops.  Respiratory: Clear to auscultation bilaterally. GI: Soft, nontender, non-distended  MS: No edema; No deformity. Neuro:  Nonfocal  Psych: Normal affect   Labs    High Sensitivity Troponin:   Recent Labs  Lab 05/31/20 1802 05/31/20 2045 06/01/20 1131  TROPONINIHS 65* 219* 598*      Chemistry Recent Labs  Lab 05/31/20 1802 06/02/20 0208 06/03/20 0251  NA 131* 133* 132*  K 3.5 3.8 3.6  CL 93* 95* 93*  CO2 24 20* 23  GLUCOSE 140* 99 163*  BUN 77* 88* 82*  CREATININE 17.38* 19.21* 18.75*  CALCIUM 8.2* 8.0* 8.4*  ALBUMIN  --  2.8* 2.9*  GFRNONAA 3* 3* 3*  ANIONGAP 14 18* 16*     Hematology Recent Labs  Lab 05/31/20 1802 06/02/20 0208 06/03/20 0251  WBC 4.9 5.9 5.6  RBC 3.35* 2.86* 2.93*  HGB 10.3* 9.1* 9.1*  HCT 30.4* 25.9* 26.5*  MCV 90.7 90.6 90.4  MCH 30.7 31.8 31.1  MCHC 33.9 35.1 34.3  RDW 16.7* 16.8* 16.5*  PLT 249 204 200    BNPNo results for input(s): BNP, PROBNP in the last 168 hours.   DDimer No results for input(s): DDIMER in the last 168 hours.   Radiology    No results found.  Cardiac Studies      Patient Profile     50 y.o. male with CAD , ESRD   Assessment & Plan    1.   CAD :     NSTEMI.   Has CABG in 2016.  For cath today    2.   ESRD:   Gets peritoneal dialysis.  He needs to have hid PD fluid exchanged  3.   Acute on chronic combined CHF:   EF is 35-40%.    Going for cath today    For questions or updates, please contact [place] Please consult [email or web address] for contact info under        Signed, Mertie Moores, MD  06/03/2020, 10:39 AM

## 2020-06-03 NOTE — Progress Notes (Signed)
PROGRESS NOTE    CAP WERNICKE  [id number] DOB: 11-07-1970 DOA: 05/31/2020 PCP: Ladell Pier, MD   Brief Narrative: 50 year old with past medical history significant for diabetes type 2, history of vitrectomy, CAD, CABG, hypertension, hyperlipidemia, ESRD on peritoneal dialysis who is coming to the emergency department complaining of sharp precordial chest pain radiated to his left arm associated with shortness of breath happened while he was washing his car.  Initial troponin 65 trending up to 219, EKG showed nonspecific ST-T wave changes and prolonged QT.  Cardiology was consulted and plan is for heart cath on 12/24;     Assessment & Plan:   Principal Problem:   Acute coronary syndrome with high troponin (HCC) Active Problems:   Chest pain   Type 2 diabetes mellitus with peripheral neuropathy (HCC)   Essential hypertension   ESRD on peritoneal dialysis (HCC)   HLD (hyperlipidemia)   Normocytic anemia   Prolonged QT interval  1-non-STEMI: Patient presented with chest pain, elevation of troponin 65--219---599. Continue with heparin drip Continue with aspirin, Coreg, nitro as needed, Procardia, Crestor. Underwent Cath 2/24: Continued patency of the LIMA to LAD graft and saphenous vein graft to OM1 with no disease in either bypass graft.  Patent SVG to right PDA with total occlusion of the PDA and PLA branches beyond the graft insertion site, some collateral filling of the PLA branches identified (suspect culprit lesion). Moderate segmental LV systolic dysfunction based on echo assessment with akinesis of the inferior wall and LVEF 35 to 40% Cardiology recommend medical management. If patient develops refractory pain might need PCI  -Started on plavix. On crestor.   2-Hypertension: Continue with Coreg, Procardia  3-Diabetes type 2 with peripheral neuropathy: Continue with Lantus and sliding scale insulin.  4-Chronic diastolic heart failure: -Continue with lasix.    ESRD on peritoneal dialysis: On lasix.  Appreciate nephrology follow up.   Anemia of chronic disease Hb 10--9.1  Hyperlipidemia: Continue with Crestor  Hyponatremia: In setting of renal failure.  Asymptomatic.   Obesity  with a BMI of 34      Estimated body mass index is 34.83 kg/m as calculated from the following:   Height as of this encounter: '5\' 10"'$  (1.778 m).   Weight as of this encounter: 110.1 kg.   DVT prophylaxis: heparin gtt Code Status: full code Family Communication: care discussed with patient.  Disposition Plan:  Status is: Inpatient  Remains inpatient appropriate because:IV treatments appropriate due to intensity of illness or inability to take PO   Dispo: The patient is from: Home              Anticipated d/c is to: Home              Anticipated d/c date is: 1 day              Patient currently is not medically stable to d/c.   Difficult to place patient No        Consultants:   Cardiology   Procedures:     Antimicrobials:    Subjective: He is chest pain free today. Waiting to have cath.   Objective: Vitals:   06/03/20 1122 06/03/20 1127 06/03/20 1132 06/03/20 1155  BP: 118/72 122/73 121/73 122/68  Pulse: 66 66 65 67  Resp: '15 20 19 14  '$ Temp:    (!) 97.5 F (36.4 C)  TempSrc:    Oral  SpO2: 99% 99% 98% 98%  Weight:      Height:  Intake/Output Summary (Last 24 hours) at 06/03/2020 1359 Last data filed at 06/03/2020 0300 Gross per 24 hour  Intake 739.18 ml  Output -  Net 739.18 ml   Filed Weights   06/02/20 1455 06/02/20 1803 06/03/20 0740  Weight: 110.3 kg 110.1 kg 110.1 kg    Examination:  General exam: NAD Respiratory system:CTA Cardiovascular system: S 1, S 2 RRR Gastrointestinal system: BS present, soft, nt Central nervous system: alert, oriented Extremities: symmetric power.     Data Reviewed: I have personally reviewed following labs and imaging studies  CBC: Recent Labs  Lab  05/31/20 1802 06/02/20 0208 06/03/20 0251  WBC 4.9 5.9 5.6  HGB 10.3* 9.1* 9.1*  HCT 30.4* 25.9* 26.5*  MCV 90.7 90.6 90.4  PLT 249 204 [id number]   Basic Metabolic Panel: Recent Labs  Lab 05/31/20 1802 06/01/20 0923 06/02/20 0208 06/03/20 0251  NA 131*  --  133* 132*  K 3.5  --  3.8 3.6  CL 93*  --  95* 93*  CO2 24  --  20* 23  GLUCOSE 140*  --  99 163*  BUN 77*  --  88* 82*  CREATININE 17.38*  --  19.21* 18.75*  CALCIUM 8.2*  --  8.0* 8.4*  MG  --  2.1 2.0  --   PHOS  --   --  7.1* 6.7*   GFR: Estimated Creatinine Clearance: 6 mL/min (A) (by C-G formula based on SCr of 18.75 mg/dL (H)). Liver Function Tests: Recent Labs  Lab 06/02/20 0208 06/03/20 0251  ALBUMIN 2.8* 2.9*   No results for input(s): LIPASE, AMYLASE in the last 168 hours. No results for input(s): AMMONIA in the last 168 hours. Coagulation Profile: No results for input(s): INR, PROTIME in the last 168 hours. Cardiac Enzymes: No results for input(s): CKTOTAL, CKMB, CKMBINDEX, TROPONINI in the last 168 hours. BNP (last 3 results) No results for input(s): PROBNP in the last 8760 hours. HbA1C: Recent Labs    06/01/20 1043  HGBA1C 6.4*   CBG: Recent Labs  Lab 06/02/20 1713 06/02/20 2142 06/03/20 0812 06/03/20 1201 06/03/20 1236  GLUCAP 133* 167* 122* 61* 93   Lipid Profile: No results for input(s): CHOL, HDL, LDLCALC, TRIG, CHOLHDL, LDLDIRECT in the last 72 hours. Thyroid Function Tests: No results for input(s): TSH, T4TOTAL, FREET4, T3FREE, THYROIDAB in the last 72 hours. Anemia Panel: No results for input(s): VITAMINB12, FOLATE, FERRITIN, TIBC, IRON, RETICCTPCT in the last 72 hours. Sepsis Labs: No results for input(s): PROCALCITON, LATICACIDVEN in the last 168 hours.  Recent Results (from the past 240 hour(s))  SARS CORONAVIRUS 2 (TAT 6-24 HRS) Nasopharyngeal Nasopharyngeal Swab     Status: None   Collection Time: 06/01/20 12:24 AM   Specimen: Nasopharyngeal Swab  Result Value Ref Range  Status   SARS Coronavirus 2 NEGATIVE NEGATIVE Final    Comment: (NOTE) SARS-CoV-2 target nucleic acids are NOT DETECTED.  The SARS-CoV-2 RNA is generally detectable in upper and lower respiratory specimens during the acute phase of infection. Negative results do not preclude SARS-CoV-2 infection, do not rule out co-infections with other pathogens, and should not be used as the sole basis for treatment or other patient management decisions. Negative results must be combined with clinical observations, patient history, and epidemiological information. The expected result is Negative.  Fact Sheet for Patients: SugarRoll.be  Fact Sheet for Healthcare Providers: https://www.woods-mathews.com/  This test is not yet approved or cleared by the [place] FDA and  has been authorized for detection and/or  diagnosis of SARS-CoV-2 by FDA under an Emergency Use Authorization (EUA). This EUA will remain  in effect (meaning this test can be used) for the duration of the COVID-19 declaration under Se ction 564(b)(1) of the Act, 21 U.S.C. section 360bbb-3(b)(1), unless the authorization is terminated or revoked sooner.  Performed at [facility], [place] [address], [place], [place] [address]          Radiology Studies: CARDIAC CATHETERIZATION  Result Date: 06/03/2020 1.  Severe native three-vessel coronary artery disease with patency of the left mainstem, moderate diffuse proximal and mid LAD stenosis, severe ostial left circumflex stenosis, and total occlusion of the RCA. 2.  Continued patency of the LIMA to LAD graft and saphenous vein graft to OM1 with no disease in either bypass graft 3.  Patent SVG to right PDA with total occlusion of the PDA and PLA branches beyond the graft insertion site, some collateral filling of the PLA branches identified (suspect culprit lesion) 4.  Moderate segmental LV systolic dysfunction based on echo assessment  with akinesis of the inferior wall and LVEF 35 to 40% Recommendation: Favor medical therapy.  Suspect interval occlusion just distal to the SVG to PDA graft.  The AV circumflex has severe diffuse disease that would require extensive stenting and supplies a relatively small amount of myocardium.  If refractory angina, PCI of this vessel could be considered. Add clopidogrel 75 mg daily for medical therapy of NSTEMI-ACS.        Scheduled Meds: . aspirin EC  81 mg Oral Daily  . calcium acetate  1,334 mg Oral TID WC  . carvedilol  12.5 mg Oral BID WC  . cinacalcet  30 mg Oral Daily  . clopidogrel  75 mg Oral Daily  . furosemide  80 mg Oral BID  . gentamicin cream  1 application Topical Daily  . insulin aspart  0-15 Units Subcutaneous TID WC  . insulin aspart  0-5 Units Subcutaneous QHS  . insulin glargine  20 Units Subcutaneous Daily  . NIFEdipine  60 mg Oral BID  . paricalcitol  3 mcg Oral Daily  . rosuvastatin  10 mg Oral QHS  . sodium chloride flush  3 mL Intravenous Q12H  . sodium chloride flush  3 mL Intravenous Q12H   Continuous Infusions: . sodium chloride    . dialysis solution 2.5% low-MG/low-CA       LOS: 2 days    Time spent: 35 minutes    Belkys A Regalado, MD [facility]   If 7PM-7AM, please contact night-coverage [email or web address]  06/03/2020, 1:59 PM

## 2020-06-03 NOTE — Plan of Care (Signed)

## 2020-06-03 NOTE — Progress Notes (Signed)
[facility] Progress Note   Subjective:     [name] was seen and examined today at bedside. He has no issues or concerns. He reports tolerating yesterday's PD treatment. Currently denies CP, SOB, ABD pain, N/V/D. Patient scheduled for heart catheterization today.  Objective Vitals:   06/03/20 1240 06/03/20 1310 06/03/20 1340 06/03/20 1403  BP: (!) 107/59 122/75 131/75 128/67  Pulse:    68  Resp: '16 18 14 18  '$ Temp:    (!) 97.5 F (36.4 C)  TempSrc:    Oral  SpO2:   98%   Weight:      Height:       Physical Exam General:Appears comfortable; No acute respiratory distress Head:NCAT sclera not icteric  Lungs:On RA;CTA bilaterally. No wheeze, rales or rhonchi. Breathing is unlabored. Heart:S1 and S2; No murmuror friction rub Abdomen: soft, nontender, +BS, no guarding, no rebound tenderness Lower extremities: no edemaupper/lower extremities, bilateral hips Neuro: AAOx3. Moves all extremities spontaneously. Dialysis Access:RUQ PD Catheter  Filed Weights   06/02/20 1455 06/02/20 1803 06/03/20 0740  Weight: 110.3 kg 110.1 kg 110.1 kg    Intake/Output Summary (Last 24 hours) at 06/03/2020 1458 Last data filed at 06/03/2020 0300 Gross per 24 hour  Intake 739.18 ml  Output -  Net 739.18 ml    Additional Objective Labs: Basic Metabolic Panel: Recent Labs  Lab 05/31/20 1802 06/02/20 0208 06/03/20 0251  NA 131* 133* 132*  K 3.5 3.8 3.6  CL 93* 95* 93*  CO2 24 20* 23  GLUCOSE 140* 99 163*  BUN 77* 88* 82*  CREATININE 17.38* 19.21* 18.75*  CALCIUM 8.2* 8.0* 8.4*  PHOS  --  7.1* 6.7*   Liver Function Tests: Recent Labs  Lab 06/02/20 0208 06/03/20 0251  ALBUMIN 2.8* 2.9*   CBC: Recent Labs  Lab 05/31/20 1802 06/02/20 0208 06/03/20 0251  WBC 4.9 5.9 5.6  HGB 10.3* 9.1* 9.1*  HCT 30.4* 25.9* 26.5*  MCV 90.7 90.6 90.4  PLT 249 204 200   CBG: Recent Labs  Lab 06/02/20 1713 06/02/20 2142 06/03/20 0812 06/03/20 1201 06/03/20 1236   GLUCAP 133* 167* 122* 61* 93   Iron Studies: No results for input(s): IRON, TIBC, TRANSFERRIN, FERRITIN in the last 72 hours. Lab Results  Component Value Date   INR 1.2 12/31/2019   INR 1.1 07/14/2019   Studies/Results: CARDIAC CATHETERIZATION  Result Date: 06/03/2020 1.  Severe native three-vessel coronary artery disease with patency of the left mainstem, moderate diffuse proximal and mid LAD stenosis, severe ostial left circumflex stenosis, and total occlusion of the RCA. 2.  Continued patency of the LIMA to LAD graft and saphenous vein graft to OM1 with no disease in either bypass graft 3.  Patent SVG to right PDA with total occlusion of the PDA and PLA branches beyond the graft insertion site, some collateral filling of the PLA branches identified (suspect culprit lesion) 4.  Moderate segmental LV systolic dysfunction based on echo assessment with akinesis of the inferior wall and LVEF 35 to 40% Recommendation: Favor medical therapy.  Suspect interval occlusion just distal to the SVG to PDA graft.  The AV circumflex has severe diffuse disease that would require extensive stenting and supplies a relatively small amount of myocardium.  If refractory angina, PCI of this vessel could be considered. Add clopidogrel 75 mg daily for medical therapy of NSTEMI-ACS.    Medications: . sodium chloride    . dialysis solution 2.5% low-MG/low-CA     . aspirin EC  81 mg Oral Daily  . calcium acetate  1,334 mg Oral TID WC  . carvedilol  12.5 mg Oral BID WC  . cinacalcet  30 mg Oral Daily  . clopidogrel  75 mg Oral Daily  . furosemide  80 mg Oral BID  . gentamicin cream  1 application Topical Daily  . insulin aspart  0-15 Units Subcutaneous TID WC  . insulin aspart  0-5 Units Subcutaneous QHS  . insulin glargine  20 Units Subcutaneous Daily  . NIFEdipine  60 mg Oral BID  . paricalcitol  3 mcg Oral Daily  . rosuvastatin  10 mg Oral QHS  . sodium chloride flush  3 mL Intravenous Q12H  . sodium  chloride flush  3 mL Intravenous Q12H    Dialysis Orders: PD: [facility] Number of fills: 5 exchanges Fill volume: 2900 Total volume: 16000 EDW: 107.5kg Fill Time: 46mn Dwell Time: 1.5hrs Drain time: 263m Of note: Day dwell/pauses will not be performed during this hospitalization.  Assessment/Plan: 1. NSTEMI: Per previous notes, troponin trending upward. Continuous Heparin infusion. Cardiology consulted. Cardiac catheterization performed today. Catheterization Report 06/03/20: severe native three-vessel coronary artery disease with patency of the left mainstem, moderate diffuse proximal and mid LAD stenosis, severe ostial left circumflex stenosis, and total occlusion of the RCA. Moderate segmental LV systolic dysfunction based on echo assessment with akinesis of the inferior wall and LVEF 35 to 40%. Per Cardiology, plan is to treat medically. If patient continues c/o chest pain, a PCI may be performed. 2. ESRD- On PD at home. Tolerated yesterday's PD treatment. Patient scheduled to be discharged home today.  3. Hypertension/volume- BP trend reviewed. Bps improving. No evidence for fluid volume overload. Patient is stable for discharge. Continue Carvedilol, Lasix, and Nifedipine. 4. Anemiaof CKD- Hgb remains 9.1.  5. Secondary Hyperparathyroidism -Ca 8.4; PO4 6.7; Continue Sensipar and Paricalcitol. 6. Nutrition- Renal diet with fluid restriction 7. Dispo: Patient to be discharged home today.  CoTobie PoetNP [facility] 06/03/2020,2:58 PM  LOS: 2 days

## 2020-06-03 NOTE — Progress Notes (Signed)
Progress Note  Patient Name: Donald [name] Date of Encounter: 06/03/2020  [facility] Cardiologist: Angelena Form   Subjective    50 yo with hx of CAD, CABG, ESRD Admitted with ACS + troponin   we have scheduled him for a cath tomorrow He has a dialysis graft in left upper arm .      Inpatient Medications    Scheduled Meds: . aspirin EC  81 mg Oral Daily  . calcium acetate  1,334 mg Oral TID WC  . carvedilol  12.5 mg Oral BID WC  . cinacalcet  30 mg Oral Daily  . furosemide  80 mg Oral BID  . gentamicin cream  1 application Topical Daily  . insulin aspart  0-15 Units Subcutaneous TID WC  . insulin aspart  0-5 Units Subcutaneous QHS  . insulin glargine  20 Units Subcutaneous Daily  . NIFEdipine  60 mg Oral BID  . paricalcitol  3 mcg Oral Daily  . rosuvastatin  10 mg Oral QHS  . sodium chloride flush  3 mL Intravenous Q12H   Continuous Infusions: . sodium chloride    . sodium chloride 10 mL/hr at 06/03/20 0809  . dialysis solution 2.5% low-MG/low-CA     PRN Meds: sodium chloride, acetaminophen **[name]** acetaminophen, dianeal solution for CAPD/CCPD with heparin, lactulose, nitroGLYCERIN, prochlorperazine, senna, sodium chloride flush   Vital Signs    Vitals:   06/02/20 2143 06/02/20 2354 06/03/20 0740 06/03/20 0753  BP: (!) 147/71 137/72 123/64 129/67  Pulse: 77 78 75 74  Resp: '18 16 16 17  '$ Temp: 98.1 F (36.7 C) 98.2 F (36.8 C) 98.3 F (36.8 C) 97.6 F (36.4 C)  TempSrc: Oral Oral Oral Oral  SpO2: 93% 92% 100% 100%  Weight:   110.1 kg   Height:        Intake/Output Summary (Last 24 hours) at 06/03/2020 1039 Last data filed at 06/03/2020 0300 Gross per 24 hour  Intake 739.18 ml  Output -  Net 739.18 ml   Last 3 Weights 06/03/2020 06/02/2020 06/02/2020  Weight (lbs) 242 lb 11.6 oz 242 lb 11.6 oz 243 lb 1.6 oz  Weight (kg) 110.1 kg 110.1 kg 110.269 kg      Telemetry    NSR - Personally Reviewed  ECG     nsr  - Personally Reviewed  Physical Exam     GEN: No acute distress.   Neck: No JVD Cardiac: RRR, no murmurs, rubs, [name] gallops.  Respiratory: Clear to auscultation bilaterally. GI: Soft, nontender, non-distended  MS: No edema; No deformity. Neuro:  Nonfocal  Psych: Normal affect   Labs    High Sensitivity Troponin:   Recent Labs  Lab 05/31/20 1802 05/31/20 2045 06/01/20 1131  TROPONINIHS 65* 219* 598*      Chemistry Recent Labs  Lab 05/31/20 1802 06/02/20 0208 06/03/20 0251  NA 131* 133* 132*  K 3.5 3.8 3.6  CL 93* 95* 93*  CO2 24 20* 23  GLUCOSE 140* 99 163*  BUN 77* 88* 82*  CREATININE 17.38* 19.21* 18.75*  CALCIUM 8.2* 8.0* 8.4*  ALBUMIN  --  2.8* 2.9*  GFRNONAA 3* 3* 3*  ANIONGAP 14 18* 16*     Hematology Recent Labs  Lab 05/31/20 1802 06/02/20 0208 06/03/20 0251  WBC 4.9 5.9 5.6  RBC 3.35* 2.86* 2.93*  HGB 10.3* 9.1* 9.1*  HCT 30.4* 25.9* 26.5*  MCV 90.7 90.6 90.4  MCH 30.7 31.8 31.1  MCHC 33.9 35.1 34.3  RDW 16.7* 16.8* 16.5*  PLT 249 204 200    BNPNo results for input(s): BNP, PROBNP in the last 168 hours.   DDimer No results for input(s): DDIMER in the last 168 hours.   Radiology    No results found.  Cardiac Studies      Patient Profile     50 y.o. male with CAD , ESRD   Assessment & Plan    1.   CAD :     NSTEMI.   Has CABG in 2016.  For cath today    2.   ESRD:   Gets peritoneal dialysis.  He needs to have hid PD fluid exchanged  3.   Acute on chronic combined CHF:   EF is 35-40%.    Going for cath today    For questions [name] updates, please contact [place] Please consult [email or web address] for contact info under        Signed, Mertie Moores, MD  06/03/2020, 10:39 AM

## 2020-06-03 NOTE — Discharge Instructions (Signed)

## 2020-06-04 ENCOUNTER — Telehealth: Payer: Self-pay

## 2020-06-04 ENCOUNTER — Telehealth: Payer: Self-pay | Admitting: Internal Medicine

## 2020-06-04 ENCOUNTER — Encounter (INDEPENDENT_AMBULATORY_CARE_PROVIDER_SITE_OTHER): Payer: Self-pay | Admitting: Vascular Surgery

## 2020-06-04 DIAGNOSIS — Z992 Dependence on renal dialysis: Secondary | ICD-10-CM | POA: Diagnosis not present

## 2020-06-04 DIAGNOSIS — Z7689 Persons encountering health services in other specified circumstances: Secondary | ICD-10-CM

## 2020-06-04 DIAGNOSIS — N186 End stage renal disease: Secondary | ICD-10-CM | POA: Diagnosis not present

## 2020-06-04 MED ORDER — TRUEPLUS PEN NEEDLES 31G X 6 MM MISC: 6 refills | Status: DC

## 2020-06-04 MED ORDER — FREESTYLE LIBRE SENSOR SYSTEM MISC: 12 refills | Status: AC

## 2020-06-04 NOTE — Telephone Encounter (Signed)
Rx sent 

## 2020-06-04 NOTE — Telephone Encounter (Signed)
Transition Care Management Follow-up Telephone Call  Date of discharge and from where:[facility] on 06/03/2020 How have you been since you were released from the hospital? ok Any questions or concerns? No questions/concerns reported.  Items Reviewed: Did the pt receive and understand the discharge instructions provided? Stated that have the instructions and have no questions.  Medications obtained and verified? He said that they have the medication and have no questions at this time. Stressed the importance of taking his meds as prescribed and I any questions or concerns to call back the center, Name and contact provided.     Any new allergies since your discharge? None reported  Do you have support at home? Yes, wife and daugheter Other (ie: DME, Home Health, etc)       Functional Questionnaire: (I = Independent and D = Dependent) ADL's:  Independent.      Follow up appointments reviewed:   PCP Hospital f/u appt confirmed?Dr Wynetta Emery on 06/15/2020 @ 1050.  [facility] f/u appt confirmed? Cardiology scheduled at this time on 06/08/2020 Are transportation arrangements needed? Pt have transportation   If their condition worsens, is the pt aware to call  their PCP or go to the ED? Yes.Made pt aware if condition worsen or start experiencing rapid weight gain, chest pain, diff breathing, SOB, high fevers, or bleading to refer imediately to ED for further evaluation.  Was the patient provided with contact information for the PCP's office or ED? He has the phone number  Was the pt encouraged to call back with questions or concerns?yes

## 2020-06-04 NOTE — Telephone Encounter (Signed)
Donald [name] could you send in pen needles

## 2020-06-04 NOTE — Telephone Encounter (Signed)
Copied from [place] [phone]. Topic: General - Other >> Jun 03, 2020  3:44 PM Pawlus, Brayton Layman A wrote: Reason for CRM: Pt wanted to know if the needles come with the  "insulin aspart (NOVOLOG) 100 UNIT/ML FlexPen" or if Dr Wynetta Emery needs to send in an order for the needles.

## 2020-06-05 DIAGNOSIS — N186 End stage renal disease: Secondary | ICD-10-CM | POA: Diagnosis not present

## 2020-06-05 DIAGNOSIS — Z992 Dependence on renal dialysis: Secondary | ICD-10-CM | POA: Diagnosis not present

## 2020-06-06 DIAGNOSIS — Z992 Dependence on renal dialysis: Secondary | ICD-10-CM | POA: Diagnosis not present

## 2020-06-06 DIAGNOSIS — N186 End stage renal disease: Secondary | ICD-10-CM | POA: Diagnosis not present

## 2020-06-07 DIAGNOSIS — Z992 Dependence on renal dialysis: Secondary | ICD-10-CM | POA: Diagnosis not present

## 2020-06-07 DIAGNOSIS — N186 End stage renal disease: Secondary | ICD-10-CM | POA: Diagnosis not present

## 2020-06-08 ENCOUNTER — Ambulatory Visit (INDEPENDENT_AMBULATORY_CARE_PROVIDER_SITE_OTHER): Payer: Medicare Other | Admitting: Cardiology

## 2020-06-08 ENCOUNTER — Other Ambulatory Visit: Payer: Self-pay

## 2020-06-08 ENCOUNTER — Encounter: Payer: Self-pay | Admitting: Cardiology

## 2020-06-08 VITALS — BP 137/62 | HR 86 | Ht 70.0 in | Wt 239.2 lb

## 2020-06-08 DIAGNOSIS — E785 Hyperlipidemia, unspecified: Secondary | ICD-10-CM

## 2020-06-08 DIAGNOSIS — Z992 Dependence on renal dialysis: Secondary | ICD-10-CM | POA: Diagnosis not present

## 2020-06-08 DIAGNOSIS — I10 Essential (primary) hypertension: Secondary | ICD-10-CM

## 2020-06-08 DIAGNOSIS — N186 End stage renal disease: Secondary | ICD-10-CM | POA: Diagnosis not present

## 2020-06-08 DIAGNOSIS — R0683 Snoring: Secondary | ICD-10-CM

## 2020-06-08 DIAGNOSIS — I25810 Atherosclerosis of coronary artery bypass graft(s) without angina pectoris: Secondary | ICD-10-CM

## 2020-06-08 DIAGNOSIS — I5042 Chronic combined systolic (congestive) and diastolic (congestive) heart failure: Secondary | ICD-10-CM

## 2020-06-08 MED ORDER — CARVEDILOL 25 MG PO TABS: 25.0000 mg | ORAL_TABLET | Freq: Two times a day (BID) | ORAL | 3 refills | Status: DC

## 2020-06-08 NOTE — Patient Instructions (Signed)
Medication Instructions:  INCREASE carvedilol (Coreg) to 25 mg two times daily  *If you need a refill on your cardiac medications before your next appointment, please call your pharmacy*  Testing/Procedures: Your physician has recommended that you have a sleep study. This test records several body functions during sleep, including: brain activity, eye movement, oxygen and carbon dioxide blood levels, heart rate and rhythm, breathing rate and rhythm, the flow of air through your mouth and nose, snoring, body muscle movements, and chest and belly movement. --this must be approved by insurance prior to scheduling  Follow-Up: At [facility], you and your health needs are our priority.  As part of our continuing mission to provide you with exceptional heart care, we have created designated Provider Care Teams.  These Care Teams include your primary Cardiologist (physician) and Advanced Practice Providers (APPs -  Physician Assistants and Nurse Practitioners) who all work together to provide you with the care you need, when you need it.  We recommend signing up for the patient portal called "MyChart".  Sign up information is provided on this After Visit Summary.  MyChart is used to connect with patients for Virtual Visits (Telemedicine).  Patients are able to view lab/test results, encounter notes, upcoming appointments, etc.  Non-urgent messages can be sent to your provider as well.   To learn more about what you can do with MyChart, go to [email or web address].    Your next appointment:   2 weeks with pharmacist 2 months with Dr. Gardiner Rhyme  Other Instructions You have been referred to Cardiac Rehab at [facility]

## 2020-06-08 NOTE — Progress Notes (Addendum)
Cardiology Office Note:    Date:  06/08/2020   ID:  Donald [name], DOB 1971/03/21, MRN [id number]  PCP:  Donald Pier, MD  Cardiologist:  No primary care provider on file.  Electrophysiologist:  None   Referring MD: Donald Pier, MD   Chief Complaint  Patient presents with  . Coronary Artery Disease    History of Present Illness:    Donald [name] is a 50 y.o. male with a hx of CAD status post CABG (in 2016 in [place]: LIMA-LAD, SVG-OM1, SVG-RPDA), ESRD on peritoneal dialysis, T2DM, left toe amputation, GI bleeding, hypertension, hyperlipidemia who presents for hospital follow-up.  He was admitted to [facility] from 2/21 through 06/03/2020 after presenting with chest pain.  Troponin elevation to 219.  Echo 04/11/2020 showed EF 35 to 40% with inferior hypokinesis, grade 1 diastolic dysfunction, moderate RV dysfunction.  Cath on 06/03/2020 showed severe native three-vessel CAD, patent LIMA-LAD and SVG-OM1, patent SVG-RPDA but total occlusion of PDA and PLA branches beyond the graft insertion site.  Medical therapy was recommended.  Since discharge from the hospital, he reports that he is doing well.  He denies any chest pain.  Does report dyspnea sometimes at night.  Reports he has been walking 2-3 times per week for up to 30 minutes.  Denies any exertional chest pain or dyspnea.  Reports occasional lightheadedness but denies any syncope.  Denies any palpitations or lower extremity edema.  Reports has been taking DAPT, denies any bleeding issues.   Past Medical History:  Diagnosis Date  . Diabetes mellitus without complication ([place])   . History of vitrectomy 2014   anterior, left  . Hx of heart bypass surgery   . Hypertension   . Renal disorder    dialysis     Past Surgical History:  Procedure Laterality Date  . AV FISTULA PLACEMENT    . CARDIAC SURGERY    . COLONOSCOPY WITH PROPOFOL N/A 02/11/2020   Procedure: COLONOSCOPY WITH PROPOFOL;  Surgeon: Toledo, Benay Pike, MD;   Location: [facility] ENDOSCOPY;  Service: Gastroenterology;  Laterality: N/A;  . CORONARY ARTERY BYPASS GRAFT  2016  . ESOPHAGOGASTRODUODENOSCOPY (EGD) WITH PROPOFOL N/A 01/02/2020   Procedure: ESOPHAGOGASTRODUODENOSCOPY (EGD) WITH PROPOFOL;  Surgeon: Lesly Rubenstein, MD;  Location: [facility] ENDOSCOPY;  Service: Endoscopy;  Laterality: N/A;  . INSERTION OF DIALYSIS CATHETER    . left great toe amputation    . LEFT HEART CATH AND CORS/GRAFTS ANGIOGRAPHY N/A 06/03/2020   Procedure: LEFT HEART CATH AND CORS/GRAFTS ANGIOGRAPHY;  Surgeon: Sherren Mocha, MD;  Location: [place] CV LAB;  Service: Cardiovascular;  Laterality: N/A;  . VITRECTOMY Left 2014    Current Medications: Current Meds  Medication Sig  . aspirin EC 81 MG EC tablet Take 1 tablet (81 mg total) by mouth daily.  . Calcium Acetate, Phos Binder, (PHOSLO PO) Take 4 capsules by mouth 3 (three) times daily with meals.  . cinacalcet (SENSIPAR) 30 MG tablet Take 30 mg by mouth daily.  . clopidogrel (PLAVIX) 75 MG tablet Take 1 tablet (75 mg total) by mouth daily.  . Continuous Blood Gluc Sensor (FREESTYLE LIBRE SENSOR SYSTEM) MISC Use to check blood sugar at least 4 times daily. Change sensor Q 2 wks  . furosemide (LASIX) 80 MG tablet Take 1 tablet (80 mg total) by mouth 2 (two) times daily.  [name] gentamicin cream (GARAMYCIN) 0.1 % Apply 1 application topically every morning. Apply to exit site every day  . insulin aspart (NOVOLOG) 100 UNIT/ML FlexPen  Inject 6 Units into the skin 3 (three) times daily with meals.  . insulin glargine (LANTUS SOLOSTAR) 100 UNIT/ML Solostar Pen Inject 20 Units into the skin daily.  . Insulin Pen Needle (TRUEPLUS PEN NEEDLES) 31G X 6 MM MISC Use to inject Novolog TID and Lantus once daily. Total injections in 1 day = 4.  . lactulose (CHRONULAC) 10 GM/15ML solution Take 10 g by mouth daily as needed for mild constipation.  [name] NIFEdipine (ADALAT CC) 60 MG 24 hr tablet Take 60 mg by mouth 2 (two) times daily.  .  nitroGLYCERIN (NITROSTAT) 0.4 MG SL tablet Place 1 tablet (0.4 mg total) under the tongue every 5 (five) minutes as needed for chest pain.  [name] ondansetron (ZOFRAN ODT) 4 MG disintegrating tablet Take 1 tablet (4 mg total) by mouth every 8 (eight) hours as needed for nausea or vomiting.  . paricalcitol (ZEMPLAR) 1 MCG capsule Take 3 capsules (3 mcg total) by mouth daily.  . Peritoneal Dialysis Solutions (DIALYSIS SOLUTION 2.5% LOW-MG/LOW-CA) 394 MOSM/L SOLN dianeal solution As per nephro  . rosuvastatin (CRESTOR) 40 MG tablet Take 40 mg by mouth at bedtime.  . senna (SENOKOT) 8.6 MG tablet Take 2 tablets (17.2 mg total) by mouth daily. (Patient taking differently: Take 2 tablets by mouth daily as needed for constipation.)  . [DISCONTINUED] carvedilol (COREG) 12.5 MG tablet Take 1 tablet (12.5 mg total) by mouth 2 (two) times daily with a meal.     Allergies:   Lisinopril and Other   Social History   Socioeconomic History  . Marital status: Married    Spouse name: Not on file  . Number of children: 6  . Years of education: Not on file  . Highest education level: Some college, no degree  Occupational History  . Not on file  Tobacco Use  . Smoking status: Never Smoker  . Smokeless tobacco: Never Used  Vaping Use  . Vaping Use: Never used  Substance and Sexual Activity  . Alcohol use: Yes    Comment: occasionally  . Drug use: Yes    Types: Marijuana    Comment: occasionally  . Sexual activity: Not on file  Other Topics Concern  . Not on file  Social History Narrative  . Not on file   Social Determinants of Health   Financial Resource Strain: Not on file  Food Insecurity: Not on file  Transportation Needs: Not on file  Physical Activity: Not on file  Stress: Not on file  Social Connections: Not on file     Family History: The patient's family history includes Dementia in his maternal grandmother; Diabetes in his maternal grandmother and mother; Diabetes Mellitus II in his  mother; Heart disease in his mother; Hyperlipidemia in his maternal grandmother; Hypertension in his mother; Kidney failure in his mother.  ROS:   Please see the history of present illness.     All other systems reviewed and are negative.  EKGs/Labs/Other Studies Reviewed:    The following studies were reviewed today:   EKG:  EKG is ordered today.  The ekg ordered today demonstrates normal sinus rhythm, rate 86, QTc 495, LVH with reports patient abnormalities  Recent Labs: 04/08/2020: ALT 32 06/02/2020: Magnesium 2.0 06/03/2020: BUN 82; Creatinine, Ser 18.75; Hemoglobin 9.1; Platelets 200; Potassium 3.6; Sodium 132  Recent Lipid Panel    Component Value Date/Time   CHOL 134 07/14/2019 2209   TRIG 127 07/14/2019 2209   HDL 37 (L) 07/14/2019 2209   CHOLHDL 3.6 07/14/2019 2209  VLDL 25 07/14/2019 2209   LDLCALC 72 07/14/2019 2209    Physical Exam:    VS:  BP 137/62   Pulse 86   Ht '5\' 10"'$  (1.778 m)   Wt 239 lb 3.2 oz (108.5 kg)   SpO2 98%   BMI 34.32 kg/m     Wt Readings from Last 3 Encounters:  06/08/20 239 lb 3.2 oz (108.5 kg)  06/03/20 242 lb 11.6 oz (110.1 kg)  05/31/20 238 lb (108 kg)     GEN: in no acute distress HEENT: Normal NECK: No JVD; No carotid bruits CARDIAC: RRR, no murmurs, rubs, gallops RESPIRATORY:  Clear to auscultation without rales, wheezing or rhonchi  ABDOMEN: Soft, non-tender, non-distended MUSCULOSKELETAL:  No edema; No deformity  SKIN: Warm and dry NEUROLOGIC:  Alert and oriented x 3 PSYCHIATRIC:  Normal affect   ASSESSMENT:    1. Coronary artery disease involving coronary bypass graft of native heart without angina pectoris   2. Snoring   3. Chronic combined systolic and diastolic heart failure ([place])   4. Essential hypertension   5. Hyperlipidemia, unspecified hyperlipidemia type    PLAN:     CAD: status post CABG (in 2016 in [place]: LIMA-LAD, SVG-OM1, SVG-RPDA).  Presented to [facility] with NSTEMI 05/31/2020. Echo 04/11/2020 showed  EF 35 to 40% with inferior hypokinesis, grade 1 diastolic dysfunction, moderate RV dysfunction.  Cath on 06/03/2020 showed severe native three-vessel CAD, patent LIMA-LAD and SVG-OM1, patent SVG-RPDA but total occlusion of PDA and PLA branches beyond the graft insertion site.  Medical therapy was recommended.  Currently denies any anginal symptoms -Continue aspirin 81 mg daily, Plavix 75 mg -Continue rosuvastatin -Cardiac rehab  Chronic combined systolic and diastolic heart failure: Echo 04/11/2020 showed EF 35 to 40% with inferior hypokinesis, grade 1 diastolic dysfunction, moderate RV dysfunction.  Ischemic cardiomyopathy as above -Continue carvedilol, will increase dose to 25 mg twice daily -Continue Lasix 80 mg twice daily and volume removal via peritoneal dialysis -Will schedule on heart failure pharmacy clinic in 2 weeks to continue to titrate GDMT meds.  Would favor weaning off nifedipine and starting Imdur/hydralazine  Hypertension: Continue carvedilol, nifedipine as above  T2DM: On insulin.  A1c 6.4%.  Hyperlipidemia: Continue rosuvastatin.  LDL 72 on 07/14/2018  ESRD: On peritoneal dialysis  Snoring: Check sleep study  RTC in 2 months   Medication Adjustments/Labs and Tests Ordered: Current medicines are reviewed at length with the patient today.  Concerns regarding medicines are outlined above.  Orders Placed This Encounter  Procedures  . AMB referral to cardiac rehabilitation  . EKG 12-Lead  . Split night study   Meds ordered this encounter  Medications  . carvedilol (COREG) 25 MG tablet    Sig: Take 1 tablet (25 mg total) by mouth 2 (two) times daily with a meal.    Dispense:  180 tablet    Refill:  3    Dose increase    Patient Instructions  Medication Instructions:  INCREASE carvedilol (Coreg) to 25 mg two times daily  *If you need a refill on your cardiac medications before your next appointment, please call your pharmacy*  Testing/Procedures: Your physician  has recommended that you have a sleep study. This test records several body functions during sleep, including: brain activity, eye movement, oxygen and carbon dioxide blood levels, heart rate and rhythm, breathing rate and rhythm, the flow of air through your mouth and nose, snoring, body muscle movements, and chest and belly movement. --this must be approved by insurance prior  to scheduling  Follow-Up: At [facility], you and your health needs are our priority.  As part of our continuing mission to provide you with exceptional heart care, we have created designated Provider Care Teams.  These Care Teams include your primary Cardiologist (physician) and Advanced Practice Providers (APPs -  Physician Assistants and Nurse Practitioners) who all work together to provide you with the care you need, when you need it.  We recommend signing up for the patient portal called "MyChart".  Sign up information is provided on this After Visit Summary.  MyChart is used to connect with patients for Virtual Visits (Telemedicine).  Patients are able to view lab/test results, encounter notes, upcoming appointments, etc.  Non-urgent messages can be sent to your provider as well.   To learn more about what you can do with MyChart, go to [email or web address].    Your next appointment:   2 weeks with pharmacist 2 months with Dr. Gardiner Rhyme  Other Instructions You have been referred to Cardiac Rehab at [place], Donato Heinz, MD  06/08/2020 6:03 PM    [place]

## 2020-06-09 ENCOUNTER — Telehealth (INDEPENDENT_AMBULATORY_CARE_PROVIDER_SITE_OTHER): Payer: Self-pay

## 2020-06-09 ENCOUNTER — Telehealth: Payer: Self-pay

## 2020-06-09 DIAGNOSIS — Z992 Dependence on renal dialysis: Secondary | ICD-10-CM | POA: Diagnosis not present

## 2020-06-09 DIAGNOSIS — N186 End stage renal disease: Secondary | ICD-10-CM | POA: Diagnosis not present

## 2020-06-09 NOTE — Telephone Encounter (Signed)
NOVOLOG PA APPROVED UNTIL 04/09/21

## 2020-06-09 NOTE — Telephone Encounter (Signed)
Spoke with the patient and he is scheduled with Dr. Delana Meyer for a left arm fistulagram on 06/22/20 with a 6:45 am arrival time tot the [facility]. Covid testing on 06/18/20 between 8-1 pm at the [facility]. Pre-procedure instructions were discussed and will be mailed.

## 2020-06-10 ENCOUNTER — Telehealth: Payer: Self-pay | Admitting: *Deleted

## 2020-06-10 DIAGNOSIS — N186 End stage renal disease: Secondary | ICD-10-CM | POA: Diagnosis not present

## 2020-06-10 DIAGNOSIS — Z992 Dependence on renal dialysis: Secondary | ICD-10-CM | POA: Diagnosis not present

## 2020-06-10 NOTE — Telephone Encounter (Signed)
Sleep study appointment details left on voicemail.

## 2020-06-11 DIAGNOSIS — N186 End stage renal disease: Secondary | ICD-10-CM | POA: Diagnosis not present

## 2020-06-11 DIAGNOSIS — Z992 Dependence on renal dialysis: Secondary | ICD-10-CM | POA: Diagnosis not present

## 2020-06-12 DIAGNOSIS — Z992 Dependence on renal dialysis: Secondary | ICD-10-CM | POA: Diagnosis not present

## 2020-06-12 DIAGNOSIS — N186 End stage renal disease: Secondary | ICD-10-CM | POA: Diagnosis not present

## 2020-06-13 DIAGNOSIS — N186 End stage renal disease: Secondary | ICD-10-CM | POA: Diagnosis not present

## 2020-06-13 DIAGNOSIS — Z992 Dependence on renal dialysis: Secondary | ICD-10-CM | POA: Diagnosis not present

## 2020-06-14 DIAGNOSIS — N186 End stage renal disease: Secondary | ICD-10-CM | POA: Diagnosis not present

## 2020-06-14 DIAGNOSIS — Z992 Dependence on renal dialysis: Secondary | ICD-10-CM | POA: Diagnosis not present

## 2020-06-15 ENCOUNTER — Ambulatory Visit: Payer: Medicare Other | Admitting: Internal Medicine

## 2020-06-15 DIAGNOSIS — Z992 Dependence on renal dialysis: Secondary | ICD-10-CM | POA: Diagnosis not present

## 2020-06-15 DIAGNOSIS — N186 End stage renal disease: Secondary | ICD-10-CM | POA: Diagnosis not present

## 2020-06-16 DIAGNOSIS — Z992 Dependence on renal dialysis: Secondary | ICD-10-CM | POA: Diagnosis not present

## 2020-06-16 DIAGNOSIS — N186 End stage renal disease: Secondary | ICD-10-CM | POA: Diagnosis not present

## 2020-06-17 DIAGNOSIS — Z7682 Awaiting organ transplant status: Secondary | ICD-10-CM | POA: Diagnosis not present

## 2020-06-17 DIAGNOSIS — Z992 Dependence on renal dialysis: Secondary | ICD-10-CM | POA: Diagnosis not present

## 2020-06-17 DIAGNOSIS — N186 End stage renal disease: Secondary | ICD-10-CM | POA: Diagnosis not present

## 2020-06-17 DIAGNOSIS — R931 Abnormal findings on diagnostic imaging of heart and coronary circulation: Secondary | ICD-10-CM | POA: Diagnosis not present

## 2020-06-17 DIAGNOSIS — Z01810 Encounter for preprocedural cardiovascular examination: Secondary | ICD-10-CM | POA: Diagnosis not present

## 2020-06-17 DIAGNOSIS — I2510 Atherosclerotic heart disease of native coronary artery without angina pectoris: Secondary | ICD-10-CM | POA: Diagnosis not present

## 2020-06-18 ENCOUNTER — Other Ambulatory Visit: Admission: RE | Admit: 2020-06-18 | Payer: Medicare Other | Source: Ambulatory Visit

## 2020-06-18 DIAGNOSIS — Z992 Dependence on renal dialysis: Secondary | ICD-10-CM | POA: Diagnosis not present

## 2020-06-18 DIAGNOSIS — N186 End stage renal disease: Secondary | ICD-10-CM | POA: Diagnosis not present

## 2020-06-19 DIAGNOSIS — N186 End stage renal disease: Secondary | ICD-10-CM | POA: Diagnosis not present

## 2020-06-19 DIAGNOSIS — Z992 Dependence on renal dialysis: Secondary | ICD-10-CM | POA: Diagnosis not present

## 2020-06-20 DIAGNOSIS — Z992 Dependence on renal dialysis: Secondary | ICD-10-CM | POA: Diagnosis not present

## 2020-06-20 DIAGNOSIS — N186 End stage renal disease: Secondary | ICD-10-CM | POA: Diagnosis not present

## 2020-06-21 ENCOUNTER — Telehealth (INDEPENDENT_AMBULATORY_CARE_PROVIDER_SITE_OTHER): Payer: Self-pay

## 2020-06-21 ENCOUNTER — Other Ambulatory Visit (INDEPENDENT_AMBULATORY_CARE_PROVIDER_SITE_OTHER): Payer: Self-pay | Admitting: Nurse Practitioner

## 2020-06-21 DIAGNOSIS — N186 End stage renal disease: Secondary | ICD-10-CM | POA: Diagnosis not present

## 2020-06-21 DIAGNOSIS — Z992 Dependence on renal dialysis: Secondary | ICD-10-CM | POA: Diagnosis not present

## 2020-06-21 NOTE — Telephone Encounter (Signed)
I attempted to contact the patient regarding his covid test on 06/18/20 and due to his mailbox being full I was unable to leave a message. I also called the patient's spouse Ramin Fumero at [phone] and a message was left for a return call.

## 2020-06-22 ENCOUNTER — Ambulatory Visit (INDEPENDENT_AMBULATORY_CARE_PROVIDER_SITE_OTHER): Payer: Medicare Other | Admitting: Pharmacist

## 2020-06-22 ENCOUNTER — Other Ambulatory Visit: Payer: Self-pay

## 2020-06-22 ENCOUNTER — Ambulatory Visit: Admission: RE | Admit: 2020-06-22 | Payer: Medicare Other | Source: Home / Self Care | Admitting: Vascular Surgery

## 2020-06-22 ENCOUNTER — Encounter: Admission: RE | Payer: Self-pay | Source: Home / Self Care

## 2020-06-22 DIAGNOSIS — I5022 Chronic systolic (congestive) heart failure: Secondary | ICD-10-CM

## 2020-06-22 DIAGNOSIS — Z992 Dependence on renal dialysis: Secondary | ICD-10-CM | POA: Diagnosis not present

## 2020-06-22 DIAGNOSIS — N186 End stage renal disease: Secondary | ICD-10-CM | POA: Diagnosis not present

## 2020-06-22 DIAGNOSIS — I5042 Chronic combined systolic (congestive) and diastolic (congestive) heart failure: Secondary | ICD-10-CM | POA: Insufficient documentation

## 2020-06-22 SURGERY — A/V FISTULAGRAM
Anesthesia: Moderate Sedation | Laterality: Left

## 2020-06-22 MED ORDER — HYDRALAZINE HCL 25 MG PO TABS: 25.0000 mg | ORAL_TABLET | Freq: Three times a day (TID) | ORAL | 3 refills | Status: DC

## 2020-06-22 MED ORDER — ISOSORBIDE DINITRATE 20 MG PO TABS: 20.0000 mg | ORAL_TABLET | Freq: Three times a day (TID) | ORAL | 3 refills | Status: DC

## 2020-06-22 NOTE — Progress Notes (Signed)
Patient ID: [name]                 DOB: 03-Nov-1970                      MRN: [id number]     HPI: Donald [name] is a 50 y.o. male referred by Dr. Gardiner Rhyme to pharmacy clinic for HF medication management. PMH is significant for CAD status post CABG (in 2016 in [place]: LIMA-LAD, SVG-OM1, SVG-RPDA), ESRD on peritoneal dialysis, T2DM, left toe amputation, GI bleeding, hypertension, hyperlipidemia. Most recent LVEF 35-40% on 04/11/20.  Today he presents to pharmacy clinic for further medication titration. At last visit with MD carvedilol was increased to '25mg'$  BID. Symptomatically, he is feeling ok, some dizziness, lightheadedness when he stands up, maybe once a week. Fatigue when his blood pressure is low in the AM (about once ever 1.5 weeks). Had some chest pain a few weeks ago. Was seen in ED. Feels SOB when walks, but only some days. Able to complete all ADLs. Activity level low to medium. He does check his weight at home but not daily. No LEE, PND, or orthopnea.   States his blood pressure was very high,177/99, 2 days ago. This is out of the ordinary. Low (98/56 this AM). Feels heavy and weak when his BP is low. Does PD everyday overnight. BP usually lowest in the AM due to this. HR has been 75-87 at home.   Current CHF meds: carvedilol '25mg'$  twice a day, furosemide '80mg'$  twice a day Previously tried: lisinopril (hives) BP goal: <130/80  Family History: The patient's family history includes Dementia in his maternal grandmother; Diabetes in his maternal grandmother and mother; Diabetes Mellitus II in his mother; Heart disease in his mother; Hyperlipidemia in his maternal grandmother; Hypertension in his mother; Kidney failure in his mother.  Social History: no tobacco, occassional ETOH, marijuana in lozenge form  Diet:   Exercise: walks 30 min 2-3 times a week  Home BP readings:   Wt Readings from Last 3 Encounters:  06/08/20 239 lb 3.2 oz (108.5 kg)  06/03/20 242 lb 11.6 oz (110.1  kg)  05/31/20 238 lb (108 kg)   BP Readings from Last 3 Encounters:  06/08/20 137/62  06/03/20 128/67  05/31/20 111/69   Pulse Readings from Last 3 Encounters:  06/08/20 86  06/03/20 68  05/31/20 88    Renal function: Estimated Creatinine Clearance: 5.9 mL/min (A) (by C-G formula based on SCr of 18.75 mg/dL (H)).  Past Medical History:  Diagnosis Date  . Diabetes mellitus without complication ([place])   . History of vitrectomy 2014   anterior, left  . Hx of heart bypass surgery   . Hypertension   . Renal disorder    dialysis     Current Outpatient Medications on File Prior to Visit  Medication Sig Dispense Refill  . aspirin EC 81 MG EC tablet Take 1 tablet (81 mg total) by mouth daily. 30 tablet 3  . Calcium Acetate, Phos Binder, (PHOSLO PO) Take 4 capsules by mouth 3 (three) times daily with meals.    . carvedilol (COREG) 25 MG tablet Take 1 tablet (25 mg total) by mouth 2 (two) times daily with a meal. 180 tablet 3  . cinacalcet (SENSIPAR) 30 MG tablet Take 30 mg by mouth daily.    . clopidogrel (PLAVIX) 75 MG tablet Take 1 tablet (75 mg total) by mouth daily. 30 tablet 3  . Continuous Blood Gluc Sensor (FREESTYLE  [place]) MISC Use to check blood sugar at least 4 times daily. Change sensor Q 2 wks 2 each 12  . furosemide (LASIX) 80 MG tablet Take 1 tablet (80 mg total) by mouth 2 (two) times daily. 60 tablet 3  . gentamicin cream (GARAMYCIN) 0.1 % Apply 1 application topically every morning. Apply to exit site every day    . insulin aspart (NOVOLOG) 100 UNIT/ML FlexPen Inject 6 Units into the skin 3 (three) times daily with meals. 15 mL 11  . insulin glargine (LANTUS SOLOSTAR) 100 UNIT/ML Solostar Pen Inject 20 Units into the skin daily. 15 mL 11  . Insulin Pen Needle (TRUEPLUS PEN NEEDLES) 31G X 6 MM MISC Use to inject Novolog TID and Lantus once daily. Total injections in 1 day = 4. 100 each 6  . lactulose (CHRONULAC) 10 GM/15ML solution Take 10 g by mouth daily  as needed for mild constipation.    [name] NIFEdipine (ADALAT CC) 60 MG 24 hr tablet Take 60 mg by mouth 2 (two) times daily.    . nitroGLYCERIN (NITROSTAT) 0.4 MG SL tablet Place 1 tablet (0.4 mg total) under the tongue every 5 (five) minutes as needed for chest pain. 30 tablet 1  . ondansetron (ZOFRAN ODT) 4 MG disintegrating tablet Take 1 tablet (4 mg total) by mouth every 8 (eight) hours as needed for nausea or vomiting. 20 tablet 0  . paricalcitol (ZEMPLAR) 1 MCG capsule Take 3 capsules (3 mcg total) by mouth daily. 90 capsule 3  . Peritoneal Dialysis Solutions (DIALYSIS SOLUTION 2.5% LOW-MG/LOW-CA) 394 MOSM/L SOLN dianeal solution As per nephro  0  . rosuvastatin (CRESTOR) 40 MG tablet Take 40 mg by mouth at bedtime.    . senna (SENOKOT) 8.6 MG tablet Take 2 tablets (17.2 mg total) by mouth daily. (Patient taking differently: Take 2 tablets by mouth daily as needed for constipation.) 30 tablet 3   No current facility-administered medications on file prior to visit.    Allergies  Allergen Reactions  . Lisinopril Hives  . Other Swelling    Other reaction(s): EGGPLANT     Assessment/Plan:  1. CHF - Carvedilol dose at target dose. Will stop Nifedipine and start Bidil. Will order hydralazine and isosorbide separate due to cost. I have chosen to start at hydralazine '25mg'$  TID due to pt hx of BP dropping low. Will start isosorbide '20mg'$  TID. I have advised patient that if his blood pressure is too low, he can skip dose or take 1/2 tablet of hydralazine. Side effects off each med reviewed. Continue carvedilol '25mg'$  BID. Encouraged patient to exercise as able. Follow up in clinic in about 2 weeks for BP check.   Ramond Dial, Pharm.D, BCPS, CPP [place]  [id number], [place], [place] [address]  Phone: [phone]; Fax: [phone]

## 2020-06-22 NOTE — Patient Instructions (Addendum)
It was nice to meet you!  Please STOP taking Nifedipine  Start taking hydralazine '25mg'$  three times a day and isosorbide dinitrate '20mg'$  three times a day  Continue taking carvedilol '25mg'$  twice a day  Call me at [phone] with any questions

## 2020-06-23 DIAGNOSIS — Z992 Dependence on renal dialysis: Secondary | ICD-10-CM | POA: Diagnosis not present

## 2020-06-23 DIAGNOSIS — N186 End stage renal disease: Secondary | ICD-10-CM | POA: Diagnosis not present

## 2020-06-24 DIAGNOSIS — Z992 Dependence on renal dialysis: Secondary | ICD-10-CM | POA: Diagnosis not present

## 2020-06-24 DIAGNOSIS — N186 End stage renal disease: Secondary | ICD-10-CM | POA: Diagnosis not present

## 2020-06-25 DIAGNOSIS — Z992 Dependence on renal dialysis: Secondary | ICD-10-CM | POA: Diagnosis not present

## 2020-06-25 DIAGNOSIS — N186 End stage renal disease: Secondary | ICD-10-CM | POA: Diagnosis not present

## 2020-06-26 DIAGNOSIS — N186 End stage renal disease: Secondary | ICD-10-CM | POA: Diagnosis not present

## 2020-06-26 DIAGNOSIS — Z992 Dependence on renal dialysis: Secondary | ICD-10-CM | POA: Diagnosis not present

## 2020-06-27 DIAGNOSIS — Z992 Dependence on renal dialysis: Secondary | ICD-10-CM | POA: Diagnosis not present

## 2020-06-27 DIAGNOSIS — N186 End stage renal disease: Secondary | ICD-10-CM | POA: Diagnosis not present

## 2020-06-28 DIAGNOSIS — N186 End stage renal disease: Secondary | ICD-10-CM | POA: Diagnosis not present

## 2020-06-28 DIAGNOSIS — Z992 Dependence on renal dialysis: Secondary | ICD-10-CM | POA: Diagnosis not present

## 2020-06-29 DIAGNOSIS — Z992 Dependence on renal dialysis: Secondary | ICD-10-CM | POA: Diagnosis not present

## 2020-06-29 DIAGNOSIS — N186 End stage renal disease: Secondary | ICD-10-CM | POA: Diagnosis not present

## 2020-06-30 DIAGNOSIS — N186 End stage renal disease: Secondary | ICD-10-CM | POA: Diagnosis not present

## 2020-06-30 DIAGNOSIS — Z992 Dependence on renal dialysis: Secondary | ICD-10-CM | POA: Diagnosis not present

## 2020-07-01 DIAGNOSIS — N186 End stage renal disease: Secondary | ICD-10-CM | POA: Diagnosis not present

## 2020-07-01 DIAGNOSIS — Z992 Dependence on renal dialysis: Secondary | ICD-10-CM | POA: Diagnosis not present

## 2020-07-02 ENCOUNTER — Telehealth (INDEPENDENT_AMBULATORY_CARE_PROVIDER_SITE_OTHER): Payer: Self-pay

## 2020-07-02 DIAGNOSIS — Z992 Dependence on renal dialysis: Secondary | ICD-10-CM | POA: Diagnosis not present

## 2020-07-02 DIAGNOSIS — N186 End stage renal disease: Secondary | ICD-10-CM | POA: Diagnosis not present

## 2020-07-02 NOTE — Telephone Encounter (Signed)
Spoke with the patient and he has been rescheduled to 07/20/20 with a 9:00 am arrival time to the [facility]. Covid testing on 07/16/20 between 8-2 pm at the [place]. Pre-procedure instructions will be mailed.

## 2020-07-03 DIAGNOSIS — N186 End stage renal disease: Secondary | ICD-10-CM | POA: Diagnosis not present

## 2020-07-03 DIAGNOSIS — Z992 Dependence on renal dialysis: Secondary | ICD-10-CM | POA: Diagnosis not present

## 2020-07-04 DIAGNOSIS — N186 End stage renal disease: Secondary | ICD-10-CM | POA: Diagnosis not present

## 2020-07-04 DIAGNOSIS — Z992 Dependence on renal dialysis: Secondary | ICD-10-CM | POA: Diagnosis not present

## 2020-07-05 DIAGNOSIS — Z992 Dependence on renal dialysis: Secondary | ICD-10-CM | POA: Diagnosis not present

## 2020-07-05 DIAGNOSIS — N186 End stage renal disease: Secondary | ICD-10-CM | POA: Diagnosis not present

## 2020-07-06 DIAGNOSIS — N186 End stage renal disease: Secondary | ICD-10-CM | POA: Diagnosis not present

## 2020-07-06 DIAGNOSIS — Z992 Dependence on renal dialysis: Secondary | ICD-10-CM | POA: Diagnosis not present

## 2020-07-07 DIAGNOSIS — N186 End stage renal disease: Secondary | ICD-10-CM | POA: Diagnosis not present

## 2020-07-07 DIAGNOSIS — Z992 Dependence on renal dialysis: Secondary | ICD-10-CM | POA: Diagnosis not present

## 2020-07-08 ENCOUNTER — Ambulatory Visit: Payer: Medicare Other

## 2020-07-08 DIAGNOSIS — Z992 Dependence on renal dialysis: Secondary | ICD-10-CM | POA: Diagnosis not present

## 2020-07-08 DIAGNOSIS — N186 End stage renal disease: Secondary | ICD-10-CM | POA: Diagnosis not present

## 2020-07-09 DIAGNOSIS — N186 End stage renal disease: Secondary | ICD-10-CM | POA: Diagnosis not present

## 2020-07-09 DIAGNOSIS — Z992 Dependence on renal dialysis: Secondary | ICD-10-CM | POA: Diagnosis not present

## 2020-07-10 DIAGNOSIS — N186 End stage renal disease: Secondary | ICD-10-CM | POA: Diagnosis not present

## 2020-07-10 DIAGNOSIS — Z992 Dependence on renal dialysis: Secondary | ICD-10-CM | POA: Diagnosis not present

## 2020-07-11 DIAGNOSIS — N186 End stage renal disease: Secondary | ICD-10-CM | POA: Diagnosis not present

## 2020-07-11 DIAGNOSIS — Z992 Dependence on renal dialysis: Secondary | ICD-10-CM | POA: Diagnosis not present

## 2020-07-12 DIAGNOSIS — N186 End stage renal disease: Secondary | ICD-10-CM | POA: Diagnosis not present

## 2020-07-12 DIAGNOSIS — Z992 Dependence on renal dialysis: Secondary | ICD-10-CM | POA: Diagnosis not present

## 2020-07-13 DIAGNOSIS — N186 End stage renal disease: Secondary | ICD-10-CM | POA: Diagnosis not present

## 2020-07-13 DIAGNOSIS — Z992 Dependence on renal dialysis: Secondary | ICD-10-CM | POA: Diagnosis not present

## 2020-07-14 DIAGNOSIS — Z992 Dependence on renal dialysis: Secondary | ICD-10-CM | POA: Diagnosis not present

## 2020-07-14 DIAGNOSIS — N186 End stage renal disease: Secondary | ICD-10-CM | POA: Diagnosis not present

## 2020-07-15 ENCOUNTER — Ambulatory Visit: Payer: Medicare Other

## 2020-07-15 DIAGNOSIS — Z992 Dependence on renal dialysis: Secondary | ICD-10-CM | POA: Diagnosis not present

## 2020-07-15 DIAGNOSIS — N186 End stage renal disease: Secondary | ICD-10-CM | POA: Diagnosis not present

## 2020-07-15 NOTE — Progress Notes (Deleted)
Patient ID: Donald [name]                 DOB: January 20, 1971                      MRN: [id number]     HPI: Donald [name] is a 50 y.o. male referred by Donald [name] to pharmacy clinic for HF medication management. PMH is significant for CAD status post CABG (in 2016 in [place]: LIMA-LAD, SVG-OM1, SVG-RPDA), ESRD on peritoneal dialysis, T2DM, left toe amputation, GI bleeding, hypertension, hyperlipidemia. Most recent LVEF 35-40% on 04/11/20.  Today he presents to pharmacy clinic for further medication titration. At last visit with nifedipine was stopped and hydralazine '25mg'$  TID and isosorbide '20mg'$  TID was started since this has better data in HF in [facility]. Symptomatically, he is feeling ok, some dizziness, lightheadedness when he stands up, maybe once a week. Fatigue when his blood pressure is low in the AM (about once ever 1.5 weeks). Had some chest pain a few weeks ago. Was seen in ED. Feels SOB when walks, but only some days. Able to complete all ADLs. Activity level low to medium. He does check his weight at home but not daily. No LEE, PND, or orthopnea.   Dizziness, lightheadedness, headache, blurred vision, SOB, swelling Home BP? Lows? HR?   Current CHF meds: carvedilol '25mg'$  twice a day, furosemide '80mg'$  twice a day, hydralazine '25mg'$  three times a day, isosorbide dinitrate '20mg'$  three times a day Previously tried: lisinopril (hives) BP goal: <130/80  Family History: The patient's family history includes Dementia in his maternal grandmother; Diabetes in his maternal grandmother and mother; Diabetes Mellitus II in his mother; Heart disease in his mother; Hyperlipidemia in his maternal grandmother; Hypertension in his mother; Kidney failure in his mother.  Social History: no tobacco, occassional ETOH, marijuana in lozenge form  Diet:   Exercise: walks 30 min 2-3 times a week  Home BP readings:   Wt Readings from Last 3 Encounters:  06/08/20 239 lb 3.2 oz (108.5 kg)  06/03/20 242 lb 11.6 oz  (110.1 kg)  05/31/20 238 lb (108 kg)   BP Readings from Last 3 Encounters:  06/22/20 120/60  06/08/20 137/62  06/03/20 128/67   Pulse Readings from Last 3 Encounters:  06/22/20 72  06/08/20 86  06/03/20 68    Renal function: CrCl cannot be calculated (Patient's most recent lab result is older than the maximum 21 days allowed.).  Past Medical History:  Diagnosis Date  . Diabetes mellitus without complication ([place])   . History of vitrectomy 2014   anterior, left  . Hx of heart bypass surgery   . Hypertension   . Renal disorder    dialysis     Current Outpatient Medications on File Prior to Visit  Medication Sig Dispense Refill  . aspirin EC 81 MG EC tablet Take 1 tablet (81 mg total) by mouth daily. 30 tablet 3  . Calcium Acetate, Phos Binder, (PHOSLO PO) Take 4 capsules by mouth 3 (three) times daily with meals.    . carvedilol (COREG) 25 MG tablet Take 1 tablet (25 mg total) by mouth 2 (two) times daily with a meal. 180 tablet 3  . cinacalcet (SENSIPAR) 30 MG tablet Take 30 mg by mouth daily.    . clopidogrel (PLAVIX) 75 MG tablet Take 1 tablet (75 mg total) by mouth daily. 30 tablet 3  . Continuous Blood Gluc Sensor ([place]) MISC Use to check blood  sugar at least 4 times daily. Change sensor Q 2 wks 2 each 12  . furosemide (LASIX) 80 MG tablet Take 1 tablet (80 mg total) by mouth 2 (two) times daily. 60 tablet 3  . gentamicin cream (GARAMYCIN) 0.1 % Apply 1 application topically every morning. Apply to exit site every day    . hydrALAZINE (APRESOLINE) 25 MG tablet Take 1 tablet (25 mg total) by mouth 3 (three) times daily. 270 tablet 3  . insulin aspart (NOVOLOG) 100 UNIT/ML FlexPen Inject 6 Units into the skin 3 (three) times daily with meals. 15 mL 11  . insulin glargine (LANTUS SOLOSTAR) 100 UNIT/ML Solostar Pen Inject 20 Units into the skin daily. 15 mL 11  . Insulin Pen Needle (TRUEPLUS PEN NEEDLES) 31G X 6 MM MISC Use to inject Novolog TID and  Lantus once daily. Total injections in 1 day = 4. 100 each 6  . isosorbide dinitrate (ISORDIL) 20 MG tablet Take 1 tablet (20 mg total) by mouth 3 (three) times daily. 270 tablet 3  . lactulose (CHRONULAC) 10 GM/15ML solution Take 10 g by mouth daily as needed for mild constipation.    . nitroGLYCERIN (NITROSTAT) 0.4 MG SL tablet Place 1 tablet (0.4 mg total) under the tongue every 5 (five) minutes as needed for chest pain. 30 tablet 1  . ondansetron (ZOFRAN ODT) 4 MG disintegrating tablet Take 1 tablet (4 mg total) by mouth every 8 (eight) hours as needed for nausea or vomiting. 20 tablet 0  . paricalcitol (ZEMPLAR) 1 MCG capsule Take 3 capsules (3 mcg total) by mouth daily. 90 capsule 3  . Peritoneal Dialysis Solutions (DIALYSIS SOLUTION 2.5% LOW-MG/LOW-CA) 394 MOSM/L SOLN dianeal solution As per nephro  0  . rosuvastatin (CRESTOR) 40 MG tablet Take 40 mg by mouth at bedtime.    . senna (SENOKOT) 8.6 MG tablet Take 2 tablets (17.2 mg total) by mouth daily. (Patient taking differently: Take 2 tablets by mouth daily as needed for constipation.) 30 tablet 3   No current facility-administered medications on file prior to visit.    Allergies  Allergen Reactions  . Lisinopril Hives  . Other Swelling    Other reaction(s): EGGPLANT     Assessment/Plan:  1. CHF - Carvedilol dose at target dose. Will stop Nifedipine and start Bidil. Will order hydralazine and isosorbide separate due to cost. I have chosen to start at hydralazine '25mg'$  TID due to pt hx of BP dropping low. Will start isosorbide '20mg'$  TID. I have advised patient that if his blood pressure is too low, he can skip dose or take 1/2 tablet of hydralazine. Side effects off each med reviewed. Continue carvedilol '25mg'$  BID. Encouraged patient to exercise as able. Follow up in clinic in about 2 weeks for BP check.   Donald [name], Pharm.D, BCPS, CPP [facility]  [id number], [place], [place] [address]  Phone: [phone]; Fax: [phone]

## 2020-07-16 ENCOUNTER — Other Ambulatory Visit: Admission: RE | Admit: 2020-07-16 | Payer: Medicare Other | Source: Ambulatory Visit

## 2020-07-16 DIAGNOSIS — Z992 Dependence on renal dialysis: Secondary | ICD-10-CM | POA: Diagnosis not present

## 2020-07-16 DIAGNOSIS — N186 End stage renal disease: Secondary | ICD-10-CM | POA: Diagnosis not present

## 2020-07-16 NOTE — Telephone Encounter (Signed)
Spoke with the patient and his left arm fistulagram has been rescheduled to 08/10/20 with a 6:45 am arrival time to the MM per patient request. Covid testing on 08/06/20 between 8-2 pm at the [facility]. Pre-procedure instructions will be mailed.

## 2020-07-17 DIAGNOSIS — Z992 Dependence on renal dialysis: Secondary | ICD-10-CM | POA: Diagnosis not present

## 2020-07-17 DIAGNOSIS — N186 End stage renal disease: Secondary | ICD-10-CM | POA: Diagnosis not present

## 2020-07-18 DIAGNOSIS — Z992 Dependence on renal dialysis: Secondary | ICD-10-CM | POA: Diagnosis not present

## 2020-07-18 DIAGNOSIS — N186 End stage renal disease: Secondary | ICD-10-CM | POA: Diagnosis not present

## 2020-07-19 ENCOUNTER — Other Ambulatory Visit (INDEPENDENT_AMBULATORY_CARE_PROVIDER_SITE_OTHER): Payer: Self-pay | Admitting: Nurse Practitioner

## 2020-07-19 DIAGNOSIS — Z992 Dependence on renal dialysis: Secondary | ICD-10-CM | POA: Diagnosis not present

## 2020-07-19 DIAGNOSIS — N186 End stage renal disease: Secondary | ICD-10-CM | POA: Diagnosis not present

## 2020-07-20 DIAGNOSIS — Z992 Dependence on renal dialysis: Secondary | ICD-10-CM | POA: Diagnosis not present

## 2020-07-20 DIAGNOSIS — N186 End stage renal disease: Secondary | ICD-10-CM | POA: Diagnosis not present

## 2020-07-21 DIAGNOSIS — D509 Iron deficiency anemia, unspecified: Secondary | ICD-10-CM | POA: Diagnosis not present

## 2020-07-21 DIAGNOSIS — E119 Type 2 diabetes mellitus without complications: Secondary | ICD-10-CM | POA: Diagnosis not present

## 2020-07-21 DIAGNOSIS — N186 End stage renal disease: Secondary | ICD-10-CM | POA: Diagnosis not present

## 2020-07-21 DIAGNOSIS — Z992 Dependence on renal dialysis: Secondary | ICD-10-CM | POA: Diagnosis not present

## 2020-07-22 DIAGNOSIS — Z992 Dependence on renal dialysis: Secondary | ICD-10-CM | POA: Diagnosis not present

## 2020-07-22 DIAGNOSIS — N186 End stage renal disease: Secondary | ICD-10-CM | POA: Diagnosis not present

## 2020-07-23 DIAGNOSIS — Z992 Dependence on renal dialysis: Secondary | ICD-10-CM | POA: Diagnosis not present

## 2020-07-23 DIAGNOSIS — N186 End stage renal disease: Secondary | ICD-10-CM | POA: Diagnosis not present

## 2020-07-24 DIAGNOSIS — N186 End stage renal disease: Secondary | ICD-10-CM | POA: Diagnosis not present

## 2020-07-24 DIAGNOSIS — Z992 Dependence on renal dialysis: Secondary | ICD-10-CM | POA: Diagnosis not present

## 2020-07-25 DIAGNOSIS — N186 End stage renal disease: Secondary | ICD-10-CM | POA: Diagnosis not present

## 2020-07-25 DIAGNOSIS — Z992 Dependence on renal dialysis: Secondary | ICD-10-CM | POA: Diagnosis not present

## 2020-07-26 DIAGNOSIS — Z992 Dependence on renal dialysis: Secondary | ICD-10-CM | POA: Diagnosis not present

## 2020-07-26 DIAGNOSIS — N186 End stage renal disease: Secondary | ICD-10-CM | POA: Diagnosis not present

## 2020-07-27 ENCOUNTER — Encounter (HOSPITAL_BASED_OUTPATIENT_CLINIC_OR_DEPARTMENT_OTHER): Payer: Medicare Other | Admitting: Cardiovascular Disease

## 2020-07-27 DIAGNOSIS — Z992 Dependence on renal dialysis: Secondary | ICD-10-CM | POA: Diagnosis not present

## 2020-07-27 DIAGNOSIS — N186 End stage renal disease: Secondary | ICD-10-CM | POA: Diagnosis not present

## 2020-07-28 DIAGNOSIS — N186 End stage renal disease: Secondary | ICD-10-CM | POA: Diagnosis not present

## 2020-07-28 DIAGNOSIS — Z992 Dependence on renal dialysis: Secondary | ICD-10-CM | POA: Diagnosis not present

## 2020-07-29 ENCOUNTER — Encounter (HOSPITAL_COMMUNITY): Payer: Self-pay

## 2020-07-29 ENCOUNTER — Emergency Department (HOSPITAL_COMMUNITY): Payer: Medicare Other

## 2020-07-29 ENCOUNTER — Other Ambulatory Visit: Payer: Self-pay

## 2020-07-29 ENCOUNTER — Emergency Department (HOSPITAL_COMMUNITY)
Admission: EM | Admit: 2020-07-29 | Discharge: 2020-07-29 | Disposition: A | Discharge status: Home / Self Care | Payer: Medicare Other | Attending: Emergency Medicine | Admitting: Emergency Medicine

## 2020-07-29 DIAGNOSIS — Z794 Long term (current) use of insulin: Secondary | ICD-10-CM | POA: Insufficient documentation

## 2020-07-29 DIAGNOSIS — D631 Anemia in chronic kidney disease: Secondary | ICD-10-CM | POA: Insufficient documentation

## 2020-07-29 DIAGNOSIS — E1140 Type 2 diabetes mellitus with diabetic neuropathy, unspecified: Secondary | ICD-10-CM | POA: Diagnosis not present

## 2020-07-29 DIAGNOSIS — E1122 Type 2 diabetes mellitus with diabetic chronic kidney disease: Secondary | ICD-10-CM | POA: Insufficient documentation

## 2020-07-29 DIAGNOSIS — I132 Hypertensive heart and chronic kidney disease with heart failure and with stage 5 chronic kidney disease, or end stage renal disease: Secondary | ICD-10-CM | POA: Diagnosis not present

## 2020-07-29 DIAGNOSIS — Z992 Dependence on renal dialysis: Secondary | ICD-10-CM | POA: Insufficient documentation

## 2020-07-29 DIAGNOSIS — I517 Cardiomegaly: Secondary | ICD-10-CM | POA: Diagnosis not present

## 2020-07-29 DIAGNOSIS — Z951 Presence of aortocoronary bypass graft: Secondary | ICD-10-CM | POA: Insufficient documentation

## 2020-07-29 DIAGNOSIS — Z79899 Other long term (current) drug therapy: Secondary | ICD-10-CM | POA: Diagnosis not present

## 2020-07-29 DIAGNOSIS — N186 End stage renal disease: Secondary | ICD-10-CM | POA: Insufficient documentation

## 2020-07-29 DIAGNOSIS — I120 Hypertensive chronic kidney disease with stage 5 chronic kidney disease or end stage renal disease: Secondary | ICD-10-CM | POA: Diagnosis not present

## 2020-07-29 DIAGNOSIS — R0789 Other chest pain: Secondary | ICD-10-CM | POA: Diagnosis not present

## 2020-07-29 DIAGNOSIS — Z7902 Long term (current) use of antithrombotics/antiplatelets: Secondary | ICD-10-CM | POA: Diagnosis not present

## 2020-07-29 DIAGNOSIS — E871 Hypo-osmolality and hyponatremia: Secondary | ICD-10-CM

## 2020-07-29 DIAGNOSIS — I5022 Chronic systolic (congestive) heart failure: Secondary | ICD-10-CM | POA: Diagnosis not present

## 2020-07-29 DIAGNOSIS — Z7982 Long term (current) use of aspirin: Secondary | ICD-10-CM | POA: Diagnosis not present

## 2020-07-29 DIAGNOSIS — I25719 Atherosclerosis of autologous vein coronary artery bypass graft(s) with unspecified angina pectoris: Secondary | ICD-10-CM | POA: Insufficient documentation

## 2020-07-29 DIAGNOSIS — R079 Chest pain, unspecified: Secondary | ICD-10-CM | POA: Diagnosis not present

## 2020-07-29 DIAGNOSIS — N189 Chronic kidney disease, unspecified: Secondary | ICD-10-CM

## 2020-07-29 LAB — CBC
HCT: 29.6 % — ABNORMAL LOW (ref 39.0–52.0)
Hemoglobin: 10 g/dL — ABNORMAL LOW (ref 13.0–17.0)
MCH: 31.3 pg (ref 26.0–34.0)
MCHC: 33.8 g/dL (ref 30.0–36.0)
MCV: 92.8 fL (ref 80.0–100.0)
Platelets: 226 10*3/uL (ref 150–400)
RBC: 3.19 MIL/uL — ABNORMAL LOW (ref 4.22–5.81)
RDW: 15.9 % — ABNORMAL HIGH (ref 11.5–15.5)
WBC: 6.2 10*3/uL (ref 4.0–10.5)
nRBC: 0 % (ref 0.0–0.2)

## 2020-07-29 LAB — BASIC METABOLIC PANEL
Anion gap: 15 (ref 5–15)
BUN: 72 mg/dL — ABNORMAL HIGH (ref 6–20)
CO2: 25 mmol/L (ref 22–32)
Calcium: 10.3 mg/dL (ref 8.9–10.3)
Chloride: 91 mmol/L — ABNORMAL LOW (ref 98–111)
Creatinine, Ser: 17.5 mg/dL — ABNORMAL HIGH (ref 0.61–1.24)
GFR, Estimated: 3 mL/min — ABNORMAL LOW (ref >60)
Glucose, Bld: 110 mg/dL — ABNORMAL HIGH (ref 70–99)
Potassium: 4 mmol/L (ref 3.5–5.1)
Sodium: 131 mmol/L — ABNORMAL LOW (ref 135–145)

## 2020-07-29 LAB — TROPONIN I (HIGH SENSITIVITY)
Troponin I (High Sensitivity): 88 ng/L — ABNORMAL HIGH (ref <18)
Troponin I (High Sensitivity): 81 ng/L — ABNORMAL HIGH (ref <18)

## 2020-07-29 MED ORDER — OXYCODONE HCL 5 MG PO TABS
5.0000 mg | ORAL_TABLET | Freq: Once | ORAL | Status: AC
  Administered 2020-07-29: 5 mg via ORAL
  Filled 2020-07-29: qty 1

## 2020-07-29 MED ORDER — OXYCODONE HCL 5 MG PO TABS: 5.0000 mg | ORAL_TABLET | ORAL | 0 refills | Status: DC | PRN

## 2020-07-29 NOTE — Discharge Instructions (Addendum)
Apply ice for 30 minutes at a time, 4 times a day.  Follow-up with your cardiologist.  Return if symptoms are getting worse.

## 2020-07-29 NOTE — ED Triage Notes (Signed)
Emergency Medicine Provider Triage Evaluation Note  Donald [name] , a 50 y.o. male  was evaluated in triage.  Pt complains of chest pain for the past couple of days. Right sided, feels like a ball in his chest. Constant. Worse with coughing & deep breaths. Tonight worsened prompting ED visit.   Review of Systems  Positive: Chest pain Negative: Fever, dyspnea, syncope  Physical Exam  BP 126/73   Pulse 78   Temp 98.6 F (37 C) (Oral)   Resp 16   Ht '5\' 10"'$  (1.778 m)   Wt 106.1 kg   SpO2 97%   BMI 33.58 kg/m  Gen:   Awake, no distress   HEENT:  Atraumatic  Chest:   Right chest wall tenderness to palpation.  Resp:  Normal effort  Cardiac:  Normal rate  Abd:   Nondistended, nontender  MSK:   Moves extremities without difficulty  Neuro:  Speech clear   Medical Decision Making  Medically screening exam initiated at 12:58 AM.  Appropriate orders placed.  Donald [name] was informed that the remainder of the evaluation will be completed by another provider, this initial triage assessment does not replace that evaluation, and the importance of remaining in the ED until their evaluation is complete.  Clinical Impression  Chest pain.    Donald [name], [place] 07/29/20 [phone]

## 2020-07-29 NOTE — ED Notes (Signed)
Patient verbalizes understanding of discharge instructions. Opportunity for questioning and answers were provided. Armband removed by staff, pt discharged from ED ambulatory.   

## 2020-07-29 NOTE — ED Triage Notes (Addendum)
Chest pain x few days that never went away. Presents tonight with right sided chest pain radiating to right arm with some dizziness.    Had peritoneal dialysis tonight.

## 2020-07-29 NOTE — ED Provider Notes (Signed)
[facility] EMERGENCY DEPARTMENT Provider Note   CSN: [id number] Arrival date & time: 07/29/20  [id number]   History Chief Complaint  Patient presents with  . Chest Pain    Donald [name] is a 50 y.o. male.  The history is provided by the patient.  Chest Pain He has history of diabetes, hypertension, hyperlipidemia, end-stage renal disease on peritoneal dialysis, systolic heart failure, coronary artery disease and comes in because of right-sided chest pain for the last 3 days.  Pain has been constant and worse with movement and worse with coughing or sneezing.  Pain had been moderate and he rated it at 6/10, but it got much worse tonight when he got very excited while watching a basketball game and pain went up to 10/10.  At that point, pain radiated down his right arm.  It is back to its baseline now.  He denies dyspnea, nausea, diaphoresis.  He is a non-smoker.  There is no family history of premature coronary atherosclerosis.  Past Medical History:  Diagnosis Date  . Diabetes mellitus without complication ([place])   . History of vitrectomy 2014   anterior, left  . Hx of heart bypass surgery   . Hypertension   . Renal disorder    dialysis     Patient Active Problem List   Diagnosis Date Noted  . Chronic systolic HF (heart failure) ([place]) 06/22/2020  . Acute coronary syndrome with high troponin ([place]) 06/01/2020  . Normocytic anemia 06/01/2020  . Prolonged QT interval 06/01/2020  . Complication of vascular access for dialysis 05/31/2020  . Age-related nuclear cataract of right eye 05/26/2020  . Pseudophakia, left eye 05/26/2020  . Right epiretinal membrane 05/26/2020  . Controlled type 2 diabetes mellitus with stable proliferative retinopathy of both eyes, with long-term current use of insulin ([place]) 05/26/2020  . 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 ([place]) 05/20/2020  . Primary insomnia 05/20/2020  . Hyperlipidemia  associated with type 2 diabetes mellitus ([place]) 05/20/2020  . History of amputation of left great toe ([place]) 05/20/2020  . Elevated troponin   . GI bleed 01/01/2020  . Hematemesis 12/31/2019  . Type 2 diabetes mellitus with peripheral neuropathy ([place]) 12/31/2019  . Essential hypertension   . ESRD on peritoneal dialysis ([place])   . HLD (hyperlipidemia)   . Hypertensive urgency 07/14/2019  . Chest pain     Past Surgical History:  Procedure Laterality Date  . AV FISTULA PLACEMENT    . CARDIAC SURGERY    . COLONOSCOPY WITH PROPOFOL N/A 02/11/2020   Procedure: COLONOSCOPY WITH PROPOFOL;  Surgeon: Toledo, Benay Pike, MD;  Location: [facility] ENDOSCOPY;  Service: Gastroenterology;  Laterality: N/A;  . CORONARY ARTERY BYPASS GRAFT  2016  . ESOPHAGOGASTRODUODENOSCOPY (EGD) WITH PROPOFOL N/A 01/02/2020   Procedure: ESOPHAGOGASTRODUODENOSCOPY (EGD) WITH PROPOFOL;  Surgeon: Lesly Rubenstein, MD;  Location: [facility] ENDOSCOPY;  Service: Endoscopy;  Laterality: N/A;  . INSERTION OF DIALYSIS CATHETER    . left great toe amputation    . LEFT HEART CATH AND CORS/GRAFTS ANGIOGRAPHY N/A 06/03/2020   Procedure: LEFT HEART CATH AND CORS/GRAFTS ANGIOGRAPHY;  Surgeon: Sherren Mocha, MD;  Location: [facility];  Service: Cardiovascular;  Laterality: N/A;  . VITRECTOMY Left 2014       Family History  Problem Relation Age of Onset  . Diabetes Mellitus II Mother   . Heart disease Mother   . Diabetes Mother   . Hypertension Mother   . Kidney failure Mother   .  Diabetes Maternal Grandmother   . Hyperlipidemia Maternal Grandmother   . Dementia Maternal Grandmother     Social History   Tobacco Use  . Smoking status: Never Smoker  . Smokeless tobacco: Never Used  Vaping Use  . Vaping Use: Never used  Substance Use Topics  . Alcohol use: Yes    Comment: occasionally  . Drug use: Yes    Types: Marijuana    Comment: occasionally    Home Medications Prior to Admission medications   Medication Sig  Start Date End Date Taking? Authorizing Provider  aspirin EC 81 MG EC tablet Take 1 tablet (81 mg total) by mouth daily. 07/17/19   Nita Sells, MD  Calcium Acetate, Phos Binder, (PHOSLO PO) Take 4 capsules by mouth 3 (three) times daily with meals. 12/05/19   [provider]  carvedilol (COREG) 25 MG tablet Take 1 tablet (25 mg total) by mouth 2 (two) times daily with a meal. 06/08/20   Donato Heinz, MD  cinacalcet (SENSIPAR) 30 MG tablet Take 30 mg by mouth daily.    [provider]  clopidogrel (PLAVIX) 75 MG tablet Take 1 tablet (75 mg total) by mouth daily. 06/04/20   Regalado, Belkys A, MD  Continuous Blood Gluc Sensor (FREESTYLE LIBRE SENSOR SYSTEM) MISC Use to check blood sugar at least 4 times daily. Change sensor Q 2 wks 06/04/20   Ladell Pier, MD  furosemide (LASIX) 80 MG tablet Take 1 tablet (80 mg total) by mouth 2 (two) times daily. 07/16/19   Nita Sells, MD  gentamicin cream (GARAMYCIN) 0.1 % Apply 1 application topically every morning. Apply to exit site every day    [provider]  hydrALAZINE (APRESOLINE) 25 MG tablet Take 1 tablet (25 mg total) by mouth 3 (three) times daily. 06/22/20   Donato Heinz, MD  insulin aspart (NOVOLOG) 100 UNIT/ML FlexPen Inject 6 Units into the skin 3 (three) times daily with meals. 05/20/20   Ladell Pier, MD  insulin glargine (LANTUS SOLOSTAR) 100 UNIT/ML Solostar Pen Inject 20 Units into the skin daily. 05/20/20   Ladell Pier, MD  Insulin Pen Needle (TRUEPLUS PEN NEEDLES) 31G X 6 MM MISC Use to inject Novolog TID and Lantus once daily. Total injections in 1 day = 4. 06/04/20   Ladell Pier, MD  isosorbide dinitrate (ISORDIL) 20 MG tablet Take 1 tablet (20 mg total) by mouth 3 (three) times daily. 06/22/20   Donato Heinz, MD  lactulose (CHRONULAC) 10 GM/15ML solution Take 10 g by mouth daily as needed for mild constipation. 05/06/20   [provider]   nitroGLYCERIN (NITROSTAT) 0.4 MG SL tablet Place 1 tablet (0.4 mg total) under the tongue every 5 (five) minutes as needed for chest pain. 06/03/20   Regalado, Belkys A, MD  ondansetron (ZOFRAN ODT) 4 MG disintegrating tablet Take 1 tablet (4 mg total) by mouth every 8 (eight) hours as needed for nausea or vomiting. 01/04/20   Raiford Noble Latif, DO  paricalcitol (ZEMPLAR) 1 MCG capsule Take 3 capsules (3 mcg total) by mouth daily. 07/16/19   Nita Sells, MD  Peritoneal Dialysis Solutions (DIALYSIS SOLUTION 2.5% LOW-MG/LOW-CA) 394 MOSM/L SOLN dianeal solution As per nephro 07/16/19   Nita Sells, MD  rosuvastatin (CRESTOR) 40 MG tablet Take 40 mg by mouth at bedtime. 12/19/19   [provider]  senna (SENOKOT) 8.6 MG tablet Take 2 tablets (17.2 mg total) by mouth daily. Patient taking differently: Take 2 tablets by mouth daily  as needed for constipation. 07/16/19   Nita Sells, MD    Allergies    Lisinopril and Other  Review of Systems   Review of Systems  Cardiovascular: Positive for chest pain.  All other systems reviewed and are negative.   Physical Exam Updated Vital Signs BP 126/73   Pulse 78   Temp 98.6 F (37 C) (Oral)   Resp 16   Ht '5\' 10"'$  (1.778 m)   Wt 106.1 kg   SpO2 97%   BMI 33.58 kg/m   Physical Exam Vitals and nursing note reviewed.   50 year old male, resting comfortably and in no acute distress. Vital signs are normal. Oxygen saturation is 97%, which is normal. Head is normocephalic and atraumatic. PERRLA, EOMI. Oropharynx is clear. Neck is nontender and supple without adenopathy or JVD. Back is nontender and there is no CVA tenderness. Lungs are clear without rales, wheezes, or rhonchi. Chest is markedly tender in the right rib cage anteriorly at the level of the third rib.  There is no crepitus. Heart has regular rate and rhythm without murmur. Abdomen is soft, flat, nontender without masses or hepatosplenomegaly and  peristalsis is normoactive.  Peritoneal dialysis catheter present on the right side. Extremities have no cyanosis or edema, full range of motion is present. Skin is warm and dry without rash. Neurologic: Mental status is normal, cranial nerves are intact, there are no motor or sensory deficits.  ED Results / Procedures / Treatments   Labs (all labs ordered are listed, but only abnormal results are displayed) Labs Reviewed  CBC - Abnormal; Notable for the following components:      Result Value   RBC 3.19 (*)    Hemoglobin 10.0 (*)    HCT 29.6 (*)    RDW 15.9 (*)    All other components within normal limits  BASIC METABOLIC PANEL - Abnormal; Notable for the following components:   Sodium 131 (*)    Chloride 91 (*)    Glucose, Bld 110 (*)    BUN 72 (*)    Creatinine, Ser 17.50 (*)    GFR, Estimated 3 (*)    All other components within normal limits  TROPONIN I (HIGH SENSITIVITY) - Abnormal; Notable for the following components:   Troponin I (High Sensitivity) 88 (*)    All other components within normal limits  TROPONIN I (HIGH SENSITIVITY) - Abnormal; Notable for the following components:   Troponin I (High Sensitivity) 81 (*)    All other components within normal limits    EKG EKG Interpretation  Date/Time:  Thursday July 29 2020 00:58:50 EDT Ventricular Rate:  80 PR Interval:  172 QRS Duration: 96 QT Interval:  402 QTC Calculation: 463 R Axis:   46 Text Interpretation: Sinus rhythm with Premature atrial complexes ST & T wave abnormality, consider inferolateral ischemia Prolonged QT Abnormal ECG When compared with ECG of 05/31/2020, REPOLARIZATION ABNORMALITY is more pronounced QT has shortened Confirmed by Delora Fuel ([id number]) on 07/29/2020 3:37:55 AM   Radiology DG Chest 2 View  Result Date: 07/29/2020 CLINICAL DATA:  Chest pain for few days EXAM: CHEST - 2 VIEW COMPARISON:  05/31/2020 FINDINGS: Mild cardiomegaly is again noted and stable. Stable scarring is noted in  the left base unchanged from the prior exam. Left subclavian stent is noted. No acute bony abnormality is seen. No new focal infiltrate is noted. IMPRESSION: Left basilar scarring.  No acute abnormality is noted. Electronically Signed   By: Linus Mako.D.  On: 07/29/2020 01:25    Procedures Procedures   Medications Ordered in ED Medications  oxyCODONE (Oxy IR/ROXICODONE) immediate release tablet 5 mg (has no administration in time range)    ED Course  I have reviewed the triage vital signs and the nursing notes.  Pertinent labs & imaging results that were available during my care of the patient were reviewed by me and considered in my medical decision making (see chart for details).  MDM Rules/Calculators/A&P Right-sided chest pain which seems to be musculoskeletal based on history and physical exam.  ECG shows LVH with repolarization abnormality, repolarization abnormality slightly more pronounced today than with prior.  Chest x-ray shows no acute process.  Labs show elevated troponin which is not as high as it was 06/01/2020.  Hyponatremia is present and unchanged from prior, not felt to be clinically significant.  Anemia is present presumably secondary to renal failure and is improved over value on 06/03/2020.  Repeat troponin has decreased.  Elevated troponin is felt to be secondary to renal failure, no evidence of ACS.  Patient is safe for discharge.  Recommended ice and he is given prescription for small number of oxycodone tablets (he states he cannot take acetaminophen).  Old records are reviewed showing hospitalization for chest pain 2 months ago at which time catheterization showed native three-vessel coronary artery disease with patent grafts.  He is discharged with prescription for oxycodone, follow-up with his cardiologist in the next several days.  Final Clinical Impression(s) / ED Diagnoses Final diagnoses:  Right-sided chest wall pain  End-stage renal disease on peritoneal  dialysis ([place])  Hyponatremia  Anemia associated with chronic renal failure    Rx / DC Orders ED Discharge Orders         Ordered    oxyCODONE (ROXICODONE) 5 MG immediate release tablet  Every 4 hours PRN        07/29/20 0000000           Delora Fuel, MD [id number]

## 2020-07-30 DIAGNOSIS — N186 End stage renal disease: Secondary | ICD-10-CM | POA: Diagnosis not present

## 2020-07-30 DIAGNOSIS — Z992 Dependence on renal dialysis: Secondary | ICD-10-CM | POA: Diagnosis not present

## 2020-07-31 DIAGNOSIS — N186 End stage renal disease: Secondary | ICD-10-CM | POA: Diagnosis not present

## 2020-07-31 DIAGNOSIS — Z992 Dependence on renal dialysis: Secondary | ICD-10-CM | POA: Diagnosis not present

## 2020-08-01 DIAGNOSIS — N186 End stage renal disease: Secondary | ICD-10-CM | POA: Diagnosis not present

## 2020-08-01 DIAGNOSIS — Z992 Dependence on renal dialysis: Secondary | ICD-10-CM | POA: Diagnosis not present

## 2020-08-02 DIAGNOSIS — Z951 Presence of aortocoronary bypass graft: Secondary | ICD-10-CM | POA: Diagnosis not present

## 2020-08-02 DIAGNOSIS — I10 Essential (primary) hypertension: Secondary | ICD-10-CM | POA: Diagnosis not present

## 2020-08-02 DIAGNOSIS — I120 Hypertensive chronic kidney disease with stage 5 chronic kidney disease or end stage renal disease: Secondary | ICD-10-CM | POA: Diagnosis not present

## 2020-08-02 DIAGNOSIS — I132 Hypertensive heart and chronic kidney disease with heart failure and with stage 5 chronic kidney disease, or end stage renal disease: Secondary | ICD-10-CM | POA: Diagnosis not present

## 2020-08-02 DIAGNOSIS — I5031 Acute diastolic (congestive) heart failure: Secondary | ICD-10-CM | POA: Diagnosis not present

## 2020-08-02 DIAGNOSIS — Z992 Dependence on renal dialysis: Secondary | ICD-10-CM | POA: Diagnosis not present

## 2020-08-02 DIAGNOSIS — M791 Myalgia, unspecified site: Secondary | ICD-10-CM | POA: Diagnosis not present

## 2020-08-02 DIAGNOSIS — Z79899 Other long term (current) drug therapy: Secondary | ICD-10-CM | POA: Diagnosis not present

## 2020-08-02 DIAGNOSIS — E0822 Diabetes mellitus due to underlying condition with diabetic chronic kidney disease: Secondary | ICD-10-CM | POA: Diagnosis not present

## 2020-08-02 DIAGNOSIS — R000 Tachycardia, unspecified: Secondary | ICD-10-CM | POA: Diagnosis not present

## 2020-08-02 DIAGNOSIS — Z7682 Awaiting organ transplant status: Secondary | ICD-10-CM | POA: Diagnosis not present

## 2020-08-02 DIAGNOSIS — I21A1 Myocardial infarction type 2: Secondary | ICD-10-CM | POA: Diagnosis not present

## 2020-08-02 DIAGNOSIS — I2510 Atherosclerotic heart disease of native coronary artery without angina pectoris: Secondary | ICD-10-CM | POA: Diagnosis not present

## 2020-08-02 DIAGNOSIS — N186 End stage renal disease: Secondary | ICD-10-CM | POA: Diagnosis not present

## 2020-08-02 DIAGNOSIS — Z01818 Encounter for other preprocedural examination: Secondary | ICD-10-CM | POA: Diagnosis not present

## 2020-08-03 DIAGNOSIS — Z992 Dependence on renal dialysis: Secondary | ICD-10-CM | POA: Diagnosis not present

## 2020-08-03 DIAGNOSIS — N186 End stage renal disease: Secondary | ICD-10-CM | POA: Diagnosis not present

## 2020-08-04 DIAGNOSIS — Z992 Dependence on renal dialysis: Secondary | ICD-10-CM | POA: Diagnosis not present

## 2020-08-04 DIAGNOSIS — N186 End stage renal disease: Secondary | ICD-10-CM | POA: Diagnosis not present

## 2020-08-05 DIAGNOSIS — Z992 Dependence on renal dialysis: Secondary | ICD-10-CM | POA: Diagnosis not present

## 2020-08-05 DIAGNOSIS — N186 End stage renal disease: Secondary | ICD-10-CM | POA: Diagnosis not present

## 2020-08-06 DIAGNOSIS — Z992 Dependence on renal dialysis: Secondary | ICD-10-CM | POA: Diagnosis not present

## 2020-08-06 DIAGNOSIS — N186 End stage renal disease: Secondary | ICD-10-CM | POA: Diagnosis not present

## 2020-08-07 DIAGNOSIS — Z992 Dependence on renal dialysis: Secondary | ICD-10-CM | POA: Diagnosis not present

## 2020-08-07 DIAGNOSIS — N186 End stage renal disease: Secondary | ICD-10-CM | POA: Diagnosis not present

## 2020-08-08 DIAGNOSIS — Z992 Dependence on renal dialysis: Secondary | ICD-10-CM | POA: Diagnosis not present

## 2020-08-08 DIAGNOSIS — N186 End stage renal disease: Secondary | ICD-10-CM | POA: Diagnosis not present

## 2020-08-09 DIAGNOSIS — N186 End stage renal disease: Secondary | ICD-10-CM | POA: Diagnosis not present

## 2020-08-09 DIAGNOSIS — Z992 Dependence on renal dialysis: Secondary | ICD-10-CM | POA: Diagnosis not present

## 2020-08-10 ENCOUNTER — Other Ambulatory Visit (INDEPENDENT_AMBULATORY_CARE_PROVIDER_SITE_OTHER): Payer: Self-pay | Admitting: Nurse Practitioner

## 2020-08-10 DIAGNOSIS — N186 End stage renal disease: Secondary | ICD-10-CM

## 2020-08-10 DIAGNOSIS — Z992 Dependence on renal dialysis: Secondary | ICD-10-CM | POA: Diagnosis not present

## 2020-08-10 MED ORDER — MIDAZOLAM HCL 2 MG/ML PO SYRP: 8.0000 mg | ORAL_SOLUTION | Freq: Once | ORAL | Status: DC | PRN

## 2020-08-10 MED ORDER — METHYLPREDNISOLONE SODIUM SUCC 125 MG IJ SOLR: 125.0000 mg | Freq: Once | INTRAMUSCULAR | Status: DC | PRN

## 2020-08-10 MED ORDER — CEFAZOLIN SODIUM-DEXTROSE 1-4 GM/50ML-% IV SOLN: 1.0000 g | Freq: Once | INTRAVENOUS | Status: DC

## 2020-08-10 MED ORDER — SODIUM CHLORIDE 0.9 % IV SOLN: INTRAVENOUS | Status: DC

## 2020-08-10 MED ORDER — FAMOTIDINE 20 MG PO TABS: 40.0000 mg | ORAL_TABLET | Freq: Once | ORAL | Status: DC | PRN

## 2020-08-10 MED ORDER — DIPHENHYDRAMINE HCL 50 MG/ML IJ SOLN: 50.0000 mg | Freq: Once | INTRAMUSCULAR | Status: DC | PRN

## 2020-08-10 NOTE — Progress Notes (Deleted)
Cardiology Office Note:    Date:  08/10/2020   ID:  Donald [name], DOB [birth year], MRN [id number]  PCP:  Ladell Pier, MD  Cardiologist:  None  Electrophysiologist:  None   Referring MD: Ladell Pier, MD   No chief complaint on file.   History of Present Illness:    Donald [name] is a 50 y.o. male with a hx of CAD status post CABG (in 2016 in [place]: LIMA-LAD, SVG-OM1, SVG-RPDA), ESRD on peritoneal dialysis, T2DM, left toe amputation, GI bleeding, hypertension, hyperlipidemia who presents for hospital follow-up.  He was admitted to [facility] from 2/21 through 06/03/2020 after presenting with chest pain.  Troponin elevation to 219.  Echo 04/11/2020 showed EF 35 to 40% with inferior hypokinesis, grade 1 diastolic dysfunction, moderate RV dysfunction.  Cath on 06/03/2020 showed severe native three-vessel CAD, patent LIMA-LAD and SVG-OM1, patent SVG-RPDA but total occlusion of PDA and PLA branches beyond the graft insertion site.  Medical therapy was recommended.  Since last clinic visit,  discharge from the hospital, he reports that he is doing well.  He denies any chest pain.  Does report dyspnea sometimes at night.  Reports he has been walking 2-3 times per week for up to 30 minutes.  Denies any exertional chest pain or dyspnea.  Reports occasional lightheadedness but denies any syncope.  Denies any palpitations or lower extremity edema.  Reports has been taking DAPT, denies any bleeding issues.   Past Medical History:  Diagnosis Date  . Diabetes mellitus without complication ([place])   . History of vitrectomy 2014   anterior, left  . Hx of heart bypass surgery   . Hypertension   . Renal disorder    dialysis     Past Surgical History:  Procedure Laterality Date  . AV FISTULA PLACEMENT    . CARDIAC SURGERY    . COLONOSCOPY WITH PROPOFOL N/A 02/11/2020   Procedure: COLONOSCOPY WITH PROPOFOL;  Surgeon: Toledo, Benay Pike, MD;  Location: [facility] ENDOSCOPY;  Service:  Gastroenterology;  Laterality: N/A;  . CORONARY ARTERY BYPASS GRAFT  2016  . ESOPHAGOGASTRODUODENOSCOPY (EGD) WITH PROPOFOL N/A 01/02/2020   Procedure: ESOPHAGOGASTRODUODENOSCOPY (EGD) WITH PROPOFOL;  Surgeon: Lesly Rubenstein, MD;  Location: [facility] ENDOSCOPY;  Service: Endoscopy;  Laterality: N/A;  . INSERTION OF DIALYSIS CATHETER    . left great toe amputation    . LEFT HEART CATH AND CORS/GRAFTS ANGIOGRAPHY N/A 06/03/2020   Procedure: LEFT HEART CATH AND CORS/GRAFTS ANGIOGRAPHY;  Surgeon: Sherren Mocha, MD;  Location: [place] [facility];  Service: Cardiovascular;  Laterality: N/A;  . VITRECTOMY Left 2014    Current Medications: No outpatient medications have been marked as taking for the 08/11/20 encounter (Appointment) with Donato Heinz, MD.     Allergies:   Lisinopril and Other   Social History   Socioeconomic History  . Marital status: Married    Spouse name: Not on file  . Number of children: 6  . Years of education: Not on file  . Highest education level: Some college, no degree  Occupational History  . Not on file  Tobacco Use  . Smoking status: Never Smoker  . Smokeless tobacco: Never Used  Vaping Use  . Vaping Use: Never used  Substance and Sexual Activity  . Alcohol use: Yes    Comment: occasionally  . Drug use: Yes    Types: Marijuana    Comment: occasionally  . Sexual activity: Not on file  Other Topics Concern  . Not on file  Social History Narrative  . Not on file   Social Determinants of Health   Financial Resource Strain: Not on file  Food Insecurity: Not on file  Transportation Needs: Not on file  Physical Activity: Not on file  Stress: Not on file  Social Connections: Not on file     Family History: The patient's family history includes Dementia in his maternal grandmother; Diabetes in his maternal grandmother and mother; Diabetes Mellitus II in his mother; Heart disease in his mother; Hyperlipidemia in his maternal grandmother;  Hypertension in his mother; Kidney failure in his mother.  ROS:   Please see the history of present illness.     All other systems reviewed and are negative.  EKGs/Labs/Other Studies Reviewed:    The following studies were reviewed today:   EKG:  EKG is ordered today.  The ekg ordered today demonstrates normal sinus rhythm, rate 86, QTc 495, LVH with reports patient abnormalities  Recent Labs: 04/08/2020: ALT 32 06/02/2020: Magnesium 2.0 07/29/2020: BUN 72; Creatinine, Ser 17.50; Hemoglobin 10.0; Platelets 226; Potassium 4.0; Sodium 131  Recent Lipid Panel    Component Value Date/Time   CHOL 134 07/14/2019 2209   TRIG 127 07/14/2019 2209   HDL 37 (L) 07/14/2019 2209   CHOLHDL 3.6 07/14/2019 2209   VLDL 25 07/14/2019 2209   LDLCALC 72 07/14/2019 2209    Physical Exam:    VS:  There were no vitals taken for this visit.    Wt Readings from Last 3 Encounters:  07/29/20 234 lb (106.1 kg)  06/08/20 239 lb 3.2 oz (108.5 kg)  06/03/20 242 lb 11.6 oz (110.1 kg)     GEN: in no acute distress HEENT: Normal NECK: No JVD; No carotid bruits CARDIAC: RRR, no murmurs, rubs, gallops RESPIRATORY:  Clear to auscultation without rales, wheezing or rhonchi  ABDOMEN: Soft, non-tender, non-distended MUSCULOSKELETAL:  No edema; No deformity  SKIN: Warm and dry NEUROLOGIC:  Alert and oriented x 3 PSYCHIATRIC:  Normal affect   ASSESSMENT:    No diagnosis found. PLAN:     CAD: status post CABG (in 2016 in [place]: LIMA-LAD, SVG-OM1, SVG-RPDA).  [facility] with [facility] 05/31/2020. Echo 04/11/2020 showed EF 35 to 40% with inferior hypokinesis, grade 1 diastolic dysfunction, moderate RV dysfunction.  Cath on 06/03/2020 showed severe native three-vessel CAD, patent LIMA-LAD and SVG-OM1, patent SVG-RPDA but total occlusion of PDA and PLA branches beyond the graft insertion site.  Medical therapy was recommended.  Currently denies any anginal symptoms -Continue aspirin 81 mg daily, Plavix  75 mg -Continue rosuvastatin -Cardiac rehab  Chronic combined systolic and diastolic heart failure: Echo 04/11/2020 showed EF 35 to 40% with inferior hypokinesis, grade 1 diastolic dysfunction, moderate RV dysfunction.  Ischemic cardiomyopathy as above -Continue carvedilol 25 mg twice daily -Continue Lasix 80 mg twice daily and volume removal via peritoneal dialysis -Continue hydralazine 20 mg 3 times daily/Isordil 20 mg 3 times daily  Hypertension: Continue carvedilol, Isordil/hydralazine as above  T2DM: On insulin.  A1c 6.4%.  Hyperlipidemia: Continue rosuvastatin.  LDL 72 on 07/14/2018  ESRD: On peritoneal dialysis  Snoring: Check sleep study  RTC in***   Medication Adjustments/Labs and Tests Ordered: Current medicines are reviewed at length with the patient today.  Concerns regarding medicines are outlined above.  No orders of the defined types were placed in this encounter.  No orders of the defined types were placed in this encounter.   There are no Patient Instructions on file for this visit.   Signed,  Donato Heinz, MD  08/10/2020 [facility]

## 2020-08-11 ENCOUNTER — Ambulatory Visit: Payer: Medicare Other | Admitting: Cardiology

## 2020-08-11 DIAGNOSIS — N186 End stage renal disease: Secondary | ICD-10-CM | POA: Diagnosis not present

## 2020-08-11 DIAGNOSIS — Z992 Dependence on renal dialysis: Secondary | ICD-10-CM | POA: Diagnosis not present

## 2020-08-12 ENCOUNTER — Telehealth (INDEPENDENT_AMBULATORY_CARE_PROVIDER_SITE_OTHER): Payer: Self-pay

## 2020-08-12 DIAGNOSIS — Z992 Dependence on renal dialysis: Secondary | ICD-10-CM | POA: Diagnosis not present

## 2020-08-12 DIAGNOSIS — N186 End stage renal disease: Secondary | ICD-10-CM | POA: Diagnosis not present

## 2020-08-12 NOTE — Telephone Encounter (Signed)
Spoke with the patient and he has been rescheduled to 09/14/20 with a 6:45 am arrival time to the [facility] for his left arm fistulagram with Dr. Delana Meyer. Patient was offered 08/31/20 and 09/07/20 but preferred a June appt.

## 2020-08-13 DIAGNOSIS — Z992 Dependence on renal dialysis: Secondary | ICD-10-CM | POA: Diagnosis not present

## 2020-08-13 DIAGNOSIS — N186 End stage renal disease: Secondary | ICD-10-CM | POA: Diagnosis not present

## 2020-08-14 DIAGNOSIS — N186 End stage renal disease: Secondary | ICD-10-CM | POA: Diagnosis not present

## 2020-08-14 DIAGNOSIS — Z992 Dependence on renal dialysis: Secondary | ICD-10-CM | POA: Diagnosis not present

## 2020-08-15 DIAGNOSIS — Z992 Dependence on renal dialysis: Secondary | ICD-10-CM | POA: Diagnosis not present

## 2020-08-15 DIAGNOSIS — N186 End stage renal disease: Secondary | ICD-10-CM | POA: Diagnosis not present

## 2020-08-16 ENCOUNTER — Telehealth (HOSPITAL_COMMUNITY): Payer: Self-pay | Admitting: *Deleted

## 2020-08-16 DIAGNOSIS — N186 End stage renal disease: Secondary | ICD-10-CM | POA: Diagnosis not present

## 2020-08-16 DIAGNOSIS — Z992 Dependence on renal dialysis: Secondary | ICD-10-CM | POA: Diagnosis not present

## 2020-08-16 NOTE — Telephone Encounter (Signed)
Spoke to patient briefly he plans to come to orientation on  Tomorrow 08/17/20.  Will check with Dr Gardiner Rhyme about the patient proceeding with exercise on Monday as he went to the ED on 07/29/20 with right sided chest wall pain.Barnet Pall, RN,BSN 08/16/2020 4:22 PM

## 2020-08-17 ENCOUNTER — Telehealth (HOSPITAL_COMMUNITY): Payer: Self-pay | Admitting: *Deleted

## 2020-08-17 ENCOUNTER — Ambulatory Visit (HOSPITAL_COMMUNITY): Payer: Medicare Other

## 2020-08-17 DIAGNOSIS — Z992 Dependence on renal dialysis: Secondary | ICD-10-CM | POA: Diagnosis not present

## 2020-08-17 DIAGNOSIS — N186 End stage renal disease: Secondary | ICD-10-CM | POA: Diagnosis not present

## 2020-08-17 DIAGNOSIS — D509 Iron deficiency anemia, unspecified: Secondary | ICD-10-CM | POA: Diagnosis not present

## 2020-08-17 NOTE — Telephone Encounter (Signed)
Patient called to report that he will need to reschedule his orientation appointment scheduled for this morning at 10:30 AM. Patient did not disclose why he is cancelling.Barnet Pall, RN,BSN 08/17/2020 10:19 AM

## 2020-08-18 DIAGNOSIS — N186 End stage renal disease: Secondary | ICD-10-CM | POA: Diagnosis not present

## 2020-08-18 DIAGNOSIS — Z992 Dependence on renal dialysis: Secondary | ICD-10-CM | POA: Diagnosis not present

## 2020-08-19 DIAGNOSIS — N186 End stage renal disease: Secondary | ICD-10-CM | POA: Diagnosis not present

## 2020-08-19 DIAGNOSIS — Z992 Dependence on renal dialysis: Secondary | ICD-10-CM | POA: Diagnosis not present

## 2020-08-20 DIAGNOSIS — N186 End stage renal disease: Secondary | ICD-10-CM | POA: Diagnosis not present

## 2020-08-20 DIAGNOSIS — Z992 Dependence on renal dialysis: Secondary | ICD-10-CM | POA: Diagnosis not present

## 2020-08-21 DIAGNOSIS — Z992 Dependence on renal dialysis: Secondary | ICD-10-CM | POA: Diagnosis not present

## 2020-08-21 DIAGNOSIS — N186 End stage renal disease: Secondary | ICD-10-CM | POA: Diagnosis not present

## 2020-08-22 DIAGNOSIS — Z992 Dependence on renal dialysis: Secondary | ICD-10-CM | POA: Diagnosis not present

## 2020-08-22 DIAGNOSIS — N186 End stage renal disease: Secondary | ICD-10-CM | POA: Diagnosis not present

## 2020-08-23 ENCOUNTER — Other Ambulatory Visit: Payer: Self-pay

## 2020-08-23 ENCOUNTER — Ambulatory Visit (HOSPITAL_COMMUNITY): Payer: Medicare Other

## 2020-08-23 ENCOUNTER — Encounter: Payer: Self-pay | Admitting: Internal Medicine

## 2020-08-23 ENCOUNTER — Ambulatory Visit: Payer: Medicare Other | Attending: Internal Medicine | Admitting: Internal Medicine

## 2020-08-23 VITALS — BP 170/80 | HR 75 | Resp 16 | Wt 243.4 lb

## 2020-08-23 DIAGNOSIS — E1142 Type 2 diabetes mellitus with diabetic polyneuropathy: Secondary | ICD-10-CM | POA: Diagnosis not present

## 2020-08-23 DIAGNOSIS — R110 Nausea: Secondary | ICD-10-CM | POA: Diagnosis not present

## 2020-08-23 DIAGNOSIS — K5900 Constipation, unspecified: Secondary | ICD-10-CM

## 2020-08-23 DIAGNOSIS — N186 End stage renal disease: Secondary | ICD-10-CM

## 2020-08-23 DIAGNOSIS — D649 Anemia, unspecified: Secondary | ICD-10-CM | POA: Insufficient documentation

## 2020-08-23 DIAGNOSIS — I25718 Atherosclerosis of autologous vein coronary artery bypass graft(s) with other forms of angina pectoris: Secondary | ICD-10-CM

## 2020-08-23 DIAGNOSIS — Z992 Dependence on renal dialysis: Secondary | ICD-10-CM

## 2020-08-23 DIAGNOSIS — D631 Anemia in chronic kidney disease: Secondary | ICD-10-CM | POA: Diagnosis not present

## 2020-08-23 DIAGNOSIS — I129 Hypertensive chronic kidney disease with stage 1 through stage 4 chronic kidney disease, or unspecified chronic kidney disease: Secondary | ICD-10-CM | POA: Diagnosis not present

## 2020-08-23 LAB — GLUCOSE, POCT (MANUAL RESULT ENTRY): POC Glucose: 118 mg/dl — AB (ref 70–99)

## 2020-08-23 NOTE — Progress Notes (Signed)
Patient ID: Donald [name], male    DOB: 10/07/1970  MRN: [id number]  CC: Hospitalization Follow-up   Subjective: Donald [name] is a 50 y.o. 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  Patient seen in the emergency room 07/29/2020 with right-sided chest pain x3 days that worsened the evening that he presented for  evaluation.  Chest x-ray without acute changes.  EKG showed some repolarization changes more pronounced than previous with QT prolongation.  Elevated troponin but less compared to several months ago and repeat had trended down.  CBC revealed H&H of 10/29 stable and chronic.  Of note patient had cardiac catheterization 05/2020 which showed severe native three-vessel CAD, patent grafts but total occlusion of PDA and PLA branches beyond the graft insertion site.  Medical therapy was recommended.  Assessed to have MSK pain in ER.  Given Oxycodone but did not tolerate med. -no further CP.  Mild SOB at times.  Sees cardiologist Dr. Gardiner Rhyme and also sees cariology at [facility].  Plan for cardio rehab in June.  Sleep study ordered on last visit with Dr. Gardiner Rhyme -intermittent episodes of drop in BP that tends to occur in the mornings after completing peritoneal dialysis.  Spoke with nephrologist last wk and told to d/c Hydralazine. Checks BP 2x/day.  Gives range 130-160/65-70s after dialysis.  -BP elev today because he was not able to keep pills down this a.m -no abd pain or fever Dec appetite over last wk. Little nausea this a.m.  No diarrhea.  Mild constipated instead.  Took Lactulose over past few days with good results.  Last bowel movement was this morning.  No blood in the stools..   DM: Lab Results  Component Value Date   HGBA1C 6.4  (H) 06/01/2020  checks BS 2-3x/wk. Range 114-150s. Doing okay with eating habits except on days like today when he has nausea.  He tries to walk at least 2-3 times a week for 30 minutes. Compliant with insulins.  On NovoLog 6 units with meals and Lantus 20 units daily.  He has not had any hypoglycemic episodes.       Patient Active Problem List   Diagnosis Date Noted  . Chronic systolic HF (heart failure) ([place]) 06/22/2020  . Acute coronary syndrome with high troponin ([place]) 06/01/2020  . Normocytic anemia 06/01/2020  . Prolonged QT interval 06/01/2020  . Complication of vascular access for dialysis 05/31/2020  . Age-related nuclear cataract of right eye 05/26/2020  . Pseudophakia, left eye 05/26/2020  . Right epiretinal membrane 05/26/2020  . Controlled type 2 diabetes mellitus with stable proliferative retinopathy of both eyes, with long-term current use of insulin ([place]) 05/26/2020  . 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 ([place]) 05/20/2020  . Primary insomnia 05/20/2020  . Hyperlipidemia associated with type 2 diabetes mellitus ([place]) 05/20/2020  . History of amputation of left great toe ([place]) 05/20/2020  . Elevated troponin   . GI bleed 01/01/2020  . Hematemesis 12/31/2019  . Type 2 diabetes mellitus with peripheral neuropathy ([place]) 12/31/2019  . Essential hypertension   . ESRD on peritoneal dialysis ([place])   . HLD (hyperlipidemia)   . Hypertensive urgency 07/14/2019  . Chest pain      Current Outpatient  Medications on File Prior to Visit  Medication Sig Dispense Refill  . aspirin EC 81 MG EC tablet Take 1 tablet (81 mg total) by mouth daily. (Patient taking differently: Take 81 mg by mouth in the morning.) 30 tablet 3  . calcitRIOL (ROCALTROL) 0.5 MCG capsule Take 2 mcg by mouth in the morning.    . calcium acetate (PHOSLO) 667 MG capsule Take 667-2,001 mg by mouth See admin instructions. Take 3 capsules by mouth with each meal & take  1 capsule with snacks    . carvedilol (COREG) 25 MG tablet Take 1 tablet (25 mg total) by mouth 2 (two) times daily with a meal. 180 tablet 3  . clopidogrel (PLAVIX) 75 MG tablet Take 1 tablet (75 mg total) by mouth daily. (Patient taking differently: Take 75 mg by mouth in the morning.) 30 tablet 3  . Continuous Blood Gluc Sensor (FREESTYLE LIBRE SENSOR SYSTEM) MISC Use to check blood sugar at least 4 times daily. Change sensor Q 2 wks 2 each 12  . gentamicin cream (GARAMYCIN) 0.1 % Apply 1 application topically every morning. Apply to exit site every day    . insulin aspart (NOVOLOG) 100 UNIT/ML FlexPen Inject 6 Units into the skin 3 (three) times daily with meals. 15 mL 11  . insulin glargine (LANTUS SOLOSTAR) 100 UNIT/ML Solostar Pen Inject 20 Units into the skin daily. (Patient taking differently: Inject 20 Units into the skin every evening.) 15 mL 11  . Insulin Pen Needle (TRUEPLUS PEN NEEDLES) 31G X 6 MM MISC Use to inject Novolog TID and Lantus once daily. Total injections in 1 day = 4. 100 each 6  . isosorbide dinitrate (ISORDIL) 20 MG tablet Take 1 tablet (20 mg total) by mouth 3 (three) times daily. 270 tablet 3  . nitroGLYCERIN (NITROSTAT) 0.4 MG SL tablet Place 1 tablet (0.4 mg total) under the tongue every 5 (five) minutes as needed for chest pain. (Patient not taking: No sig reported) 30 tablet 1  . oxyCODONE (ROXICODONE) 5 MG immediate release tablet Take 1 tablet (5 mg total) by mouth every 4 (four) hours as needed for severe pain. (Patient not taking: Reported on 08/23/2020) 15 tablet 0  . pantoprazole (PROTONIX) 40 MG tablet Take 40 mg by mouth in the morning.    . Peritoneal Dialysis Solutions (DIALYSIS SOLUTION 2.5% LOW-MG/LOW-CA) 394 MOSM/L SOLN dianeal solution As per nephro  0  . rosuvastatin (CRESTOR) 40 MG tablet Take 40 mg by mouth in the morning.    . senna (SENOKOT) 8.6 MG tablet Take 2 tablets (17.2 mg total) by mouth daily. (Patient taking differently: Take 2 tablets by  mouth daily as needed for constipation.) 30 tablet 3   Current Facility-Administered Medications on File Prior to Visit  Medication Dose Route Frequency Provider Last Rate Last Admin  . 0.9 %  sodium chloride infusion   Intravenous Continuous Kris Hartmann, NP      . ceFAZolin (ANCEF) IVPB 1 g/50 mL premix  1 g Intravenous Once Kris Hartmann, NP      . diphenhydrAMINE (BENADRYL) injection 50 mg  50 mg Intravenous Once PRN Kris Hartmann, NP      . famotidine (PEPCID) tablet 40 mg  40 mg Oral Once PRN Kris Hartmann, NP      . methylPREDNISolone sodium succinate (SOLU-MEDROL) 125 mg/2 mL injection 125 mg  125 mg Intravenous Once PRN Kris Hartmann, NP      . midazolam (VERSED) 2 MG/ML syrup 8  mg  8 mg Oral Once PRN Kris Hartmann, NP        Allergies  Allergen Reactions  . Lisinopril Hives  . Other Swelling    Other reaction(s): EGGPLANT    Social History   Socioeconomic History  . Marital status: Married    Spouse name: Not on file  . Number of children: 6  . Years of education: Not on file  . Highest education level: Some college, no degree  Occupational History  . Not on file  Tobacco Use  . Smoking status: Never Smoker  . Smokeless tobacco: Never Used  Vaping Use  . Vaping Use: Never used  Substance and Sexual Activity  . Alcohol use: Yes    Comment: occasionally  . Drug use: Yes    Types: Marijuana    Comment: occasionally  . Sexual activity: Not on file  Other Topics Concern  . Not on file  Social History Narrative  . Not on file   Social Determinants of Health   Financial Resource Strain: Not on file  Food Insecurity: Not on file  Transportation Needs: Not on file  Physical Activity: Not on file  Stress: Not on file  Social Connections: Not on file  Intimate Partner Violence: Not on file    Family History  Problem Relation Age of Onset  . Diabetes Mellitus II Mother   . Heart disease Mother   . Diabetes Mother   . Hypertension Mother   .  Kidney failure Mother   . Diabetes Maternal Grandmother   . Hyperlipidemia Maternal Grandmother   . Dementia Maternal Grandmother     Past Surgical History:  Procedure Laterality Date  . AV FISTULA PLACEMENT    . CARDIAC SURGERY    . COLONOSCOPY WITH PROPOFOL N/A 02/11/2020   Procedure: COLONOSCOPY WITH PROPOFOL;  Surgeon: Toledo, Benay Pike, MD;  Location: [facility] ENDOSCOPY;  Service: Gastroenterology;  Laterality: N/A;  . CORONARY ARTERY BYPASS GRAFT  2016  . ESOPHAGOGASTRODUODENOSCOPY (EGD) WITH PROPOFOL N/A 01/02/2020   Procedure: ESOPHAGOGASTRODUODENOSCOPY (EGD) WITH PROPOFOL;  Surgeon: Lesly Rubenstein, MD;  Location: [facility] ENDOSCOPY;  Service: Endoscopy;  Laterality: N/A;  . INSERTION OF DIALYSIS CATHETER    . left great toe amputation    . LEFT HEART CATH AND CORS/GRAFTS ANGIOGRAPHY N/A 06/03/2020   Procedure: LEFT HEART CATH AND CORS/GRAFTS ANGIOGRAPHY;  Surgeon: Sherren Mocha, MD;  Location: [place] [facility];  Service: Cardiovascular;  Laterality: N/A;  . VITRECTOMY Left 2014    ROS: Review of Systems Negative except as stated above  PHYSICAL EXAM: BP (!) 170/80   Pulse 75   Resp 16   Wt 243 lb 6.4 oz (110.4 kg)   SpO2 92%   BMI 34.92 kg/m   Wt Readings from Last 3 Encounters:  08/23/20 243 lb 6.4 oz (110.4 kg)  07/29/20 234 lb (106.1 kg)  06/08/20 239 lb 3.2 oz (108.5 kg)    Physical Exam  General appearance - alert, middle-age African-American male who appears chronically ill but is in NAD.   Mental status - normal mood, behavior, speech, dress, motor activity, and thought processes Neck - supple, no significant adenopathy.  Has lipoma at the back of the skull Chest - clear to auscultation, no wheezes, rales or rhonchi, symmetric air entry Heart -regular rate rhythm with premature beat every fifth beat Abdomen -peritoneal dialysis site appears clean.  No distention.  Normal bowel sounds.  Soft and nontender no organomegaly. Extremities -trace bilateral lower  extremity edema CMP  Latest Ref Rng & Units 07/29/2020 06/03/2020 06/02/2020  Glucose 70 - 99 mg/dL 110(H) 163(H) 99  BUN 6 - 20 mg/dL 72(H) 82(H) 88(H)  Creatinine 0.61 - 1.24 mg/dL 17.50(H) 18.75(H) 19.21(H)  Sodium 135 - 145 mmol/L 131(L) 132(L) 133(L)  Potassium 3.5 - 5.1 mmol/L 4.0 3.6 3.8  Chloride 98 - 111 mmol/L 91(L) 93(L) 95(L)  CO2 22 - 32 mmol/L 25 23 20(L)  Calcium 8.9 - 10.3 mg/dL 10.3 8.4(L) 8.0(L)  Total Protein 6.5 - 8.1 g/dL - - -  Total Bilirubin 0.3 - 1.2 mg/dL - - -  Alkaline Phos 38 - 126 U/L - - -  AST 15 - 41 U/L - - -  ALT 0 - 44 U/L - - -   Lipid Panel     Component Value Date/Time   CHOL 134 07/14/2019 2209   TRIG 127 07/14/2019 2209   HDL 37 (L) 07/14/2019 2209   CHOLHDL 3.6 07/14/2019 2209   VLDL 25 07/14/2019 2209   LDLCALC 72 07/14/2019 2209    CBC    Component Value Date/Time   WBC 6.2 07/29/2020 0105   RBC 3.19 (L) 07/29/2020 0105   HGB 10.0 (L) 07/29/2020 0105   HCT 29.6 (L) 07/29/2020 0105   PLT 226 07/29/2020 0105   MCV 92.8 07/29/2020 0105   MCH 31.3 07/29/2020 0105   MCHC 33.8 07/29/2020 0105   RDW 15.9 (H) 07/29/2020 0105   LYMPHSABS 1.4 01/04/2020 0730   MONOABS 0.6 01/04/2020 0730   EOSABS 0.5 01/04/2020 0730   BASOSABS 0.0 01/04/2020 0730    ASSESSMENT AND PLAN: 1. Type 2 diabetes mellitus with peripheral neuropathy (HCC) At goal based on blood sugar readings and A1c.  Continue current dose of Lantus and NovoLog insulin.  Encouraged to continue healthy eating habits. - POCT glucose (manual entry)  2. Nausea in adult patient Likely related to end-stage renal disease.  Other possibility can include diabetic gastroparesis.  I wanted to prescribe low-dose Zofran for him but he does have history of QT prolongation so we could not do Zofran, Phenergan or Reglan.  However patient states that he does not feel that he needs any medication for this as it tends to come and go.  I recommended eating toast and banana to try to help settle  his stomach.  Doubt bowel obstruction as exam did not suggest that and he did have a bowel movement today. -Be seen in the emergency room if he develops any vomiting and/or abdominal pain with vomiting.  3. Hypertensive renal disease Not at goal.  Patient was not able to keep his blood pressure medicines down this morning.  He will try to take them again after he gets home and get some meal on his stomach. - Basic metabolic panel  4. ESRD on peritoneal dialysis [facility]) Compliant with his peritoneal dialysis and follows up with nephrology regularly. On transplant list  5. 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 (HCC) Stable.  He will continue aspirin, Plavix, Crestor, carvedilol  6. Constipation, unspecified constipation type Patient has lactulose at home which he uses when needed.  7. Anemia due to chronic kidney disease, on chronic dialysis ([place]) Stable.     Patient was given the opportunity to ask questions.  Patient verbalized understanding of the plan and was able to repeat key elements of the plan.   Orders Placed This Encounter  Procedures  . Basic metabolic panel  . POCT glucose (manual entry)  Requested Prescriptions    No prescriptions requested or ordered in this encounter    Return in about 4 months (around 12/24/2020) for Give appt with Lurena Joiner in 2-3 wks for [facility] Visit.  [name], MD, FACP

## 2020-08-23 NOTE — Patient Instructions (Signed)
When you return home today, try eating toast or banana to settle your stomach.  If you develop any vomiting, you should be seen in the emergency room.  Your blood pressure is elevated.  Try to take your medications again when you return home.

## 2020-08-24 DIAGNOSIS — Z992 Dependence on renal dialysis: Secondary | ICD-10-CM | POA: Diagnosis not present

## 2020-08-24 DIAGNOSIS — N186 End stage renal disease: Secondary | ICD-10-CM | POA: Diagnosis not present

## 2020-08-24 LAB — BASIC METABOLIC PANEL
BUN/Creatinine Ratio: 4 — ABNORMAL LOW (ref 9–20)
BUN: 73 mg/dL — ABNORMAL HIGH (ref 6–24)
CO2: 22 mmol/L (ref 20–29)
Calcium: 9.3 mg/dL (ref 8.7–10.2)
Chloride: 90 mmol/L — ABNORMAL LOW (ref 96–106)
Creatinine, Ser: 17.51 mg/dL (ref 0.76–1.27)
Glucose: 110 mg/dL — ABNORMAL HIGH (ref 65–99)
Potassium: 5.2 mmol/L (ref 3.5–5.2)
Sodium: 136 mmol/L (ref 134–144)
eGFR: 3 mL/min/{1.73_m2} — ABNORMAL LOW (ref >59)

## 2020-08-25 ENCOUNTER — Ambulatory Visit (HOSPITAL_COMMUNITY): Payer: Medicare Other

## 2020-08-25 DIAGNOSIS — Z992 Dependence on renal dialysis: Secondary | ICD-10-CM | POA: Diagnosis not present

## 2020-08-25 DIAGNOSIS — N186 End stage renal disease: Secondary | ICD-10-CM | POA: Diagnosis not present

## 2020-08-26 DIAGNOSIS — N186 End stage renal disease: Secondary | ICD-10-CM | POA: Diagnosis not present

## 2020-08-26 DIAGNOSIS — Z992 Dependence on renal dialysis: Secondary | ICD-10-CM | POA: Diagnosis not present

## 2020-08-27 ENCOUNTER — Ambulatory Visit (HOSPITAL_COMMUNITY): Payer: Medicare Other

## 2020-08-27 DIAGNOSIS — N186 End stage renal disease: Secondary | ICD-10-CM | POA: Diagnosis not present

## 2020-08-27 DIAGNOSIS — Z992 Dependence on renal dialysis: Secondary | ICD-10-CM | POA: Diagnosis not present

## 2020-08-28 DIAGNOSIS — N186 End stage renal disease: Secondary | ICD-10-CM | POA: Diagnosis not present

## 2020-08-28 DIAGNOSIS — Z992 Dependence on renal dialysis: Secondary | ICD-10-CM | POA: Diagnosis not present

## 2020-08-29 DIAGNOSIS — N186 End stage renal disease: Secondary | ICD-10-CM | POA: Diagnosis not present

## 2020-08-29 DIAGNOSIS — Z992 Dependence on renal dialysis: Secondary | ICD-10-CM | POA: Diagnosis not present

## 2020-08-30 ENCOUNTER — Telehealth: Payer: Self-pay

## 2020-08-30 ENCOUNTER — Ambulatory Visit (HOSPITAL_COMMUNITY): Payer: Medicare Other

## 2020-08-30 DIAGNOSIS — Z992 Dependence on renal dialysis: Secondary | ICD-10-CM | POA: Diagnosis not present

## 2020-08-30 DIAGNOSIS — N186 End stage renal disease: Secondary | ICD-10-CM | POA: Diagnosis not present

## 2020-08-30 NOTE — Telephone Encounter (Signed)
Contacted pt to go over lab results pt is aware and doesn't have any questions or concerns 

## 2020-08-31 DIAGNOSIS — N186 End stage renal disease: Secondary | ICD-10-CM | POA: Diagnosis not present

## 2020-08-31 DIAGNOSIS — Z992 Dependence on renal dialysis: Secondary | ICD-10-CM | POA: Diagnosis not present

## 2020-09-01 ENCOUNTER — Ambulatory Visit (HOSPITAL_COMMUNITY): Payer: Medicare Other

## 2020-09-01 DIAGNOSIS — Z992 Dependence on renal dialysis: Secondary | ICD-10-CM | POA: Diagnosis not present

## 2020-09-01 DIAGNOSIS — N186 End stage renal disease: Secondary | ICD-10-CM | POA: Diagnosis not present

## 2020-09-02 DIAGNOSIS — N186 End stage renal disease: Secondary | ICD-10-CM | POA: Diagnosis not present

## 2020-09-02 DIAGNOSIS — Z992 Dependence on renal dialysis: Secondary | ICD-10-CM | POA: Diagnosis not present

## 2020-09-03 ENCOUNTER — Ambulatory Visit (HOSPITAL_COMMUNITY): Payer: Medicare Other

## 2020-09-03 DIAGNOSIS — N186 End stage renal disease: Secondary | ICD-10-CM | POA: Diagnosis not present

## 2020-09-03 DIAGNOSIS — Z992 Dependence on renal dialysis: Secondary | ICD-10-CM | POA: Diagnosis not present

## 2020-09-04 DIAGNOSIS — N186 End stage renal disease: Secondary | ICD-10-CM | POA: Diagnosis not present

## 2020-09-04 DIAGNOSIS — Z992 Dependence on renal dialysis: Secondary | ICD-10-CM | POA: Diagnosis not present

## 2020-09-05 DIAGNOSIS — Z992 Dependence on renal dialysis: Secondary | ICD-10-CM | POA: Diagnosis not present

## 2020-09-05 DIAGNOSIS — N186 End stage renal disease: Secondary | ICD-10-CM | POA: Diagnosis not present

## 2020-09-06 DIAGNOSIS — Z992 Dependence on renal dialysis: Secondary | ICD-10-CM | POA: Diagnosis not present

## 2020-09-06 DIAGNOSIS — N186 End stage renal disease: Secondary | ICD-10-CM | POA: Diagnosis not present

## 2020-09-06 NOTE — Progress Notes (Deleted)
Cardiology Office Note:    Date:  09/06/2020   ID:  ODINN BEVIER, DOB 03/16/1971, MRN [id number]  PCP:  Ladell Pier, MD  Cardiologist:  None  Electrophysiologist:  None   Referring MD: Ladell Pier, MD   No chief complaint on file.   History of Present Illness:    Donald [name] is a 50 y.o. male with a hx of CAD status post CABG (in 2016 in [place]: LIMA-LAD, SVG-OM1, SVG-RPDA), ESRD on peritoneal dialysis, T2DM, left toe amputation, GI bleeding, hypertension, hyperlipidemia who presents for hospital follow-up.  He was admitted to [facility] from 2/21 through 06/03/2020 after presenting with chest pain.  Troponin elevation to 219.  Echo 04/11/2020 showed EF 35 to 40% with inferior hypokinesis, grade 1 diastolic dysfunction, moderate RV dysfunction.  Cath on 06/03/2020 showed severe native three-vessel CAD, patent LIMA-LAD and SVG-OM1, patent SVG-RPDA but total occlusion of PDA and PLA branches beyond the graft insertion site.  Medical therapy was recommended.  Since last clinic visit,  discharge from the hospital, he reports that he is doing well.  He denies any chest pain.  Does report dyspnea sometimes at night.  Reports he has been walking 2-3 times per week for up to 30 minutes.  Denies any exertional chest pain or dyspnea.  Reports occasional lightheadedness but denies any syncope.  Denies any palpitations or lower extremity edema.  Reports has been taking DAPT, denies any bleeding issues.   Past Medical History:  Diagnosis Date  . Diabetes mellitus without complication ([place])   . History of vitrectomy 2014   anterior, left  . Hx of heart bypass surgery   . Hypertension   . Renal disorder    dialysis     Past Surgical History:  Procedure Laterality Date  . AV FISTULA PLACEMENT    . CARDIAC SURGERY    . COLONOSCOPY WITH PROPOFOL N/A 02/11/2020   Procedure: COLONOSCOPY WITH PROPOFOL;  Surgeon: Toledo, Benay Pike, MD;  Location: [facility] ENDOSCOPY;  Service:  Gastroenterology;  Laterality: N/A;  . CORONARY ARTERY BYPASS GRAFT  2016  . ESOPHAGOGASTRODUODENOSCOPY (EGD) WITH PROPOFOL N/A 01/02/2020   Procedure: ESOPHAGOGASTRODUODENOSCOPY (EGD) WITH PROPOFOL;  Surgeon: Lesly Rubenstein, MD;  Location: [facility] ENDOSCOPY;  Service: Endoscopy;  Laterality: N/A;  . INSERTION OF DIALYSIS CATHETER    . left great toe amputation    . LEFT HEART CATH AND CORS/GRAFTS ANGIOGRAPHY N/A 06/03/2020   Procedure: LEFT HEART CATH AND CORS/GRAFTS ANGIOGRAPHY;  Surgeon: Sherren Mocha, MD;  Location: [place] [facility];  Service: Cardiovascular;  Laterality: N/A;  . VITRECTOMY Left 2014    Current Medications: No outpatient medications have been marked as taking for the 09/09/20 encounter (Appointment) with Donato Heinz, MD.     Allergies:   Lisinopril and Other   Social History   Socioeconomic History  . Marital status: Married    Spouse name: Not on file  . Number of children: 6  . Years of education: Not on file  . Highest education level: Some college, no degree  Occupational History  . Not on file  Tobacco Use  . Smoking status: Never Smoker  . Smokeless tobacco: Never Used  Vaping Use  . Vaping Use: Never used  Substance and Sexual Activity  . Alcohol use: Yes    Comment: occasionally  . Drug use: Yes    Types: Marijuana    Comment: occasionally  . Sexual activity: Not on file  Other Topics Concern  . Not on file  Social History Narrative  . Not on file   Social Determinants of Health   Financial Resource Strain: Not on file  Food Insecurity: Not on file  Transportation Needs: Not on file  Physical Activity: Not on file  Stress: Not on file  Social Connections: Not on file     Family History: The patient's family history includes Dementia in his maternal grandmother; Diabetes in his maternal grandmother and mother; Diabetes Mellitus II in his mother; Heart disease in his mother; Hyperlipidemia in his maternal grandmother;  Hypertension in his mother; Kidney failure in his mother.  ROS:   Please see the history of present illness.     All other systems reviewed and are negative.  EKGs/Labs/Other Studies Reviewed:    The following studies were reviewed today:   EKG:  EKG is ordered today.  The ekg ordered today demonstrates normal sinus rhythm, rate 86, QTc 495, LVH with reports patient abnormalities  Recent Labs: 04/08/2020: ALT 32 06/02/2020: Magnesium 2.0 07/29/2020: Hemoglobin 10.0; Platelets 226 08/23/2020: BUN 73; Creatinine, Ser 17.51; Potassium 5.2; Sodium 136  Recent Lipid Panel    Component Value Date/Time   CHOL 134 07/14/2019 2209   TRIG 127 07/14/2019 2209   HDL 37 (L) 07/14/2019 2209   CHOLHDL 3.6 07/14/2019 2209   VLDL 25 07/14/2019 2209   LDLCALC 72 07/14/2019 2209    Physical Exam:    VS:  There were no vitals taken for this visit.    Wt Readings from Last 3 Encounters:  08/23/20 243 lb 6.4 oz (110.4 kg)  07/29/20 234 lb (106.1 kg)  06/08/20 239 lb 3.2 oz (108.5 kg)     GEN: in no acute distress HEENT: Normal NECK: No JVD; No carotid bruits CARDIAC: RRR, no murmurs, rubs, gallops RESPIRATORY:  Clear to auscultation without rales, wheezing or rhonchi  ABDOMEN: Soft, non-tender, non-distended MUSCULOSKELETAL:  No edema; No deformity  SKIN: Warm and dry NEUROLOGIC:  Alert and oriented x 3 PSYCHIATRIC:  Normal affect   ASSESSMENT:    No diagnosis found. PLAN:     CAD: status post CABG (in 2016 in [place]: LIMA-LAD, SVG-OM1, SVG-RPDA).  Presented to [facility] with NSTEMI 05/31/2020. Echo 04/11/2020 showed EF 35 to 40% with inferior hypokinesis, grade 1 diastolic dysfunction, moderate RV dysfunction.  Cath on 06/03/2020 showed severe native three-vessel CAD, patent LIMA-LAD and SVG-OM1, patent SVG-RPDA but total occlusion of PDA and PLA branches beyond the graft insertion site.  Medical therapy was recommended.  Currently denies any anginal symptoms -Continue aspirin 81 mg  daily, Plavix 75 mg -Continue rosuvastatin -Cardiac rehab  Chronic combined systolic and diastolic heart failure: Echo 04/11/2020 showed EF 35 to 40% with inferior hypokinesis, grade 1 diastolic dysfunction, moderate RV dysfunction.  Ischemic cardiomyopathy as above -Continue carvedilol 25 mg twice daily -Continue Lasix 80 mg twice daily and volume removal via peritoneal dialysis -Continue hydralazine 20 mg 3 times daily/Isordil 20 mg 3 times daily  Hypertension: Continue carvedilol, Isordil/hydralazine as above  T2DM: On insulin.  A1c 6.4%.  Hyperlipidemia: Continue rosuvastatin.  LDL 72 on 07/14/2018  ESRD: On peritoneal dialysis  Snoring: Check sleep study  RTC in***   Medication Adjustments/Labs and Tests Ordered: Current medicines are reviewed at length with the patient today.  Concerns regarding medicines are outlined above.  No orders of the defined types were placed in this encounter.  No orders of the defined types were placed in this encounter.   There are no Patient Instructions on file for this visit.  Signed, Donato Heinz, MD  09/06/2020 9:11 PM    [facility]

## 2020-09-07 DIAGNOSIS — Z992 Dependence on renal dialysis: Secondary | ICD-10-CM | POA: Diagnosis not present

## 2020-09-07 DIAGNOSIS — N186 End stage renal disease: Secondary | ICD-10-CM | POA: Diagnosis not present

## 2020-09-08 ENCOUNTER — Ambulatory Visit (HOSPITAL_COMMUNITY): Payer: Medicare Other

## 2020-09-08 DIAGNOSIS — Z992 Dependence on renal dialysis: Secondary | ICD-10-CM | POA: Diagnosis not present

## 2020-09-08 DIAGNOSIS — N186 End stage renal disease: Secondary | ICD-10-CM | POA: Diagnosis not present

## 2020-09-09 ENCOUNTER — Ambulatory Visit (INDEPENDENT_AMBULATORY_CARE_PROVIDER_SITE_OTHER): Payer: Medicare Other | Admitting: Cardiology

## 2020-09-09 ENCOUNTER — Encounter: Payer: Self-pay | Admitting: Cardiology

## 2020-09-09 ENCOUNTER — Other Ambulatory Visit: Payer: Self-pay

## 2020-09-09 VITALS — BP 136/68 | HR 85 | Ht 70.0 in | Wt 232.6 lb

## 2020-09-09 DIAGNOSIS — E785 Hyperlipidemia, unspecified: Secondary | ICD-10-CM | POA: Diagnosis not present

## 2020-09-09 DIAGNOSIS — I25810 Atherosclerosis of coronary artery bypass graft(s) without angina pectoris: Secondary | ICD-10-CM

## 2020-09-09 DIAGNOSIS — I5042 Chronic combined systolic (congestive) and diastolic (congestive) heart failure: Secondary | ICD-10-CM

## 2020-09-09 DIAGNOSIS — Z992 Dependence on renal dialysis: Secondary | ICD-10-CM | POA: Diagnosis not present

## 2020-09-09 DIAGNOSIS — N186 End stage renal disease: Secondary | ICD-10-CM | POA: Diagnosis not present

## 2020-09-09 DIAGNOSIS — I10 Essential (primary) hypertension: Secondary | ICD-10-CM | POA: Diagnosis not present

## 2020-09-09 LAB — LIPID PANEL
Chol/HDL Ratio: 3.5 ratio (ref 0.0–5.0)
Cholesterol, Total: 152 mg/dL (ref 100–199)
HDL: 43 mg/dL (ref >39)
LDL Chol Calc (NIH): 82 mg/dL (ref 0–99)
Triglycerides: 154 mg/dL — ABNORMAL HIGH (ref 0–149)
VLDL Cholesterol Cal: 27 mg/dL (ref 5–40)

## 2020-09-09 MED ORDER — CARVEDILOL 12.5 MG PO TABS: 12.5000 mg | ORAL_TABLET | Freq: Two times a day (BID) | ORAL | 3 refills | Status: AC

## 2020-09-09 NOTE — Progress Notes (Signed)
Donald [name]:    Date:  09/09/2020   ID:  Donald [name], DOB 22-Sep-1970, MRN [id number]  PCP:  Donald Pier, Donald [name]  Cardiologist:  Donald [name]  Electrophysiologist:  Donald [name]   Referring Donald [name]: Donald Pier, Donald [name]   Chief Complaint  Patient presents with  . Congestive Heart Failure    History of Present Illness:    Donald [name] is a 50 y.o. male with a hx of CAD status post CABG (in 2016 in [place]: LIMA-LAD, SVG-OM1, SVG-RPDA), ESRD on peritoneal dialysis, T2DM, left toe amputation, GI bleeding, hypertension, hyperlipidemia who presents for hospital follow-up.  He was admitted to [facility] from 2/21 through 06/03/2020 after presenting with chest pain.  Troponin elevation to 219.  Echo 04/11/2020 showed EF 35 to 40% with inferior hypokinesis, grade 1 diastolic dysfunction, moderate RV dysfunction.  Cath on 06/03/2020 showed severe native three-vessel CAD, patent LIMA-LAD and SVG-OM1, patent SVG-RPDA but total occlusion of PDA and PLA branches beyond the graft insertion site.  Medical therapy was recommended.  Today, he is doing well. He states that his Nephrologist decreased his Isosorbide medication to twice a day and his Carvedilol medication to once a day because his blood pressure readings were low. He states that when he would sit down his blood pressure would read 114/59 and when he would stand his systolic readings would drop to the 90 range. When his blood pressure was low he also experienced lightheadedness and occasional SOB. He states that he walks for exercise but the lightheadedness makes it difficult because he also experiences fatigue. When he is not experiencing lightheadedness, he is able to walk a mile. He denies any chest pain, palpitations, syncopal episodes, hematochezia, or lower extremity edema.       Past Medical History:  Diagnosis Date  . Diabetes mellitus without complication ([place])   . History of vitrectomy 2014   anterior, left  . Hx of heart bypass surgery    . Hypertension   . Renal disorder    dialysis     Past Surgical History:  Procedure Laterality Date  . AV FISTULA PLACEMENT    . CARDIAC SURGERY    . COLONOSCOPY WITH PROPOFOL N/A 02/11/2020   Procedure: COLONOSCOPY WITH PROPOFOL;  Surgeon: Donald [name], Donald Pike, Donald [name];  Location: [facility] ENDOSCOPY;  Service: Gastroenterology;  Laterality: N/A;  . CORONARY ARTERY BYPASS GRAFT  2016  . ESOPHAGOGASTRODUODENOSCOPY (EGD) WITH PROPOFOL N/A 01/02/2020   Procedure: ESOPHAGOGASTRODUODENOSCOPY (EGD) WITH PROPOFOL;  Surgeon: Donald Rubenstein, Donald [name];  Location: [facility] ENDOSCOPY;  Service: Endoscopy;  Laterality: N/A;  . INSERTION OF DIALYSIS CATHETER    . left great toe amputation    . LEFT HEART CATH AND CORS/GRAFTS ANGIOGRAPHY N/A 06/03/2020   Procedure: LEFT HEART CATH AND CORS/GRAFTS ANGIOGRAPHY;  Surgeon: Donald Mocha, Donald [name];  Location: [place] [facility];  Service: Cardiovascular;  Laterality: N/A;  . VITRECTOMY Left 2014    Current Medications: Current Meds  Medication Sig  . amoxicillin (AMOXIL) 500 MG capsule Take 500 mg by mouth 3 (three) times daily.  [name] aspirin EC 81 MG EC tablet Take 1 tablet (81 mg total) by mouth daily.  . calcium acetate (PHOSLO) 667 MG capsule Take 667-2,001 mg by mouth See admin instructions. Take 3 capsules by mouth with each meal & take 1 capsule with snacks  . cinacalcet (SENSIPAR) 30 MG tablet Take by mouth daily.  . clopidogrel (PLAVIX) 75 MG tablet Take 1 tablet (75 mg total) by mouth daily.  . Continuous Blood  Gluc Sensor (FREESTYLE LIBRE SENSOR SYSTEM) MISC Use to check blood sugar at least 4 times daily. Change sensor Q 2 wks  . gentamicin cream (GARAMYCIN) 0.1 % Apply 1 application topically every morning. Apply to exit site every day  . insulin aspart (NOVOLOG) 100 UNIT/ML FlexPen Inject 6 Units into the skin 3 (three) times daily with meals.  . insulin glargine (LANTUS SOLOSTAR) 100 UNIT/ML Solostar Pen Inject 20 Units into the skin daily. (Patient taking  differently: Inject 20 Units into the skin every evening.)  . Insulin Pen Needle (TRUEPLUS PEN NEEDLES) 31G X 6 MM MISC Use to inject Novolog TID and Lantus once daily. Total injections in 1 day = 4.  . oxyCODONE (ROXICODONE) 5 MG immediate release tablet Take 1 tablet (5 mg total) by mouth every 4 (four) hours as needed for severe pain.  [name] Peritoneal Dialysis Solutions (DIALYSIS SOLUTION 2.5% LOW-MG/LOW-CA) 394 MOSM/L SOLN dianeal solution As per nephro  . rosuvastatin (CRESTOR) 40 MG tablet Take 40 mg by mouth in the morning.  . senna (SENOKOT) 8.6 MG tablet Take 2 tablets (17.2 mg total) by mouth daily. (Patient taking differently: Take 2 tablets by mouth daily as needed for constipation.)  . [DISCONTINUED] calcitRIOL (ROCALTROL) 0.5 MCG capsule Take 2 mcg by mouth in the morning.  . [DISCONTINUED] carvedilol (COREG) 25 MG tablet Take 25 mg by mouth daily.     Allergies:   Lisinopril and Other   Social History   Socioeconomic History  . Marital status: Married    Spouse name: Not on file  . Number of children: 6  . Years of education: Not on file  . Highest education level: Some college, no degree  Occupational History  . Not on file  Tobacco Use  . Smoking status: Never Smoker  . Smokeless tobacco: Never Used  Vaping Use  . Vaping Use: Never used  Substance and Sexual Activity  . Alcohol use: Yes    Comment: occasionally  . Drug use: Yes    Types: Marijuana    Comment: occasionally  . Sexual activity: Not on file  Other Topics Concern  . Not on file  Social History Narrative  . Not on file   Social Determinants of Health   Financial Resource Strain: Not on file  Food Insecurity: Not on file  Transportation Needs: Not on file  Physical Activity: Not on file  Stress: Not on file  Social Connections: Not on file     Family History: The patient's family history includes Dementia in his maternal grandmother; Diabetes in his maternal grandmother and mother; Diabetes  Mellitus II in his mother; Heart disease in his mother; Hyperlipidemia in his maternal grandmother; Hypertension in his mother; Kidney failure in his mother.  ROS:   Please see the history of present illness.     All other systems reviewed and are negative.  EKGs/Labs/Other Studies Reviewed:    The following studies were reviewed today:   EKG:  09/09/2020- The EKG ordered demonstrates sinus rhythm, PACs, LVH with repolarization abnormalities, QTc 497, rate 85  06/08/2020- The ekg ordered demonstrates normal sinus rhythm, rate 86, QTc 495, LVH with reports patient abnormalities    Recent Labs: 04/08/2020: ALT 32 06/02/2020: Magnesium 2.0 07/29/2020: Hemoglobin 10.0; Platelets 226 08/23/2020: BUN 73; Creatinine, Ser 17.51; Potassium 5.2; Sodium 136  Recent Lipid Panel    Component Value Date/Time   CHOL 134 07/14/2019 2209   TRIG 127 07/14/2019 2209   HDL 37 (L) 07/14/2019 2209   CHOLHDL  3.6 07/14/2019 2209   VLDL 25 07/14/2019 2209   LDLCALC 72 07/14/2019 2209    Physical Exam:    VS:  BP 136/68 (BP Location: Right Arm, Patient Position: Sitting)   Pulse 85   Ht '5\' 10"'$  (1.778 m)   Wt 232 lb 9.6 oz (105.5 kg)   SpO2 97%   BMI 33.37 kg/m     Wt Readings from Last 3 Encounters:  09/09/20 232 lb 9.6 oz (105.5 kg)  08/23/20 243 lb 6.4 oz (110.4 kg)  07/29/20 234 lb (106.1 kg)     GEN: in no acute distress HEENT: Normal NECK: No JVD; No carotid bruits CARDIAC: RRR, no murmurs, rubs, gallops RESPIRATORY:  Clear to auscultation without rales, wheezing or rhonchi  ABDOMEN: Soft, non-tender, non-distended MUSCULOSKELETAL:  No edema; No deformity  SKIN: Warm and dry NEUROLOGIC:  Alert and oriented x 3 PSYCHIATRIC:  Normal affect   ASSESSMENT:    1. Chronic combined systolic and diastolic heart failure ([place])   2. Coronary artery disease involving coronary bypass graft of native heart without angina pectoris   3. Essential hypertension   4. Hyperlipidemia, unspecified  hyperlipidemia type    PLAN:      CAD: status post CABG (in 2016 in [place]: LIMA-LAD, SVG-OM1, SVG-RPDA).  Presented to [facility] with NSTEMI 05/31/2020. Echo 04/11/2020 showed EF 35 to 40% with inferior hypokinesis, grade 1 diastolic dysfunction, moderate RV dysfunction.  Cath on 06/03/2020 showed severe native three-vessel CAD, patent LIMA-LAD and SVG-OM1, patent SVG-RPDA but total occlusion of PDA and PLA branches beyond the graft insertion site.  Medical therapy was recommended.  Currently denies any anginal symptoms -Continue aspirin 81 mg daily, Plavix 75 mg -Continue rosuvastatin -Planning to do cardiac rehab -Saw cardiologist at [facility] as part of kidney transplant evaluation, planning stress echo in July  Chronic combined systolic and diastolic heart failure: Echo 04/11/2020 showed EF 35 to 40% with inferior hypokinesis, grade 1 diastolic dysfunction, moderate RV dysfunction.  Ischemic cardiomyopathy as above -Recent issues with a low BP.  Carvedilol dose was decreased to 25 mg daily by his nephrologist.  Recommend changing to 12.5 mg twice daily.  His hydralazine was discontinued and Isordil decreased to twice daily.  Will obtain records from nephrology -Volume removal via peritoneal dialysis, appears euvolemic  Hypertension: Continue carvedilol, Isordil as above  T2DM: On insulin.  A1c 6.4%.  Hyperlipidemia: Continue rosuvastatin.  LDL 72 on 07/14/2019, will recheck lipid  ESRD: On peritoneal dialysis  Snoring: Check sleep study  RTC in 6 months    Medication Adjustments/Labs and Tests Ordered: Current medicines are reviewed at length with the patient today.  Concerns regarding medicines are outlined above.  Orders Placed This Encounter  Procedures  . Lipid panel  . EKG 12-Lead   Meds ordered this encounter  Medications  . carvedilol (COREG) 12.5 MG tablet    Sig: Take 1 tablet (12.5 mg total) by mouth 2 (two) times daily with a meal.    Dispense:  180 tablet    Refill:  3     rx change    Patient Instructions  Medication Instructions:  CHANGE carvedilol (Coreg) to 12.5 mg two times daily  *If you need a refill on your cardiac medications before your next appointment, please call your pharmacy*   Lab Work: Lipid today  If you have labs (blood work) drawn today and your tests are completely normal, you will receive your results only by: [name] MyChart Message (if you have MyChart) OR . A  paper copy in the mail If you have any lab test that is abnormal or we need to change your treatment, we will call you to review the results.  Follow-Up: At [facility], you and your health needs are our priority.  As part of our continuing mission to provide you with exceptional heart care, we have created designated Provider Care Teams.  These Care Teams include your primary Cardiologist (physician) and Advanced Practice Providers (APPs -  Physician Assistants and Nurse Practitioners) who all work together to provide you with the care you need, when you need it.  We recommend signing up for the patient portal called "MyChart".  Sign up information is provided on this After Visit Summary.  MyChart is used to connect with patients for Virtual Visits (Telemedicine).  Patients are able to view lab/test results, encounter notes, upcoming appointments, etc.  Non-urgent messages can be sent to your provider as well.   To learn more about what you can do with MyChart, go to [email or web address].    Your next appointment:   6 month(s)  The format for your next appointment:   In Person  Provider:   Oswaldo Milian, Donald [name]       Ella Bodo as a scribe for Donato Heinz, Donald [name].,have documented all relevant documentation on the behalf of Donato Heinz, Donald [name],as directed by  Donato Heinz, Donald [name] while in the presence of Donato Heinz, Donald [name].  I, Donato Heinz, Donald [name], have reviewed all documentation for this visit. The  documentation on 09/09/20 for the exam, diagnosis, procedures, and orders are all accurate and complete.   Signed, Donato Heinz, Donald [name]  09/09/2020 1:46 PM    [facility]

## 2020-09-09 NOTE — Patient Instructions (Signed)
Medication Instructions:  CHANGE carvedilol (Coreg) to 12.5 mg two times daily  *If you need a refill on your cardiac medications before your next appointment, please call your pharmacy*   Lab Work: Lipid today  If you have labs (blood work) drawn today and your tests are completely normal, you will receive your results only by: [name] MyChart Message (if you have MyChart) OR . A paper copy in the mail If you have any lab test that is abnormal or we need to change your treatment, we will call you to review the results.  Follow-Up: At [facility], you and your health needs are our priority.  As part of our continuing mission to provide you with exceptional heart care, we have created designated Provider Care Teams.  These Care Teams include your primary Cardiologist (physician) and Advanced Practice Providers (APPs -  Physician Assistants and Nurse Practitioners) who all work together to provide you with the care you need, when you need it.  We recommend signing up for the patient portal called "MyChart".  Sign up information is provided on this After Visit Summary.  MyChart is used to connect with patients for Virtual Visits (Telemedicine).  Patients are able to view lab/test results, encounter notes, upcoming appointments, etc.  Non-urgent messages can be sent to your provider as well.   To learn more about what you can do with MyChart, go to [email or web address].    Your next appointment:   6 month(s)  The format for your next appointment:   In Person  Provider:   Oswaldo Milian, MD

## 2020-09-10 ENCOUNTER — Ambulatory Visit (HOSPITAL_COMMUNITY): Payer: Medicare Other

## 2020-09-10 DIAGNOSIS — N186 End stage renal disease: Secondary | ICD-10-CM | POA: Diagnosis not present

## 2020-09-10 DIAGNOSIS — Z992 Dependence on renal dialysis: Secondary | ICD-10-CM | POA: Diagnosis not present

## 2020-09-11 DIAGNOSIS — N186 End stage renal disease: Secondary | ICD-10-CM | POA: Diagnosis not present

## 2020-09-11 DIAGNOSIS — Z992 Dependence on renal dialysis: Secondary | ICD-10-CM | POA: Diagnosis not present

## 2020-09-12 DIAGNOSIS — N186 End stage renal disease: Secondary | ICD-10-CM | POA: Diagnosis not present

## 2020-09-12 DIAGNOSIS — Z992 Dependence on renal dialysis: Secondary | ICD-10-CM | POA: Diagnosis not present

## 2020-09-13 ENCOUNTER — Ambulatory Visit (HOSPITAL_COMMUNITY): Payer: Medicare Other

## 2020-09-13 ENCOUNTER — Other Ambulatory Visit: Payer: Self-pay

## 2020-09-13 ENCOUNTER — Ambulatory Visit: Payer: Medicare Other | Attending: Internal Medicine | Admitting: Pharmacist

## 2020-09-13 ENCOUNTER — Encounter: Payer: Self-pay | Admitting: Pharmacist

## 2020-09-13 ENCOUNTER — Other Ambulatory Visit (INDEPENDENT_AMBULATORY_CARE_PROVIDER_SITE_OTHER): Payer: Self-pay | Admitting: Nurse Practitioner

## 2020-09-13 VITALS — Ht 70.0 in | Wt 235.0 lb

## 2020-09-13 DIAGNOSIS — Z0000 Encounter for general adult medical examination without abnormal findings: Secondary | ICD-10-CM

## 2020-09-13 DIAGNOSIS — N186 End stage renal disease: Secondary | ICD-10-CM | POA: Diagnosis not present

## 2020-09-13 DIAGNOSIS — Z23 Encounter for immunization: Secondary | ICD-10-CM

## 2020-09-13 DIAGNOSIS — Z992 Dependence on renal dialysis: Secondary | ICD-10-CM | POA: Diagnosis not present

## 2020-09-13 MED ORDER — TRUEPLUS PEN NEEDLES 31G X 6 MM MISC: 6 refills | Status: AC

## 2020-09-13 NOTE — Progress Notes (Signed)
Subjective:   Donald [name] is a 50 y.o. male who presents for an Initial Medicare Annual Wellness Visit.  Objective:    Today's Vitals   09/13/20 1621 09/13/20 1622  Weight: 235 lb (106.6 kg)   Height: '5\' 10"'$  (1.778 m)   PainSc: 0-No pain 0-No pain   Body mass index is 33.72 kg/m.  Advanced Directives 09/13/2020 06/02/2020 02/11/2020 01/26/2020 01/02/2020 12/31/2019 12/31/2019  Does Patient Have a Medical Advance Directive? Yes No No No No No No  Type of Advance Directive [place]  Does patient want to make changes to medical advance directive? No - Patient declined - - - - - -  Copy of [place] in Chart? No - copy requested - - - - - -  Would patient like information on creating a medical advance directive? - No - Patient declined - - No - Patient declined No - Patient declined -    Current Medications (verified) Outpatient Encounter Medications as of 09/13/2020  Medication Sig  . aspirin EC 81 MG EC tablet Take 1 tablet (81 mg total) by mouth daily.  . calcium acetate (PHOSLO) 667 MG capsule Take 667-2,001 mg by mouth See admin instructions. Take 3 capsules by mouth with each meal & take 1 capsule with snacks  . carvedilol (COREG) 12.5 MG tablet Take 1 tablet (12.5 mg total) by mouth 2 (two) times daily with a meal.  . cinacalcet (SENSIPAR) 30 MG tablet Take by mouth daily.  . clopidogrel (PLAVIX) 75 MG tablet Take 1 tablet (75 mg total) by mouth daily.  . Continuous Blood Gluc Sensor (FREESTYLE LIBRE SENSOR SYSTEM) MISC Use to check blood sugar at least 4 times daily. Change sensor Q 2 wks  . gentamicin cream (GARAMYCIN) 0.1 % Apply 1 application topically every morning. Apply to exit site every day  . insulin aspart (NOVOLOG) 100 UNIT/ML FlexPen Inject 6 Units into the skin 3 (three) times daily with meals.  . insulin glargine (LANTUS SOLOSTAR) 100 UNIT/ML Solostar Pen Inject 20 Units into the skin daily. (Patient taking  differently: Inject 20 Units into the skin every evening.)  . Insulin Pen Needle (TRUEPLUS PEN NEEDLES) 31G X 6 MM MISC Use to inject Novolog TID and Lantus once daily. Total injections in 1 day = 4.  . Peritoneal Dialysis Solutions (DIALYSIS SOLUTION 2.5% LOW-MG/LOW-CA) 394 MOSM/L SOLN dianeal solution As per nephro  . rosuvastatin (CRESTOR) 40 MG tablet Take 40 mg by mouth in the morning.  . senna (SENOKOT) 8.6 MG tablet Take 2 tablets (17.2 mg total) by mouth daily. (Patient taking differently: Take 2 tablets by mouth daily as needed for constipation.)  . oxyCODONE (ROXICODONE) 5 MG immediate release tablet Take 1 tablet (5 mg total) by mouth every 4 (four) hours as needed for severe pain.  . [DISCONTINUED] amoxicillin (AMOXIL) 500 MG capsule Take 500 mg by mouth 3 (three) times daily.  . [DISCONTINUED] HYDROcodone-acetaminophen (NORCO/VICODIN) 5-325 MG tablet Take by mouth as needed. (Patient not taking: Reported on 09/09/2020)  . [DISCONTINUED] Insulin Pen Needle (TRUEPLUS PEN NEEDLES) 31G X 6 MM MISC Use to inject Novolog TID and Lantus once daily. Total injections in 1 day = 4.   Facility-Administered Encounter Medications as of 09/13/2020  Medication  . 0.9 %  sodium chloride infusion  . ceFAZolin (ANCEF) IVPB 1 g/50 mL premix  . diphenhydrAMINE (BENADRYL) injection 50 mg  . famotidine (PEPCID) tablet 40 mg  .  methylPREDNISolone sodium succinate (SOLU-MEDROL) 125 mg/2 mL injection 125 mg  . midazolam (VERSED) 2 MG/ML syrup 8 mg    Allergies (verified) Other and Lisinopril   History: Past Medical History:  Diagnosis Date  . Diabetes mellitus without complication ([place])   . History of vitrectomy 2014   anterior, left  . Hx of heart bypass surgery   . Hypertension   . Renal disorder    dialysis    Past Surgical History:  Procedure Laterality Date  . AV FISTULA PLACEMENT    . CARDIAC SURGERY    . COLONOSCOPY WITH PROPOFOL N/A 02/11/2020   Procedure: COLONOSCOPY WITH PROPOFOL;   Surgeon: Toledo, Benay Pike, MD;  Location: [facility] ENDOSCOPY;  Service: Gastroenterology;  Laterality: N/A;  . CORONARY ARTERY BYPASS GRAFT  2016  . ESOPHAGOGASTRODUODENOSCOPY (EGD) WITH PROPOFOL N/A 01/02/2020   Procedure: ESOPHAGOGASTRODUODENOSCOPY (EGD) WITH PROPOFOL;  Surgeon: Lesly Rubenstein, MD;  Location: [facility] ENDOSCOPY;  Service: Endoscopy;  Laterality: N/A;  . INSERTION OF DIALYSIS CATHETER    . left great toe amputation    . LEFT HEART CATH AND CORS/GRAFTS ANGIOGRAPHY N/A 06/03/2020   Procedure: LEFT HEART CATH AND CORS/GRAFTS ANGIOGRAPHY;  Surgeon: Sherren Mocha, MD;  Location: [place] [facility];  Service: Cardiovascular;  Laterality: N/A;  . VITRECTOMY Left 2014   Family History  Problem Relation Age of Onset  . Diabetes Mellitus II Mother   . Heart disease Mother   . Diabetes Mother   . Hypertension Mother   . Kidney failure Mother   . Diabetes Maternal Grandmother   . Hyperlipidemia Maternal Grandmother   . Dementia Maternal Grandmother    Social History   Socioeconomic History  . Marital status: Married    Spouse name: Not on file  . Number of children: 6  . Years of education: Not on file  . Highest education level: Some college, no degree  Occupational History  . Not on file  Tobacco Use  . Smoking status: Never Smoker  . Smokeless tobacco: Never Used  Vaping Use  . Vaping Use: Never used  Substance and Sexual Activity  . Alcohol use: Yes    Comment: occasionally  . Drug use: Yes    Types: Marijuana    Comment: occasionally  . Sexual activity: Not on file  Other Topics Concern  . Not on file  Social History Narrative  . Not on file   Social Determinants of Health   Financial Resource Strain: Low Risk   . Difficulty of Paying Living Expenses: Not very hard  Food Insecurity: No Food Insecurity  . Worried About Charity fundraiser in the Last Year: Never true  . Ran Out of Food in the Last Year: Never true  Transportation Needs: No Transportation  Needs  . Lack of Transportation (Medical): No  . Lack of Transportation (Non-Medical): No  Physical Activity: Insufficiently Active  . Days of Exercise per Week: 3 days  . Minutes of Exercise per Session: 30 min  Stress: No Stress Concern Present  . Feeling of Stress : Only a little  Social Connections: Unknown  . Frequency of Communication with Friends and Family: Three times a week  . Frequency of Social Gatherings with Friends and Family: Three times a week  . Attends Religious Services: Not on file  . Active Member of Clubs or Organizations: Not on file  . Attends Archivist Meetings: Not on file  . Marital Status: Not on file    Tobacco Counseling Counseling given:  Not Answered   Clinical Intake:  Pre-visit preparation completed: No  Pain : No/denies pain Pain Score: 0-No pain  BMI - recorded: 33.72 Nutritional Status: BMI > 30  Obese Diabetes: Yes CBG done?: No CBG resulted in Enter/ Edit results?: No Did pt. bring in CBG monitor from home?: No  How often do you need to have someone help you when you read instructions, pamphlets, or other written materials from your doctor or pharmacy?: 1 - Never  Diabetic? Yes  Interpreter Needed?: No   Activities of Daily Living In your present state of health, do you have any difficulty performing the following activities: 09/13/2020 06/02/2020  Hearing? N N  Vision? N N  Difficulty concentrating or making decisions? N N  Walking or climbing stairs? N N  Dressing or bathing? N N  Doing errands, shopping? N N  Preparing Food and eating ? N -  Using the Toilet? N -  In the past six months, have you accidently leaked urine? N -  Do you have problems with loss of bowel control? N -  Managing your Medications? N -  Managing your Finances? N -  Housekeeping or managing your Housekeeping? N -  Some recent data might be hidden    Patient Care Team: Ladell Pier, MD as PCP - General (Internal  Medicine)  Indicate any recent Medical Services you may have received from other than [facility] providers in the past year (date may be approximate).     Assessment:   This is a routine wellness examination for [place].  Hearing/Vision screen No exam data present  Dietary issues and exercise activities discussed: Current Exercise Habits: Home exercise routine, Type of exercise: walking, Time (Minutes): 30, Frequency (Times/Week): 3, Weekly Exercise (Minutes/Week): 90, Intensity: Mild  Goals Addressed   None    Depression Screen PHQ 2/9 Scores 09/13/2020 08/23/2020 05/20/2020  PHQ - 2 Score '1 1 2  '$ PHQ- 9 Score - - 9    Fall Risk Fall Risk  09/13/2020 08/23/2020 05/20/2020  Falls in the past year? 0 0 0  Number falls in past yr: 0 0 0  Injury with Fall? 0 0 0  Risk for fall due to : No Fall Risks No Fall Risks -  Follow up Falls evaluation completed;Education provided;Falls prevention discussed - -    FALL RISK PREVENTION PERTAINING TO THE HOME: Any stairs in or around the home? No  If so, are there any without handrails? No  Home free of loose throw rugs in walkways, pet beds, electrical cords, etc? Yes  Adequate lighting in your home to reduce risk of falls? Yes   ASSISTIVE DEVICES UTILIZED TO PREVENT FALLS:  Life alert? No  Use of a cane, walker or w/c? No  Grab bars in the bathroom? No  Shower chair or bench in shower? No  Elevated toilet seat or a handicapped toilet? No   TIMED UP AND GO:  Was the test performed? Yes .  Length of time to ambulate 10 feet: 2 sec.   Gait steady and fast without use of assistive device  Cognitive Function: MMSE - Mini Mental State Exam 09/13/2020  Orientation to time 5  Orientation to Place 5  Registration 3  Attention/ Calculation 5  Recall 3  Language- name 2 objects 2  Language- repeat 1  Language- follow 3 step command 3  Language- read & follow direction 1  Write a sentence 1  Copy design 1  Total score 30  Immunizations Immunization History  Administered Date(s) Administered  . Influenza, Seasonal, Injecte, Preservative Fre 03/18/2015, 02/15/2018  . Influenza-Unspecified 03/18/2015, 01/31/2018, 02/15/2018, 02/09/2019, 02/04/2020  . Moderna Sars-Covid-2 Vaccination 11/15/2019, 04/13/2020  . PPD Test 08/27/2019  . Pneumococcal Polysaccharide-23 02/08/2017    TDAP status: Due, Education has been provided regarding the importance of this vaccine. Advised may receive this vaccine at local pharmacy or Health Dept. Aware to provide a copy of the vaccination record if obtained from local pharmacy or Health Dept. Verbalized acceptance and understanding.  Flu Vaccine status: Up to date  Pneumococcal vaccine status: Due, Education has been provided regarding the importance of this vaccine. Advised may receive this vaccine at local pharmacy or Health Dept. Aware to provide a copy of the vaccination record if obtained from local pharmacy or Health Dept. Verbalized acceptance and understanding.  Covid-19 vaccine status: Completed vaccines  Qualifies for Shingles Vaccine? No   Zostavax completed No   Shingrix Completed?: No.    Education has been provided regarding the importance of this vaccine. Patient has been advised to call insurance company to determine out of pocket expense if they have not yet received this vaccine. Advised may also receive vaccine at local pharmacy or Health Dept. Verbalized acceptance and understanding.  Screening Tests Health Maintenance  Topic Date Due  . Pneumococcal Vaccine 6-13 Years old (1 of 4 - PCV13) Never done  . Hepatitis C Screening  Never done  . TETANUS/TDAP  Never done  . COVID-19 Vaccine (3 - Booster for Moderna series) 09/11/2020  . INFLUENZA VACCINE  11/08/2020  . HEMOGLOBIN A1C  11/29/2020  . FOOT EXAM  05/20/2021  . URINE MICROALBUMIN  05/20/2021  . OPHTHALMOLOGY EXAM  05/26/2021  . Zoster Vaccines- Shingrix (1 of 2) 11/28/2021  . COLONOSCOPY (Pts  45-31yr Insurance coverage will need to be confirmed)  02/10/2030  . PNEUMOCOCCAL POLYSACCHARIDE VACCINE AGE 97-64 HIGH RISK  Completed  . HIV Screening  Completed  . HPV VACCINES  Aged Out    Health Maintenance  Health Maintenance Due  Topic Date Due  . Pneumococcal Vaccine 068639Years old (1 of 4 - PCV13) Never done  . Hepatitis C Screening  Never done  . TETANUS/TDAP  Never done  . COVID-19 Vaccine (3 - Booster for Moderna series) 09/11/2020    Colorectal cancer screening: Type of screening: Colonoscopy. Completed 02/11/2020. Repeat every 10 years  Lung Cancer Screening: (Low Dose CT Chest recommended if Age 49-80years, 30 pack-year currently smoking OR have quit w/in 15years.) does not qualify.   Lung Cancer Screening Referral: None needed   Additional Screening:  Hepatitis C Screening: does qualify; Completed previously per pt. We do not have the results of these in our chart.   Vision Screening: Recommended annual ophthalmology exams for early detection of glaucoma and other disorders of the eye. Is the patient up to date with their annual eye exam?  Yes  Who is the provider or what is the name of the office in which the patient attends annual eye exams? Dr. GKaty FitchIf pt is not established with a provider, would they like to be referred to a provider to establish care? No .   Dental Screening: Recommended annual dental exams for proper oral hygiene  Community Resource Referral / Chronic Care Management: CRR required this visit?  No   CCM required this visit?  No      Plan:     I have personally reviewed and noted the following in the patient's chart:   .  Medical and social history . Use of alcohol, tobacco or illicit drugs  . Current medications and supplements including opioid prescriptions. Patient is not currently taking opioid prescriptions. . Functional ability and status . Nutritional status . Physical activity . Advanced directives . List of other  physicians . Hospitalizations, surgeries, and ER visits in previous 12 months . Vitals . Screenings to include cognitive, depression, and falls . Referrals and appointments  In addition, I have reviewed and discussed with patient certain preventive protocols, quality metrics, and best practice recommendations. A written personalized care plan for preventive services as well as general preventive health recommendations were provided to patient.  [place], RPH-CPP   09/13/2020

## 2020-09-14 ENCOUNTER — Ambulatory Visit: Admission: RE | Admit: 2020-09-14 | Payer: Medicare Other | Source: Home / Self Care | Admitting: Vascular Surgery

## 2020-09-14 ENCOUNTER — Ambulatory Visit: Admit: 2020-09-14 | Payer: Medicare Other | Admitting: Vascular Surgery

## 2020-09-14 ENCOUNTER — Encounter: Admission: RE | Payer: Self-pay | Source: Home / Self Care

## 2020-09-14 DIAGNOSIS — N186 End stage renal disease: Secondary | ICD-10-CM

## 2020-09-14 SURGERY — A/V FISTULAGRAM
Anesthesia: Moderate Sedation | Laterality: Left

## 2020-09-15 ENCOUNTER — Ambulatory Visit (HOSPITAL_COMMUNITY): Payer: Medicare Other

## 2020-09-15 DIAGNOSIS — N186 End stage renal disease: Secondary | ICD-10-CM | POA: Diagnosis not present

## 2020-09-15 DIAGNOSIS — Z992 Dependence on renal dialysis: Secondary | ICD-10-CM | POA: Diagnosis not present

## 2020-09-16 DIAGNOSIS — N186 End stage renal disease: Secondary | ICD-10-CM | POA: Diagnosis not present

## 2020-09-16 DIAGNOSIS — Z992 Dependence on renal dialysis: Secondary | ICD-10-CM | POA: Diagnosis not present

## 2020-09-17 ENCOUNTER — Ambulatory Visit (HOSPITAL_COMMUNITY): Payer: Medicare Other

## 2020-09-17 DIAGNOSIS — N186 End stage renal disease: Secondary | ICD-10-CM | POA: Diagnosis not present

## 2020-09-17 DIAGNOSIS — Z992 Dependence on renal dialysis: Secondary | ICD-10-CM | POA: Diagnosis not present

## 2020-09-17 DIAGNOSIS — D509 Iron deficiency anemia, unspecified: Secondary | ICD-10-CM | POA: Diagnosis not present

## 2020-09-18 DIAGNOSIS — N186 End stage renal disease: Secondary | ICD-10-CM | POA: Diagnosis not present

## 2020-09-18 DIAGNOSIS — Z992 Dependence on renal dialysis: Secondary | ICD-10-CM | POA: Diagnosis not present

## 2020-09-19 DIAGNOSIS — N186 End stage renal disease: Secondary | ICD-10-CM | POA: Diagnosis not present

## 2020-09-19 DIAGNOSIS — Z992 Dependence on renal dialysis: Secondary | ICD-10-CM | POA: Diagnosis not present

## 2020-09-20 ENCOUNTER — Ambulatory Visit (HOSPITAL_COMMUNITY): Payer: Medicare Other

## 2020-09-20 DIAGNOSIS — Z992 Dependence on renal dialysis: Secondary | ICD-10-CM | POA: Diagnosis not present

## 2020-09-20 DIAGNOSIS — N186 End stage renal disease: Secondary | ICD-10-CM | POA: Diagnosis not present

## 2020-09-21 DIAGNOSIS — N186 End stage renal disease: Secondary | ICD-10-CM | POA: Diagnosis not present

## 2020-09-21 DIAGNOSIS — Z992 Dependence on renal dialysis: Secondary | ICD-10-CM | POA: Diagnosis not present

## 2020-09-22 ENCOUNTER — Ambulatory Visit (HOSPITAL_COMMUNITY): Payer: Medicare Other

## 2020-09-22 DIAGNOSIS — Z992 Dependence on renal dialysis: Secondary | ICD-10-CM | POA: Diagnosis not present

## 2020-09-22 DIAGNOSIS — N186 End stage renal disease: Secondary | ICD-10-CM | POA: Diagnosis not present

## 2020-09-23 DIAGNOSIS — Z992 Dependence on renal dialysis: Secondary | ICD-10-CM | POA: Diagnosis not present

## 2020-09-23 DIAGNOSIS — N186 End stage renal disease: Secondary | ICD-10-CM | POA: Diagnosis not present

## 2020-09-24 ENCOUNTER — Ambulatory Visit (HOSPITAL_COMMUNITY): Payer: Medicare Other

## 2020-09-24 DIAGNOSIS — N186 End stage renal disease: Secondary | ICD-10-CM | POA: Diagnosis not present

## 2020-09-24 DIAGNOSIS — Z992 Dependence on renal dialysis: Secondary | ICD-10-CM | POA: Diagnosis not present

## 2020-09-25 DIAGNOSIS — Z992 Dependence on renal dialysis: Secondary | ICD-10-CM | POA: Diagnosis not present

## 2020-09-25 DIAGNOSIS — N186 End stage renal disease: Secondary | ICD-10-CM | POA: Diagnosis not present

## 2020-09-26 DIAGNOSIS — N186 End stage renal disease: Secondary | ICD-10-CM | POA: Diagnosis not present

## 2020-09-26 DIAGNOSIS — Z992 Dependence on renal dialysis: Secondary | ICD-10-CM | POA: Diagnosis not present

## 2020-09-27 ENCOUNTER — Encounter: Payer: Self-pay | Admitting: *Deleted

## 2020-09-27 ENCOUNTER — Ambulatory Visit (HOSPITAL_COMMUNITY): Payer: Medicare Other

## 2020-09-27 DIAGNOSIS — Z992 Dependence on renal dialysis: Secondary | ICD-10-CM | POA: Diagnosis not present

## 2020-09-27 DIAGNOSIS — N186 End stage renal disease: Secondary | ICD-10-CM | POA: Diagnosis not present

## 2020-09-28 DIAGNOSIS — N186 End stage renal disease: Secondary | ICD-10-CM | POA: Diagnosis not present

## 2020-09-28 DIAGNOSIS — Z992 Dependence on renal dialysis: Secondary | ICD-10-CM | POA: Diagnosis not present

## 2020-09-29 ENCOUNTER — Ambulatory Visit (HOSPITAL_COMMUNITY): Payer: Medicare Other

## 2020-09-29 DIAGNOSIS — Z992 Dependence on renal dialysis: Secondary | ICD-10-CM | POA: Diagnosis not present

## 2020-09-29 DIAGNOSIS — N186 End stage renal disease: Secondary | ICD-10-CM | POA: Diagnosis not present

## 2020-09-30 DIAGNOSIS — N186 End stage renal disease: Secondary | ICD-10-CM | POA: Diagnosis not present

## 2020-09-30 DIAGNOSIS — Z992 Dependence on renal dialysis: Secondary | ICD-10-CM | POA: Diagnosis not present

## 2020-10-01 ENCOUNTER — Ambulatory Visit (HOSPITAL_COMMUNITY): Payer: Medicare Other

## 2020-10-01 DIAGNOSIS — N186 End stage renal disease: Secondary | ICD-10-CM | POA: Diagnosis not present

## 2020-10-01 DIAGNOSIS — Z992 Dependence on renal dialysis: Secondary | ICD-10-CM | POA: Diagnosis not present

## 2020-10-02 DIAGNOSIS — Z992 Dependence on renal dialysis: Secondary | ICD-10-CM | POA: Diagnosis not present

## 2020-10-02 DIAGNOSIS — N186 End stage renal disease: Secondary | ICD-10-CM | POA: Diagnosis not present

## 2020-10-03 DIAGNOSIS — N186 End stage renal disease: Secondary | ICD-10-CM | POA: Diagnosis not present

## 2020-10-03 DIAGNOSIS — Z992 Dependence on renal dialysis: Secondary | ICD-10-CM | POA: Diagnosis not present

## 2020-10-04 ENCOUNTER — Ambulatory Visit (HOSPITAL_COMMUNITY): Payer: Medicare Other

## 2020-10-04 DIAGNOSIS — Z992 Dependence on renal dialysis: Secondary | ICD-10-CM | POA: Diagnosis not present

## 2020-10-04 DIAGNOSIS — N186 End stage renal disease: Secondary | ICD-10-CM | POA: Diagnosis not present

## 2020-10-05 DIAGNOSIS — Z992 Dependence on renal dialysis: Secondary | ICD-10-CM | POA: Diagnosis not present

## 2020-10-05 DIAGNOSIS — N186 End stage renal disease: Secondary | ICD-10-CM | POA: Diagnosis not present

## 2020-10-06 ENCOUNTER — Ambulatory Visit (HOSPITAL_COMMUNITY): Payer: Medicare Other

## 2020-10-06 DIAGNOSIS — Z992 Dependence on renal dialysis: Secondary | ICD-10-CM | POA: Diagnosis not present

## 2020-10-06 DIAGNOSIS — N186 End stage renal disease: Secondary | ICD-10-CM | POA: Diagnosis not present

## 2020-10-07 DIAGNOSIS — N186 End stage renal disease: Secondary | ICD-10-CM | POA: Diagnosis not present

## 2020-10-07 DIAGNOSIS — Z992 Dependence on renal dialysis: Secondary | ICD-10-CM | POA: Diagnosis not present

## 2020-10-08 ENCOUNTER — Ambulatory Visit (HOSPITAL_COMMUNITY): Payer: Medicare Other

## 2020-10-08 DIAGNOSIS — Z992 Dependence on renal dialysis: Secondary | ICD-10-CM | POA: Diagnosis not present

## 2020-10-08 DIAGNOSIS — N186 End stage renal disease: Secondary | ICD-10-CM | POA: Diagnosis not present

## 2020-10-09 DIAGNOSIS — N186 End stage renal disease: Secondary | ICD-10-CM | POA: Diagnosis not present

## 2020-10-09 DIAGNOSIS — Z992 Dependence on renal dialysis: Secondary | ICD-10-CM | POA: Diagnosis not present

## 2020-10-10 DIAGNOSIS — N186 End stage renal disease: Secondary | ICD-10-CM | POA: Diagnosis not present

## 2020-10-10 DIAGNOSIS — Z992 Dependence on renal dialysis: Secondary | ICD-10-CM | POA: Diagnosis not present

## 2020-10-11 DIAGNOSIS — N186 End stage renal disease: Secondary | ICD-10-CM | POA: Diagnosis not present

## 2020-10-11 DIAGNOSIS — Z992 Dependence on renal dialysis: Secondary | ICD-10-CM | POA: Diagnosis not present

## 2020-10-12 DIAGNOSIS — Z992 Dependence on renal dialysis: Secondary | ICD-10-CM | POA: Diagnosis not present

## 2020-10-12 DIAGNOSIS — N186 End stage renal disease: Secondary | ICD-10-CM | POA: Diagnosis not present

## 2020-10-13 ENCOUNTER — Ambulatory Visit (HOSPITAL_COMMUNITY): Payer: Medicare Other

## 2020-10-13 DIAGNOSIS — Z992 Dependence on renal dialysis: Secondary | ICD-10-CM | POA: Diagnosis not present

## 2020-10-13 DIAGNOSIS — N186 End stage renal disease: Secondary | ICD-10-CM | POA: Diagnosis not present

## 2020-10-14 DIAGNOSIS — Z992 Dependence on renal dialysis: Secondary | ICD-10-CM | POA: Diagnosis not present

## 2020-10-14 DIAGNOSIS — N186 End stage renal disease: Secondary | ICD-10-CM | POA: Diagnosis not present

## 2020-10-15 ENCOUNTER — Ambulatory Visit (HOSPITAL_COMMUNITY): Payer: Medicare Other

## 2020-10-15 DIAGNOSIS — Z992 Dependence on renal dialysis: Secondary | ICD-10-CM | POA: Diagnosis not present

## 2020-10-15 DIAGNOSIS — N186 End stage renal disease: Secondary | ICD-10-CM | POA: Diagnosis not present

## 2020-10-16 DIAGNOSIS — N186 End stage renal disease: Secondary | ICD-10-CM | POA: Diagnosis not present

## 2020-10-16 DIAGNOSIS — Z992 Dependence on renal dialysis: Secondary | ICD-10-CM | POA: Diagnosis not present

## 2020-10-17 DIAGNOSIS — N186 End stage renal disease: Secondary | ICD-10-CM | POA: Diagnosis not present

## 2020-10-17 DIAGNOSIS — Z992 Dependence on renal dialysis: Secondary | ICD-10-CM | POA: Diagnosis not present

## 2020-10-18 DIAGNOSIS — Z992 Dependence on renal dialysis: Secondary | ICD-10-CM | POA: Diagnosis not present

## 2020-10-18 DIAGNOSIS — R0600 Dyspnea, unspecified: Secondary | ICD-10-CM | POA: Diagnosis not present

## 2020-10-18 DIAGNOSIS — I21A1 Myocardial infarction type 2: Secondary | ICD-10-CM | POA: Diagnosis not present

## 2020-10-18 DIAGNOSIS — I5032 Chronic diastolic (congestive) heart failure: Secondary | ICD-10-CM | POA: Diagnosis not present

## 2020-10-18 DIAGNOSIS — N186 End stage renal disease: Secondary | ICD-10-CM | POA: Diagnosis not present

## 2020-10-18 DIAGNOSIS — Z7682 Awaiting organ transplant status: Secondary | ICD-10-CM | POA: Diagnosis not present

## 2020-10-18 DIAGNOSIS — I219 Acute myocardial infarction, unspecified: Secondary | ICD-10-CM | POA: Diagnosis not present

## 2020-10-18 DIAGNOSIS — I252 Old myocardial infarction: Secondary | ICD-10-CM | POA: Diagnosis not present

## 2020-10-18 DIAGNOSIS — Z01810 Encounter for preprocedural cardiovascular examination: Secondary | ICD-10-CM | POA: Diagnosis not present

## 2020-10-18 DIAGNOSIS — Z951 Presence of aortocoronary bypass graft: Secondary | ICD-10-CM | POA: Diagnosis not present

## 2020-10-18 DIAGNOSIS — I2510 Atherosclerotic heart disease of native coronary artery without angina pectoris: Secondary | ICD-10-CM | POA: Diagnosis not present

## 2020-10-18 DIAGNOSIS — R079 Chest pain, unspecified: Secondary | ICD-10-CM | POA: Diagnosis not present

## 2020-10-18 DIAGNOSIS — Z79899 Other long term (current) drug therapy: Secondary | ICD-10-CM | POA: Diagnosis not present

## 2020-10-18 DIAGNOSIS — I10 Essential (primary) hypertension: Secondary | ICD-10-CM | POA: Diagnosis not present

## 2020-10-18 DIAGNOSIS — I088 Other rheumatic multiple valve diseases: Secondary | ICD-10-CM | POA: Diagnosis not present

## 2020-10-19 DIAGNOSIS — Z992 Dependence on renal dialysis: Secondary | ICD-10-CM | POA: Diagnosis not present

## 2020-10-19 DIAGNOSIS — E119 Type 2 diabetes mellitus without complications: Secondary | ICD-10-CM | POA: Diagnosis not present

## 2020-10-19 DIAGNOSIS — E785 Hyperlipidemia, unspecified: Secondary | ICD-10-CM | POA: Diagnosis not present

## 2020-10-19 DIAGNOSIS — N186 End stage renal disease: Secondary | ICD-10-CM | POA: Diagnosis not present

## 2020-10-19 DIAGNOSIS — D509 Iron deficiency anemia, unspecified: Secondary | ICD-10-CM | POA: Diagnosis not present

## 2020-10-20 DIAGNOSIS — N186 End stage renal disease: Secondary | ICD-10-CM | POA: Diagnosis not present

## 2020-10-20 DIAGNOSIS — Z992 Dependence on renal dialysis: Secondary | ICD-10-CM | POA: Diagnosis not present

## 2020-10-21 DIAGNOSIS — N186 End stage renal disease: Secondary | ICD-10-CM | POA: Diagnosis not present

## 2020-10-21 DIAGNOSIS — Z992 Dependence on renal dialysis: Secondary | ICD-10-CM | POA: Diagnosis not present

## 2020-10-22 DIAGNOSIS — N186 End stage renal disease: Secondary | ICD-10-CM | POA: Diagnosis not present

## 2020-10-22 DIAGNOSIS — Z992 Dependence on renal dialysis: Secondary | ICD-10-CM | POA: Diagnosis not present

## 2020-10-23 DIAGNOSIS — N186 End stage renal disease: Secondary | ICD-10-CM | POA: Diagnosis not present

## 2020-10-23 DIAGNOSIS — Z992 Dependence on renal dialysis: Secondary | ICD-10-CM | POA: Diagnosis not present

## 2020-10-24 DIAGNOSIS — N186 End stage renal disease: Secondary | ICD-10-CM | POA: Diagnosis not present

## 2020-10-24 DIAGNOSIS — Z992 Dependence on renal dialysis: Secondary | ICD-10-CM | POA: Diagnosis not present

## 2020-10-25 DIAGNOSIS — N186 End stage renal disease: Secondary | ICD-10-CM | POA: Diagnosis not present

## 2020-10-25 DIAGNOSIS — Z992 Dependence on renal dialysis: Secondary | ICD-10-CM | POA: Diagnosis not present

## 2020-10-26 DIAGNOSIS — N186 End stage renal disease: Secondary | ICD-10-CM | POA: Diagnosis not present

## 2020-10-26 DIAGNOSIS — Z992 Dependence on renal dialysis: Secondary | ICD-10-CM | POA: Diagnosis not present

## 2020-10-27 DIAGNOSIS — N186 End stage renal disease: Secondary | ICD-10-CM | POA: Diagnosis not present

## 2020-10-27 DIAGNOSIS — Z992 Dependence on renal dialysis: Secondary | ICD-10-CM | POA: Diagnosis not present

## 2020-10-28 DIAGNOSIS — Z992 Dependence on renal dialysis: Secondary | ICD-10-CM | POA: Diagnosis not present

## 2020-10-28 DIAGNOSIS — N186 End stage renal disease: Secondary | ICD-10-CM | POA: Diagnosis not present

## 2020-10-29 DIAGNOSIS — Z992 Dependence on renal dialysis: Secondary | ICD-10-CM | POA: Diagnosis not present

## 2020-10-29 DIAGNOSIS — N186 End stage renal disease: Secondary | ICD-10-CM | POA: Diagnosis not present

## 2020-10-30 DIAGNOSIS — Z992 Dependence on renal dialysis: Secondary | ICD-10-CM | POA: Diagnosis not present

## 2020-10-30 DIAGNOSIS — N186 End stage renal disease: Secondary | ICD-10-CM | POA: Diagnosis not present

## 2020-10-31 DIAGNOSIS — N186 End stage renal disease: Secondary | ICD-10-CM | POA: Diagnosis not present

## 2020-10-31 DIAGNOSIS — Z992 Dependence on renal dialysis: Secondary | ICD-10-CM | POA: Diagnosis not present

## 2020-11-01 DIAGNOSIS — Z992 Dependence on renal dialysis: Secondary | ICD-10-CM | POA: Diagnosis not present

## 2020-11-01 DIAGNOSIS — N186 End stage renal disease: Secondary | ICD-10-CM | POA: Diagnosis not present

## 2020-11-02 DIAGNOSIS — N186 End stage renal disease: Secondary | ICD-10-CM | POA: Diagnosis not present

## 2020-11-02 DIAGNOSIS — Z992 Dependence on renal dialysis: Secondary | ICD-10-CM | POA: Diagnosis not present

## 2020-11-03 ENCOUNTER — Telehealth (HOSPITAL_COMMUNITY): Payer: Self-pay

## 2020-11-03 DIAGNOSIS — Z992 Dependence on renal dialysis: Secondary | ICD-10-CM | POA: Diagnosis not present

## 2020-11-03 DIAGNOSIS — N186 End stage renal disease: Secondary | ICD-10-CM | POA: Diagnosis not present

## 2020-11-03 NOTE — Telephone Encounter (Signed)
Pt insurance is active and benefits verified through [facility]. Co-pay $0.00, DED $0.00/$0.00 met, out of pocket $4,500.00/$4,500.00 met, co-insurance 0%. No pre-authorization required. Passport, 11/03/2020 @ 10:57PM, OTL#[id number]

## 2020-11-03 NOTE — Telephone Encounter (Signed)
Pt called and stated he is now interested in CR. Adv pt of our backlog of 1-3 months.

## 2020-11-04 DIAGNOSIS — N186 End stage renal disease: Secondary | ICD-10-CM | POA: Diagnosis not present

## 2020-11-04 DIAGNOSIS — Z992 Dependence on renal dialysis: Secondary | ICD-10-CM | POA: Diagnosis not present

## 2020-11-05 DIAGNOSIS — Z992 Dependence on renal dialysis: Secondary | ICD-10-CM | POA: Diagnosis not present

## 2020-11-05 DIAGNOSIS — N186 End stage renal disease: Secondary | ICD-10-CM | POA: Diagnosis not present

## 2020-11-06 DIAGNOSIS — N186 End stage renal disease: Secondary | ICD-10-CM | POA: Diagnosis not present

## 2020-11-06 DIAGNOSIS — Z992 Dependence on renal dialysis: Secondary | ICD-10-CM | POA: Diagnosis not present

## 2020-11-07 DIAGNOSIS — Z992 Dependence on renal dialysis: Secondary | ICD-10-CM | POA: Diagnosis not present

## 2020-11-07 DIAGNOSIS — N186 End stage renal disease: Secondary | ICD-10-CM | POA: Diagnosis not present

## 2020-11-08 DIAGNOSIS — N186 End stage renal disease: Secondary | ICD-10-CM | POA: Diagnosis not present

## 2020-11-08 DIAGNOSIS — Z992 Dependence on renal dialysis: Secondary | ICD-10-CM | POA: Diagnosis not present

## 2020-11-09 DIAGNOSIS — N186 End stage renal disease: Secondary | ICD-10-CM | POA: Diagnosis not present

## 2020-11-09 DIAGNOSIS — Z992 Dependence on renal dialysis: Secondary | ICD-10-CM | POA: Diagnosis not present

## 2020-11-10 DIAGNOSIS — Z992 Dependence on renal dialysis: Secondary | ICD-10-CM | POA: Diagnosis not present

## 2020-11-10 DIAGNOSIS — N186 End stage renal disease: Secondary | ICD-10-CM | POA: Diagnosis not present

## 2020-11-11 DIAGNOSIS — N186 End stage renal disease: Secondary | ICD-10-CM | POA: Diagnosis not present

## 2020-11-11 DIAGNOSIS — D509 Iron deficiency anemia, unspecified: Secondary | ICD-10-CM | POA: Diagnosis not present

## 2020-11-11 DIAGNOSIS — Z992 Dependence on renal dialysis: Secondary | ICD-10-CM | POA: Diagnosis not present

## 2020-11-12 DIAGNOSIS — N186 End stage renal disease: Secondary | ICD-10-CM | POA: Diagnosis not present

## 2020-11-12 DIAGNOSIS — Z992 Dependence on renal dialysis: Secondary | ICD-10-CM | POA: Diagnosis not present

## 2020-11-13 DIAGNOSIS — Z992 Dependence on renal dialysis: Secondary | ICD-10-CM | POA: Diagnosis not present

## 2020-11-13 DIAGNOSIS — N186 End stage renal disease: Secondary | ICD-10-CM | POA: Diagnosis not present

## 2020-11-14 DIAGNOSIS — N186 End stage renal disease: Secondary | ICD-10-CM | POA: Diagnosis not present

## 2020-11-14 DIAGNOSIS — Z992 Dependence on renal dialysis: Secondary | ICD-10-CM | POA: Diagnosis not present

## 2020-11-15 DIAGNOSIS — N186 End stage renal disease: Secondary | ICD-10-CM | POA: Diagnosis not present

## 2020-11-15 DIAGNOSIS — Z992 Dependence on renal dialysis: Secondary | ICD-10-CM | POA: Diagnosis not present

## 2020-11-16 DIAGNOSIS — Z992 Dependence on renal dialysis: Secondary | ICD-10-CM | POA: Diagnosis not present

## 2020-11-16 DIAGNOSIS — N186 End stage renal disease: Secondary | ICD-10-CM | POA: Diagnosis not present

## 2020-11-17 DIAGNOSIS — N186 End stage renal disease: Secondary | ICD-10-CM | POA: Diagnosis not present

## 2020-11-17 DIAGNOSIS — Z992 Dependence on renal dialysis: Secondary | ICD-10-CM | POA: Diagnosis not present

## 2020-11-18 DIAGNOSIS — N186 End stage renal disease: Secondary | ICD-10-CM | POA: Diagnosis not present

## 2020-11-18 DIAGNOSIS — Z992 Dependence on renal dialysis: Secondary | ICD-10-CM | POA: Diagnosis not present

## 2020-11-19 DIAGNOSIS — Z992 Dependence on renal dialysis: Secondary | ICD-10-CM | POA: Diagnosis not present

## 2020-11-19 DIAGNOSIS — N186 End stage renal disease: Secondary | ICD-10-CM | POA: Diagnosis not present

## 2020-11-20 DIAGNOSIS — Z992 Dependence on renal dialysis: Secondary | ICD-10-CM | POA: Diagnosis not present

## 2020-11-20 DIAGNOSIS — N186 End stage renal disease: Secondary | ICD-10-CM | POA: Diagnosis not present

## 2020-11-21 DIAGNOSIS — Z992 Dependence on renal dialysis: Secondary | ICD-10-CM | POA: Diagnosis not present

## 2020-11-21 DIAGNOSIS — N186 End stage renal disease: Secondary | ICD-10-CM | POA: Diagnosis not present

## 2020-11-22 DIAGNOSIS — N186 End stage renal disease: Secondary | ICD-10-CM | POA: Diagnosis not present

## 2020-11-22 DIAGNOSIS — Z992 Dependence on renal dialysis: Secondary | ICD-10-CM | POA: Diagnosis not present

## 2020-11-23 DIAGNOSIS — Z992 Dependence on renal dialysis: Secondary | ICD-10-CM | POA: Diagnosis not present

## 2020-11-23 DIAGNOSIS — N186 End stage renal disease: Secondary | ICD-10-CM | POA: Diagnosis not present

## 2020-11-24 ENCOUNTER — Ambulatory Visit (INDEPENDENT_AMBULATORY_CARE_PROVIDER_SITE_OTHER): Payer: Medicare Other | Admitting: Ophthalmology

## 2020-11-24 ENCOUNTER — Encounter (INDEPENDENT_AMBULATORY_CARE_PROVIDER_SITE_OTHER): Payer: Self-pay | Admitting: Ophthalmology

## 2020-11-24 ENCOUNTER — Other Ambulatory Visit: Payer: Self-pay

## 2020-11-24 DIAGNOSIS — N186 End stage renal disease: Secondary | ICD-10-CM | POA: Diagnosis not present

## 2020-11-24 DIAGNOSIS — Z992 Dependence on renal dialysis: Secondary | ICD-10-CM | POA: Diagnosis not present

## 2020-11-24 DIAGNOSIS — H35371 Puckering of macula, right eye: Secondary | ICD-10-CM

## 2020-11-24 DIAGNOSIS — H2511 Age-related nuclear cataract, right eye: Secondary | ICD-10-CM

## 2020-11-24 DIAGNOSIS — Z794 Long term (current) use of insulin: Secondary | ICD-10-CM

## 2020-11-24 DIAGNOSIS — E113553 Type 2 diabetes mellitus with stable proliferative diabetic retinopathy, bilateral: Secondary | ICD-10-CM

## 2020-11-24 NOTE — Assessment & Plan Note (Addendum)
The nature of regressed proliferative diabetic retinopathy was discussed with the patient. The patient was advised to maintain good glucose, blood pressure, monitor kidney function and serum lipid control as advised by personal physician. Rare risk for reactivation of progression exist with untreated severe anemia, untreated renal failure, untreated heart failure, and smoking. Complete avoidance of smoking was recommended. The chance of recurrent proliferative diabetic retinopathy was discussed as well as the chance of vitreous hemorrhage for which further treatments may be necessary.   Explained to the patient that the quiescent  proliferative diabetic retinopathy disease is unlikely to ever worsen.  Worsening factors would include however severe anemia, hypertension out-of-control or impending renal failure.  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  Stable overall  OD macula attached, good acuity, peripheral fibrous tissue which is in active and avascular 360 along the temporal arcades elevated with no traction on the fovea thus and active will observe good PRP

## 2020-11-24 NOTE — Assessment & Plan Note (Signed)
Slight progression of nuclear sclerotic cataract follow-up with Dr. Nathanial Rancher as scheduled

## 2020-11-24 NOTE — Assessment & Plan Note (Signed)
Minor epiretinal membrane by OCT OD And no active maculopathy will observe

## 2020-11-24 NOTE — Progress Notes (Signed)
11/24/2020     CHIEF COMPLAINT Patient presents for Retina Follow Up   HISTORY OF PRESENT ILLNESS: Donald [name] is a 50 y.o. male who presents to the clinic today for:   HPI     Retina Follow Up           Diagnosis: Other   Laterality: both eyes   Onset: 6 months ago   Severity: mild   Duration: 6 months   Course: stable         Comments   6 month fu OU and OCT/FP Pt states, "I have glasses now so I feel like I can see a little better. I am not having any distortion that I can tell." A1C: 6.5 LBS: 145       Last edited by Kendra Opitz, COA on 11/24/2020  1:14 PM.      Referring physician: Clent Jacks, MD [address] [place],  [place] [address]  HISTORICAL INFORMATION:   Selected notes from the MEDICAL RECORD NUMBER    Lab Results  Component Value Date   HGBA1C 6.4 (H) 06/01/2020     CURRENT MEDICATIONS: No current outpatient medications on file. (Ophthalmic Drugs)   No current facility-administered medications for this visit. (Ophthalmic Drugs)   Current Outpatient Medications (Other)  Medication Sig   aspirin EC 81 MG EC tablet Take 1 tablet (81 mg total) by mouth daily.   calcium acetate (PHOSLO) 667 MG capsule Take 667-2,001 mg by mouth See admin instructions. Take 3 capsules by mouth with each meal & take 1 capsule with snacks   carvedilol (COREG) 12.5 MG tablet Take 1 tablet (12.5 mg total) by mouth 2 (two) times daily with a meal.   cinacalcet (SENSIPAR) 30 MG tablet Take by mouth daily.   clopidogrel (PLAVIX) 75 MG tablet Take 1 tablet (75 mg total) by mouth daily.   Continuous Blood Gluc Sensor (FREESTYLE LIBRE SENSOR SYSTEM) MISC Use to check blood sugar at least 4 times daily. Change sensor Q 2 wks   gentamicin cream (GARAMYCIN) 0.1 % Apply 1 application topically every morning. Apply to exit site every day   insulin aspart (NOVOLOG) 100 UNIT/ML FlexPen Inject 6 Units into the skin 3 (three) times daily with meals.   insulin  glargine (LANTUS SOLOSTAR) 100 UNIT/ML Solostar Pen Inject 20 Units into the skin daily. (Patient taking differently: Inject 20 Units into the skin every evening.)   Insulin Pen Needle (TRUEPLUS PEN NEEDLES) 31G X 6 MM MISC Use to inject Novolog TID and Lantus once daily. Total injections in 1 day = 4.   oxyCODONE (ROXICODONE) 5 MG immediate release tablet Take 1 tablet (5 mg total) by mouth every 4 (four) hours as needed for severe pain.   Peritoneal Dialysis Solutions (DIALYSIS SOLUTION 2.5% LOW-MG/LOW-CA) 394 MOSM/L SOLN dianeal solution As per nephro   rosuvastatin (CRESTOR) 40 MG tablet Take 40 mg by mouth in the morning.   senna (SENOKOT) 8.6 MG tablet Take 2 tablets (17.2 mg total) by mouth daily. (Patient taking differently: Take 2 tablets by mouth daily as needed for constipation.)   No current facility-administered medications for this visit. (Other)      REVIEW OF SYSTEMS:    ALLERGIES Allergies  Allergen Reactions   Other Swelling    Other reaction(s): EGGPLANT Facial swelling   Lisinopril Hives    PAST MEDICAL HISTORY Past Medical History:  Diagnosis Date   Diabetes mellitus without complication ([place])    History of  vitrectomy 2014   anterior, left   Hx of heart bypass surgery    Hypertension    Renal disorder    dialysis    Past Surgical History:  Procedure Laterality Date   AV FISTULA PLACEMENT     CARDIAC SURGERY     COLONOSCOPY WITH PROPOFOL N/A 02/11/2020   Procedure: COLONOSCOPY WITH PROPOFOL;  Surgeon: Toledo, Benay Pike, MD;  Location: [facility];  Service: Gastroenterology;  Laterality: N/A;   CORONARY ARTERY BYPASS GRAFT  2016   ESOPHAGOGASTRODUODENOSCOPY (EGD) WITH PROPOFOL N/A 01/02/2020   Procedure: ESOPHAGOGASTRODUODENOSCOPY (EGD) WITH PROPOFOL;  Surgeon: Lesly Rubenstein, MD;  Location: [facility] ENDOSCOPY;  Service: Endoscopy;  Laterality: N/A;   INSERTION OF DIALYSIS CATHETER     left great toe amputation     LEFT HEART CATH AND CORS/GRAFTS  ANGIOGRAPHY N/A 06/03/2020   Procedure: LEFT HEART CATH AND CORS/GRAFTS ANGIOGRAPHY;  Surgeon: Sherren Mocha, MD;  Location: [facility];  Service: Cardiovascular;  Laterality: N/A;   VITRECTOMY Left 2014    FAMILY HISTORY Family History  Problem Relation Age of Onset   Diabetes Mellitus II Mother    Heart disease Mother    Diabetes Mother    Hypertension Mother    Kidney failure Mother    Diabetes Maternal Grandmother    Hyperlipidemia Maternal Grandmother    Dementia Maternal Grandmother     SOCIAL HISTORY Social History   Tobacco Use   Smoking status: Never   Smokeless tobacco: Never  Vaping Use   Vaping Use: Never used  Substance Use Topics   Alcohol use: Yes    Comment: occasionally   Drug use: Yes    Types: Marijuana    Comment: occasionally         OPHTHALMIC EXAM:  Base Eye Exam     Visual Acuity (ETDRS)       Right Left   Dist cc 20/30 20/200   Dist ph cc 20/25 -2 NI         Tonometry (Tonopen, 1:17 PM)       Right Left   Pressure 19 17         Pupils       Pupils Dark Light Shape React APD   Right PERRL 3 3 Round Minimal None   Left PERRL 3 3 Round Minimal None         Visual Fields       Left Right   Restrictions Partial outer inferior nasal deficiency Partial outer inferior nasal deficiency         Extraocular Movement       Right Left    Full Full         Neuro/Psych     Oriented x3: Yes         Dilation     Both eyes: 1.0% Mydriacyl, 2.5% Phenylephrine @ 1:17 PM           Slit Lamp and Fundus Exam     External Exam       Right Left   External Normal Normal         Slit Lamp Exam       Right Left   Lids/Lashes Normal Normal   Conjunctiva/Sclera White and quiet White and quiet   Cornea Clear Clear   Anterior Chamber Deep and quiet Deep and quiet   Iris Round and reactive Round and reactive   Lens 2+ Nuclear sclerosis Centered posterior chamber intraocular lens, Open posterior  capsule  Anterior Vitreous Normal Normal         Fundus Exam       Right Left   Posterior Vitreous Partial, low-lying Clear, Avitric   Disc Vit pap traction, old FPD Pallor 1+   C/D Ratio 0.3 0.65   Macula Epiretinal membrane mild, Microaneurysms, Exudates Epiretinal membrane moderate, Focal laser scars   Vessels PDR-quiet PDR-quiet   Periphery Good PRP, traction ST, traction IT with a large sheet of fibrotic posterior hyaloid partially elevated with focal tractional attachments, not a retinal detachment, not atrophic retina, no active NVE Good PRP 360            IMAGING AND PROCEDURES  Imaging and Procedures for 11/24/20  OCT, Retina - OU - Both Eyes       Right Eye Quality was good. Scan locations included subfoveal. Central Foveal Thickness: 260. Progression has no prior data. Findings include epiretinal membrane.   Left Eye Quality was good. Scan locations included subfoveal. Central Foveal Thickness: 155. Progression has no prior data. Findings include epiretinal membrane, inner retinal atrophy, outer retinal atrophy.   Notes OS with diffuse foveal atrophy, no active maculopathy  Epiretinal membrane right eye mild, no active intraretinal fluid nor cystoid macular edema nor CSME     Color Fundus Photography Optos - OU - Both Eyes       Right Eye Progression has no prior data. Disc findings include neovascularization. Macula : epiretinal membrane.   Left Eye Progression has no prior data. Disc findings include pallor.   Notes OD with epiretinal membrane mild to moderate topographic distortion or changes, old inactive neovascularization from the optic nerve now fibrotic proliferation of the disc stable with traction on the residual low-lying posterior partial vitreous detachment  OS, foveal atrophy accounts for acuity most likely as a consequence of prior advanced diabetic retinopathy with loss of the outer ellipsoid zone Layers.  This accounts for acuity.   Mild epiretinal membrane temporally, not visually pathologic.  Bilateral proliferative diabetic retinopathy, quiescent               ASSESSMENT/PLAN:  Controlled type 2 diabetes mellitus with stable proliferative retinopathy of both eyes, with long-term current use of insulin (HCC) The nature of regressed proliferative diabetic retinopathy was discussed with the patient. The patient was advised to maintain good glucose, blood pressure, monitor kidney function and serum lipid control as advised by personal physician. Rare risk for reactivation of progression exist with untreated severe anemia, untreated renal failure, untreated heart failure, and smoking. Complete avoidance of smoking was recommended. The chance of recurrent proliferative diabetic retinopathy was discussed as well as the chance of vitreous hemorrhage for which further treatments may be necessary.   Explained to the patient that the quiescent  proliferative diabetic retinopathy disease is unlikely to ever worsen.  Worsening factors would include however severe anemia, hypertension out-of-control or impending renal failure.  OS entirely quiescent post vitrectomy for tractional detachment related to diabetes performed at [facility] in years past.  Stable overall  OD macula attached, good acuity, peripheral fibrous tissue which is in active and avascular 360 along the temporal arcades elevated with no traction on the fovea thus and active will observe good PRP  Age-related nuclear cataract of right eye Slight progression of nuclear sclerotic cataract follow-up with Dr. Nathanial Rancher as scheduled  Right epiretinal membrane Minor epiretinal membrane by OCT OD And no active maculopathy will observe     ICD-10-CM   1. Controlled type 2  diabetes mellitus with stable proliferative retinopathy of both eyes, with long-term current use of insulin (HCC)  E11.3553 OCT, Retina - OU - Both Eyes   Z79.4 Color  Fundus Photography Optos - OU - Both Eyes    2. Right epiretinal membrane  H35.371     3. Age-related nuclear cataract of right eye  H25.11       1.  OD with excellent vision despite minor nuclear sclerotic cataract.  Moreover fibrous tissue is not actively growing OD with no tractional change on the macula no surgical intervention required for this appearance of the mid vitreous and fibrous tissue which is obviously not growing and not tractional in nature in an eye with excellent PRP peripherally  2.  OS, vitrectomized in years past, performed elsewhere, with macular nonperfusion, macular atrophy and mild optic nerve pallor all accounting for acuity and nonetheless stable condition  3.  Follow-up with Dr. Nathanial Rancher for cataract in the right eye which is mild at this time as scheduled  Ophthalmic Meds Ordered this visit:  No orders of the defined types were placed in this encounter.      Return in about 9 months (around 08/24/2021) for DILATE OU, COLOR FP, OCT.  There are no Patient Instructions on file for this visit.   Explained the diagnoses, plan, and follow up with the patient and they expressed understanding.  Patient expressed understanding of the importance of proper follow up care.   Clent Demark Onnie Hatchel M.D. Diseases & Surgery of the Retina and Vitreous [facility]ina [facility] Diabetic [place] 11/24/20     Abbreviations: M myopia (nearsighted); A astigmatism; H hyperopia (farsighted); P presbyopia; Mrx spectacle prescription;  CTL contact lenses; OD right eye; OS left eye; OU both eyes  XT exotropia; ET esotropia; PEK punctate epithelial keratitis; PEE punctate epithelial erosions; DES dry eye syndrome; MGD meibomian gland dysfunction; ATs artificial tears; PFAT's preservative free artificial tears; [place] nuclear sclerotic cataract; PSC posterior subcapsular cataract; ERM epi-retinal membrane; PVD posterior vitreous detachment; RD retinal detachment; DM diabetes mellitus; DR diabetic  retinopathy; NPDR non-proliferative diabetic retinopathy; PDR proliferative diabetic retinopathy; CSME clinically significant macular edema; DME diabetic macular edema; dbh dot blot hemorrhages; CWS cotton wool spot; POAG primary open angle glaucoma; C/D cup-to-disc ratio; HVF humphrey visual field; GVF goldmann visual field; OCT optical coherence tomography; IOP intraocular pressure; BRVO Branch retinal vein occlusion; CRVO central retinal vein occlusion; CRAO central retinal artery occlusion; BRAO branch retinal artery occlusion; RT retinal tear; SB scleral buckle; PPV pars plana vitrectomy; VH Vitreous hemorrhage; PRP panretinal laser photocoagulation; IVK intravitreal kenalog; VMT vitreomacular traction; MH Macular hole;  NVD neovascularization of the disc; NVE neovascularization elsewhere; AREDS age related eye disease study; ARMD age related macular degeneration; POAG primary open angle glaucoma; EBMD epithelial/anterior basement membrane dystrophy; ACIOL anterior chamber intraocular lens; IOL intraocular lens; PCIOL posterior chamber intraocular lens; Phaco/IOL phacoemulsification with intraocular lens placement; [place] photorefractive keratectomy; LASIK laser assisted in situ keratomileusis; HTN hypertension; DM diabetes mellitus; COPD chronic obstructive pulmonary disease

## 2020-11-25 DIAGNOSIS — Z992 Dependence on renal dialysis: Secondary | ICD-10-CM | POA: Diagnosis not present

## 2020-11-25 DIAGNOSIS — N186 End stage renal disease: Secondary | ICD-10-CM | POA: Diagnosis not present

## 2020-11-26 DIAGNOSIS — Z992 Dependence on renal dialysis: Secondary | ICD-10-CM | POA: Diagnosis not present

## 2020-11-26 DIAGNOSIS — N186 End stage renal disease: Secondary | ICD-10-CM | POA: Diagnosis not present

## 2020-11-27 DIAGNOSIS — Z992 Dependence on renal dialysis: Secondary | ICD-10-CM | POA: Diagnosis not present

## 2020-11-27 DIAGNOSIS — N186 End stage renal disease: Secondary | ICD-10-CM | POA: Diagnosis not present

## 2020-11-28 DIAGNOSIS — Z992 Dependence on renal dialysis: Secondary | ICD-10-CM | POA: Diagnosis not present

## 2020-11-28 DIAGNOSIS — N186 End stage renal disease: Secondary | ICD-10-CM | POA: Diagnosis not present

## 2020-11-29 ENCOUNTER — Telehealth (HOSPITAL_COMMUNITY): Payer: Self-pay | Admitting: *Deleted

## 2020-11-29 DIAGNOSIS — Z992 Dependence on renal dialysis: Secondary | ICD-10-CM | POA: Diagnosis not present

## 2020-11-29 DIAGNOSIS — N186 End stage renal disease: Secondary | ICD-10-CM | POA: Diagnosis not present

## 2020-11-29 NOTE — Telephone Encounter (Signed)
Reviewed pt health history in preparation for orientation.  Yves Dill CES, ACSM 2:08 PM 11/29/2020

## 2020-11-30 ENCOUNTER — Other Ambulatory Visit: Payer: Self-pay

## 2020-11-30 ENCOUNTER — Encounter (HOSPITAL_COMMUNITY)
Admission: RE | Admit: 2020-11-30 | Discharge: 2020-11-30 | Disposition: A | Discharge status: Home / Self Care | Payer: Medicare Other | Source: Ambulatory Visit | Attending: Cardiology | Admitting: Cardiology

## 2020-11-30 VITALS — BP 130/80 | HR 89 | Ht 70.0 in | Wt 236.3 lb

## 2020-11-30 DIAGNOSIS — Z992 Dependence on renal dialysis: Secondary | ICD-10-CM | POA: Diagnosis not present

## 2020-11-30 DIAGNOSIS — Z7982 Long term (current) use of aspirin: Secondary | ICD-10-CM | POA: Insufficient documentation

## 2020-11-30 DIAGNOSIS — Z79899 Other long term (current) drug therapy: Secondary | ICD-10-CM | POA: Insufficient documentation

## 2020-11-30 DIAGNOSIS — I252 Old myocardial infarction: Secondary | ICD-10-CM | POA: Diagnosis not present

## 2020-11-30 DIAGNOSIS — Z5189 Encounter for other specified aftercare: Secondary | ICD-10-CM | POA: Diagnosis not present

## 2020-11-30 DIAGNOSIS — I214 Non-ST elevation (NSTEMI) myocardial infarction: Secondary | ICD-10-CM

## 2020-11-30 DIAGNOSIS — N186 End stage renal disease: Secondary | ICD-10-CM | POA: Diagnosis not present

## 2020-11-30 LAB — GLUCOSE, CAPILLARY: Glucose-Capillary: 144 mg/dL — ABNORMAL HIGH (ref 70–99)

## 2020-11-30 NOTE — Progress Notes (Signed)
Intermittent to frequent PVC's noted. Patient asymptomatic. VSS. Initial pre oxygen saturation 94% on room air. Post 6 minute walk test 89% on room air. Exit walk test SA02 97% on room air. [name] did report having shortness of breath during the walk test this resolved with rest. Lung fields clear upon ascultation. Vin Bhagat PAC paged and notified. No new orders received. Vin said that [place] may proceed with exercise at cardiac rehab.Will continue to monitor the patient throughout  the program. Barnet Pall, RN,BSN 11/30/2020 4:15 PM

## 2020-11-30 NOTE — Progress Notes (Signed)
Cardiac Rehab Medication Review by a Nurse  Does the patient  feel that his/her medications are working for him/her?  YES  Has the patient been experiencing any side effects to the medications prescribed?  NO  Does the patient measure his/her own blood pressure or blood glucose at home?  YES NO   Does the patient have any problems obtaining medications due to transportation or finances?   NO  Understanding of regimen: good Understanding of indications: good Potential of compliance: good    Nurse comments: Donald [name] is taking his medications as prescribed and has a good understanding of what his medications are for. Donald [name] has a CBG meter and a BP cuff. Donald [name] checks his BP's and CBG's at home.    Harrell Gave RN BSN 11/30/2020 3:16 PM

## 2020-12-01 DIAGNOSIS — Z992 Dependence on renal dialysis: Secondary | ICD-10-CM | POA: Diagnosis not present

## 2020-12-01 DIAGNOSIS — N186 End stage renal disease: Secondary | ICD-10-CM | POA: Diagnosis not present

## 2020-12-01 NOTE — Progress Notes (Signed)
Cardiac Individual Treatment Plan  Patient Details  Name: Donald [name] MRN: [id number] Date of Birth: [birth year] Referring Provider:   Flowsheet Row CARDIAC REHAB PHASE II ORIENTATION from 11/30/2020 in [facility]  Referring Provider Oswaldo Milian, MD       Initial Encounter Date:  [place] PHASE II ORIENTATION from 11/30/2020 in [facility]  Date 11/30/20       Visit Diagnosis: 11/30/20 NSTEMI   Patient's Home Medications on Admission:  Current Outpatient Medications:    aspirin EC 81 MG EC tablet, Take 1 tablet (81 mg total) by mouth daily., Disp: 30 tablet, Rfl: 3   calcium acetate (PHOSLO) 667 MG capsule, Take 667-2,001 mg by mouth See admin instructions. Take 3 capsules by mouth with each meal & take 1 capsule with snacks, Disp: , Rfl:    carvedilol (COREG) 12.5 MG tablet, Take 1 tablet (12.5 mg total) by mouth 2 (two) times daily with a meal., Disp: 180 tablet, Rfl: 3   cinacalcet (SENSIPAR) 30 MG tablet, Take by mouth daily., Disp: , Rfl:    clopidogrel (PLAVIX) 75 MG tablet, Take 1 tablet (75 mg total) by mouth daily., Disp: 30 tablet, Rfl: 3   Continuous Blood Gluc Sensor ([place]) MISC, Use to check blood sugar at least 4 times daily. Change sensor Q 2 wks, Disp: 2 each, Rfl: 12   gentamicin cream (GARAMYCIN) 0.1 %, Apply 1 application topically every morning. Apply to exit site every day, Disp: , Rfl:    insulin aspart (NOVOLOG) 100 UNIT/ML FlexPen, Inject 6 Units into the skin 3 (three) times daily with meals., Disp: 15 mL, Rfl: 11   insulin glargine (LANTUS SOLOSTAR) 100 UNIT/ML Solostar Pen, Inject 20 Units into the skin daily. (Patient taking differently: Inject 20 Units into the skin every evening.), Disp: 15 mL, Rfl: 11   Insulin Pen Needle (TRUEPLUS PEN NEEDLES) 31G X 6 MM MISC, Use to inject Novolog TID and Lantus once daily. Total injections in 1 day = 4.,  Disp: 100 each, Rfl: 6   Peritoneal Dialysis Solutions (DIALYSIS SOLUTION 2.5% LOW-MG/LOW-CA) 394 MOSM/L SOLN dianeal solution, As per nephro, Disp: , Rfl: 0   rosuvastatin (CRESTOR) 40 MG tablet, Take 40 mg by mouth in the morning., Disp: , Rfl:    senna (SENOKOT) 8.6 MG tablet, Take 2 tablets (17.2 mg total) by mouth daily. (Patient taking differently: Take 2 tablets by mouth daily as needed for constipation.), Disp: 30 tablet, Rfl: 3   oxyCODONE (ROXICODONE) 5 MG immediate release tablet, Take 1 tablet (5 mg total) by mouth every 4 (four) hours as needed for severe pain. (Patient not taking: Reported on 12/01/2020), Disp: 15 tablet, Rfl: 0  Past Medical History: Past Medical History:  Diagnosis Date   Diabetes mellitus without complication ([place])    History of vitrectomy 2014   anterior, left   Hx of heart bypass surgery    Hypertension    Renal disorder    dialysis     Tobacco Use: Social History   Tobacco Use  Smoking Status Never  Smokeless Tobacco Never    Labs: Recent Review Flowsheet Data     Labs for ITP Cardiac and Pulmonary Rehab Latest Ref Rng & Units 07/14/2019 07/15/2019 05/20/2020 06/01/2020 09/09/2020   Cholestrol 100 - 199 mg/dL 134 - - - 152   LDLCALC 0 - 99 mg/dL 72 - - - 82   HDL >39 mg/dL 37(L) - - -  43   Trlycerides 0 - 149 mg/dL 127 - - - 154(H)   Hemoglobin A1c 4.8 - 5.6 % - 6.1(H) 6.2 6.4(H) -       Capillary Blood Glucose: Lab Results  Component Value Date   GLUCAP 144 (H) 11/30/2020   GLUCAP 93 06/03/2020   GLUCAP 61 (L) 06/03/2020   GLUCAP 122 (H) 06/03/2020   GLUCAP 167 (H) 06/02/2020     Exercise Target Goals: Exercise Program Goal: Individual exercise prescription set using results from initial 6 min walk test and THRR while considering  patient's activity barriers and safety.   Exercise Prescription Goal: Starting with aerobic activity 30 plus minutes a day, 3 days per week for initial exercise prescription. Provide home exercise  prescription and guidelines that participant acknowledges understanding prior to discharge.  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 HR 99 bpm     Max Ex. BP 140/70     2 Minute Post BP 146/80  4 min post BP: 142/66              Oxygen Initial Assessment:   Oxygen Re-Evaluation:   Oxygen Discharge (Final Oxygen Re-Evaluation):   Initial Exercise Prescription:  Initial Exercise Prescription - 11/30/20 1530       Date of Initial Exercise RX and Referring Provider   Date 11/30/20    Referring Provider Oswaldo Milian, MD    Expected Discharge Date 01/28/21      NuStep   Level 2    SPM 75    Minutes 25    METs 2      Prescription Details   Frequency (times per week) 3    Duration Progress to 30 minutes of continuous aerobic without signs/symptoms of physical distress      Intensity   THRR 40-80% of Max Heartrate 69-138    Ratings of Perceived Exertion 11-13    Perceived Dyspnea 0-4      Progression   Progression Continue progressive overload as per policy without signs/symptoms or  physical distress.      Resistance Training   Training Prescription Yes    Weight 4 lbs    Reps 10-15             Perform Capillary Blood  Glucose checks as needed.  Exercise Prescription Changes:   Exercise Comments:   Exercise Goals and Review:   Exercise Goals     Row Name 11/30/20 1500             Exercise Goals   Increase Physical Activity Yes       Intervention Provide advice, education, support and counseling about physical activity/exercise needs.;Develop an individualized exercise prescription for aerobic and resistive training based on initial evaluation findings, risk stratification, comorbidities and participant's personal goals.       Expected Outcomes Long Term: Add in home exercise to make exercise part of routine and to increase amount of physical activity.;Long Term: Exercising regularly at least 3-5 days a week.;Short Term: Attend rehab on a regular basis to increase amount of physical activity.       Increase Strength and Stamina Yes       Intervention Provide advice, education, support and counseling about physical activity/exercise needs.;Develop an individualized exercise prescription for aerobic and resistive training based on initial evaluation findings, risk stratification, comorbidities and participant's personal goals.       Expected Outcomes Short Term: Increase workloads from initial exercise prescription for resistance, speed, and METs.;Short Term: Perform resistance training exercises routinely during rehab and add in resistance training at home;Long Term: Improve cardiorespiratory fitness, muscular endurance and strength as measured by increased METs and functional capacity (6MWT)       Able to understand and use rate of perceived exertion (RPE) scale Yes       Intervention Provide education and explanation on how to use RPE scale       Expected Outcomes Long Term:  Able to use RPE to guide intensity level when exercising independently;Short  Term: Able to use RPE daily in rehab to express subjective intensity level       Knowledge and understanding of Target Heart Rate Range (THRR) Yes       Intervention Provide education and explanation of THRR including how the numbers were predicted and where they are located for reference       Expected Outcomes Short Term: Able to state/look up THRR;Short Term: Able to use daily as guideline for intensity in rehab;Long Term: Able to use THRR to govern intensity when exercising independently       Understanding of Exercise Prescription Yes       Intervention Provide education, explanation, and written materials on patient's individual exercise prescription       Expected Outcomes Short Term: Able to explain program exercise prescription;Long Term: Able to explain home exercise prescription to exercise independently                Exercise Goals Re-Evaluation :    Discharge Exercise Prescription (Final Exercise Prescription Changes):   Nutrition:  Target Goals: Understanding of nutrition guidelines, daily intake of sodium '1500mg'$ , cholesterol '200mg'$ , calories 30% from fat and 7% or less from saturated fats, daily to have 5 or more servings of fruits and vegetables.  Biometrics:  Pre Biometrics - 11/30/20 1415       Pre Biometrics   Waist Circumference 48 inches    Hip Circumference 46.5 inches    Waist to Hip Ratio 1.03 %    Triceps Skinfold 15 mm    % Body Fat 32.8 %    Grip Strength 25 kg    Flexibility 13.75 in    Single Leg Stand 3 seconds              Nutrition  Therapy Plan and Nutrition Goals:   Nutrition Assessments:  MEDIFICTS Score Key: ?70 Need to make dietary changes  40-70 Heart Healthy Diet ? 40 Therapeutic Level Cholesterol Diet   Picture Your Plate Scores: D34-534 Unhealthy dietary pattern with much room for improvement. 41-50 Dietary pattern unlikely to meet recommendations for good health and room for improvement. 51-60 More healthful dietary  pattern, with some room for improvement.  >60 Healthy dietary pattern, although there may be some specific behaviors that could be improved.    Nutrition Goals Re-Evaluation:   Nutrition Goals Discharge (Final Nutrition Goals Re-Evaluation):   Psychosocial: Target Goals: Acknowledge presence or absence of significant depression and/or stress, maximize coping skills, provide positive support system. Participant is able to verbalize types and ability to use techniques and skills needed for reducing stress and depression.  Initial Review & Psychosocial Screening:  Initial Psych Review & Screening - 12/01/20 1017       Initial Review   Current issues with Current Stress Concerns    Source of Stress Concerns Chronic Illness    Comments Nirvan has health concerns due to ESRD and CAD      Family Dynamics   Good Support System? Yes   Wise has his wife and children for support     Barriers   Psychosocial barriers to participate in program The patient should benefit from training in stress management and relaxation.      Screening Interventions   Interventions Encouraged to exercise    Expected Outcomes Long Term Goal: Stressors or current issues are controlled or eliminated.;Short Term goal: Identification and review with participant of any Quality of Life or Depression concerns found by scoring the questionnaire.             Quality of Life Scores:  Quality of Life - 11/30/20 1602       Quality of Life   Select Quality of Life      Quality of Life Scores   Health/Function Pre 9 %    Socioeconomic Pre 15.67 %    Psych/Spiritual Pre 12.36 %    Family Pre 22.5 %    GLOBAL Pre 13.09 %            Scores of 19 and below usually indicate a poorer quality of life in these areas.  A difference of  2-3 points is a clinically meaningful difference.  A difference of 2-3 points in the total score of the Quality of Life Index has been associated with significant improvement in  overall quality of life, self-image, physical symptoms, and general health in studies assessing change in quality of life.  PHQ-9: Recent Review Flowsheet Data     Depression screen [facility] 2/9 12/01/2020 09/13/2020 08/23/2020 05/20/2020   Decreased Interest 0 '1 1 1   '$ Down, Depressed, Hopeless 0 0 0 1   PHQ - 2 Score 0 '1 1 2   '$ Altered sleeping - - - 3   Tired, decreased energy - - - 2   Change in appetite - - - 1   Feeling bad or failure about yourself  - - - 1   Trouble concentrating - - - 0   Moving slowly or fidgety/restless - - - 0   PHQ-9 Score - - - 9      Interpretation of Total Score  Total Score Depression Severity:  1-4 = Minimal depression, 5-9 = Mild depression, 10-14 = Moderate depression, 15-19 = Moderately severe depression, 20-27 = Severe depression   Psychosocial  Evaluation and Intervention:   Psychosocial Re-Evaluation:   Psychosocial Discharge (Final Psychosocial Re-Evaluation):   Vocational Rehabilitation: Provide vocational rehab assistance to qualifying candidates.   Vocational Rehab Evaluation & Intervention:  Vocational Rehab - 12/01/20 1021       Initial Vocational Rehab Evaluation & Intervention   Assessment shows need for Vocational Rehabilitation No   Decoda is on disabity and does not need vocational rehab at this time.            Education: Education Goals: Education classes will be provided on a weekly basis, covering required topics. Participant will state understanding/return demonstration of topics presented.  Learning Barriers/Preferences:  Learning Barriers/Preferences - 11/30/20 1603       Learning Barriers/Preferences   Learning Barriers None    Learning Preferences Pictoral;Skilled Demonstration;Individual Instruction;Group Instruction;Video             Education Topics: Hypertension, Hypertension Reduction -Define heart disease and high blood pressure. Discus how high blood pressure affects the body and ways to reduce  high blood pressure.   Exercise and Your Heart -Discuss why it is important to exercise, the FITT principles of exercise, normal and abnormal responses to exercise, and how to exercise safely.   Angina -Discuss definition of angina, causes of angina, treatment of angina, and how to decrease risk of having angina.   Cardiac Medications -Review what the following cardiac medications are used for, how they affect the body, and side effects that may occur when taking the medications.  Medications include Aspirin, Beta blockers, calcium channel blockers, ACE Inhibitors, angiotensin receptor blockers, diuretics, digoxin, and antihyperlipidemics.   Congestive Heart Failure -Discuss the definition of CHF, how to live with CHF, the signs and symptoms of CHF, and how keep track of weight and sodium intake.   Heart Disease and Intimacy -Discus the effect sexual activity has on the heart, how changes occur during intimacy as we age, and safety during sexual activity.   Smoking Cessation / COPD -Discuss different methods to quit smoking, the health benefits of quitting smoking, and the definition of COPD.   Nutrition I: Fats -Discuss the types of cholesterol, what cholesterol does to the heart, and how cholesterol levels can be controlled.   Nutrition II: Labels -Discuss the different components of food labels and how to read food label   Heart Parts/Heart Disease and PAD -Discuss the anatomy of the heart, the pathway of blood circulation through the heart, and these are affected by heart disease.   Stress I: Signs and Symptoms -Discuss the causes of stress, how stress may lead to anxiety and depression, and ways to limit stress.   Stress II: Relaxation -Discuss different types of relaxation techniques to limit stress.   Warning Signs of Stroke / TIA -Discuss definition of a stroke, what the signs and symptoms are of a stroke, and how to identify when someone is having  stroke.   Knowledge Questionnaire Score:  Knowledge Questionnaire Score - 11/30/20 1602       Knowledge Questionnaire Score   Pre Score 20/24             Core Components/Risk Factors/Patient Goals at Admission:  Personal Goals and Risk Factors at Admission - 11/30/20 1604       Core Components/Risk Factors/Patient Goals on Admission    Weight Management Yes;Obesity;Weight Loss    Intervention Weight Management: Develop a combined nutrition and exercise program designed to reach desired caloric intake, while maintaining appropriate intake of nutrient and fiber, sodium and fats,  and appropriate energy expenditure required for the weight goal.;Weight Management: Provide education and appropriate resources to help participant work on and attain dietary goals.;Weight Management/Obesity: Establish reasonable short term and long term weight goals.;Obesity: Provide education and appropriate resources to help participant work on and attain dietary goals.    Admit Weight 236 lb 5.3 oz (107.2 kg)    Expected Outcomes Short Term: Continue to assess and modify interventions until short term weight is achieved;Long Term: Adherence to nutrition and physical activity/exercise program aimed toward attainment of established weight goal;Weight Maintenance: Understanding of the daily nutrition guidelines, which includes 25-35% calories from fat, 7% or less cal from saturated fats, less than '200mg'$  cholesterol, less than 1.5gm of sodium, & 5 or more servings of fruits and vegetables daily;Weight Loss: Understanding of general recommendations for a balanced deficit meal plan, which promotes 1-2 lb weight loss per week and includes a negative energy balance of [phone] kcal/d;Understanding recommendations for meals to include 15-35% energy as protein, 25-35% energy from fat, 35-60% energy from carbohydrates, less than '200mg'$  of dietary cholesterol, 20-35 gm of total fiber daily;Understanding of distribution of  calorie intake throughout the day with the consumption of 4-5 meals/snacks    Diabetes Yes    Intervention Provide education about signs/symptoms and action to take for hypo/hyperglycemia.;Provide education about proper nutrition, including hydration, and aerobic/resistive exercise prescription along with prescribed medications to achieve blood glucose in normal ranges: Fasting glucose 65-99 mg/dL    Expected Outcomes Short Term: Participant verbalizes understanding of the signs/symptoms and immediate care of hyper/hypoglycemia, proper foot care and importance of medication, aerobic/resistive exercise and nutrition plan for blood glucose control.;Long Term: Attainment of HbA1C < 7%.    Heart Failure Yes    Intervention Provide a combined exercise and nutrition program that is supplemented with education, support and counseling about heart failure. Directed toward relieving symptoms such as shortness of breath, decreased exercise tolerance, and extremity edema.    Expected Outcomes Short term: Attendance in program 2-3 days a week with increased exercise capacity. Reported lower sodium intake. Reported increased fruit and vegetable intake. Reports medication compliance.;Improve functional capacity of life;Short term: Daily weights obtained and reported for increase. Utilizing diuretic protocols set by physician.;Long term: Adoption of self-care skills and reduction of barriers for early signs and symptoms recognition and intervention leading to self-care maintenance.    Hypertension Yes    Intervention Provide education on lifestyle modifcations including regular physical activity/exercise, weight management, moderate sodium restriction and increased consumption of fresh fruit, vegetables, and low fat dairy, alcohol moderation, and smoking cessation.;Monitor prescription use compliance.    Expected Outcomes Short Term: Continued assessment and intervention until BP is < 140/38m HG in hypertensive  participants. < 130/88mHG in hypertensive participants with diabetes, heart failure or chronic kidney disease.;Long Term: Maintenance of blood pressure at goal levels.    Lipids Yes    Intervention Provide education and support for participant on nutrition & aerobic/resistive exercise along with prescribed medications to achieve LDL '70mg'$ , HDL >'40mg'$ .    Expected Outcomes Short Term: Participant states understanding of desired cholesterol values and is compliant with medications prescribed. Participant is following exercise prescription and nutrition guidelines.;Long Term: Cholesterol controlled with medications as prescribed, with individualized exercise RX and with personalized nutrition plan. Value goals: LDL < '70mg'$ , HDL > 40 mg.    Stress Yes    Intervention Offer individual and/or small group education and counseling on adjustment to heart disease, stress management and health-related lifestyle change. Teach and support  self-help strategies.;Refer participants experiencing significant psychosocial distress to appropriate mental health specialists for further evaluation and treatment. When possible, include family members and significant others in education/counseling sessions.    Expected Outcomes Short Term: Participant demonstrates changes in health-related behavior, relaxation and other stress management skills, ability to obtain effective social support, and compliance with psychotropic medications if prescribed.;Long Term: Emotional wellbeing is indicated by absence of clinically significant psychosocial distress or social isolation.             Core Components/Risk Factors/Patient Goals Review:    Core Components/Risk Factors/Patient Goals at Discharge (Final Review):    ITP Comments:  ITP Comments     Row Name 12/01/20 0841           ITP Comments Dr Fransico Him MD, Medical Director                Comments: Junius Argyle attended orientation on 12/01/2020 to review rules and  guidelines for program.  Completed 6 minute walk test, Intitial ITP, and exercise prescription.  VSS. Telemetry-Sinus rhythm with an inverted T wave, PVC's frequent at times.  Kenan stopped twice due to deconditioning. Please see previous documentation as Kenen's oxygen saturations were noted at 89% and Kenen reported feeling short of breath this resolved with rest.. Safety measures and social distancing in place per CDC guidelines. Barnet Pall, RN,BSN 12/01/2020 10:33 AM

## 2020-12-02 DIAGNOSIS — Z992 Dependence on renal dialysis: Secondary | ICD-10-CM | POA: Diagnosis not present

## 2020-12-02 DIAGNOSIS — N186 End stage renal disease: Secondary | ICD-10-CM | POA: Diagnosis not present

## 2020-12-03 DIAGNOSIS — Z992 Dependence on renal dialysis: Secondary | ICD-10-CM | POA: Diagnosis not present

## 2020-12-03 DIAGNOSIS — N186 End stage renal disease: Secondary | ICD-10-CM | POA: Diagnosis not present

## 2020-12-04 DIAGNOSIS — N186 End stage renal disease: Secondary | ICD-10-CM | POA: Diagnosis not present

## 2020-12-04 DIAGNOSIS — Z992 Dependence on renal dialysis: Secondary | ICD-10-CM | POA: Diagnosis not present

## 2020-12-05 DIAGNOSIS — Z992 Dependence on renal dialysis: Secondary | ICD-10-CM | POA: Diagnosis not present

## 2020-12-05 DIAGNOSIS — N186 End stage renal disease: Secondary | ICD-10-CM | POA: Diagnosis not present

## 2020-12-06 ENCOUNTER — Encounter (HOSPITAL_COMMUNITY)
Admission: RE | Admit: 2020-12-06 | Discharge: 2020-12-06 | Disposition: A | Discharge status: Home / Self Care | Payer: Medicare Other | Source: Ambulatory Visit | Attending: Cardiology | Admitting: Cardiology

## 2020-12-06 ENCOUNTER — Other Ambulatory Visit: Payer: Self-pay

## 2020-12-06 DIAGNOSIS — N186 End stage renal disease: Secondary | ICD-10-CM | POA: Diagnosis not present

## 2020-12-06 DIAGNOSIS — Z7982 Long term (current) use of aspirin: Secondary | ICD-10-CM | POA: Diagnosis not present

## 2020-12-06 DIAGNOSIS — Z992 Dependence on renal dialysis: Secondary | ICD-10-CM | POA: Diagnosis not present

## 2020-12-06 DIAGNOSIS — I252 Old myocardial infarction: Secondary | ICD-10-CM | POA: Diagnosis not present

## 2020-12-06 DIAGNOSIS — Z5189 Encounter for other specified aftercare: Secondary | ICD-10-CM | POA: Diagnosis not present

## 2020-12-06 DIAGNOSIS — Z79899 Other long term (current) drug therapy: Secondary | ICD-10-CM | POA: Diagnosis not present

## 2020-12-06 DIAGNOSIS — I214 Non-ST elevation (NSTEMI) myocardial infarction: Secondary | ICD-10-CM

## 2020-12-06 NOTE — Progress Notes (Signed)
Daily Session Note  Patient Details  Name: Donald [name] MRN: [id number] Date of Birth: [birth year] Referring Provider:   Flowsheet Row CARDIAC REHAB PHASE II ORIENTATION from 11/30/2020 in [facility]  Referring Provider Oswaldo Milian, MD       Encounter Date: 12/06/2020  Check In:  Session Check In - 12/06/20 1331       Check-In   Supervising physician immediately available to respond to emergencies [facility] immediately available    Physician(s) Dr Doristine Bosworth    Location [facility]    Staff Present Lesly Rubenstein, MS, ACSM-CEP, CCRP, Exercise Physiologist;Aleathea Pugmire Wilber Oliphant, RN, Deland Pretty, MS, ACSM CEP, Exercise Physiologist;Jetta Walker BS, ACSM EP-C, Exercise Physiologist    Virtual Visit No    Medication changes reported     No    Fall or balance concerns reported    No    Tobacco Cessation No Change    Current number of cigarettes/nicotine per day     0    Warm-up and Cool-down Performed on first and last piece of equipment   Cardiac Rehab orientation   Resistance Training Performed No    VAD Patient? No    PAD/SET Patient? No      Pain Assessment   Currently in Pain? No/denies    Pain Score 0-No pain    Multiple Pain Sites No             Capillary Blood Glucose: No results found for this or any previous visit (from the past 24 hour(s)).    Social History   Tobacco Use  Smoking Status Never  Smokeless Tobacco Never    Goals Met:  Exercise tolerated well No report of concerns or symptoms today  Goals Unmet:  Not Applicable  Comments: Pt started cardiac rehab today.  Pt tolerated light exercise without difficulty. VSS, telemetry- SR with inverted T wave rare PVC, asymptomatic.  Medication list reconciled. Pt denies barriers to medication compliance. Pt oriented to exercise equipment and routine.  Understanding verbalized. Maurice Small RN, BSN Cardiac and Pulmonary Rehab Nurse  Navigator     Dr. Fransico Him is Medical Director for Cardiac Rehab at [facility].

## 2020-12-07 DIAGNOSIS — N186 End stage renal disease: Secondary | ICD-10-CM | POA: Diagnosis not present

## 2020-12-07 DIAGNOSIS — Z992 Dependence on renal dialysis: Secondary | ICD-10-CM | POA: Diagnosis not present

## 2020-12-07 LAB — GLUCOSE, CAPILLARY
Glucose-Capillary: 118 mg/dL — ABNORMAL HIGH (ref 70–99)
Glucose-Capillary: 113 mg/dL — ABNORMAL HIGH (ref 70–99)

## 2020-12-08 ENCOUNTER — Other Ambulatory Visit: Payer: Self-pay

## 2020-12-08 ENCOUNTER — Encounter (HOSPITAL_COMMUNITY)
Admission: RE | Admit: 2020-12-08 | Discharge: 2020-12-08 | Disposition: A | Discharge status: Home / Self Care | Payer: Medicare Other | Source: Ambulatory Visit | Attending: Cardiology | Admitting: Cardiology

## 2020-12-08 DIAGNOSIS — N186 End stage renal disease: Secondary | ICD-10-CM | POA: Diagnosis not present

## 2020-12-08 DIAGNOSIS — I214 Non-ST elevation (NSTEMI) myocardial infarction: Secondary | ICD-10-CM

## 2020-12-08 DIAGNOSIS — Z992 Dependence on renal dialysis: Secondary | ICD-10-CM | POA: Diagnosis not present

## 2020-12-08 DIAGNOSIS — Z5189 Encounter for other specified aftercare: Secondary | ICD-10-CM | POA: Diagnosis not present

## 2020-12-08 DIAGNOSIS — Z7982 Long term (current) use of aspirin: Secondary | ICD-10-CM | POA: Diagnosis not present

## 2020-12-08 DIAGNOSIS — I252 Old myocardial infarction: Secondary | ICD-10-CM | POA: Diagnosis not present

## 2020-12-08 DIAGNOSIS — Z79899 Other long term (current) drug therapy: Secondary | ICD-10-CM | POA: Diagnosis not present

## 2020-12-08 NOTE — Progress Notes (Signed)
QUALITY OF LIFE SCORE REVIEW  Pt completed Quality of Life survey as a participant in Cardiac Rehab.  Scores 21.0 or below are considered low.  Pt score very low in several areas Overall 13.09, Health and Function 9.0, socioeconomic 15.67, physiological and spiritual 12.36, family 22.50. Patient quality of life slightly altered by physical constraints which limits ability to perform as prior to recent cardiac illness.  Spoke with [place] who states that he has stressors regarding finances.  Presently he is not working and his wife is financially managing the household.  Donald [name] unsure if he can return to work as it is mentally stressful and physically challenging. Pt feels the "man should be the main breadwinner of the home". He struggles with this even though his wife is supportive and does not pressure him about financially contributing.  Donald [name] also feels his 49 year old is overly dependent on him and not appreciative of what he does.  [name] drives her to work and takes care of her daughter while she is at work. [name] asked for 50 dollars a week and this upset his daughter and they ended up disagreeing.  [name] daughter lives with them and is not asked to pay for any of the household expense.  She does have [place] and is able to use that to help with some of the groceries. [name] also pays for her cell phone bill. On one hand he understands but wishes she would be more understanding.  Offered emotional support and reassurance.  Will continue to monitor and intervene as necessary.  Cherre Huger, BSN Cardiac and Training and development officer

## 2020-12-09 DIAGNOSIS — N186 End stage renal disease: Secondary | ICD-10-CM | POA: Diagnosis not present

## 2020-12-09 DIAGNOSIS — Z992 Dependence on renal dialysis: Secondary | ICD-10-CM | POA: Diagnosis not present

## 2020-12-09 LAB — GLUCOSE, CAPILLARY: Glucose-Capillary: 130 mg/dL — ABNORMAL HIGH (ref 70–99)

## 2020-12-10 ENCOUNTER — Encounter (HOSPITAL_COMMUNITY): Payer: Medicare Other

## 2020-12-10 DIAGNOSIS — N186 End stage renal disease: Secondary | ICD-10-CM | POA: Diagnosis not present

## 2020-12-10 DIAGNOSIS — Z992 Dependence on renal dialysis: Secondary | ICD-10-CM | POA: Diagnosis not present

## 2020-12-11 DIAGNOSIS — N186 End stage renal disease: Secondary | ICD-10-CM | POA: Diagnosis not present

## 2020-12-11 DIAGNOSIS — Z992 Dependence on renal dialysis: Secondary | ICD-10-CM | POA: Diagnosis not present

## 2020-12-12 DIAGNOSIS — N186 End stage renal disease: Secondary | ICD-10-CM | POA: Diagnosis not present

## 2020-12-12 DIAGNOSIS — Z992 Dependence on renal dialysis: Secondary | ICD-10-CM | POA: Diagnosis not present

## 2020-12-13 DIAGNOSIS — Z992 Dependence on renal dialysis: Secondary | ICD-10-CM | POA: Diagnosis not present

## 2020-12-13 DIAGNOSIS — N186 End stage renal disease: Secondary | ICD-10-CM | POA: Diagnosis not present

## 2020-12-14 DIAGNOSIS — Z992 Dependence on renal dialysis: Secondary | ICD-10-CM | POA: Diagnosis not present

## 2020-12-14 DIAGNOSIS — N186 End stage renal disease: Secondary | ICD-10-CM | POA: Diagnosis not present

## 2020-12-15 ENCOUNTER — Encounter (HOSPITAL_COMMUNITY)
Admission: RE | Admit: 2020-12-15 | Discharge: 2020-12-15 | Disposition: A | Discharge status: Home / Self Care | Payer: Medicare Other | Source: Ambulatory Visit | Attending: Cardiology | Admitting: Cardiology

## 2020-12-15 ENCOUNTER — Other Ambulatory Visit: Payer: Self-pay

## 2020-12-15 DIAGNOSIS — N186 End stage renal disease: Secondary | ICD-10-CM | POA: Diagnosis not present

## 2020-12-15 DIAGNOSIS — I214 Non-ST elevation (NSTEMI) myocardial infarction: Secondary | ICD-10-CM | POA: Diagnosis not present

## 2020-12-15 DIAGNOSIS — Z992 Dependence on renal dialysis: Secondary | ICD-10-CM | POA: Diagnosis not present

## 2020-12-15 LAB — GLUCOSE, CAPILLARY
Glucose-Capillary: 137 mg/dL — ABNORMAL HIGH (ref 70–99)
Glucose-Capillary: 112 mg/dL — ABNORMAL HIGH (ref 70–99)

## 2020-12-15 NOTE — Progress Notes (Signed)
Donald [name] 50 y.o. male Nutrition Note  Diagnosis: NSTEMI   Past Medical History:  Diagnosis Date   Diabetes mellitus without complication ([place])    History of vitrectomy 2014   anterior, left   Hx of heart bypass surgery    Hypertension    Renal disorder    dialysis      Medications reviewed.   Current Outpatient Medications:    aspirin EC 81 MG EC tablet, Take 1 tablet (81 mg total) by mouth daily., Disp: 30 tablet, Rfl: 3   calcium acetate (PHOSLO) 667 MG capsule, Take 667-2,001 mg by mouth See admin instructions. Take 3 capsules by mouth with each meal & take 1 capsule with snacks, Disp: , Rfl:    carvedilol (COREG) 12.5 MG tablet, Take 1 tablet (12.5 mg total) by mouth 2 (two) times daily with a meal., Disp: 180 tablet, Rfl: 3   cinacalcet (SENSIPAR) 30 MG tablet, Take by mouth daily., Disp: , Rfl:    clopidogrel (PLAVIX) 75 MG tablet, Take 1 tablet (75 mg total) by mouth daily., Disp: 30 tablet, Rfl: 3   Continuous Blood Gluc Sensor ([place]) MISC, Use to check blood sugar at least 4 times daily. Change sensor Q 2 wks, Disp: 2 each, Rfl: 12   gentamicin cream (GARAMYCIN) 0.1 %, Apply 1 application topically every morning. Apply to exit site every day, Disp: , Rfl:    insulin aspart (NOVOLOG) 100 UNIT/ML FlexPen, Inject 6 Units into the skin 3 (three) times daily with meals., Disp: 15 mL, Rfl: 11   insulin glargine (LANTUS SOLOSTAR) 100 UNIT/ML Solostar Pen, Inject 20 Units into the skin daily. (Patient taking differently: Inject 20 Units into the skin every evening.), Disp: 15 mL, Rfl: 11   Insulin Pen Needle (TRUEPLUS PEN NEEDLES) 31G X 6 MM MISC, Use to inject Novolog TID and Lantus once daily. Total injections in 1 day = 4., Disp: 100 each, Rfl: 6   oxyCODONE (ROXICODONE) 5 MG immediate release tablet, Take 1 tablet (5 mg total) by mouth every 4 (four) hours as needed for severe pain. (Patient not taking: Reported on 12/01/2020), Disp: 15 tablet, Rfl:  0   Peritoneal Dialysis Solutions (DIALYSIS SOLUTION 2.5% LOW-MG/LOW-CA) 394 MOSM/L SOLN dianeal solution, As per nephro, Disp: , Rfl: 0   rosuvastatin (CRESTOR) 40 MG tablet, Take 40 mg by mouth in the morning., Disp: , Rfl:    senna (SENOKOT) 8.6 MG tablet, Take 2 tablets (17.2 mg total) by mouth daily. (Patient taking differently: Take 2 tablets by mouth daily as needed for constipation.), Disp: 30 tablet, Rfl: 3   Ht Readings from Last 1 Encounters:  11/30/20 '5\' 10"'$  (1.778 m)     Wt Readings from Last 3 Encounters:  11/30/20 236 lb 5.3 oz (107.2 kg)  09/13/20 235 lb (106.6 kg)  09/09/20 232 lb 9.6 oz (105.5 kg)     There is no height or weight on file to calculate BMI.   Social History   Tobacco Use  Smoking Status Never  Smokeless Tobacco Never     Lab Results  Component Value Date   CHOL 152 09/09/2020   Lab Results  Component Value Date   HDL 43 09/09/2020   Lab Results  Component Value Date   LDLCALC 82 09/09/2020   Lab Results  Component Value Date   TRIG 154 (H) 09/09/2020     Lab Results  Component Value Date   HGBA1C 6.4 (H) 06/01/2020     CBG (  last 3)  No results for input(s): GLUCAP in the last 72 hours.   Nutrition Note  Spoke with pt. Nutrition Plan and Nutrition Survey goals reviewed with pt. Pt is following a Heart Healthy diet.  Pt has Type 2 Diabetes. Last A1c indicates blood glucose well-controlled. Pt has freestyle libre CGM system. He reports fasting CBGs 130-148 mg/dl. Evening CBGs 150-160 mg/dl. He feels comfortable with his diabetes management.   Pt with dx of CHF. Per discussion, pt does not use canned/convenience foods often. Pt does not add salt to food. Pt does not eat out frequently.  He is on PD. He sees a dietitian once per month. He has been instructed to increase protein in diet by drinking protein shake 3 days per week. He says he is on 32 oz fluid restriction and a sodium restriction.  Noted LDL 82 mg/dl. Pt eats  fruits,vegetables, healthy fats, and whole grains. He does not eat beans often. We discussed eating adequate soluble fiber each day. Pt amenable to trying beans more often.  Pt expressed understanding of the information reviewed.    Nutrition Diagnosis Food-and nutrition-related knowledge deficit related to lack of exposure to information as related to diagnosis of: ? CVD ? Type 2 Diabetes  Nutrition Intervention Pt's individual nutrition plan reviewed with pt. Benefits of adopting Heart Healthy diet discussed when Picture Your Plate reviewed.   Pt given handouts for: ? Nutrition I class ? Nutrition II class ?  Continue client-centered nutrition education by RD, as part of interdisciplinary care.  Goal(s) Pt to build a healthy plate including vegetables, fruits, whole grains, and low-fat dairy products in a heart healthy meal plan. Pt to eat a variety of non-starchy vegetables. Pt to eat more beans and peas   Plan:  Will provide client-centered nutrition education as part of interdisciplinary care Monitor and evaluate progress toward nutrition goal with team.   Michaele Offer, MS, RDN, LDN, CDCES

## 2020-12-16 DIAGNOSIS — Z992 Dependence on renal dialysis: Secondary | ICD-10-CM | POA: Diagnosis not present

## 2020-12-16 DIAGNOSIS — N186 End stage renal disease: Secondary | ICD-10-CM | POA: Diagnosis not present

## 2020-12-17 ENCOUNTER — Encounter (HOSPITAL_COMMUNITY)
Admission: RE | Admit: 2020-12-17 | Discharge: 2020-12-17 | Disposition: A | Discharge status: Home / Self Care | Payer: Medicare Other | Source: Ambulatory Visit | Attending: Cardiology | Admitting: Cardiology

## 2020-12-17 ENCOUNTER — Other Ambulatory Visit: Payer: Self-pay

## 2020-12-17 DIAGNOSIS — I214 Non-ST elevation (NSTEMI) myocardial infarction: Secondary | ICD-10-CM | POA: Diagnosis not present

## 2020-12-17 DIAGNOSIS — Z992 Dependence on renal dialysis: Secondary | ICD-10-CM | POA: Diagnosis not present

## 2020-12-17 DIAGNOSIS — N186 End stage renal disease: Secondary | ICD-10-CM | POA: Diagnosis not present

## 2020-12-19 DIAGNOSIS — N186 End stage renal disease: Secondary | ICD-10-CM | POA: Diagnosis not present

## 2020-12-19 DIAGNOSIS — Z992 Dependence on renal dialysis: Secondary | ICD-10-CM | POA: Diagnosis not present

## 2020-12-20 ENCOUNTER — Encounter (HOSPITAL_COMMUNITY)
Admission: RE | Admit: 2020-12-20 | Discharge: 2020-12-20 | Disposition: A | Discharge status: Home / Self Care | Payer: Medicare Other | Source: Ambulatory Visit | Attending: Cardiology | Admitting: Cardiology

## 2020-12-20 ENCOUNTER — Other Ambulatory Visit: Payer: Self-pay

## 2020-12-20 DIAGNOSIS — N186 End stage renal disease: Secondary | ICD-10-CM | POA: Diagnosis not present

## 2020-12-20 DIAGNOSIS — I214 Non-ST elevation (NSTEMI) myocardial infarction: Secondary | ICD-10-CM

## 2020-12-20 DIAGNOSIS — Z992 Dependence on renal dialysis: Secondary | ICD-10-CM | POA: Diagnosis not present

## 2020-12-21 DIAGNOSIS — D509 Iron deficiency anemia, unspecified: Secondary | ICD-10-CM | POA: Diagnosis not present

## 2020-12-21 DIAGNOSIS — N186 End stage renal disease: Secondary | ICD-10-CM | POA: Diagnosis not present

## 2020-12-21 DIAGNOSIS — Z992 Dependence on renal dialysis: Secondary | ICD-10-CM | POA: Diagnosis not present

## 2020-12-21 NOTE — Progress Notes (Signed)
Cardiac Individual Treatment Plan  Patient Details  Name: Donald [name] MRN: [id number] Date of Birth: 23-Jan-1971 Referring Provider:   Flowsheet Row CARDIAC REHAB PHASE II ORIENTATION from 11/30/2020 in [place]  Referring Provider Oswaldo Milian, MD       Initial Encounter Date:  [facility] PHASE II ORIENTATION from 11/30/2020 in [place]  Date 11/30/20       Visit Diagnosis: 11/30/20 NSTEMI   Patient's Home Medications on Admission:  Current Outpatient Medications:    aspirin EC 81 MG EC tablet, Take 1 tablet (81 mg total) by mouth daily., Disp: 30 tablet, Rfl: 3   calcium acetate (PHOSLO) 667 MG capsule, Take 667-2,001 mg by mouth See admin instructions. Take 3 capsules by mouth with each meal & take 1 capsule with snacks, Disp: , Rfl:    carvedilol (COREG) 12.5 MG tablet, Take 1 tablet (12.5 mg total) by mouth 2 (two) times daily with a meal., Disp: 180 tablet, Rfl: 3   cinacalcet (SENSIPAR) 30 MG tablet, Take by mouth daily., Disp: , Rfl:    clopidogrel (PLAVIX) 75 MG tablet, Take 1 tablet (75 mg total) by mouth daily., Disp: 30 tablet, Rfl: 3   Continuous Blood Gluc Sensor ([place]) MISC, Use to check blood sugar at least 4 times daily. Change sensor Q 2 wks, Disp: 2 each, Rfl: 12   gentamicin cream (GARAMYCIN) 0.1 %, Apply 1 application topically every morning. Apply to exit site every day, Disp: , Rfl:    insulin aspart (NOVOLOG) 100 UNIT/ML FlexPen, Inject 6 Units into the skin 3 (three) times daily with meals., Disp: 15 mL, Rfl: 11   insulin glargine (LANTUS SOLOSTAR) 100 UNIT/ML Solostar Pen, Inject 20 Units into the skin daily. (Patient taking differently: Inject 20 Units into the skin every evening.), Disp: 15 mL, Rfl: 11   Insulin Pen Needle (TRUEPLUS PEN NEEDLES) 31G X 6 MM MISC, Use to inject Novolog TID and Lantus once daily. Total injections in 1 day = 4.,  Disp: 100 each, Rfl: 6   oxyCODONE (ROXICODONE) 5 MG immediate release tablet, Take 1 tablet (5 mg total) by mouth every 4 (four) hours as needed for severe pain. (Patient not taking: Reported on 12/01/2020), Disp: 15 tablet, Rfl: 0   Peritoneal Dialysis Solutions (DIALYSIS SOLUTION 2.5% LOW-MG/LOW-CA) 394 MOSM/L SOLN dianeal solution, As per nephro, Disp: , Rfl: 0   rosuvastatin (CRESTOR) 40 MG tablet, Take 40 mg by mouth in the morning., Disp: , Rfl:    senna (SENOKOT) 8.6 MG tablet, Take 2 tablets (17.2 mg total) by mouth daily. (Patient taking differently: Take 2 tablets by mouth daily as needed for constipation.), Disp: 30 tablet, Rfl: 3  Past Medical History: Past Medical History:  Diagnosis Date   Diabetes mellitus without complication ([place])    History of vitrectomy 2014   anterior, left   Hx of heart bypass surgery    Hypertension    Renal disorder    dialysis     Tobacco Use: Social History   Tobacco Use  Smoking Status Never  Smokeless Tobacco Never    Labs: Recent Review Flowsheet Data     Labs for ITP Cardiac and Pulmonary Rehab Latest Ref Rng & Units 07/14/2019 07/15/2019 05/20/2020 06/01/2020 09/09/2020   Cholestrol 100 - 199 mg/dL 134 - - - 152   LDLCALC 0 - 99 mg/dL 72 - - - 82   HDL >39 mg/dL 37(L) - - -  43   Trlycerides 0 - 149 mg/dL 127 - - - 154(H)   Hemoglobin A1c 4.8 - 5.6 % - 6.1(H) 6.2 6.4(H) -       Capillary Blood Glucose: Lab Results  Component Value Date   GLUCAP 112 (H) 12/15/2020   GLUCAP 137 (H) 12/15/2020   GLUCAP 130 (H) 12/08/2020   GLUCAP 113 (H) 12/06/2020   GLUCAP 118 (H) 12/06/2020     Exercise Target Goals: Exercise Program Goal: Individual exercise prescription set using results from initial 6 min walk test and THRR while considering  patient's activity barriers and safety.   Exercise Prescription Goal: Starting with aerobic activity 30 plus minutes a day, 3 days per week for initial exercise prescription. Provide home exercise  prescription and guidelines that participant acknowledges understanding prior to discharge.  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 HR 99 bpm     Max Ex. BP 140/70     2 Minute Post BP 146/80  4 min post BP: 142/66              Oxygen Initial Assessment:   Oxygen Re-Evaluation:   Oxygen Discharge (Final Oxygen Re-Evaluation):   Initial Exercise Prescription:  Initial Exercise Prescription - 11/30/20 1530       Date of Initial Exercise RX and Referring Provider   Date 11/30/20    Referring Provider Oswaldo Milian, MD    Expected Discharge Date 01/28/21      NuStep   Level 2    SPM 75    Minutes 25    METs 2      Prescription Details   Frequency (times per week) 3    Duration Progress to 30 minutes of continuous aerobic without signs/symptoms of physical distress      Intensity   THRR 40-80% of Max Heartrate 69-138    Ratings of Perceived Exertion 11-13    Perceived Dyspnea 0-4      Progression   Progression Continue progressive overload as per policy without signs/symptoms or  physical distress.      Resistance Training   Training Prescription Yes    Weight 4 lbs    Reps 10-15             Perform Capillary  Blood Glucose checks as needed.  Exercise Prescription Changes:   Exercise Prescription Changes     Row Name 12/06/20 1500             Response to Exercise   Blood Pressure (Admit) 146/70       Blood Pressure (Exercise) 144/78       Blood Pressure (Exit) 148/62       Heart Rate (Admit) 88 bpm       Heart Rate (Exercise) 104 bpm       Heart Rate (Exit) 89 bpm       Rating of Perceived Exertion (Exercise) 11       Symptoms None       Comments Pt's first day in the CRP2 program       Duration Progress to 30 minutes of  aerobic without signs/symptoms of physical distress       Intensity THRR unchanged               Progression   Progression Continue to progress workloads to maintain intensity without signs/symptoms of physical distress.       Average METs 1.6               Resistance Training   Training Prescription Yes       Weight 4 lbs       Reps 10-15       Time 10 Minutes               Interval Training   Interval Training No               NuStep   Level 2       SPM 75       Minutes 25       METs 1.6                Exercise Comments:   Exercise Comments     Row Name 12/06/20 1500 12/17/20 1543 12/20/20 1541       Exercise Comments Pt's first day in the CRP2 program. No complaints with todays session. Reviewed MET. Pt has increased level to 3. Continue to encourage increased SPM to improve avg MET level, --              Exercise Goals and Review:   Exercise Goals     Row Name 11/30/20 1500             Exercise Goals   Increase Physical Activity Yes       Intervention Provide advice, education, support and counseling about physical activity/exercise needs.;Develop an individualized exercise prescription for aerobic and resistive training based on initial evaluation findings, risk stratification,  comorbidities and participant's personal goals.       Expected Outcomes Long Term: Add in home exercise to make exercise part of routine and to increase amount of physical activity.;Long Term: Exercising regularly at least 3-5 days a week.;Short Term: Attend rehab on a regular basis to increase amount of physical activity.       Increase Strength and Stamina Yes       Intervention Provide advice, education, support and counseling about physical activity/exercise needs.;Develop an individualized exercise prescription for aerobic and resistive training based on initial evaluation findings, risk stratification, comorbidities and participant's personal goals.       Expected Outcomes Short Term: Increase workloads from initial exercise prescription for resistance, speed, and METs.;Short Term: Perform resistance training exercises routinely during rehab and add in resistance training  at home;Long Term: Improve cardiorespiratory fitness, muscular endurance and strength as measured by increased METs and functional capacity (6MWT)       Able to understand and use rate of perceived exertion (RPE) scale Yes       Intervention Provide education and explanation on how to use RPE scale       Expected Outcomes Long Term:  Able to use RPE to guide intensity level when exercising independently;Short Term: Able to use RPE daily in rehab to express subjective intensity level       Knowledge and understanding of Target Heart Rate Range (THRR) Yes       Intervention Provide education and explanation of THRR including how the numbers were predicted and where they are located for reference       Expected Outcomes Short Term: Able to state/look up THRR;Short Term: Able to use daily as guideline for intensity in rehab;Long Term: Able to use THRR to govern intensity when exercising independently       Understanding of Exercise Prescription Yes       Intervention Provide education, explanation, and written materials on patient's  individual exercise prescription       Expected Outcomes Short Term: Able to explain program exercise prescription;Long Term: Able to explain home exercise prescription to exercise independently                Exercise Goals Re-Evaluation :  Exercise Goals Re-Evaluation     Row Name 12/06/20 1500 12/07/20 0747           Exercise Goal Re-Evaluation   Exercise Goals Review Increase Physical Activity;Increase Strength and Stamina;Able to understand and use rate of perceived exertion (RPE) scale;Knowledge and understanding of Target Heart Rate Range (THRR);Understanding of Exercise Prescription --      Comments Pt's first day in the CRP2 program. Pt understands the exercise Rx, THRR and RPE scale. --      Expected Outcomes Will continue to [place] patient and progress exercise workloads as tolerated. --                Discharge Exercise Prescription (Final Exercise Prescription Changes):  Exercise Prescription Changes - 12/06/20 1500       Response to Exercise   Blood Pressure (Admit) 146/70    Blood Pressure (Exercise) 144/78    Blood Pressure (Exit) 148/62    Heart Rate (Admit) 88 bpm    Heart Rate (Exercise) 104 bpm    Heart Rate (Exit) 89 bpm    Rating of Perceived Exertion (Exercise) 11    Symptoms None    Comments Pt's first day in the CRP2 program    Duration Progress to 30 minutes of  aerobic without signs/symptoms of physical distress    Intensity THRR unchanged      Progression   Progression Continue to progress workloads to maintain intensity without signs/symptoms of physical distress.    Average METs 1.6      Resistance Training   Training Prescription Yes    Weight 4 lbs    Reps 10-15    Time 10 Minutes      Interval Training   Interval Training No      NuStep   Level 2    SPM 75    Minutes 25    METs 1.6             Nutrition:  Target Goals: Understanding of nutrition guidelines, daily intake of sodium <1549m, cholesterol <2084m  calories 30% from fat and  7% or less from saturated fats, daily to have 5 or more servings of fruits and vegetables.  Biometrics:  Pre Biometrics - 11/30/20 1415       Pre Biometrics   Waist Circumference 48 inches    Hip Circumference 46.5 inches    Waist to Hip Ratio 1.03 %    Triceps Skinfold 15 mm    % Body Fat 32.8 %    Grip Strength 25 kg    Flexibility 13.75 in    Single Leg Stand 3 seconds              Nutrition Therapy Plan and Nutrition Goals:  Nutrition Therapy & Goals - 12/15/20 1442       Nutrition Therapy   Diet TLC    Drug/Food Interactions Statins/Certain Fruits      Personal Nutrition Goals   Nutrition Goal Pt to build a healthy plate including vegetables, fruits, whole grains, and low-fat dairy products in a heart healthy meal plan.    Personal Goal #2 Pt to eat a variety of non-starchy vegetables.    Personal Goal #3 Pt to eat more beans and peas      Intervention Plan   Intervention Nutrition handout(s) given to patient.;Prescribe, educate and counsel regarding individualized specific dietary modifications aiming towards targeted core components such as weight, hypertension, lipid management, diabetes, heart failure and other comorbidities.    Expected Outcomes Long Term Goal: Adherence to prescribed nutrition plan.;Short Term Goal: Understand basic principles of dietary content, such as calories, fat, sodium, cholesterol and nutrients.             Nutrition Assessments:  MEDIFICTS Score Key: ?70 Need to make dietary changes  40-70 Heart Healthy Diet ? 40 Therapeutic Level Cholesterol Diet  Flowsheet Row CARDIAC REHAB PHASE II EXERCISE from 12/15/2020 in [place]  Picture Your Plate Total Score on Admission 65      Picture Your Plate Scores: <93 Unhealthy dietary pattern with much room for improvement. 41-50 Dietary pattern unlikely to meet recommendations for good health and room for improvement. 51-60  More healthful dietary pattern, with some room for improvement.  >60 Healthy dietary pattern, although there may be some specific behaviors that could be improved.    Nutrition Goals Re-Evaluation:  Nutrition Goals Re-Evaluation     [place] Name 12/15/20 1443 12/20/20 0955           Goals   Current Weight 236 lb (107 kg) 238 lb 5.1 oz (108.1 kg)      Nutrition Goal Pt to build a healthy plate including vegetables, fruits, whole grains, and low-fat dairy products in a heart healthy meal plan. Pt to build a healthy plate including vegetables, fruits, whole grains, and low-fat dairy products in a heart healthy meal plan.             Personal Goal #2 Re-Evaluation   Personal Goal #2 Pt to eat a variety of non-starchy vegetables. Pt to eat a variety of non-starchy vegetables.             Personal Goal #3 Re-Evaluation   Personal Goal #3 Pt to eat more beans and peas Pt to eat more beans and peas               Nutrition Goals Discharge (Final Nutrition Goals Re-Evaluation):  Nutrition Goals Re-Evaluation - 12/20/20 0955       Goals   Current Weight 238 lb 5.1 oz (108.1 kg)  Nutrition Goal Pt to build a healthy plate including vegetables, fruits, whole grains, and low-fat dairy products in a heart healthy meal plan.      Personal Goal #2 Re-Evaluation   Personal Goal #2 Pt to eat a variety of non-starchy vegetables.      Personal Goal #3 Re-Evaluation   Personal Goal #3 Pt to eat more beans and peas             Psychosocial: Target Goals: Acknowledge presence or absence of significant depression and/or stress, maximize coping skills, provide positive support system. Participant is able to verbalize types and ability to use techniques and skills needed for reducing stress and depression.  Initial Review & Psychosocial Screening:  Initial Psych Review & Screening - 12/01/20 1017       Initial Review   Current issues with Current Stress Concerns    Source of Stress  Concerns Chronic Illness    Comments Yosmar has health concerns due to ESRD and CAD      Family Dynamics   Good Support System? Yes   Martez has his wife and children for support     Barriers   Psychosocial barriers to participate in program The patient should benefit from training in stress management and relaxation.      Screening Interventions   Interventions Encouraged to exercise    Expected Outcomes Long Term Goal: Stressors or current issues are controlled or eliminated.;Short Term goal: Identification and review with participant of any Quality of Life or Depression concerns found by scoring the questionnaire.             Quality of Life Scores:  Quality of Life - 11/30/20 1602       Quality of Life   Select Quality of Life      Quality of Life Scores   Health/Function Pre 9 %    Socioeconomic Pre 15.67 %    Psych/Spiritual Pre 12.36 %    Family Pre 22.5 %    GLOBAL Pre 13.09 %            Scores of 19 and below usually indicate a poorer quality of life in these areas.  A difference of  2-3 points is a clinically meaningful difference.  A difference of 2-3 points in the total score of the Quality of Life Index has been associated with significant improvement in overall quality of life, self-image, physical symptoms, and general health in studies assessing change in quality of life.  PHQ-9: Recent Review Flowsheet Data     Depression screen [facility] 2/9 12/01/2020 09/13/2020 08/23/2020 05/20/2020   Decreased Interest 0 1 1 1    Down, Depressed, Hopeless 0 0 0 1   PHQ - 2 Score 0 1 1 2    Altered sleeping - - - 3   Tired, decreased energy - - - 2   Change in appetite - - - 1   Feeling bad or failure about yourself  - - - 1   Trouble concentrating - - - 0   Moving slowly or fidgety/restless - - - 0   PHQ-9 Score - - - 9      Interpretation of Total Score  Total Score Depression Severity:  1-4 = Minimal depression, 5-9 = Mild depression, 10-14 = Moderate depression,  15-19 = Moderately severe depression, 20-27 = Severe depression   Psychosocial Evaluation and Intervention:   Psychosocial Re-Evaluation:  Psychosocial Re-Evaluation     [place] Name 12/21/20 1520  Psychosocial Re-Evaluation   Current issues with Current Stress Concerns       Comments Pal continues to voice concerns over financial stressors  and health concerns due to his ESRD as he lives on his disability income along with his wifes income.       Expected Outcomes Will continue to offer support as needed. Current stressors will be controlled upon completion of phase 2 cardiac rehab.       Interventions Encouraged to attend Cardiac Rehabilitation for the exercise;Stress management education       Continue Psychosocial Services  Follow up required by counselor               Initial Review   Source of Stress Concerns Chronic Illness;Retirement/disability                Psychosocial Discharge (Final Psychosocial Re-Evaluation):  Psychosocial Re-Evaluation - 12/21/20 1520       Psychosocial Re-Evaluation   Current issues with Current Stress Concerns    Comments Abundio continues to voice concerns over financial stressors  and health concerns due to his ESRD as he lives on his disability income along with his wifes income.    Expected Outcomes Will continue to offer support as needed. Current stressors will be controlled upon completion of phase 2 cardiac rehab.    Interventions Encouraged to attend Cardiac Rehabilitation for the exercise;Stress management education    Continue Psychosocial Services  Follow up required by counselor      Initial Review   Source of Stress Concerns Chronic Illness;Retirement/disability             Vocational Rehabilitation: Provide vocational rehab assistance to qualifying candidates.   Vocational Rehab Evaluation & Intervention:  Vocational Rehab - 12/01/20 1021       Initial Vocational Rehab Evaluation & Intervention    Assessment shows need for Vocational Rehabilitation No   Robby is on disabity and does not need vocational rehab at this time.            Education: Education Goals: Education classes will be provided on a weekly basis, covering required topics. Participant will state understanding/return demonstration of topics presented.  Learning Barriers/Preferences:  Learning Barriers/Preferences - 11/30/20 1603       Learning Barriers/Preferences   Learning Barriers None    Learning Preferences Pictoral;Skilled Demonstration;Individual Instruction;Group Instruction;Video             Education Topics: Hypertension, Hypertension Reduction -Define heart disease and high blood pressure. Discus how high blood pressure affects the body and ways to reduce high blood pressure.   Exercise and Your Heart -Discuss why it is important to exercise, the FITT principles of exercise, normal and abnormal responses to exercise, and how to exercise safely.   Angina -Discuss definition of angina, causes of angina, treatment of angina, and how to decrease risk of having angina.   Cardiac Medications -Review what the following cardiac medications are used for, how they affect the body, and side effects that may occur when taking the medications.  Medications include Aspirin, Beta blockers, calcium channel blockers, ACE Inhibitors, angiotensin receptor blockers, diuretics, digoxin, and antihyperlipidemics.   Congestive Heart Failure -Discuss the definition of CHF, how to live with CHF, the signs and symptoms of CHF, and how keep track of weight and sodium intake.   Heart Disease and Intimacy -Discus the effect sexual activity has on the heart, how changes occur during intimacy as we age, and safety during sexual activity.   Smoking Cessation /  COPD -Discuss different methods to quit smoking, the health benefits of quitting smoking, and the definition of COPD.   Nutrition I: Fats -Discuss the  types of cholesterol, what cholesterol does to the heart, and how cholesterol levels can be controlled.   Nutrition II: Labels -Discuss the different components of food labels and how to read food label   Heart Parts/Heart Disease and PAD -Discuss the anatomy of the heart, the pathway of blood circulation through the heart, and these are affected by heart disease.   Stress I: Signs and Symptoms -Discuss the causes of stress, how stress may lead to anxiety and depression, and ways to limit stress.   Stress II: Relaxation -Discuss different types of relaxation techniques to limit stress.   Warning Signs of Stroke / TIA -Discuss definition of a stroke, what the signs and symptoms are of a stroke, and how to identify when someone is having stroke.   Knowledge Questionnaire Score:  Knowledge Questionnaire Score - 11/30/20 1602       Knowledge Questionnaire Score   Pre Score 20/24             Core Components/Risk Factors/Patient Goals at Admission:  Personal Goals and Risk Factors at Admission - 11/30/20 1604       Core Components/Risk Factors/Patient Goals on Admission    Weight Management Yes;Obesity;Weight Loss    Intervention Weight Management: Develop a combined nutrition and exercise program designed to reach desired caloric intake, while maintaining appropriate intake of nutrient and fiber, sodium and fats, and appropriate energy expenditure required for the weight goal.;Weight Management: Provide education and appropriate resources to help participant work on and attain dietary goals.;Weight Management/Obesity: Establish reasonable short term and long term weight goals.;Obesity: Provide education and appropriate resources to help participant work on and attain dietary goals.    Admit Weight 236 lb 5.3 oz (107.2 kg)    Expected Outcomes Short Term: Continue to assess and modify interventions until short term weight is achieved;Long Term: Adherence to nutrition and  physical activity/exercise program aimed toward attainment of established weight goal;Weight Maintenance: Understanding of the daily nutrition guidelines, which includes 25-35% calories from fat, 7% or less cal from saturated fats, less than 239m cholesterol, less than 1.5gm of sodium, & 5 or more servings of fruits and vegetables daily;Weight Loss: Understanding of general recommendations for a balanced deficit meal plan, which promotes 1-2 lb weight loss per week and includes a negative energy balance of [phone] kcal/d;Understanding recommendations for meals to include 15-35% energy as protein, 25-35% energy from fat, 35-60% energy from carbohydrates, less than 2025mof dietary cholesterol, 20-35 gm of total fiber daily;Understanding of distribution of calorie intake throughout the day with the consumption of 4-5 meals/snacks    Diabetes Yes    Intervention Provide education about signs/symptoms and action to take for hypo/hyperglycemia.;Provide education about proper nutrition, including hydration, and aerobic/resistive exercise prescription along with prescribed medications to achieve blood glucose in normal ranges: Fasting glucose 65-99 mg/dL    Expected Outcomes Short Term: Participant verbalizes understanding of the signs/symptoms and immediate care of hyper/hypoglycemia, proper foot care and importance of medication, aerobic/resistive exercise and nutrition plan for blood glucose control.;Long Term: Attainment of HbA1C < 7%.    Heart Failure Yes    Intervention Provide a combined exercise and nutrition program that is supplemented with education, support and counseling about heart failure. Directed toward relieving symptoms such as shortness of breath, decreased exercise tolerance, and extremity edema.    Expected Outcomes Short term:  Attendance in program 2-3 days a week with increased exercise capacity. Reported lower sodium intake. Reported increased fruit and vegetable intake. Reports medication  compliance.;Improve functional capacity of life;Short term: Daily weights obtained and reported for increase. Utilizing diuretic protocols set by physician.;Long term: Adoption of self-care skills and reduction of barriers for early signs and symptoms recognition and intervention leading to self-care maintenance.    Hypertension Yes    Intervention Provide education on lifestyle modifcations including regular physical activity/exercise, weight management, moderate sodium restriction and increased consumption of fresh fruit, vegetables, and low fat dairy, alcohol moderation, and smoking cessation.;Monitor prescription use compliance.    Expected Outcomes Short Term: Continued assessment and intervention until BP is < 140/68m HG in hypertensive participants. < 130/823mHG in hypertensive participants with diabetes, heart failure or chronic kidney disease.;Long Term: Maintenance of blood pressure at goal levels.    Lipids Yes    Intervention Provide education and support for participant on nutrition & aerobic/resistive exercise along with prescribed medications to achieve LDL <708mHDL >54m2m  Expected Outcomes Short Term: Participant states understanding of desired cholesterol values and is compliant with medications prescribed. Participant is following exercise prescription and nutrition guidelines.;Long Term: Cholesterol controlled with medications as prescribed, with individualized exercise RX and with personalized nutrition plan. Value goals: LDL < 70mg75mL > 40 mg.    Stress Yes    Intervention Offer individual and/or small group education and counseling on adjustment to heart disease, stress management and health-related lifestyle change. Teach and support self-help strategies.;Refer participants experiencing significant psychosocial distress to appropriate mental health specialists for further evaluation and treatment. When possible, include family members and significant others in  education/counseling sessions.    Expected Outcomes Short Term: Participant demonstrates changes in health-related behavior, relaxation and other stress management skills, ability to obtain effective social support, and compliance with psychotropic medications if prescribed.;Long Term: Emotional wellbeing is indicated by absence of clinically significant psychosocial distress or social isolation.             Core Components/Risk Factors/Patient Goals Review:   Goals and Risk Factor Review     Row Name 12/21/20 1526             Core Components/Risk Factors/Patient Goals Review   Personal Goals Review Weight Management/Obesity;Stress;Hypertension;Lipids;Diabetes       Review KennaDontraebeen doing well with exercise. Dewain's systlic Bp's have had some moderate elevations. Hamlet's weight was up on 12/20/20. Patient was given a copy of his exercise flow sheets to [facility] continue to participate in phase 2 cardiac rehab for exercise, nutrition and lifestyle modifications.                Core Components/Risk Factors/Patient Goals at Discharge (Final Review):   Goals and Risk Factor Review - 12/21/20 1526       Core Components/Risk Factors/Patient Goals Review   Personal Goals Review Weight Management/Obesity;Stress;Hypertension;Lipids;Diabetes    Review KennaKemontebeen doing well with exercise. Julie's systlic Bp's have had some moderate elevations. Wilford's weight was up on 12/20/20. Patient was given a copy of his exercise flow sheets to [facility] continue to participate in phase 2 cardiac rehab for exercise, nutrition and lifestyle modifications.             ITP Comments:  ITP Comments  [place] Name 12/01/20 [phone] 12/21/20 1517         ITP Comments Dr Fransico Him MD, Medical Director 30 Day ITP Review. Robinson has good attendance and  participation in phase 2 cardiac rehab. Legrande is off to a good start to exercise.               Comments: See ITP comments.Harrell Gave RN BSN

## 2020-12-22 ENCOUNTER — Other Ambulatory Visit: Payer: Self-pay

## 2020-12-22 ENCOUNTER — Encounter (HOSPITAL_COMMUNITY)
Admission: RE | Admit: 2020-12-22 | Discharge: 2020-12-22 | Disposition: A | Discharge status: Home / Self Care | Payer: Medicare Other | Source: Ambulatory Visit | Attending: Cardiology | Admitting: Cardiology

## 2020-12-22 DIAGNOSIS — I214 Non-ST elevation (NSTEMI) myocardial infarction: Secondary | ICD-10-CM

## 2020-12-22 DIAGNOSIS — N186 End stage renal disease: Secondary | ICD-10-CM | POA: Diagnosis not present

## 2020-12-22 DIAGNOSIS — Z992 Dependence on renal dialysis: Secondary | ICD-10-CM | POA: Diagnosis not present

## 2020-12-22 NOTE — Progress Notes (Signed)
Incomplete Session Note  Patient Details  Name: Donald [name] MRN: [id number] Date of Birth: [birth year] Referring Provider:   Flowsheet Row CARDIAC REHAB PHASE II ORIENTATION from 11/30/2020 in [facility] [place]  Referring Provider Oswaldo Milian, MD       Donald [name] did not complete his rehab session. Donald [name] reported having 8/10 lower back pain. Weight 105.9 kg. Advised Donald [name] not to exercise today. Donald [name] has a follow up appointment with his primary physician on Friday. Donald [name] hopes to return to exercise on Monday provided that he is feeling better.Donald Pall, RN,BSN 12/22/2020 1:32 PM

## 2020-12-23 DIAGNOSIS — N186 End stage renal disease: Secondary | ICD-10-CM | POA: Diagnosis not present

## 2020-12-23 DIAGNOSIS — Z992 Dependence on renal dialysis: Secondary | ICD-10-CM | POA: Diagnosis not present

## 2020-12-24 ENCOUNTER — Encounter: Payer: Self-pay | Admitting: Internal Medicine

## 2020-12-24 ENCOUNTER — Ambulatory Visit: Payer: Medicare Other | Attending: Internal Medicine | Admitting: Internal Medicine

## 2020-12-24 ENCOUNTER — Other Ambulatory Visit: Payer: Self-pay

## 2020-12-24 VITALS — BP 160/70 | HR 71 | Resp 16 | Wt 232.6 lb

## 2020-12-24 DIAGNOSIS — I25718 Atherosclerosis of autologous vein coronary artery bypass graft(s) with other forms of angina pectoris: Secondary | ICD-10-CM | POA: Diagnosis not present

## 2020-12-24 DIAGNOSIS — I129 Hypertensive chronic kidney disease with stage 1 through stage 4 chronic kidney disease, or unspecified chronic kidney disease: Secondary | ICD-10-CM

## 2020-12-24 DIAGNOSIS — M545 Low back pain, unspecified: Secondary | ICD-10-CM | POA: Diagnosis not present

## 2020-12-24 DIAGNOSIS — E1142 Type 2 diabetes mellitus with diabetic polyneuropathy: Secondary | ICD-10-CM | POA: Diagnosis not present

## 2020-12-24 DIAGNOSIS — Z992 Dependence on renal dialysis: Secondary | ICD-10-CM | POA: Diagnosis not present

## 2020-12-24 DIAGNOSIS — Z23 Encounter for immunization: Secondary | ICD-10-CM | POA: Diagnosis not present

## 2020-12-24 DIAGNOSIS — N186 End stage renal disease: Secondary | ICD-10-CM | POA: Diagnosis not present

## 2020-12-24 LAB — GLUCOSE, POCT (MANUAL RESULT ENTRY): POC Glucose: 123 mg/dl — AB (ref 70–99)

## 2020-12-24 MED ORDER — ACETAMINOPHEN-CODEINE #3 300-30 MG PO TABS
2.0000 | ORAL_TABLET | Freq: Three times a day (TID) | ORAL | 0 refills | Status: DC | PRN
Start: 2020-12-24 — End: 2021-07-16

## 2020-12-24 NOTE — Progress Notes (Signed)
Patient ID: XAVY RAMBIN, male    DOB: Dec 31, 1970  MRN: [id number]  CC: Diabetes and Follow-up   Subjective: Donald [name] is a 50 y.o. 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   C/o pain in midline and RT lower back x 4 days.  He points to the lower thoracic to upper lumbar region. No initiating factors. Constant and aching.  Also he describes it as sharp pain with certain movements like when getting out of bed No fever Pt does not make urine at all.  No incontinence of bowel.  No radiation down leg.  No numbness in legs.   Worse with bending over, getting up and standing for long time. High dose ASA helps to dull pain to allow him to get a little sleep Goes to cardio rehab M/W/F.  Was unable to participate fully with his rehab session 2 days ago because of this.  Did not go to rehab today.  DM: checks BS BID.  Gives range 140-150 after meals, 115-127 before meals Reports last A1C was 6.5 a few mths ago done by his kidney specialist On Lantus 20 units Reports good eating habits  ESRD:  still on peritoneal dialysis.  On hold in terms of the transplant list because his heart function is too low. CAD/HTN: Blood pressure elevated.  He has not taken his blood pressure medicines as yet for the morning.  Some SOB and CP with prolong walking or heavy lifting.  Has form with him today to request handicap sticker.   Patient Active Problem List   Diagnosis Date Noted   Anemia due to chronic kidney disease, on chronic dialysis ([place]) [id number]   Chronic systolic HF (heart failure) ([place]) 06/22/2020   Acute coronary syndrome with high troponin ([place]) 06/01/2020   Normocytic anemia 06/01/2020   Prolonged QT interval [id number]   Complication of  vascular access for dialysis 05/31/2020   Age-related nuclear cataract of right eye 05/26/2020   Pseudophakia, left eye 05/26/2020   Right epiretinal membrane 05/26/2020   Controlled type 2 diabetes mellitus with stable proliferative retinopathy of both eyes, with long-term current use of insulin ([place]) 05/26/2020   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 ([place]) 05/20/2020   Primary insomnia 05/20/2020   Hyperlipidemia associated with type 2 diabetes mellitus ([place]) 05/20/2020   History of amputation of left great toe ([place]) 05/20/2020   Elevated troponin    GI bleed 01/01/2020   Hematemesis 12/31/2019   Type 2 diabetes mellitus with peripheral neuropathy ([place]) 12/31/2019   Essential hypertension    ESRD on peritoneal dialysis ([place])    HLD (hyperlipidemia)    Hypertensive urgency 07/14/2019   Chest pain      Current Outpatient Medications on File Prior to Visit  Medication Sig Dispense Refill   aspirin EC 81 MG EC tablet Take 1 tablet (81 mg total) by mouth daily. 30 tablet 3   calcium acetate (PHOSLO) 667 MG capsule Take 667-2,001 mg by mouth See admin instructions. Take 3 capsules by mouth with each meal & take 1 capsule with snacks     carvedilol (COREG) 12.5 MG tablet Take 1 tablet (12.5 mg total) by mouth 2 (two) times daily with a meal. 180 tablet 3  cinacalcet (SENSIPAR) 30 MG tablet Take by mouth daily.     clopidogrel (PLAVIX) 75 MG tablet Take 1 tablet (75 mg total) by mouth daily. 30 tablet 3   Continuous Blood Gluc Sensor (FREESTYLE LIBRE SENSOR SYSTEM) MISC Use to check blood sugar at least 4 times daily. Change sensor Q 2 wks 2 each 12   gentamicin cream (GARAMYCIN) 0.1 % Apply 1 application topically every morning. Apply to exit site every day     insulin aspart (NOVOLOG) 100 UNIT/ML FlexPen Inject 6 Units into the skin 3 (three) times daily with meals. 15 mL 11   insulin glargine (LANTUS SOLOSTAR) 100 UNIT/ML Solostar Pen Inject  20 Units into the skin daily. (Patient taking differently: Inject 20 Units into the skin every evening.) 15 mL 11   Insulin Pen Needle (TRUEPLUS PEN NEEDLES) 31G X 6 MM MISC Use to inject Novolog TID and Lantus once daily. Total injections in 1 day = 4. 100 each 6   Peritoneal Dialysis Solutions (DIALYSIS SOLUTION 2.5% LOW-MG/LOW-CA) 394 MOSM/L SOLN dianeal solution As per nephro  0   rosuvastatin (CRESTOR) 40 MG tablet Take 40 mg by mouth in the morning.     senna (SENOKOT) 8.6 MG tablet Take 2 tablets (17.2 mg total) by mouth daily. (Patient taking differently: Take 2 tablets by mouth daily as needed for constipation.) 30 tablet 3   isosorbide dinitrate (ISORDIL) 20 MG tablet Take by mouth.     No current facility-administered medications on file prior to visit.    Allergies  Allergen Reactions   Other Swelling    Other reaction(s): EGGPLANT Facial swelling   Lisinopril Hives    Social History   Socioeconomic History   Marital status: Married    Spouse name: Not on file   Number of children: 6   Years of education: Not on file   Highest education level: Some college, no degree  Occupational History   Not on file  Tobacco Use   Smoking status: Never   Smokeless tobacco: Never  Vaping Use   Vaping Use: Never used  Substance and Sexual Activity   Alcohol use: Yes    Comment: occasionally   Drug use: Yes    Types: Marijuana    Comment: occasionally   Sexual activity: Not on file  Other Topics Concern   Not on file  Social History Narrative   Not on file   Social Determinants of Health   Financial Resource Strain: Low Risk    Difficulty of Paying Living Expenses: Not very hard  Food Insecurity: No Food Insecurity   Worried About Running Out of Food in the Last Year: Never true   Ran Out of Food in the Last Year: Never true  Transportation Needs: No Transportation Needs   Lack of Transportation (Medical): No   Lack of Transportation (Non-Medical): No  Physical  Activity: Insufficiently Active   Days of Exercise per Week: 3 days   Minutes of Exercise per Session: 30 min  Stress: No Stress Concern Present   Feeling of Stress : Only a little  Social Connections: Unknown   Frequency of Communication with Friends and Family: Three times a week   Frequency of Social Gatherings with Friends and Family: Three times a week   Attends Religious Services: Not on file   Active Member of Clubs or Organizations: Not on file   Attends Club or Organization Meetings: Not on file   Marital Status: Not on file  Intimate Partner Violence: Not At  Risk   Fear of Current or Ex-Partner: No   Emotionally Abused: No   Physically Abused: No   Sexually Abused: No    Family History  Problem Relation Age of Onset   Diabetes Mellitus II Mother    Heart disease Mother    Diabetes Mother    Hypertension Mother    Kidney failure Mother    Diabetes Maternal Grandmother    Hyperlipidemia Maternal Grandmother    Dementia Maternal Grandmother     Past Surgical History:  Procedure Laterality Date   AV FISTULA PLACEMENT     CARDIAC SURGERY     COLONOSCOPY WITH PROPOFOL N/A 02/11/2020   Procedure: COLONOSCOPY WITH PROPOFOL;  Surgeon: Toledo, Benay Pike, MD;  Location: [facility] ENDOSCOPY;  Service: Gastroenterology;  Laterality: N/A;   CORONARY ARTERY BYPASS GRAFT  2016   ESOPHAGOGASTRODUODENOSCOPY (EGD) WITH PROPOFOL N/A 01/02/2020   Procedure: ESOPHAGOGASTRODUODENOSCOPY (EGD) WITH PROPOFOL;  Surgeon: Lesly Rubenstein, MD;  Location: [facility] ENDOSCOPY;  Service: Endoscopy;  Laterality: N/A;   INSERTION OF DIALYSIS CATHETER     left great toe amputation     LEFT HEART CATH AND CORS/GRAFTS ANGIOGRAPHY N/A 06/03/2020   Procedure: LEFT HEART CATH AND CORS/GRAFTS ANGIOGRAPHY;  Surgeon: Sherren Mocha, MD;  Location: [facility];  Service: Cardiovascular;  Laterality: N/A;   VITRECTOMY Left 2014    ROS: Review of Systems Negative except as stated above  PHYSICAL  EXAM: BP (!) 160/70   Pulse 71   Resp 16   Wt 232 lb 9.6 oz (105.5 kg)   SpO2 94%   BMI 33.37 kg/m   Physical Exam   General appearance - alert, well appearing, and in no distress Mental status - normal mood, behavior, speech, dress, motor activity, and thought processes Chest - clear to auscultation, no wheezes, rales or rhonchi, symmetric air entry Heart -regular rate and rhythm. Musculoskeletal -mild to moderate tenderness on palpation of the lower thoracic and upper lumbar spine.  Straight leg raise negative.  Power in both lower extremities 5/5 bilaterally. Extremities -no lower extremity edema.  CMP Latest Ref Rng & Units 08/23/2020 07/29/2020 06/03/2020  Glucose 65 - 99 mg/dL 110(H) 110(H) 163(H)  BUN 6 - 24 mg/dL 73(H) 72(H) 82(H)  Creatinine 0.76 - 1.27 mg/dL 17.51(HH) 17.50(H) 18.75(H)  Sodium 134 - 144 mmol/L 136 131(L) 132(L)  Potassium 3.5 - 5.2 mmol/L 5.2 4.0 3.6  Chloride 96 - 106 mmol/L 90(L) 91(L) 93(L)  CO2 20 - 29 mmol/L '22 25 23  '$ Calcium 8.7 - 10.2 mg/dL 9.3 10.3 8.4(L)  Total Protein 6.5 - 8.1 g/dL - - -  Total Bilirubin 0.3 - 1.2 mg/dL - - -  Alkaline Phos 38 - 126 U/L - - -  AST 15 - 41 U/L - - -  ALT 0 - 44 U/L - - -   Lipid Panel     Component Value Date/Time   CHOL 152 09/09/2020 1137   TRIG 154 (H) 09/09/2020 1137   HDL 43 09/09/2020 1137   CHOLHDL 3.5 09/09/2020 1137   CHOLHDL 3.6 07/14/2019 2209   VLDL 25 07/14/2019 2209   LDLCALC 82 09/09/2020 1137    CBC    Component Value Date/Time   WBC 6.2 07/29/2020 0105   RBC 3.19 (L) 07/29/2020 0105   HGB 10.0 (L) 07/29/2020 0105   HCT 29.6 (L) 07/29/2020 0105   PLT 226 07/29/2020 0105   MCV 92.8 07/29/2020 0105   MCH 31.3 07/29/2020 0105   MCHC 33.8 07/29/2020 0105  RDW 15.9 (H) 07/29/2020 0105   LYMPHSABS 1.4 01/04/2020 0730   MONOABS 0.6 01/04/2020 0730   EOSABS 0.5 01/04/2020 0730   BASOSABS 0.0 01/04/2020 0730   BS 123. ASSESSMENT AND PLAN:  1. Acute right-sided low back pain  without sciatica Of questionable etiology but nonradicular. Advised patient to use a heating pad several times a day as needed.  I will give some Tylenol with codeine for him to use as needed for several days.  If the pain does not improve or worsens, he should return or be seen in the emergency room for [place] controlled substance reporting system reviewed.  Patient advised that the Tylenol with codeine can cause some drowsiness. - acetaminophen-codeine (TYLENOL #3) 300-30 MG tablet; Take 2 tablets by mouth every 8 (eight) hours as needed for moderate pain.  Dispense: 50 tablet; Refill: 0  2. Type 2 diabetes mellitus with peripheral neuropathy (HCC) Reported blood sugar readings are at goal. - POCT glucose (manual entry)  3. Hypertensive renal disease Not at goal but improved on repeat blood pressure check today.  He will take his blood pressure medications when he returns home  4. 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 Need for immunization against influenza - Flu Vaccine QUAD 102moIM (Fluarix, Fluzone & Alfiuria Quad PF)   Patient was given the opportunity to ask questions.  Patient verbalized understanding of the plan and was able to repeat key elements of the plan.   Orders Placed This Encounter  Procedures   Flu Vaccine QUAD 614moM (Fluarix, Fluzone & Alfiuria Quad PF)   POCT glucose (manual entry)     Requested Prescriptions   Signed Prescriptions Disp Refills   acetaminophen-codeine (TYLENOL #3) 300-30 MG tablet 50 tablet 0    Sig: Take 2 tablets by mouth every 8 (eight) hours as needed for moderate pain.    No follow-ups on file.  DeKarle PlumberMD, FACP

## 2020-12-25 DIAGNOSIS — Z992 Dependence on renal dialysis: Secondary | ICD-10-CM | POA: Diagnosis not present

## 2020-12-25 DIAGNOSIS — N186 End stage renal disease: Secondary | ICD-10-CM | POA: Diagnosis not present

## 2020-12-26 DIAGNOSIS — Z992 Dependence on renal dialysis: Secondary | ICD-10-CM | POA: Diagnosis not present

## 2020-12-26 DIAGNOSIS — N186 End stage renal disease: Secondary | ICD-10-CM | POA: Diagnosis not present

## 2020-12-27 ENCOUNTER — Encounter (HOSPITAL_COMMUNITY)
Admission: RE | Admit: 2020-12-27 | Discharge: 2020-12-27 | Disposition: A | Discharge status: Home / Self Care | Payer: Medicare Other | Source: Ambulatory Visit | Attending: Cardiology | Admitting: Cardiology

## 2020-12-27 ENCOUNTER — Other Ambulatory Visit: Payer: Self-pay

## 2020-12-27 DIAGNOSIS — Z992 Dependence on renal dialysis: Secondary | ICD-10-CM | POA: Diagnosis not present

## 2020-12-27 DIAGNOSIS — I214 Non-ST elevation (NSTEMI) myocardial infarction: Secondary | ICD-10-CM

## 2020-12-27 DIAGNOSIS — N186 End stage renal disease: Secondary | ICD-10-CM | POA: Diagnosis not present

## 2020-12-28 DIAGNOSIS — N186 End stage renal disease: Secondary | ICD-10-CM | POA: Diagnosis not present

## 2020-12-28 DIAGNOSIS — Z992 Dependence on renal dialysis: Secondary | ICD-10-CM | POA: Diagnosis not present

## 2020-12-29 ENCOUNTER — Encounter (HOSPITAL_COMMUNITY)
Admission: RE | Admit: 2020-12-29 | Discharge: 2020-12-29 | Disposition: A | Discharge status: Home / Self Care | Payer: Medicare Other | Source: Ambulatory Visit | Attending: Cardiology | Admitting: Cardiology

## 2020-12-29 ENCOUNTER — Other Ambulatory Visit: Payer: Self-pay

## 2020-12-29 DIAGNOSIS — I214 Non-ST elevation (NSTEMI) myocardial infarction: Secondary | ICD-10-CM

## 2020-12-29 DIAGNOSIS — Z992 Dependence on renal dialysis: Secondary | ICD-10-CM | POA: Diagnosis not present

## 2020-12-29 DIAGNOSIS — N186 End stage renal disease: Secondary | ICD-10-CM | POA: Diagnosis not present

## 2020-12-30 DIAGNOSIS — Z992 Dependence on renal dialysis: Secondary | ICD-10-CM | POA: Diagnosis not present

## 2020-12-30 DIAGNOSIS — N186 End stage renal disease: Secondary | ICD-10-CM | POA: Diagnosis not present

## 2020-12-31 ENCOUNTER — Other Ambulatory Visit: Payer: Self-pay

## 2020-12-31 ENCOUNTER — Encounter (HOSPITAL_COMMUNITY)
Admission: RE | Admit: 2020-12-31 | Discharge: 2020-12-31 | Disposition: A | Discharge status: Home / Self Care | Payer: Medicare Other | Source: Ambulatory Visit | Attending: Cardiology | Admitting: Cardiology

## 2020-12-31 ENCOUNTER — Other Ambulatory Visit: Payer: Self-pay | Admitting: Medical

## 2020-12-31 DIAGNOSIS — I10 Essential (primary) hypertension: Secondary | ICD-10-CM

## 2020-12-31 DIAGNOSIS — I214 Non-ST elevation (NSTEMI) myocardial infarction: Secondary | ICD-10-CM

## 2020-12-31 DIAGNOSIS — N186 End stage renal disease: Secondary | ICD-10-CM | POA: Diagnosis not present

## 2020-12-31 DIAGNOSIS — Z992 Dependence on renal dialysis: Secondary | ICD-10-CM | POA: Diagnosis not present

## 2020-12-31 NOTE — Progress Notes (Signed)
Patient noted to have increased frequent PVC's today multifocal PVC's, couplets and triplets. Jud is mostly asymptomatic but did report feeling a little more short of breath on level 4.  Exercise stopped due to increased ectopy.  Entry blood pressure 122/76 heart rate 77. CBG 138. Roby Lofts [facility]  paged and notified about today's increased ectopy. Daleen Snook spoke with [place] over the phone and will order lab work for today. Daleen Snook gave Junius Argyle an appointment to follow up with Dr Gardiner Rhyme on Wed at 2:30 pm. Will have Lawson hold off on exercising until he is seen by Dr Gardiner Rhyme next week. [place] is agreeable to the plan. Andon left  cardiac rehab without complaints or symptoms. Will fax exercise flow sheets to Dr. Newman Nickels office for review with today's ECG tracings.Barnet Pall, RN,BSN 12/31/2020 4:21 PM

## 2021-01-01 DIAGNOSIS — Z992 Dependence on renal dialysis: Secondary | ICD-10-CM | POA: Diagnosis not present

## 2021-01-01 DIAGNOSIS — N186 End stage renal disease: Secondary | ICD-10-CM | POA: Diagnosis not present

## 2021-01-01 LAB — BASIC METABOLIC PANEL
BUN/Creatinine Ratio: 5 — ABNORMAL LOW (ref 9–20)
BUN: 89 mg/dL (ref 6–24)
CO2: 20 mmol/L (ref 20–29)
Calcium: 9.6 mg/dL (ref 8.7–10.2)
Chloride: 90 mmol/L — ABNORMAL LOW (ref 96–106)
Creatinine, Ser: 19.61 mg/dL (ref 0.76–1.27)
Glucose: 135 mg/dL — ABNORMAL HIGH (ref 65–99)
Potassium: 4.7 mmol/L (ref 3.5–5.2)
Sodium: 134 mmol/L (ref 134–144)
eGFR: 3 mL/min/{1.73_m2} — ABNORMAL LOW (ref >59)

## 2021-01-01 LAB — MAGNESIUM: Magnesium: 2.3 mg/dL (ref 1.6–2.3)

## 2021-01-02 DIAGNOSIS — N186 End stage renal disease: Secondary | ICD-10-CM | POA: Diagnosis not present

## 2021-01-02 DIAGNOSIS — Z992 Dependence on renal dialysis: Secondary | ICD-10-CM | POA: Diagnosis not present

## 2021-01-03 ENCOUNTER — Encounter (HOSPITAL_COMMUNITY): Payer: Medicare Other

## 2021-01-03 DIAGNOSIS — Z992 Dependence on renal dialysis: Secondary | ICD-10-CM | POA: Diagnosis not present

## 2021-01-03 DIAGNOSIS — N186 End stage renal disease: Secondary | ICD-10-CM | POA: Diagnosis not present

## 2021-01-03 NOTE — Progress Notes (Signed)
   Notified by Verdis Frederickson, RN with cardiac rehab that patient was experiencing frequent ectopy which was new during rehab session. Patient was asymptomatic. Rhythm strips reviewed with frequent polymorphic PVCs but no runs of NSVT. Rhythm strips faxed to the [facility] office for Dr. Newman Nickels review as well. Recommended checking electrolyte levels as possible contributor and arranged a follow-up visit with Dr. Gardiner Rhyme 01/05/21. Patient was in agreement with the plan.   Abigail Butts, PA-C 12/31/20

## 2021-01-03 NOTE — Progress Notes (Signed)
Cardiology Office Note:    Date:  01/07/2021   ID:  Donald [name], DOB [birth year], MRN [id number]  PCP:  Ladell Pier, MD  Cardiologist:  None  Electrophysiologist:  None   Referring MD: Ladell Pier, MD   Chief Complaint  Patient presents with   Congestive Heart Failure     History of Present Illness:    FILMORE [name] is a 50 y.o. male with a hx of CAD status post CABG (in 2016 in [place]: LIMA-LAD, SVG-OM1, SVG-RPDA), ESRD on peritoneal dialysis, T2DM, left toe amputation, GI bleeding, hypertension, hyperlipidemia who presents for hospital follow-up.  He was admitted to [facility] from 2/21 through 06/03/2020 after presenting with chest pain.  Troponin elevation to 219.  Echo 04/11/2020 showed EF 35 to 40% with inferior hypokinesis, grade 1 diastolic dysfunction, moderate RV dysfunction.  Cath on 06/03/2020 showed severe native three-vessel CAD, patent LIMA-LAD and SVG-OM1, patent SVG-RPDA but total occlusion of PDA and PLA branches beyond the graft insertion site.  Medical therapy was recommended.  Since last clinic visit, he reports that he is doing well.  He started cardiac rehab.  Has been noted to have frequent PVCs and short runs of NSVT at cardiac rehab.  He has been asymptomatic with this.  He denies any chest pain, lower extremity edema, or palpitations.  Does report some dyspnea with exertion.  Does report some lightheadedness, particularly when his BP is running low.  Denies any syncope.  He is taking DAPT, denies any bleeding issues.  Does not have much caffeine intake, about 2 cups of coffee per week.  Reports occasional alcohol use.     Past Medical History:  Diagnosis Date   Diabetes mellitus without complication ([place])    History of vitrectomy 2014   anterior, left   Hx of heart bypass surgery    Hypertension    Renal disorder    dialysis     Past Surgical History:  Procedure Laterality Date   AV FISTULA PLACEMENT     CARDIAC SURGERY     COLONOSCOPY  WITH PROPOFOL N/A 02/11/2020   Procedure: COLONOSCOPY WITH PROPOFOL;  Surgeon: Toledo, Benay Pike, MD;  Location: [facility] ENDOSCOPY;  Service: Gastroenterology;  Laterality: N/A;   CORONARY ARTERY BYPASS GRAFT  2016   ESOPHAGOGASTRODUODENOSCOPY (EGD) WITH PROPOFOL N/A 01/02/2020   Procedure: ESOPHAGOGASTRODUODENOSCOPY (EGD) WITH PROPOFOL;  Surgeon: Lesly Rubenstein, MD;  Location: [facility] ENDOSCOPY;  Service: Endoscopy;  Laterality: N/A;   INSERTION OF DIALYSIS CATHETER     left great toe amputation     LEFT HEART CATH AND CORS/GRAFTS ANGIOGRAPHY N/A 06/03/2020   Procedure: LEFT HEART CATH AND CORS/GRAFTS ANGIOGRAPHY;  Surgeon: Sherren Mocha, MD;  Location: [facility];  Service: Cardiovascular;  Laterality: N/A;   VITRECTOMY Left 2014    Current Medications: Current Meds  Medication Sig   acetaminophen-codeine (TYLENOL #3) 300-30 MG tablet Take 2 tablets by mouth every 8 (eight) hours as needed for moderate pain.   aspirin EC 81 MG EC tablet Take 1 tablet (81 mg total) by mouth daily.   calcium acetate (PHOSLO) 667 MG capsule Take 667-2,001 mg by mouth See admin instructions. Take 3 capsules by mouth with each meal & take 1 capsule with snacks   carvedilol (COREG) 12.5 MG tablet Take 1 tablet (12.5 mg total) by mouth 2 (two) times daily with a meal.   cinacalcet (SENSIPAR) 30 MG tablet Take by mouth daily.   clopidogrel (PLAVIX) 75 MG tablet Take 1 tablet (  75 mg total) by mouth daily.   Continuous Blood Gluc Sensor (FREESTYLE LIBRE SENSOR SYSTEM) MISC Use to check blood sugar at least 4 times daily. Change sensor Q 2 wks   gentamicin cream (GARAMYCIN) 0.1 % Apply 1 application topically every morning. Apply to exit site every day   insulin aspart (NOVOLOG) 100 UNIT/ML FlexPen Inject 6 Units into the skin 3 (three) times daily with meals.   insulin glargine (LANTUS SOLOSTAR) 100 UNIT/ML Solostar Pen Inject 20 Units into the skin daily. (Patient taking differently: Inject 20 Units into the  skin every evening.)   Insulin Pen Needle (TRUEPLUS PEN NEEDLES) 31G X 6 MM MISC Use to inject Novolog TID and Lantus once daily. Total injections in 1 day = 4.   Peritoneal Dialysis Solutions (DIALYSIS SOLUTION 2.5% LOW-MG/LOW-CA) 394 MOSM/L SOLN dianeal solution As per nephro   rosuvastatin (CRESTOR) 40 MG tablet Take 40 mg by mouth in the morning.   senna (SENOKOT) 8.6 MG tablet Take 2 tablets (17.2 mg total) by mouth daily. (Patient taking differently: Take 2 tablets by mouth daily as needed for constipation.)   [DISCONTINUED] isosorbide dinitrate (ISORDIL) 20 MG tablet Take by mouth.     Allergies:   Other and Lisinopril   Social History   Socioeconomic History   Marital status: Married    Spouse name: Not on file   Number of children: 6   Years of education: Not on file   Highest education level: Some college, no degree  Occupational History   Not on file  Tobacco Use   Smoking status: Never   Smokeless tobacco: Never  Vaping Use   Vaping Use: Never used  Substance and Sexual Activity   Alcohol use: Yes    Comment: occasionally   Drug use: Yes    Types: Marijuana    Comment: occasionally   Sexual activity: Not on file  Other Topics Concern   Not on file  Social History Narrative   Not on file   Social Determinants of Health   Financial Resource Strain: Low Risk    Difficulty of Paying Living Expenses: Not very hard  Food Insecurity: No Food Insecurity   Worried About Running Out of Food in the Last Year: Never true   Ran Out of Food in the Last Year: Never true  Transportation Needs: No Transportation Needs   Lack of Transportation (Medical): No   Lack of Transportation (Non-Medical): No  Physical Activity: Insufficiently Active   Days of Exercise per Week: 3 days   Minutes of Exercise per Session: 30 min  Stress: No Stress Concern Present   Feeling of Stress : Only a little  Social Connections: Unknown   Frequency of Communication with Friends and Family:  Three times a week   Frequency of Social Gatherings with Friends and Family: Three times a week   Attends Religious Services: Not on file   Active Member of Clubs or Organizations: Not on file   Attends Archivist Meetings: Not on file   Marital Status: Not on file     Family History: The patient's family history includes Dementia in his maternal grandmother; Diabetes in his maternal grandmother and mother; Diabetes Mellitus II in his mother; Heart disease in his mother; Hyperlipidemia in his maternal grandmother; Hypertension in his mother; Kidney failure in his mother.  ROS:   Please see the history of present illness.     All other systems reviewed and are negative.  EKGs/Labs/Other Studies Reviewed:  The following studies were reviewed today:   EKG:  01/05/21: Sinus rhythm with PVC's, rate 70, diffuse T wave inversions, QTC 468 09/09/2020- The EKG ordered demonstrates sinus rhythm, PACs, LVH with repolarization abnormalities, QTc 497, rate 85  06/08/2020- The ekg ordered demonstrates normal sinus rhythm, rate 86, QTc 495, LVH with reports patient abnormalities    Recent Labs: 04/08/2020: ALT 32 07/29/2020: Hemoglobin 10.0; Platelets 226 12/31/2020: BUN 89; Creatinine, Ser 19.61; Magnesium 2.3; Potassium 4.7; Sodium 134  Recent Lipid Panel    Component Value Date/Time   CHOL 152 09/09/2020 1137   TRIG 154 (H) 09/09/2020 1137   HDL 43 09/09/2020 1137   CHOLHDL 3.5 09/09/2020 1137   CHOLHDL 3.6 07/14/2019 2209   VLDL 25 07/14/2019 2209   LDLCALC 82 09/09/2020 1137    Physical Exam:    VS:  BP (!) 148/76   Pulse 70   Ht '5\' 10"'$  (1.778 m)   Wt 229 lb 12.8 oz (104.2 kg)   SpO2 100%   BMI 32.97 kg/m     Wt Readings from Last 3 Encounters:  01/05/21 229 lb 12.8 oz (104.2 kg)  12/24/20 232 lb 9.6 oz (105.5 kg)  11/30/20 236 lb 5.3 oz (107.2 kg)     GEN: in no acute distress HEENT: Normal NECK: No JVD; No carotid bruits CARDIAC: RRR, no murmurs, rubs,  gallops RESPIRATORY:  Clear to auscultation without rales, wheezing or rhonchi  ABDOMEN: Soft, non-tender, non-distended MUSCULOSKELETAL:  No edema; No deformity  SKIN: Warm and dry NEUROLOGIC:  Alert and oriented x 3 PSYCHIATRIC:  Normal affect   ASSESSMENT:    1. PVC's (premature ventricular contractions)   2. Chronic combined systolic and diastolic heart failure ([place])   3. Coronary artery disease involving coronary bypass graft of native heart without angina pectoris   4. End stage renal disease ([place])   5. Hyperlipidemia, unspecified hyperlipidemia type   6. Essential hypertension     PLAN:      CAD: status post CABG (in 2016 in [place]: LIMA-LAD, SVG-OM1, SVG-RPDA).  Presented to [facility] with NSTEMI 05/31/2020. Echo 04/11/2020 showed EF 35 to 40% with inferior hypokinesis, grade 1 diastolic dysfunction, moderate RV dysfunction.  Cath on 06/03/2020 showed severe native three-vessel CAD, patent LIMA-LAD and SVG-OM1, patent SVG-RPDA but total occlusion of PDA and PLA branches beyond the graft insertion site.  Medical therapy was recommended.  Currently denies any anginal symptoms.  Stress echocardiogram at [facility] 10/18/2020 showed moderate LV dysfunction, indeterminate stress test due to abnormal resting study and failure to meet target heart rate, though no evidence of ischemia at submaximal heart rate. -Continue aspirin 81 mg daily, Plavix 75 mg -Continue rosuvastatin  Chronic combined systolic and diastolic heart failure: Echo 04/11/2020 showed EF 35 to 40% with inferior hypokinesis, grade 1 diastolic dysfunction, moderate RV dysfunction.  Ischemic cardiomyopathy as above.  Stress echocardiogram at [facility] 10/18/2020 showed moderate LV dysfunction, indeterminate stress test due to abnormal resting study and failure to meet target heart rate, though no evidence of ischemia at submaximal heart rate. -Continue carvedilol 12.5 mg daily.  Previously was on hydralazine, was discontinued due to low BP.   Remains on Isordil 20 mg -Volume removal via peritoneal dialysis, appears euvolemic  PVCs: Frequent ectopy noted during cardiac rehab.  Also short runs of NSVT.  Will check Zio patch x3 days to quantify PVC burden.  Hypertension: Continue carvedilol, Isordil as above  T2DM: On insulin.  A1c 6.4%.  Hyperlipidemia: Continue rosuvastatin 40 mg daily.  LDL  82 on 09/09/2020, Zetia was added at that time.  ESRD: On peritoneal dialysis  Snoring: Check sleep study  RTC in 6 months    Medication Adjustments/Labs and Tests Ordered: Current medicines are reviewed at length with the patient today.  Concerns regarding medicines are outlined above.  Orders Placed This Encounter  Procedures   LONG TERM MONITOR (3-14 DAYS)   EKG 12-Lead    Meds ordered this encounter  Medications   isosorbide dinitrate (ISORDIL) 20 MG tablet    Sig: Take 1 tablet (20 mg total) by mouth 2 (two) times daily.    Dispense:  180 tablet    Refill:  3     Patient Instructions  Medication Instructions:  Your physician recommends that you continue on your current medications as directed. Please refer to the Current Medication list given to you today.  *If you need a refill on your cardiac medications before your next appointment, please call your pharmacy*  Testing/Procedures: [place] Monitor Instructions   Your physician has requested you wear a ZIO patch monitor for _3__ days.  This is a single patch monitor.   [employer] supplies one patch monitor per enrollment. Additional stickers are not available. Please do not apply patch if you will be having a Nuclear Stress Test, Echocardiogram, Cardiac CT, MRI, or Chest Xray during the period you would be wearing the monitor. The patch cannot be worn during these tests. You cannot remove and re-apply the ZIO XT patch monitor.  Your ZIO patch monitor will be sent Fed Ex from [employer] directly to your home address. It may take 3-5 days to receive your  monitor after you have been enrolled.  Once you have received your monitor, please review the enclosed instructions. Your monitor has already been registered assigning a specific monitor serial # to you.  Billing and Patient Assistance Program Information   We have supplied [employer] with any of your insurance information on file for billing purposes. [employer] offers a sliding scale Patient Assistance Program for patients that do not have insurance, or whose insurance does not completely cover the cost of the ZIO monitor.   You must apply for the Patient Assistance Program to qualify for this discounted rate.     To apply, please call [employer] at [phone], select option 4, then select option 2, and ask to apply for Patient Assistance Program.  Theodore Demark will ask your household income, and how many people are in your household.  They will quote your out-of-pocket cost based on that information.  [employer] will also be able to set up a 10-month interest-free payment plan if needed.  Applying the monitor   Shave hair from upper left chest.  Hold abrader disc by orange tab. Rub abrader in 40 strokes over the upper left chest as indicated in your monitor instructions.  Clean area with 4 enclosed alcohol pads. Let dry.  Apply patch as indicated in monitor instructions. Patch will be placed under collarbone on left side of chest with arrow pointing upward.  Rub patch adhesive wings for 2 minutes. Remove white label marked "1". Remove the white label marked "2". Rub patch adhesive wings for 2 additional minutes.  While looking in a mirror, press and release button in center of patch. A small green light will flash 3-4 times. This will be your only indicator that the monitor has been turned on. ?  Do not shower for the first 24 hours. You may shower after the first 24 hours.  Press the button if you feel a symptom. You will hear a small click. Record Date, Time and Symptom in the Patient Logbook.  When you are  ready to remove the patch, follow instructions on the last 2 pages of the Patient Logbook. Stick patch monitor onto the last page of Patient Logbook.  Place Patient Logbook in the blue and white box.  Use locking tab on box and tape box closed securely.  The blue and white box has prepaid postage on it. Please place it in the mailbox as soon as possible. Your physician should have your test results approximately 7 days after the monitor has been mailed back to [facility].  Call [place] at [phone] if you have questions regarding your ZIO XT patch monitor. Call them immediately if you see an orange light blinking on your monitor.  If your monitor falls off in less than 4 days, contact our Monitor department at [phone]. ?If your monitor becomes loose or falls off after 4 days call [employer] at [phone] for suggestions on securing your monitor.?  Follow-Up: At [facility], you and your health needs are our priority.  As part of our continuing mission to provide you with exceptional heart care, we have created designated Provider Care Teams.  These Care Teams include your primary Cardiologist (physician) and Advanced Practice Providers (APPs -  Physician Assistants and Nurse Practitioners) who all work together to provide you with the care you need, when you need it.  We recommend signing up for the patient portal called "MyChart".  Sign up information is provided on this After Visit Summary.  MyChart is used to connect with patients for Virtual Visits (Telemedicine).  Patients are able to view lab/test results, encounter notes, upcoming appointments, etc.  Non-urgent messages can be sent to your provider as well.   To learn more about what you can do with MyChart, go to [email or web address].    Your next appointment:   6 month(s)  The format for your next appointment:   In Person  Provider:   Oswaldo Milian, MD      Signed, Donato Heinz, MD  01/07/2021 8:43 AM    [place]

## 2021-01-04 DIAGNOSIS — Z992 Dependence on renal dialysis: Secondary | ICD-10-CM | POA: Diagnosis not present

## 2021-01-04 DIAGNOSIS — N186 End stage renal disease: Secondary | ICD-10-CM | POA: Diagnosis not present

## 2021-01-05 ENCOUNTER — Ambulatory Visit (INDEPENDENT_AMBULATORY_CARE_PROVIDER_SITE_OTHER): Payer: Medicare Other | Admitting: Cardiology

## 2021-01-05 ENCOUNTER — Other Ambulatory Visit: Payer: Self-pay

## 2021-01-05 ENCOUNTER — Ambulatory Visit: Payer: Medicare Other

## 2021-01-05 ENCOUNTER — Encounter (HOSPITAL_COMMUNITY): Payer: Medicare Other

## 2021-01-05 ENCOUNTER — Encounter: Payer: Self-pay | Admitting: Cardiology

## 2021-01-05 VITALS — BP 148/76 | HR 70 | Ht 70.0 in | Wt 229.8 lb

## 2021-01-05 DIAGNOSIS — I493 Ventricular premature depolarization: Secondary | ICD-10-CM

## 2021-01-05 DIAGNOSIS — Z992 Dependence on renal dialysis: Secondary | ICD-10-CM | POA: Diagnosis not present

## 2021-01-05 DIAGNOSIS — I5042 Chronic combined systolic (congestive) and diastolic (congestive) heart failure: Secondary | ICD-10-CM | POA: Diagnosis not present

## 2021-01-05 DIAGNOSIS — N186 End stage renal disease: Secondary | ICD-10-CM | POA: Diagnosis not present

## 2021-01-05 DIAGNOSIS — I25810 Atherosclerosis of coronary artery bypass graft(s) without angina pectoris: Secondary | ICD-10-CM

## 2021-01-05 DIAGNOSIS — E785 Hyperlipidemia, unspecified: Secondary | ICD-10-CM | POA: Diagnosis not present

## 2021-01-05 DIAGNOSIS — I10 Essential (primary) hypertension: Secondary | ICD-10-CM | POA: Diagnosis not present

## 2021-01-05 MED ORDER — ISOSORBIDE DINITRATE 20 MG PO TABS: 20.0000 mg | ORAL_TABLET | Freq: Two times a day (BID) | ORAL | 3 refills | Status: DC

## 2021-01-05 NOTE — Patient Instructions (Signed)
Medication Instructions:  Your physician recommends that you continue on your current medications as directed. Please refer to the Current Medication list given to you today.  *If you need a refill on your cardiac medications before your next appointment, please call your pharmacy*  Testing/Procedures: [place] Monitor Instructions   Your physician has requested you wear a ZIO patch monitor for _3__ days.  This is a single patch monitor.   [employer] supplies one patch monitor per enrollment. Additional stickers are not available. Please do not apply patch if you will be having a Nuclear Stress Test, Echocardiogram, Cardiac CT, MRI, or Chest Xray during the period you would be wearing the monitor. The patch cannot be worn during these tests. You cannot remove and re-apply the ZIO XT patch monitor.  Your ZIO patch monitor will be sent Fed Ex from [employer] directly to your home address. It may take 3-5 days to receive your monitor after you have been enrolled.  Once you have received your monitor, please review the enclosed instructions. Your monitor has already been registered assigning a specific monitor serial # to you.  Billing and Patient Assistance Program Information   We have supplied [employer] with any of your insurance information on file for billing purposes. [employer] offers a sliding scale Patient Assistance Program for patients that do not have insurance, or whose insurance does not completely cover the cost of the ZIO monitor.   You must apply for the Patient Assistance Program to qualify for this discounted rate.     To apply, please call [employer] at [phone], select option 4, then select option 2, and ask to apply for Patient Assistance Program.  Theodore Demark will ask your household income, and how many people are in your household.  They will quote your out-of-pocket cost based on that information.  [employer] will also be able to set up a 27-month, interest-free payment plan  if needed.  Applying the monitor   Shave hair from upper left chest.  Hold abrader disc by orange tab. Rub abrader in 40 strokes over the upper left chest as indicated in your monitor instructions.  Clean area with 4 enclosed alcohol pads. Let dry.  Apply patch as indicated in monitor instructions. Patch will be placed under collarbone on left side of chest with arrow pointing upward.  Rub patch adhesive wings for 2 minutes. Remove white label marked "1". Remove the white label marked "2". Rub patch adhesive wings for 2 additional minutes.  While looking in a mirror, press and release button in center of patch. A small green light will flash 3-4 times. This will be your only indicator that the monitor has been turned on. ?  Do not shower for the first 24 hours. You may shower after the first 24 hours.  Press the button if you feel a symptom. You will hear a small click. Record Date, Time and Symptom in the Patient Logbook.  When you are ready to remove the patch, follow instructions on the last 2 pages of the Patient Logbook. Stick patch monitor onto the last page of Patient Logbook.  Place Patient Logbook in the blue and white box.  Use locking tab on box and tape box closed securely.  The blue and white box has prepaid postage on it. Please place it in the mailbox as soon as possible. Your physician should have your test results approximately 7 days after the monitor has been mailed back to [facility].  Call [facility]  at [phone] if you have questions regarding your ZIO XT patch monitor. Call them immediately if you see an orange light blinking on your monitor.  If your monitor falls off in less than 4 days, contact our Monitor department at [phone]. ?If your monitor becomes loose or falls off after 4 days call [employer] at [phone] for suggestions on securing your monitor.?  Follow-Up: At [facility], you and your health needs are our priority.  As part of  our continuing mission to provide you with exceptional heart care, we have created designated Provider Care Teams.  These Care Teams include your primary Cardiologist (physician) and Advanced Practice Providers (APPs -  Physician Assistants and Nurse Practitioners) who all work together to provide you with the care you need, when you need it.  We recommend signing up for the patient portal called "MyChart".  Sign up information is provided on this After Visit Summary.  MyChart is used to connect with patients for Virtual Visits (Telemedicine).  Patients are able to view lab/test results, encounter notes, upcoming appointments, etc.  Non-urgent messages can be sent to your provider as well.   To learn more about what you can do with MyChart, go to [email or web address].    Your next appointment:   6 month(s)  The format for your next appointment:   In Person  Provider:   Oswaldo Milian, MD

## 2021-01-05 NOTE — Progress Notes (Unsigned)
Patient enrolled for [employer] to mail a 3 day ZIO XT monitor to his address on file.

## 2021-01-06 DIAGNOSIS — Z992 Dependence on renal dialysis: Secondary | ICD-10-CM | POA: Diagnosis not present

## 2021-01-06 DIAGNOSIS — N186 End stage renal disease: Secondary | ICD-10-CM | POA: Diagnosis not present

## 2021-01-07 ENCOUNTER — Other Ambulatory Visit: Payer: Self-pay

## 2021-01-07 ENCOUNTER — Telehealth (HOSPITAL_COMMUNITY): Payer: Self-pay | Admitting: *Deleted

## 2021-01-07 ENCOUNTER — Encounter (HOSPITAL_COMMUNITY)
Admission: RE | Admit: 2021-01-07 | Discharge: 2021-01-07 | Disposition: A | Discharge status: Home / Self Care | Payer: Medicare Other | Source: Ambulatory Visit | Attending: Cardiology | Admitting: Cardiology

## 2021-01-07 DIAGNOSIS — N186 End stage renal disease: Secondary | ICD-10-CM | POA: Diagnosis not present

## 2021-01-07 DIAGNOSIS — I214 Non-ST elevation (NSTEMI) myocardial infarction: Secondary | ICD-10-CM

## 2021-01-07 DIAGNOSIS — Z992 Dependence on renal dialysis: Secondary | ICD-10-CM | POA: Diagnosis not present

## 2021-01-07 NOTE — Telephone Encounter (Signed)
Spoke to [place] he says he plans to return to exercise this afternoon.Barnet Pall, RN,BSN 01/07/2021 8:33 AM

## 2021-01-07 NOTE — Telephone Encounter (Signed)
-----   Message from Donato Heinz, MD sent at 01/07/2021  6:32 AM EDT ----- Regarding: RE: Ok to return to Cardiac Rehab Yes he can return to cardiac rehab Thanks, Gerald Stabs ----- Message ----- From: Lesly Rubenstein Sent: 01/06/2021   3:29 PM EDT To: Magda Kiel, RN, # Subject: Ok to return to Cardiac Rehab                  Good afternoon Dr. Gardiner Rhyme,  Is Mr. Plumer was seen in you office yesterday, is he cleared to return to cardiac rehabilitation?  Thank you,  Lesly Rubenstein MS, ACSM-CEP, CCRP

## 2021-01-08 DIAGNOSIS — N186 End stage renal disease: Secondary | ICD-10-CM | POA: Diagnosis not present

## 2021-01-08 DIAGNOSIS — Z992 Dependence on renal dialysis: Secondary | ICD-10-CM | POA: Diagnosis not present

## 2021-01-09 DIAGNOSIS — Z992 Dependence on renal dialysis: Secondary | ICD-10-CM | POA: Diagnosis not present

## 2021-01-09 DIAGNOSIS — N186 End stage renal disease: Secondary | ICD-10-CM | POA: Diagnosis not present

## 2021-01-10 ENCOUNTER — Other Ambulatory Visit: Payer: Self-pay

## 2021-01-10 ENCOUNTER — Encounter (HOSPITAL_COMMUNITY)
Admission: RE | Admit: 2021-01-10 | Discharge: 2021-01-10 | Disposition: A | Discharge status: Home / Self Care | Payer: Medicare Other | Source: Ambulatory Visit | Attending: Cardiology | Admitting: Cardiology

## 2021-01-10 DIAGNOSIS — Z992 Dependence on renal dialysis: Secondary | ICD-10-CM | POA: Diagnosis not present

## 2021-01-10 DIAGNOSIS — N186 End stage renal disease: Secondary | ICD-10-CM | POA: Diagnosis not present

## 2021-01-10 DIAGNOSIS — I214 Non-ST elevation (NSTEMI) myocardial infarction: Secondary | ICD-10-CM | POA: Insufficient documentation

## 2021-01-11 DIAGNOSIS — Z992 Dependence on renal dialysis: Secondary | ICD-10-CM | POA: Diagnosis not present

## 2021-01-11 DIAGNOSIS — N186 End stage renal disease: Secondary | ICD-10-CM | POA: Diagnosis not present

## 2021-01-12 ENCOUNTER — Other Ambulatory Visit: Payer: Self-pay

## 2021-01-12 ENCOUNTER — Encounter (HOSPITAL_COMMUNITY)
Admission: RE | Admit: 2021-01-12 | Discharge: 2021-01-12 | Disposition: A | Discharge status: Home / Self Care | Payer: Medicare Other | Source: Ambulatory Visit | Attending: Cardiology | Admitting: Cardiology

## 2021-01-12 DIAGNOSIS — Z992 Dependence on renal dialysis: Secondary | ICD-10-CM | POA: Diagnosis not present

## 2021-01-12 DIAGNOSIS — I214 Non-ST elevation (NSTEMI) myocardial infarction: Secondary | ICD-10-CM

## 2021-01-12 DIAGNOSIS — N186 End stage renal disease: Secondary | ICD-10-CM | POA: Diagnosis not present

## 2021-01-13 DIAGNOSIS — N186 End stage renal disease: Secondary | ICD-10-CM | POA: Diagnosis not present

## 2021-01-13 DIAGNOSIS — Z992 Dependence on renal dialysis: Secondary | ICD-10-CM | POA: Diagnosis not present

## 2021-01-14 ENCOUNTER — Encounter (HOSPITAL_COMMUNITY): Payer: Medicare Other

## 2021-01-14 DIAGNOSIS — Z7682 Awaiting organ transplant status: Secondary | ICD-10-CM | POA: Diagnosis not present

## 2021-01-14 DIAGNOSIS — R188 Other ascites: Secondary | ICD-10-CM | POA: Diagnosis not present

## 2021-01-14 DIAGNOSIS — I252 Old myocardial infarction: Secondary | ICD-10-CM | POA: Diagnosis not present

## 2021-01-14 DIAGNOSIS — I21A1 Myocardial infarction type 2: Secondary | ICD-10-CM | POA: Diagnosis not present

## 2021-01-14 DIAGNOSIS — I5030 Unspecified diastolic (congestive) heart failure: Secondary | ICD-10-CM | POA: Diagnosis not present

## 2021-01-14 DIAGNOSIS — Z23 Encounter for immunization: Secondary | ICD-10-CM | POA: Diagnosis not present

## 2021-01-14 DIAGNOSIS — Z794 Long term (current) use of insulin: Secondary | ICD-10-CM | POA: Diagnosis not present

## 2021-01-14 DIAGNOSIS — I2510 Atherosclerotic heart disease of native coronary artery without angina pectoris: Secondary | ICD-10-CM | POA: Diagnosis not present

## 2021-01-14 DIAGNOSIS — D649 Anemia, unspecified: Secondary | ICD-10-CM | POA: Diagnosis not present

## 2021-01-14 DIAGNOSIS — Z951 Presence of aortocoronary bypass graft: Secondary | ICD-10-CM | POA: Diagnosis not present

## 2021-01-14 DIAGNOSIS — Z992 Dependence on renal dialysis: Secondary | ICD-10-CM | POA: Diagnosis not present

## 2021-01-14 DIAGNOSIS — R918 Other nonspecific abnormal finding of lung field: Secondary | ICD-10-CM | POA: Diagnosis not present

## 2021-01-14 DIAGNOSIS — Z1159 Encounter for screening for other viral diseases: Secondary | ICD-10-CM | POA: Diagnosis not present

## 2021-01-14 DIAGNOSIS — N186 End stage renal disease: Secondary | ICD-10-CM | POA: Diagnosis not present

## 2021-01-14 DIAGNOSIS — I132 Hypertensive heart and chronic kidney disease with heart failure and with stage 5 chronic kidney disease, or end stage renal disease: Secondary | ICD-10-CM | POA: Diagnosis not present

## 2021-01-14 DIAGNOSIS — E1122 Type 2 diabetes mellitus with diabetic chronic kidney disease: Secondary | ICD-10-CM | POA: Diagnosis not present

## 2021-01-14 DIAGNOSIS — E785 Hyperlipidemia, unspecified: Secondary | ICD-10-CM | POA: Diagnosis not present

## 2021-01-14 DIAGNOSIS — E1121 Type 2 diabetes mellitus with diabetic nephropathy: Secondary | ICD-10-CM | POA: Diagnosis not present

## 2021-01-14 DIAGNOSIS — Z7289 Other problems related to lifestyle: Secondary | ICD-10-CM | POA: Diagnosis not present

## 2021-01-14 DIAGNOSIS — M791 Myalgia, unspecified site: Secondary | ICD-10-CM | POA: Diagnosis not present

## 2021-01-14 DIAGNOSIS — E871 Hypo-osmolality and hyponatremia: Secondary | ICD-10-CM | POA: Diagnosis not present

## 2021-01-15 DIAGNOSIS — N186 End stage renal disease: Secondary | ICD-10-CM | POA: Diagnosis not present

## 2021-01-15 DIAGNOSIS — Z992 Dependence on renal dialysis: Secondary | ICD-10-CM | POA: Diagnosis not present

## 2021-01-16 DIAGNOSIS — N186 End stage renal disease: Secondary | ICD-10-CM | POA: Diagnosis not present

## 2021-01-16 DIAGNOSIS — Z992 Dependence on renal dialysis: Secondary | ICD-10-CM | POA: Diagnosis not present

## 2021-01-17 ENCOUNTER — Other Ambulatory Visit: Payer: Self-pay

## 2021-01-17 ENCOUNTER — Encounter (HOSPITAL_COMMUNITY)
Admission: RE | Admit: 2021-01-17 | Discharge: 2021-01-17 | Disposition: A | Discharge status: Home / Self Care | Payer: Medicare Other | Source: Ambulatory Visit | Attending: Cardiology | Admitting: Cardiology

## 2021-01-17 DIAGNOSIS — I214 Non-ST elevation (NSTEMI) myocardial infarction: Secondary | ICD-10-CM

## 2021-01-17 DIAGNOSIS — N186 End stage renal disease: Secondary | ICD-10-CM | POA: Diagnosis not present

## 2021-01-17 DIAGNOSIS — Z992 Dependence on renal dialysis: Secondary | ICD-10-CM | POA: Diagnosis not present

## 2021-01-18 DIAGNOSIS — Z992 Dependence on renal dialysis: Secondary | ICD-10-CM | POA: Diagnosis not present

## 2021-01-18 DIAGNOSIS — N186 End stage renal disease: Secondary | ICD-10-CM | POA: Diagnosis not present

## 2021-01-18 NOTE — Progress Notes (Signed)
Cardiac Individual Treatment Plan  Patient Details  Name: Donald [name] MRN: [id number] Date of Birth: Aug 17, 1970 Referring Provider:   Flowsheet Row CARDIAC REHAB PHASE II ORIENTATION from 11/30/2020 in [place]  Referring Provider Oswaldo Milian, MD       Initial Encounter Date:  Donald [name] PHASE II ORIENTATION from 11/30/2020 in [place]  Date 11/30/20       Visit Diagnosis: 11/30/20 NSTEMI   Patient's Home Medications on Admission:  Current Outpatient Medications:    acetaminophen-codeine (TYLENOL #3) 300-30 MG tablet, Take 2 tablets by mouth every 8 (eight) hours as needed for moderate pain., Disp: 50 tablet, Rfl: 0   aspirin EC 81 MG EC tablet, Take 1 tablet (81 mg total) by mouth daily., Disp: 30 tablet, Rfl: 3   calcium acetate (PHOSLO) 667 MG capsule, Take 667-2,001 mg by mouth See admin instructions. Take 3 capsules by mouth with each meal & take 1 capsule with snacks, Disp: , Rfl:    carvedilol (COREG) 12.5 MG tablet, Take 1 tablet (12.5 mg total) by mouth 2 (two) times daily with a meal., Disp: 180 tablet, Rfl: 3   cinacalcet (SENSIPAR) 30 MG tablet, Take by mouth daily., Disp: , Rfl:    clopidogrel (PLAVIX) 75 MG tablet, Take 1 tablet (75 mg total) by mouth daily., Disp: 30 tablet, Rfl: 3   Continuous Blood Gluc Sensor ([place]) MISC, Use to check blood sugar at least 4 times daily. Change sensor Q 2 wks, Disp: 2 each, Rfl: 12   gentamicin cream (GARAMYCIN) 0.1 %, Apply 1 application topically every morning. Apply to exit site every day, Disp: , Rfl:    insulin aspart (NOVOLOG) 100 UNIT/ML FlexPen, Inject 6 Units into the skin 3 (three) times daily with meals., Disp: 15 mL, Rfl: 11   insulin glargine (LANTUS SOLOSTAR) 100 UNIT/ML Solostar Pen, Inject 20 Units into the skin daily. (Patient taking differently: Inject 20 Units into the skin every evening.), Disp: 15  mL, Rfl: 11   Insulin Pen Needle (TRUEPLUS PEN NEEDLES) 31G X 6 MM MISC, Use to inject Novolog TID and Lantus once daily. Total injections in 1 day = 4., Disp: 100 each, Rfl: 6   isosorbide dinitrate (ISORDIL) 20 MG tablet, Take 1 tablet (20 mg total) by mouth 2 (two) times daily., Disp: 180 tablet, Rfl: 3   Peritoneal Dialysis Solutions (DIALYSIS SOLUTION 2.5% LOW-MG/LOW-CA) 394 MOSM/L SOLN dianeal solution, As per nephro, Disp: , Rfl: 0   rosuvastatin (CRESTOR) 40 MG tablet, Take 40 mg by mouth in the morning., Disp: , Rfl:    senna (SENOKOT) 8.6 MG tablet, Take 2 tablets (17.2 mg total) by mouth daily. (Patient taking differently: Take 2 tablets by mouth daily as needed for constipation.), Disp: 30 tablet, Rfl: 3  Past Medical History: Past Medical History:  Diagnosis Date   Diabetes mellitus without complication ([place])    History of vitrectomy 2014   anterior, left   Hx of heart bypass surgery    Hypertension    Renal disorder    dialysis     Tobacco Use: Social History   Tobacco Use  Smoking Status Never  Smokeless Tobacco Never    Labs: Recent Review Flowsheet Data     Labs for ITP Cardiac and Pulmonary Rehab Latest Ref Rng & Units 07/14/2019 07/15/2019 05/20/2020 06/01/2020 09/09/2020   Cholestrol 100 - 199 mg/dL 134 - - - 152   LDLCALC 0 -  99 mg/dL 72 - - - 82   HDL >39 mg/dL 37(L) - - - 43   Trlycerides 0 - 149 mg/dL 127 - - - 154(H)   Hemoglobin A1c 4.8 - 5.6 % - 6.1(H) 6.2 6.4(H) -       Capillary Blood Glucose: Lab Results  Component Value Date   GLUCAP 112 (H) 12/15/2020   GLUCAP 137 (H) 12/15/2020   GLUCAP 130 (H) 12/08/2020   GLUCAP 113 (H) 12/06/2020   GLUCAP 118 (H) 12/06/2020     Exercise Target Goals: Exercise Program Goal: Individual exercise prescription set using results from initial 6 min walk test and THRR while considering  patient's activity barriers and safety.   Exercise Prescription Goal: Initial exercise prescription builds to 30-45 minutes  a day of aerobic activity, 2-3 days per week.  Home exercise guidelines will be given to patient during program as part of exercise prescription that the participant will acknowledge.  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 HR 99 bpm     Max Ex. BP 140/70     2 Minute Post BP 146/80  4 min post BP: 142/66              Oxygen Initial Assessment:   Oxygen Re-Evaluation:   Oxygen Discharge (Final Oxygen Re-Evaluation):   Initial Exercise Prescription:  Initial Exercise Prescription - 11/30/20 1530       Date of Initial Exercise RX and Referring Provider   Date 11/30/20    Referring Provider Oswaldo Milian, MD    Expected Discharge Date 01/28/21      NuStep   Level 2    SPM 75    Minutes 25    METs 2      Prescription Details   Frequency (times per week) 3    Duration Progress to 30 minutes of continuous aerobic without signs/symptoms of physical distress      Intensity   THRR 40-80% of Max Heartrate 69-138    Ratings of Perceived Exertion 11-13    Perceived Dyspnea 0-4       Progression   Progression Continue progressive overload as per policy without signs/symptoms or physical distress.      Resistance Training   Training Prescription Yes  Weight 4 lbs    Reps 10-15             Perform Capillary Blood Glucose checks as needed.  Exercise Prescription Changes:   Exercise Prescription Changes     Row Name 12/06/20 1500 12/29/20 1500 01/17/21 1600         Response to Exercise   Blood Pressure (Admit) 146/70 154/72 112/52     Blood Pressure (Exercise) 144/78 138/78 126/66     Blood Pressure (Exit) 148/62 122/72 140/74     Heart Rate (Admit) 88 bpm 92 bpm 68 bpm     Heart Rate (Exercise) 104 bpm 93 bpm 76 bpm     Heart Rate (Exit) 89 bpm 89 bpm 71 bpm     Rating of Perceived Exertion (Exercise) _0 Symptoms None None None     Comments Pt's first day in the CRP2 program Reviewed METs and goals Reviewed METs     Duration Progress to 30 minutes of  aerobic without signs/symptoms of physical distress Continue with 30 min of aerobic exercise without signs/symptoms of physical distress. Continue with 30 min of aerobic exercise without signs/symptoms of physical distress.     Intensity THRR unchanged THRR unchanged THRR unchanged           Progression   Progression Continue to progress workloads to maintain intensity without signs/symptoms of physical distress. Continue to progress workloads to maintain intensity without signs/symptoms of physical distress. Continue to progress workloads to maintain intensity without signs/symptoms of physical distress.     Average METs 1.6 1.8 2           Resistance Training   Training Prescription Yes No Yes     Weight 4 lbs No weights on Wednesdays 4 lbs     Reps 10-15 -- 10-15     Time 10 Minutes -- 10 Minutes           Interval Training   Interval Training No No No           NuStep   Level _1 SPM 75 80 85     Minutes _2 METs 1.6 1.8 2              Exercise  Comments:   Exercise Comments     Row Name 12/06/20 1500 12/17/20 1543 12/20/20 1541 12/29/20 1500 01/17/21 1608   Exercise Comments Pt's first day in the CRP2 program. No complaints with todays session. Reviewed MET. Pt has increased level to 3. Continue to encourage increased SPM to improve avg MET level, -- Reviewed METs and goals. Pt feels his wlaking has improved. Continue to encourage an increased MET level goal with exercise. Reviewed METs. Pt has progressed on durtaion to 30 minutes but is increasing MET level  slowly. Pt rates his RPE at 13 on nustep so will continue to encourage patient to progress exercise workloads as tolerated.            Exercise Goals and Review:   Exercise Goals     Row Name 11/30/20 1500             Exercise Goals   Increase Physical Activity Yes       Intervention Provide advice, education, support and counseling about physical activity/exercise needs.;Develop an individualized exercise prescription for aerobic and resistive training based on initial evaluation findings, risk stratification, comorbidities and participant's personal goals.  Expected Outcomes Long Term: Add in home exercise to make exercise part of routine and to increase amount of physical activity.;Long Term: Exercising regularly at least 3-5 days a week.;Short Term: Attend rehab on a regular basis to increase amount of physical activity.       Increase Strength and Stamina Yes       Intervention Provide advice, education, support and counseling about physical activity/exercise needs.;Develop an individualized exercise prescription for aerobic and resistive training based on initial evaluation findings, risk stratification, comorbidities and participant's personal goals.       Expected Outcomes Short Term: Increase workloads from initial exercise prescription for resistance, speed, and METs.;Short Term: Perform resistance training exercises routinely during rehab and add in  resistance training at home;Long Term: Improve cardiorespiratory fitness, muscular endurance and strength as measured by increased METs and functional capacity (6MWT)       Able to understand and use rate of perceived exertion (RPE) scale Yes       Intervention Provide education and explanation on how to use RPE scale       Expected Outcomes Long Term:  Able to use RPE to guide intensity level when exercising independently;Short Term: Able to use RPE daily in rehab to express subjective intensity level       Knowledge and understanding of Target Heart Rate Range (THRR) Yes       Intervention Provide education and explanation of THRR including how the numbers were predicted and where they are located for reference       Expected Outcomes Short Term: Able to state/look up THRR;Short Term: Able to use daily as guideline for intensity in rehab;Long Term: Able to use THRR to govern intensity when exercising independently       Understanding of Exercise Prescription Yes       Intervention Provide education, explanation, and written materials on patient's individual exercise prescription       Expected Outcomes Short Term: Able to explain program exercise prescription;Long Term: Able to explain home exercise prescription to exercise independently                Exercise Goals Re-Evaluation :  Exercise Goals Re-Evaluation     Row Name 12/06/20 1500 12/07/20 0747 12/29/20 1500         Exercise Goal Re-Evaluation   Exercise Goals Review Increase Physical Activity;Increase Strength and Stamina;Able to understand and use rate of perceived exertion (RPE) scale;Knowledge and understanding of Target Heart Rate Range (THRR);Understanding of Exercise Prescription -- Increase Physical Activity;Increase Strength and Stamina;Able to understand and use rate of perceived exertion (RPE) scale;Knowledge and understanding of Target Heart Rate Range (THRR);Able to check pulse independently;Understanding of Exercise  Prescription     Comments Pt's first day in the CRP2 program. Pt understands the exercise Rx, THRR and RPE scale. -- Reviewed METs and goals. Pt voices he feels like his walking has improved and that he has more endurance. Also voiced now walking up the stairs into his home and not have to use the rail to pull himself up.     Expected Outcomes Will continue to montor patient and progress exercise workloads as tolerated. -- Will continue to montor patient and progress exercise workloads as tolerated.              Discharge Exercise Prescription (Final Exercise Prescription Changes):  Exercise Prescription Changes - 01/17/21 1600       Response to Exercise   Blood Pressure (Admit) 112/52    Blood Pressure (Exercise)  126/66    Blood Pressure (Exit) 140/74    Heart Rate (Admit) 68 bpm    Heart Rate (Exercise) 76 bpm    Heart Rate (Exit) 71 bpm    Rating of Perceived Exertion (Exercise) 13    Symptoms None    Comments Reviewed METs    Duration Continue with 30 min of aerobic exercise without signs/symptoms of physical distress.    Intensity THRR unchanged      Progression   Progression Continue to progress workloads to maintain intensity without signs/symptoms of physical distress.    Average METs 2      Resistance Training   Training Prescription Yes    Weight 4 lbs    Reps 10-15    Time 10 Minutes      Interval Training   Interval Training No      NuStep   Level 4    SPM 85    Minutes 30    METs 2             Nutrition:  Target Goals: Understanding of nutrition guidelines, daily intake of sodium <1537m, cholesterol <2063m calories 30% from fat and 7% or less from saturated fats, daily to have 5 or more servings of fruits and vegetables.  Biometrics:  Pre Biometrics - 11/30/20 1415       Pre Biometrics   Waist Circumference 48 inches    Hip Circumference 46.5 inches    Waist to Hip Ratio 1.03 %    Triceps Skinfold 15 mm    % Body Fat 32.8 %    Grip  Strength 25 kg    Flexibility 13.75 in    Single Leg Stand 3 seconds              Nutrition Therapy Plan and Nutrition Goals:  Nutrition Therapy & Goals - 12/15/20 1442       Nutrition Therapy   Diet TLC    Drug/Food Interactions Statins/Certain Fruits      Personal Nutrition Goals   Nutrition Goal Pt to build a healthy plate including vegetables, fruits, whole grains, and low-fat dairy products in a heart healthy meal plan.    Personal Goal #2 Pt to eat a variety of non-starchy vegetables.    Personal Goal #3 Pt to eat more beans and peas      Intervention Plan   Intervention Nutrition handout(s) given to patient.;Prescribe, educate and counsel regarding individualized specific dietary modifications aiming towards targeted core components such as weight, hypertension, lipid management, diabetes, heart failure and other comorbidities.    Expected Outcomes Long Term Goal: Adherence to prescribed nutrition plan.;Short Term Goal: Understand basic principles of dietary content, such as calories, fat, sodium, cholesterol and nutrients.             Nutrition Assessments:  MEDIFICTS Score Key: ?70 Need to make dietary changes  40-70 Heart Healthy Diet ? 40 Therapeutic Level Cholesterol Diet   Flowsheet Row CARDIAC REHAB [facility]ASE [facility] [place] on [facility] 65      Picture Your Plate Scores: <4<23nhealthy dietary pattern with much room for improvement. 41-50 Dietary pattern unlikely to meet recommendations for good health and room for improvement. 51-60 More healthful dietary pattern, with some room for improvement.  >60 Healthy dietary pattern, although there may be some specific behaviors that could be improved.    Nutrition Goals Re-Evaluation:  Nutrition Goals Re-Evaluation  [place] Name 12/15/20 1443 12/20/20 0955           Goals   Current Weight 236 lb (107 kg) 238 lb 5.1  oz (108.1 kg)      Nutrition Goal Pt to build a healthy plate including vegetables, fruits, whole grains, and low-fat dairy products in a heart healthy meal plan. Pt to build a healthy plate including vegetables, fruits, whole grains, and low-fat dairy products in a heart healthy meal plan.             Personal Goal #2 Re-Evaluation   Personal Goal #2 Pt to eat a variety of non-starchy vegetables. Pt to eat a variety of non-starchy vegetables.             Personal Goal #3 Re-Evaluation   Personal Goal #3 Pt to eat more beans and peas Pt to eat more beans and peas               Nutrition Goals Re-Evaluation:  Nutrition Goals Re-Evaluation     [place] Name 12/15/20 1443 12/20/20 0955           Goals   Current Weight 236 lb (107 kg) 238 lb 5.1 oz (108.1 kg)      Nutrition Goal Pt to build a healthy plate including vegetables, fruits, whole grains, and low-fat dairy products in a heart healthy meal plan. Pt to build a healthy plate including vegetables, fruits, whole grains, and low-fat dairy products in a heart healthy meal plan.             Personal Goal #2 Re-Evaluation   Personal Goal #2 Pt to eat a variety of non-starchy vegetables. Pt to eat a variety of non-starchy vegetables.             Personal Goal #3 Re-Evaluation   Personal Goal #3 Pt to eat more beans and peas Pt to eat more beans and peas               Nutrition Goals Discharge (Final Nutrition Goals Re-Evaluation):  Nutrition Goals Re-Evaluation - 12/20/20 0955       Goals   Current Weight 238 lb 5.1 oz (108.1 kg)    Nutrition Goal Pt to build a healthy plate including vegetables, fruits, whole grains, and low-fat dairy products in a heart healthy meal plan.      Personal Goal #2 Re-Evaluation   Personal Goal #2 Pt to eat a variety of non-starchy vegetables.      Personal Goal #3 Re-Evaluation   Personal Goal #3 Pt to eat more beans and peas             Psychosocial: Target Goals: Acknowledge  presence or absence of significant depression and/or stress, maximize coping skills, provide positive support system. Participant is able to verbalize types and ability to use techniques and skills needed for reducing stress and depression.  Initial Review & Psychosocial Screening:  Initial Psych Review & Screening - 12/01/20 1017       Initial Review   Current issues with Current Stress Concerns    Source of Stress Concerns Chronic Illness    Comments Alcides has health concerns due to ESRD and CAD      Family Dynamics   Good Support System? Yes   Henok has his wife and children for support     Barriers   Psychosocial barriers to participate in program The patient should benefit from training in stress management and relaxation.  Screening Interventions   Interventions Encouraged to exercise    Expected Outcomes Long Term Goal: Stressors or current issues are controlled or eliminated.;Short Term goal: Identification and review with participant of any Quality of Life or Depression concerns found by scoring the questionnaire.             Quality of Life Scores:  Quality of Life - 11/30/20 1602       Quality of Life   Select Quality of Life      Quality of Life Scores   Health/Function Pre 9 %    Socioeconomic Pre 15.67 %    Psych/Spiritual Pre 12.36 %    Family Pre 22.5 %    GLOBAL Pre 13.09 %            Scores of 19 and below usually indicate a poorer quality of life in these areas.  A difference of  2-3 points is a clinically meaningful difference.  A difference of 2-3 points in the total score of the Quality of Life Index has been associated with significant improvement in overall quality of life, self-image, physical symptoms, and general health in studies assessing change in quality of life.  PHQ-9: Recent Review Flowsheet Data     Depression screen [facility] 2/9 12/01/2020 09/13/2020 08/23/2020 05/20/2020   Decreased Interest 0 _0 Down, Depressed, Hopeless 0 0  0 1   PHQ - 2 Score 0 _1 Altered sleeping - - - 3   Tired, decreased energy - - - 2   Change in appetite - - - 1   Feeling bad or failure about yourself  - - - 1   Trouble concentrating - - - 0   Moving slowly or fidgety/restless - - - 0   PHQ-9 Score - - - 9      Interpretation of Total Score  Total Score Depression Severity:  1-4 = Minimal depression, 5-9 = Mild depression, 10-14 = Moderate depression, 15-19 = Moderately severe depression, 20-27 = Severe depression   Psychosocial Evaluation and Intervention:   Psychosocial Re-Evaluation:  Psychosocial Re-Evaluation     [place] Name 12/21/20 1520 01/14/21 1744           Psychosocial Re-Evaluation   Current issues with Current Stress Concerns Current Stress Concerns      Comments Stepan continues to voice concerns over financial stressors  and health concerns due to his ESRD as he lives on his disability income along with his wifes income. Santonio son who lives in [place] was involved in a car accident and is currently hospitalized.      Expected Outcomes Will continue to offer support as needed. Current stressors will be controlled upon completion of phase 2 cardiac rehab. Will continue to offer support as needed. Current stressors will be controlled upon completion of phase 2 cardiac rehab.      Interventions Encouraged to attend Cardiac Rehabilitation for the exercise;Stress management education Encouraged to attend Cardiac Rehabilitation for the exercise;Stress management education      Continue Psychosocial Services  Follow up required by counselor Follow up required by counselor             Initial Review   Source of Stress Concerns Chronic Illness;Retirement/disability Chronic Illness;Retirement/disability      Comments -- Will continue to monitor and offer support as needed.               Psychosocial Discharge (Final Psychosocial Re-Evaluation):  Psychosocial Re-Evaluation -  01/14/21 1744        Psychosocial Re-Evaluation   Current issues with Current Stress Concerns    Comments Yader son who lives in [place] was involved in a car accident and is currently hospitalized.    Expected Outcomes Will continue to offer support as needed. Current stressors will be controlled upon completion of phase 2 cardiac rehab.    Interventions Encouraged to attend Cardiac Rehabilitation for the exercise;Stress management education    Continue Psychosocial Services  Follow up required by counselor      Initial Review   Source of Stress Concerns Chronic Illness;Retirement/disability    Comments Will continue to monitor and offer support as needed.             Vocational Rehabilitation: Provide vocational rehab assistance to qualifying candidates.   Vocational Rehab Evaluation & Intervention:  Vocational Rehab - 12/01/20 1021       Initial Vocational Rehab Evaluation & Intervention   Assessment shows need for Vocational Rehabilitation No   Trayvon is on disabity and does not need vocational rehab at this time.            Education: Education Goals: Education classes will be provided on a weekly basis, covering required topics. Participant will state understanding/return demonstration of topics presented.  Learning Barriers/Preferences:  Learning Barriers/Preferences - 11/30/20 1603       Learning Barriers/Preferences   Learning Barriers None    Learning Preferences Pictoral;Skilled Demonstration;Individual Instruction;Group Instruction;Video             Education Topics: Count Your Pulse:  -Group instruction provided by verbal instruction, demonstration, patient participation and written materials to support subject.  Instructors address importance of being able to find your pulse and how to count your pulse when at home without a heart monitor.  Patients get hands on experience counting their pulse with staff help and individually.   Heart Attack, Angina, and Risk  Factor Modification:  -Group instruction provided by verbal instruction, video, and written materials to support subject.  Instructors address signs and symptoms of angina and heart attacks.    Also discuss risk factors for heart disease and how to make changes to improve heart health risk factors.   Functional Fitness:  -Group instruction provided by verbal instruction, demonstration, patient participation, and written materials to support subject.  Instructors address safety measures for doing things around the house.  Discuss how to get up and down off the floor, how to pick things up properly, how to safely get out of a chair without assistance, and balance training.   Meditation and Mindfulness:  -Group instruction provided by verbal instruction, patient participation, and written materials to support subject.  Instructor addresses importance of mindfulness and meditation practice to help reduce stress and improve awareness.  Instructor also leads participants through a meditation exercise.    Stretching for Flexibility and Mobility:  -Group instruction provided by verbal instruction, patient participation, and written materials to support subject.  Instructors lead participants through series of stretches that are designed to increase flexibility thus improving mobility.  These stretches are additional exercise for major muscle groups that are typically performed during regular warm up and cool down.   Hands Only CPR:  -Group verbal, video, and participation provides a basic overview of AHA guidelines for community CPR. Role-play of emergencies allow participants the opportunity to practice calling for help and chest compression technique with discussion of AED use.   Hypertension: -Group verbal and written instruction that provides a basic  overview of hypertension including the most recent diagnostic guidelines, risk factor reduction with self-care instructions and medication  management.    Nutrition I class: Heart Healthy Eating:  -Group instruction provided by PowerPoint slides, verbal discussion, and written materials to support subject matter. The instructor gives an explanation and review of the Therapeutic Lifestyle Changes diet recommendations, which includes a discussion on lipid goals, dietary fat, sodium, fiber, plant stanol/sterol esters, sugar, and the components of a well-balanced, healthy diet.   Nutrition II class: Lifestyle Skills:  -Group instruction provided by PowerPoint slides, verbal discussion, and written materials to support subject matter. The instructor gives an explanation and review of label reading, grocery shopping for heart health, heart healthy recipe modifications, and ways to make healthier choices when eating out.   Diabetes Question & Answer:  -Group instruction provided by PowerPoint slides, verbal discussion, and written materials to support subject matter. The instructor gives an explanation and review of diabetes co-morbidities, pre- and post-prandial blood glucose goals, pre-exercise blood glucose goals, signs, symptoms, and treatment of hypoglycemia and hyperglycemia, and foot care basics.   Diabetes Blitz:  -Group instruction provided by PowerPoint slides, verbal discussion, and written materials to support subject matter. The instructor gives an explanation and review of the physiology behind type 1 and type 2 diabetes, diabetes medications and rational behind using different medications, pre- and post-prandial blood glucose recommendations and Hemoglobin A1c goals, diabetes diet, and exercise including blood glucose guidelines for exercising safely.    Portion Distortion:  -Group instruction provided by PowerPoint slides, verbal discussion, written materials, and food models to support subject matter. The instructor gives an explanation of serving size versus portion size, changes in portions sizes over the last 20 years,  and what consists of a serving from each food group.   Stress Management:  -Group instruction provided by verbal instruction, video, and written materials to support subject matter.  Instructors review role of stress in heart disease and how to cope with stress positively.     Exercising on Your Own:  -Group instruction provided by verbal instruction, power point, and written materials to support subject.  Instructors discuss benefits of exercise, components of exercise, frequency and intensity of exercise, and end points for exercise.  Also discuss use of nitroglycerin and activating EMS.  Review options of places to exercise outside of rehab.  Review guidelines for sex with heart disease.   Cardiac Drugs I:  -Group instruction provided by verbal instruction and written materials to support subject.  Instructor reviews cardiac drug classes: antiplatelets, anticoagulants, beta blockers, and statins.  Instructor discusses reasons, side effects, and lifestyle considerations for each drug class.   Cardiac Drugs II:  -Group instruction provided by verbal instruction and written materials to support subject.  Instructor reviews cardiac drug classes: angiotensin converting enzyme inhibitors (ACE-I), angiotensin II receptor blockers (ARBs), nitrates, and calcium channel blockers.  Instructor discusses reasons, side effects, and lifestyle considerations for each drug class.   Anatomy and Physiology of the Circulatory System:  Group verbal and written instruction and models provide basic cardiac anatomy and physiology, with the coronary electrical and arterial systems. Review of: AMI, Angina, Valve disease, Heart Failure, Peripheral Artery Disease, Cardiac Arrhythmia, Pacemakers, and the ICD.   Other Education:  -Group or individual verbal, written, or video instructions that support the educational goals of the cardiac rehab program.   Holiday Eating Survival Tips:  -Group instruction provided by  PowerPoint slides, verbal discussion, and written materials to support subject matter. The instructor  gives patients tips, tricks, and techniques to help them not only survive but enjoy the holidays despite the onslaught of food that accompanies the holidays.   Knowledge Questionnaire Score:  Knowledge Questionnaire Score - 11/30/20 1602       Knowledge Questionnaire Score   Pre Score 20/24             Core Components/Risk Factors/Patient Goals at Admission:  Personal Goals and Risk Factors at Admission - 11/30/20 1604       Core Components/Risk Factors/Patient Goals on Admission    Weight Management Yes;Obesity;Weight Loss    Intervention Weight Management: Develop a combined nutrition and exercise program designed to reach desired caloric intake, while maintaining appropriate intake of nutrient and fiber, sodium and fats, and appropriate energy expenditure required for the weight goal.;Weight Management: Provide education and appropriate resources to help participant work on and attain dietary goals.;Weight Management/Obesity: Establish reasonable short term and long term weight goals.;Obesity: Provide education and appropriate resources to help participant work on and attain dietary goals.    Admit Weight 236 lb 5.3 oz (107.2 kg)    Expected Outcomes Short Term: Continue to assess and modify interventions until short term weight is achieved;Long Term: Adherence to nutrition and physical activity/exercise program aimed toward attainment of established weight goal;Weight Maintenance: Understanding of the daily nutrition guidelines, which includes 25-35% calories from fat, 7% or less cal from saturated fats, less than 257m cholesterol, less than 1.5gm of sodium, & 5 or more servings of fruits and vegetables daily;Weight Loss: Understanding of general recommendations for a balanced deficit meal plan, which promotes 1-2 lb weight loss per week and includes a negative energy balance of [phone]  kcal/d;Understanding recommendations for meals to include 15-35% energy as protein, 25-35% energy from fat, 35-60% energy from carbohydrates, less than 2041mof dietary cholesterol, 20-35 gm of total fiber daily;Understanding of distribution of calorie intake throughout the day with the consumption of 4-5 meals/snacks    Diabetes Yes    Intervention Provide education about signs/symptoms and action to take for hypo/hyperglycemia.;Provide education about proper nutrition, including hydration, and aerobic/resistive exercise prescription along with prescribed medications to achieve blood glucose in normal ranges: Fasting glucose 65-99 mg/dL    Expected Outcomes Short Term: Participant verbalizes understanding of the signs/symptoms and immediate care of hyper/hypoglycemia, proper foot care and importance of medication, aerobic/resistive exercise and nutrition plan for blood glucose control.;Long Term: Attainment of HbA1C < 7%.    Heart Failure Yes    Intervention Provide a combined exercise and nutrition program that is supplemented with education, support and counseling about heart failure. Directed toward relieving symptoms such as shortness of breath, decreased exercise tolerance, and extremity edema.    Expected Outcomes Short term: Attendance in program 2-3 days a week with increased exercise capacity. Reported lower sodium intake. Reported increased fruit and vegetable intake. Reports medication compliance.;Improve functional capacity of life;Short term: Daily weights obtained and reported for increase. Utilizing diuretic protocols set by physician.;Long term: Adoption of self-care skills and reduction of barriers for early signs and symptoms recognition and intervention leading to self-care maintenance.    Hypertension Yes    Intervention Provide education on lifestyle modifcations including regular physical activity/exercise, weight management, moderate sodium restriction and increased consumption of  fresh fruit, vegetables, and low fat dairy, alcohol moderation, and smoking cessation.;Monitor prescription use compliance.    Expected Outcomes Short Term: Continued assessment and intervention until BP is < 140/9057mG in hypertensive participants. < 130/31m32m in hypertensive participants with  diabetes, heart failure or chronic kidney disease.;Long Term: Maintenance of blood pressure at goal levels.    Lipids Yes    Intervention Provide education and support for participant on nutrition & aerobic/resistive exercise along with prescribed medications to achieve LDL <80m, HDL >481m    Expected Outcomes Short Term: Participant states understanding of desired cholesterol values and is compliant with medications prescribed. Participant is following exercise prescription and nutrition guidelines.;Long Term: Cholesterol controlled with medications as prescribed, with individualized exercise RX and with personalized nutrition plan. Value goals: LDL < 7062mHDL > 40 mg.    Stress Yes    Intervention Offer individual and/or small group education and counseling on adjustment to heart disease, stress management and health-related lifestyle change. Teach and support self-help strategies.;Refer participants experiencing significant psychosocial distress to appropriate mental health specialists for further evaluation and treatment. When possible, include family members and significant others in education/counseling sessions.    Expected Outcomes Short Term: Participant demonstrates changes in health-related behavior, relaxation and other stress management skills, ability to obtain effective social support, and compliance with psychotropic medications if prescribed.;Long Term: Emotional wellbeing is indicated by absence of clinically significant psychosocial distress or social isolation.             Core Components/Risk Factors/Patient Goals Review:   Goals and Risk Factor Review     Row Name 12/21/20 1526  01/14/21 1746           Core Components/Risk Factors/Patient Goals Review   Personal Goals Review Weight Management/Obesity;Stress;Hypertension;Lipids;Diabetes Weight Management/Obesity;Stress;Hypertension;Lipids;Diabetes      Review KenJarreaus been doing well with exercise. [place]'s systlic Bp's have had some moderate elevations. Jame's weight was up on 12/20/20. Patient [facility] Gra[facility] been doing well with exercise. Vital sings  and CBG'shave been variable. Weights vary due to peritoneal dialysis.      Expected Outcomes KenMontreyll continue to participate in phase 2 cardiac rehab for exercise, nutrition and lifestyle modifications. KenWillemll continue to participate in phase 2 cardiac rehab for exercise, nutrition and lifestyle modifications.               Core Components/Risk Factors/Patient Goals at Discharge (Final Review):   Goals and Risk Factor Review - 01/14/21 1746       Core Components/Risk Factors/Patient Goals Review   Personal Goals Review Weight Management/Obesity;Stress;Hypertension;Lipids;Diabetes    Review KenKeahis been doing well with exercise. Vital sings  and CBG'shave been variable. Weights vary due to peritoneal dialysis.    Expected Outcomes KenEdwingll continue to participate in phase 2 cardiac rehab for exercise, nutrition and lifestyle modifications.             ITP Comments:  ITP Comments     Row Name 12/01/20 0841 12/21/20 1517 01/14/21 1744       ITP Comments Dr TraFransico Him, Medical Director 30 Day ITP Review. KenShahzaibs good attendance and participation in phase 2 cardiac rehab. KenEunice off to a good start to exercise. 30 Day ITP Review. KenEliyahs good attendance and participation in phase 2 cardiac rehab since cleared to return to exercise.              Comments: See ITP comments

## 2021-01-19 ENCOUNTER — Other Ambulatory Visit: Payer: Self-pay

## 2021-01-19 ENCOUNTER — Encounter (HOSPITAL_COMMUNITY)
Admission: RE | Admit: 2021-01-19 | Discharge: 2021-01-19 | Disposition: A | Discharge status: Home / Self Care | Payer: Medicare Other | Source: Ambulatory Visit | Attending: Cardiology | Admitting: Cardiology

## 2021-01-19 DIAGNOSIS — Z992 Dependence on renal dialysis: Secondary | ICD-10-CM | POA: Diagnosis not present

## 2021-01-19 DIAGNOSIS — N186 End stage renal disease: Secondary | ICD-10-CM | POA: Diagnosis not present

## 2021-01-19 DIAGNOSIS — I214 Non-ST elevation (NSTEMI) myocardial infarction: Secondary | ICD-10-CM

## 2021-01-19 NOTE — Progress Notes (Signed)
Patient reported at arrival screening/check-in room experiencing some dizziness on the way to cardiac rehab and currently somewhat dizzy. Weight today 106.8 kg, Monday weight at cardiac rehab check-in was 108.2kg. Initial BP taken by Rodney Langton RN revealed BP 92/50 then recheck approximately five minutes later BP 88/50.  Provided 8oz of water recheck approximately five minutes later was 80/50. Patient alert, oriented, able to carry on conversations, warm/dry to the touch. Patient shared he had very little sleep due to taking care of grandbaby through the night. Provided an additional 8oz of water. Recheck blood pressure was 91/54. Approximately 5 minutes later recheck was 85/48, legs were elevated for approximately 10 minutes while seated in the chair. Patient had called wife to pick him up from cardiac rehab. Exercise held today due to BPs and reports of dizziness and lack of sleep. BP 90/55, Rodney Langton RN walked with patient to the vehicle just outside of cardiac rehab department without incident.

## 2021-01-20 DIAGNOSIS — N186 End stage renal disease: Secondary | ICD-10-CM | POA: Diagnosis not present

## 2021-01-20 DIAGNOSIS — D509 Iron deficiency anemia, unspecified: Secondary | ICD-10-CM | POA: Diagnosis not present

## 2021-01-20 DIAGNOSIS — Z992 Dependence on renal dialysis: Secondary | ICD-10-CM | POA: Diagnosis not present

## 2021-01-21 ENCOUNTER — Other Ambulatory Visit: Payer: Self-pay

## 2021-01-21 ENCOUNTER — Encounter (HOSPITAL_COMMUNITY)
Admission: RE | Admit: 2021-01-21 | Discharge: 2021-01-21 | Disposition: A | Discharge status: Home / Self Care | Payer: Medicare Other | Source: Ambulatory Visit | Attending: Cardiology | Admitting: Cardiology

## 2021-01-21 DIAGNOSIS — I214 Non-ST elevation (NSTEMI) myocardial infarction: Secondary | ICD-10-CM

## 2021-01-21 DIAGNOSIS — Z992 Dependence on renal dialysis: Secondary | ICD-10-CM | POA: Diagnosis not present

## 2021-01-21 DIAGNOSIS — N186 End stage renal disease: Secondary | ICD-10-CM | POA: Diagnosis not present

## 2021-01-21 NOTE — Progress Notes (Signed)
I have reviewed a Home Exercise Prescription with [name] . Sohan is not currently exercising at home.  The patient was advised to walk 2 days a week for 30 minutes.  Jakorey and I discussed how to progress his exercise prescription.  The patient stated that he understood the exercise prescription.  We reviewed exercise guidelines, target heart rate during exercise, RPE Scale, weather conditions, endpoints for exercise, warmup and cool down.  Patient is encouraged to come to me with any questions. I will continue to follow up with the patient to assist them with progression and safety.     Lesly Rubenstein MS, ACSM-CEP, CCRP

## 2021-01-22 DIAGNOSIS — Z992 Dependence on renal dialysis: Secondary | ICD-10-CM | POA: Diagnosis not present

## 2021-01-22 DIAGNOSIS — N186 End stage renal disease: Secondary | ICD-10-CM | POA: Diagnosis not present

## 2021-01-23 DIAGNOSIS — Z992 Dependence on renal dialysis: Secondary | ICD-10-CM | POA: Diagnosis not present

## 2021-01-23 DIAGNOSIS — N186 End stage renal disease: Secondary | ICD-10-CM | POA: Diagnosis not present

## 2021-01-24 ENCOUNTER — Other Ambulatory Visit: Payer: Self-pay

## 2021-01-24 ENCOUNTER — Encounter (HOSPITAL_COMMUNITY): Admission: RE | Admit: 2021-01-24 | Payer: Medicare Other | Source: Ambulatory Visit

## 2021-01-24 DIAGNOSIS — N186 End stage renal disease: Secondary | ICD-10-CM | POA: Diagnosis not present

## 2021-01-24 DIAGNOSIS — Z992 Dependence on renal dialysis: Secondary | ICD-10-CM | POA: Diagnosis not present

## 2021-01-25 DIAGNOSIS — N186 End stage renal disease: Secondary | ICD-10-CM | POA: Diagnosis not present

## 2021-01-25 DIAGNOSIS — Z992 Dependence on renal dialysis: Secondary | ICD-10-CM | POA: Diagnosis not present

## 2021-01-26 ENCOUNTER — Encounter (HOSPITAL_COMMUNITY): Payer: Medicare Other

## 2021-01-26 DIAGNOSIS — Z992 Dependence on renal dialysis: Secondary | ICD-10-CM | POA: Diagnosis not present

## 2021-01-26 DIAGNOSIS — N186 End stage renal disease: Secondary | ICD-10-CM | POA: Diagnosis not present

## 2021-01-27 DIAGNOSIS — N186 End stage renal disease: Secondary | ICD-10-CM | POA: Diagnosis not present

## 2021-01-27 DIAGNOSIS — Z992 Dependence on renal dialysis: Secondary | ICD-10-CM | POA: Diagnosis not present

## 2021-01-28 ENCOUNTER — Encounter (HOSPITAL_COMMUNITY)
Admission: RE | Admit: 2021-01-28 | Discharge: 2021-01-28 | Disposition: A | Discharge status: Home / Self Care | Payer: Medicare Other | Source: Ambulatory Visit | Attending: Cardiology | Admitting: Cardiology

## 2021-01-28 ENCOUNTER — Other Ambulatory Visit: Payer: Self-pay

## 2021-01-28 VITALS — Ht 70.0 in | Wt 235.7 lb

## 2021-01-28 DIAGNOSIS — I5023 Acute on chronic systolic (congestive) heart failure: Secondary | ICD-10-CM | POA: Diagnosis not present

## 2021-01-28 DIAGNOSIS — N186 End stage renal disease: Secondary | ICD-10-CM | POA: Insufficient documentation

## 2021-01-28 DIAGNOSIS — Z794 Long term (current) use of insulin: Secondary | ICD-10-CM | POA: Diagnosis not present

## 2021-01-28 DIAGNOSIS — Z951 Presence of aortocoronary bypass graft: Secondary | ICD-10-CM | POA: Diagnosis not present

## 2021-01-28 DIAGNOSIS — R0789 Other chest pain: Secondary | ICD-10-CM | POA: Diagnosis not present

## 2021-01-28 DIAGNOSIS — Z20822 Contact with and (suspected) exposure to covid-19: Secondary | ICD-10-CM | POA: Diagnosis not present

## 2021-01-28 DIAGNOSIS — I132 Hypertensive heart and chronic kidney disease with heart failure and with stage 5 chronic kidney disease, or end stage renal disease: Secondary | ICD-10-CM | POA: Diagnosis not present

## 2021-01-28 DIAGNOSIS — Z79899 Other long term (current) drug therapy: Secondary | ICD-10-CM | POA: Insufficient documentation

## 2021-01-28 DIAGNOSIS — R079 Chest pain, unspecified: Secondary | ICD-10-CM | POA: Diagnosis not present

## 2021-01-28 DIAGNOSIS — E1122 Type 2 diabetes mellitus with diabetic chronic kidney disease: Secondary | ICD-10-CM | POA: Diagnosis not present

## 2021-01-28 DIAGNOSIS — Z7902 Long term (current) use of antithrombotics/antiplatelets: Secondary | ICD-10-CM | POA: Insufficient documentation

## 2021-01-28 DIAGNOSIS — I214 Non-ST elevation (NSTEMI) myocardial infarction: Secondary | ICD-10-CM

## 2021-01-28 DIAGNOSIS — Z992 Dependence on renal dialysis: Secondary | ICD-10-CM | POA: Insufficient documentation

## 2021-01-28 DIAGNOSIS — Z7982 Long term (current) use of aspirin: Secondary | ICD-10-CM | POA: Diagnosis not present

## 2021-01-29 ENCOUNTER — Encounter (HOSPITAL_COMMUNITY): Payer: Self-pay | Admitting: Emergency Medicine

## 2021-01-29 ENCOUNTER — Other Ambulatory Visit: Payer: Self-pay

## 2021-01-29 ENCOUNTER — Observation Stay (HOSPITAL_COMMUNITY)
Admission: EM | Admit: 2021-01-29 | Discharge: 2021-01-29 | Disposition: A | Discharge status: Home / Self Care | Payer: Medicare Other | Attending: Emergency Medicine | Admitting: Emergency Medicine

## 2021-01-29 ENCOUNTER — Emergency Department (HOSPITAL_COMMUNITY): Payer: Medicare Other

## 2021-01-29 DIAGNOSIS — R079 Chest pain, unspecified: Secondary | ICD-10-CM | POA: Diagnosis not present

## 2021-01-29 DIAGNOSIS — E785 Hyperlipidemia, unspecified: Secondary | ICD-10-CM | POA: Diagnosis present

## 2021-01-29 DIAGNOSIS — E1142 Type 2 diabetes mellitus with diabetic polyneuropathy: Secondary | ICD-10-CM | POA: Diagnosis present

## 2021-01-29 DIAGNOSIS — I10 Essential (primary) hypertension: Secondary | ICD-10-CM | POA: Diagnosis present

## 2021-01-29 DIAGNOSIS — N186 End stage renal disease: Secondary | ICD-10-CM | POA: Diagnosis not present

## 2021-01-29 DIAGNOSIS — Z951 Presence of aortocoronary bypass graft: Secondary | ICD-10-CM

## 2021-01-29 DIAGNOSIS — Z992 Dependence on renal dialysis: Secondary | ICD-10-CM

## 2021-01-29 DIAGNOSIS — I2510 Atherosclerotic heart disease of native coronary artery without angina pectoris: Secondary | ICD-10-CM

## 2021-01-29 HISTORY — DX: Hyperlipidemia, unspecified: E78.5

## 2021-01-29 HISTORY — DX: Atherosclerotic heart disease of native coronary artery without angina pectoris: I25.10

## 2021-01-29 HISTORY — DX: Unspecified systolic (congestive) heart failure: I50.20

## 2021-01-29 HISTORY — DX: End stage renal disease: N18.6

## 2021-01-29 HISTORY — DX: Dependence on renal dialysis: Z99.2

## 2021-01-29 LAB — COMPREHENSIVE METABOLIC PANEL
ALT: 20 U/L (ref 0–44)
AST: 16 U/L (ref 15–41)
Albumin: 2.9 g/dL — ABNORMAL LOW (ref 3.5–5.0)
Alkaline Phosphatase: 117 U/L (ref 38–126)
Anion gap: 14 (ref 5–15)
BUN: 84 mg/dL — ABNORMAL HIGH (ref 6–20)
CO2: 25 mmol/L (ref 22–32)
Calcium: 9.5 mg/dL (ref 8.9–10.3)
Chloride: 93 mmol/L — ABNORMAL LOW (ref 98–111)
Creatinine, Ser: 18.46 mg/dL — ABNORMAL HIGH (ref 0.61–1.24)
GFR, Estimated: 3 mL/min — ABNORMAL LOW (ref >60)
Glucose, Bld: 128 mg/dL — ABNORMAL HIGH (ref 70–99)
Potassium: 5.6 mmol/L — ABNORMAL HIGH (ref 3.5–5.1)
Sodium: 132 mmol/L — ABNORMAL LOW (ref 135–145)
Total Bilirubin: 0.9 mg/dL (ref 0.3–1.2)
Total Protein: 6.2 g/dL — ABNORMAL LOW (ref 6.5–8.1)

## 2021-01-29 LAB — I-STAT CHEM 8, ED
BUN: 83 mg/dL — ABNORMAL HIGH (ref 6–20)
Calcium, Ion: 1.11 mmol/L — ABNORMAL LOW (ref 1.15–1.40)
Chloride: 96 mmol/L — ABNORMAL LOW (ref 98–111)
Creatinine, Ser: >18 mg/dL — ABNORMAL HIGH (ref 0.61–1.24)
Glucose, Bld: 121 mg/dL — ABNORMAL HIGH (ref 70–99)
HCT: 31 % — ABNORMAL LOW (ref 39.0–52.0)
Hemoglobin: 10.5 g/dL — ABNORMAL LOW (ref 13.0–17.0)
Potassium: 5.3 mmol/L — ABNORMAL HIGH (ref 3.5–5.1)
Sodium: 132 mmol/L — ABNORMAL LOW (ref 135–145)
TCO2: 27 mmol/L (ref 22–32)

## 2021-01-29 LAB — CBC
HCT: 30.8 % — ABNORMAL LOW (ref 39.0–52.0)
Hemoglobin: 10 g/dL — ABNORMAL LOW (ref 13.0–17.0)
MCH: 30.8 pg (ref 26.0–34.0)
MCHC: 32.5 g/dL (ref 30.0–36.0)
MCV: 94.8 fL (ref 80.0–100.0)
Platelets: 210 10*3/uL (ref 150–400)
RBC: 3.25 MIL/uL — ABNORMAL LOW (ref 4.22–5.81)
RDW: 14.7 % (ref 11.5–15.5)
WBC: 5.1 10*3/uL (ref 4.0–10.5)
nRBC: 0 % (ref 0.0–0.2)

## 2021-01-29 LAB — HIV ANTIBODY (ROUTINE TESTING W REFLEX): HIV Screen 4th Generation wRfx: NONREACTIVE

## 2021-01-29 LAB — RESP PANEL BY RT-PCR (FLU A&B, COVID) ARPGX2
Influenza A by PCR: NEGATIVE
Influenza B by PCR: NEGATIVE
SARS Coronavirus 2 by RT PCR: NEGATIVE

## 2021-01-29 LAB — CBG MONITORING, ED: Glucose-Capillary: 104 mg/dL — ABNORMAL HIGH (ref 70–99)

## 2021-01-29 LAB — TROPONIN I (HIGH SENSITIVITY)
Troponin I (High Sensitivity): 104 ng/L (ref <18)
Troponin I (High Sensitivity): 109 ng/L (ref <18)

## 2021-01-29 MED ORDER — CALCIUM ACETATE (PHOS BINDER) 667 MG PO CAPS
667.0000 mg | ORAL_CAPSULE | ORAL | Status: DC
  Administered 2021-01-29: 667 mg via ORAL
  Filled 2021-01-29: qty 1

## 2021-01-29 MED ORDER — CAMPHOR-MENTHOL 0.5-0.5 % EX LOTN: 1.0000 "application " | TOPICAL_LOTION | Freq: Three times a day (TID) | CUTANEOUS | Status: DC | PRN

## 2021-01-29 MED ORDER — ISOSORBIDE DINITRATE 30 MG PO TABS: 30.0000 mg | ORAL_TABLET | Freq: Two times a day (BID) | ORAL | 11 refills | Status: AC

## 2021-01-29 MED ORDER — INSULIN GLARGINE-YFGN 100 UNIT/ML ~~LOC~~ SOLN: 20.0000 [IU] | Freq: Every evening | SUBCUTANEOUS | Status: DC

## 2021-01-29 MED ORDER — ZOLPIDEM TARTRATE 5 MG PO TABS: 5.0000 mg | ORAL_TABLET | Freq: Every evening | ORAL | Status: DC | PRN

## 2021-01-29 MED ORDER — NEPRO/CARBSTEADY PO LIQD: 237.0000 mL | Freq: Three times a day (TID) | ORAL | Status: DC | PRN

## 2021-01-29 MED ORDER — INSULIN ASPART 100 UNIT/ML IJ SOLN: 0.0000 [IU] | Freq: Three times a day (TID) | INTRAMUSCULAR | Status: DC

## 2021-01-29 MED ORDER — DOCUSATE SODIUM 283 MG RE ENEM: 1.0000 | ENEMA | RECTAL | Status: DC | PRN

## 2021-01-29 MED ORDER — HEPARIN SODIUM (PORCINE) 5000 UNIT/ML IJ SOLN
5000.0000 [IU] | Freq: Three times a day (TID) | INTRAMUSCULAR | Status: DC
  Administered 2021-01-29: 5000 [IU] via SUBCUTANEOUS
  Filled 2021-01-29: qty 1

## 2021-01-29 MED ORDER — SENNA 8.6 MG PO TABS: ORAL_TABLET | Freq: Every day | ORAL | Status: DC | PRN

## 2021-01-29 MED ORDER — ASPIRIN EC 81 MG PO TBEC
81.0000 mg | DELAYED_RELEASE_TABLET | Freq: Every day | ORAL | Status: DC
  Administered 2021-01-29: 81 mg via ORAL
  Filled 2021-01-29: qty 1

## 2021-01-29 MED ORDER — ACETAMINOPHEN 325 MG PO TABS: 650.0000 mg | ORAL_TABLET | ORAL | Status: DC | PRN

## 2021-01-29 MED ORDER — ACETAMINOPHEN 650 MG RE SUPP: 650.0000 mg | Freq: Four times a day (QID) | RECTAL | Status: DC | PRN

## 2021-01-29 MED ORDER — SORBITOL 70 % SOLN: 30.0000 mL | Status: DC | PRN

## 2021-01-29 MED ORDER — SODIUM ZIRCONIUM CYCLOSILICATE 10 G PO PACK
10.0000 g | PACK | Freq: Once | ORAL | Status: AC
  Administered 2021-01-29: 10 g via ORAL
  Filled 2021-01-29: qty 1

## 2021-01-29 MED ORDER — CARVEDILOL 12.5 MG PO TABS
12.5000 mg | ORAL_TABLET | Freq: Two times a day (BID) | ORAL | Status: DC
  Filled 2021-01-29: qty 1

## 2021-01-29 MED ORDER — ROSUVASTATIN CALCIUM 20 MG PO TABS
40.0000 mg | ORAL_TABLET | Freq: Every morning | ORAL | Status: DC
  Administered 2021-01-29: 40 mg via ORAL
  Filled 2021-01-29: qty 2

## 2021-01-29 MED ORDER — CALCIUM CARBONATE ANTACID 1250 MG/5ML PO SUSP: 500.0000 mg | Freq: Four times a day (QID) | ORAL | Status: DC | PRN

## 2021-01-29 MED ORDER — INSULIN GLARGINE 100 UNIT/ML SOLOSTAR PEN: 20.0000 [IU] | PEN_INJECTOR | Freq: Every evening | SUBCUTANEOUS | Status: DC

## 2021-01-29 MED ORDER — NITROGLYCERIN 0.4 MG SL SUBL: 0.4000 mg | SUBLINGUAL_TABLET | SUBLINGUAL | 12 refills | Status: AC | PRN

## 2021-01-29 MED ORDER — ASPIRIN 81 MG PO CHEW
243.0000 mg | CHEWABLE_TABLET | Freq: Once | ORAL | Status: AC
  Administered 2021-01-29: 243 mg via ORAL
  Filled 2021-01-29: qty 3

## 2021-01-29 MED ORDER — GENTAMICIN SULFATE 0.1 % EX CREA
1.0000 "application " | TOPICAL_CREAM | Freq: Every morning | CUTANEOUS | Status: DC
  Filled 2021-01-29: qty 15

## 2021-01-29 MED ORDER — ACETAMINOPHEN-CODEINE #3 300-30 MG PO TABS: 2.0000 | ORAL_TABLET | Freq: Three times a day (TID) | ORAL | Status: DC | PRN

## 2021-01-29 MED ORDER — ISOSORBIDE DINITRATE 20 MG PO TABS
20.0000 mg | ORAL_TABLET | Freq: Two times a day (BID) | ORAL | Status: DC
  Filled 2021-01-29 (×2): qty 1

## 2021-01-29 MED ORDER — CLOPIDOGREL BISULFATE 75 MG PO TABS
75.0000 mg | ORAL_TABLET | Freq: Every day | ORAL | Status: DC
  Administered 2021-01-29: 75 mg via ORAL
  Filled 2021-01-29: qty 1

## 2021-01-29 MED ORDER — NITROGLYCERIN 0.4 MG SL SUBL: 0.4000 mg | SUBLINGUAL_TABLET | SUBLINGUAL | Status: DC | PRN

## 2021-01-29 MED ORDER — HYDROXYZINE HCL 25 MG PO TABS: 25.0000 mg | ORAL_TABLET | Freq: Three times a day (TID) | ORAL | Status: DC | PRN

## 2021-01-29 MED ORDER — ONDANSETRON HCL 4 MG/2ML IJ SOLN
4.0000 mg | Freq: Once | INTRAMUSCULAR | Status: AC
  Administered 2021-01-29: 4 mg via INTRAVENOUS
  Filled 2021-01-29: qty 2

## 2021-01-29 MED ORDER — DELFLEX-LC/2.5% DEXTROSE 394 MOSM/L IP SOLN: INTRAPERITONEAL | Status: DC

## 2021-01-29 MED ORDER — CALCIUM ACETATE (PHOS BINDER) 667 MG PO CAPS
2001.0000 mg | ORAL_CAPSULE | Freq: Three times a day (TID) | ORAL | Status: DC
  Administered 2021-01-29: 2001 mg via ORAL
  Filled 2021-01-29: qty 3

## 2021-01-29 MED ORDER — ISOSORBIDE DINITRATE 30 MG PO TABS: 30.0000 mg | ORAL_TABLET | Freq: Two times a day (BID) | ORAL | Status: DC

## 2021-01-29 MED ORDER — MORPHINE SULFATE (PF) 4 MG/ML IV SOLN
4.0000 mg | Freq: Once | INTRAVENOUS | Status: AC
Start: 2021-01-29 — End: 2021-01-29
  Administered 2021-01-29: 4 mg via INTRAVENOUS
  Filled 2021-01-29: qty 1

## 2021-01-29 MED ORDER — ACETAMINOPHEN 325 MG PO TABS: 650.0000 mg | ORAL_TABLET | Freq: Four times a day (QID) | ORAL | Status: DC | PRN

## 2021-01-29 MED ORDER — CALCIUM ACETATE (PHOS BINDER) 667 MG PO CAPS: 667.0000 mg | ORAL_CAPSULE | ORAL | Status: DC

## 2021-01-29 MED ORDER — HEPARIN 1000 UNIT/ML FOR PERITONEAL DIALYSIS: 500.0000 [IU] | INTRAMUSCULAR | Status: DC | PRN

## 2021-01-29 NOTE — ED Provider Notes (Signed)
Emergency Medicine Provider Triage Evaluation Note  Donald [name] , a 50 y.o. male  was evaluated in triage.  Pt complains of chest pain.  The patient endorses sharp, pinching Alekh chest pain to the left chest that radiates through to the back and down the left arm that began approximately 1 hour ago while he was laying in bed watching TV.  No known aggravating or alleviating factors.  No cough, fever, chills, jaw pain, leg swelling, vomiting, diaphoresis, shortness of breath.  Reports that he was feeling at his baseline earlier today.  He is on peritoneal dialysis and is dialyzed at home every day.  However, he did not complete a session today because he was planning to do it around midnight since he had to take his daughter to work earlier in the night.  He is taking all of his nighttime medications and has been compliant with all of his antihypertensive medications.  Review of Systems  Positive: Chest pain Negative: Diaphoresis, shortness of breath, cough, fever, chills, leg swelling, jaw pain, numbness, weakness  Physical Exam  BP (!) 202/90 (BP Location: Right Arm)   Pulse 74   Temp 98.1 F (36.7 C) (Oral)   Resp (!) 28   Ht 5\' 10"  (1.778 m)   Wt 115 kg   SpO2 98%   BMI 36.38 kg/m  Gen:   Awake, no distress   Resp:  Normal effort  MSK:   Moves extremities without difficulty  Other:  Lungs are clear to auscultation bilaterally  Medical Decision Making  Medically screening exam initiated at 12:21 AM.  Appropriate orders placed.  [name] was informed that the remainder of the evaluation will be completed by another provider, this initial triage assessment does not replace that evaluation, and the importance of remaining in the ED until their evaluation is complete.  Labs and imaging have been ordered.  He will require further work-up and evaluation in the emergency department.   Joanne Gavel, PA-C 01/29/21 Allie Dimmer, April, MD 01/29/21 Shelah Lewandowsky

## 2021-01-29 NOTE — H&P (Addendum)
ER Consult Note   *Note changed to ER Consult Note after he was seen by cardiology in the ER and recommended for d/c.  Donald [name] XNA:[id number] DOB: 1970/07/06 DOA: 01/29/2021  PCP: Ladell Pier, MD Consultants:  Gardiner Rhyme - cardiology; nephrology; Schnier - vascular Patient coming from:  Home - lives with daughter, granddaughter, wife; NOK: Wife, [phone]  Chief Complaint: Chest pain  HPI: Donald [name] is a 50 y.o. male with medical history significant of DM; CAD s/p CABG in 03/2015; HTN; and ESRD on PD presenting with CP.  He reports that he was at home and developed small pain in his chest, which is not unusual.  However, this pain lasted a lot longer than it would usually last. He wasn't doing anything strenuous.  Nothing made it better or worse.  It resolved on its own and is no longer present.  He had a cath last in 05/2020 with severe disease, recommended for medical management.    ED Course: Carryover, per Dr. Marlowe Sax:  50 year old with ESRD on HD, CAD status post CABG, hypertension, diabetes, CHF presented with chest pain.  Troponin elevated but stable (104 >109).  EKG without acute changes.  Chest x-ray negative.  Patient was given aspirin, morphine, and Zofran.  Potassium slightly high and Lokelma given.   Review of Systems: As per HPI; otherwise review of systems reviewed and negative.   Ambulatory Status:  Ambulates without assistance  COVID Vaccine Status:  Complete  Past Medical History:  Diagnosis Date   Diabetes mellitus without complication ([place])    ESRD on peritoneal dialysis ([place])    dialysis    History of vitrectomy 2014   anterior, left   Hx of heart bypass surgery    Hypertension     Past Surgical History:  Procedure Laterality Date   AV FISTULA PLACEMENT     CARDIAC SURGERY     COLONOSCOPY WITH PROPOFOL N/A 02/11/2020   Procedure: COLONOSCOPY WITH PROPOFOL;  Surgeon: Toledo, Benay Pike, MD;  Location: [facility] ENDOSCOPY;  Service:  Gastroenterology;  Laterality: N/A;   CORONARY ARTERY BYPASS GRAFT  2016   ESOPHAGOGASTRODUODENOSCOPY (EGD) WITH PROPOFOL N/A 01/02/2020   Procedure: ESOPHAGOGASTRODUODENOSCOPY (EGD) WITH PROPOFOL;  Surgeon: Lesly Rubenstein, MD;  Location: [facility] ENDOSCOPY;  Service: Endoscopy;  Laterality: N/A;   INSERTION OF DIALYSIS CATHETER     left great toe amputation     LEFT HEART CATH AND CORS/GRAFTS ANGIOGRAPHY N/A 06/03/2020   Procedure: LEFT HEART CATH AND CORS/GRAFTS ANGIOGRAPHY;  Surgeon: Sherren Mocha, MD;  Location: [place] [facility];  Service: Cardiovascular;  Laterality: N/A;   VITRECTOMY Left 2014    Social History   Socioeconomic History   Marital status: Married    Spouse name: Not on file   Number of children: 6   Years of education: Not on file   Highest education level: Some college, no degree  Occupational History   Occupation: disabled  Tobacco Use   Smoking status: Never   Smokeless tobacco: Never  Vaping Use   Vaping Use: Never used  Substance and Sexual Activity   Alcohol use: Yes    Comment: occasionally   Drug use: Yes    Types: Marijuana    Comment: occasionally   Sexual activity: Not on file  Other Topics Concern   Not on file  Social History Narrative   Not on file   Social Determinants of Health   Financial Resource Strain: Low Risk    Difficulty of Paying Living Expenses:  Not very hard  Food Insecurity: No Food Insecurity   Worried About Charity fundraiser in the Last Year: Never true   Ran Out of Food in the Last Year: Never true  Transportation Needs: No Transportation Needs   Lack of Transportation (Medical): No   Lack of Transportation (Non-Medical): No  Physical Activity: Insufficiently Active   Days of Exercise per Week: 3 days   Minutes of Exercise per Session: 30 min  Stress: No Stress Concern Present   Feeling of Stress : Only a little  Social Connections: Unknown   Frequency of Communication with Friends and Family: Three times a  week   Frequency of Social Gatherings with Friends and Family: Three times a week   Attends Religious Services: Not on file   Active Member of Clubs or Organizations: Not on file   Attends Archivist Meetings: Not on file   Marital Status: Not on file  Intimate Partner Violence: Not At Risk   Fear of Current or Ex-Partner: No   Emotionally Abused: No   Physically Abused: No   Sexually Abused: No    Allergies  Allergen Reactions   Other Swelling    Other reaction(s): EGGPLANT Facial swelling   Lisinopril Hives    Family History  Problem Relation Age of Onset   Diabetes Mellitus II Mother    Heart disease Mother    Diabetes Mother    Hypertension Mother    Kidney failure Mother    Diabetes Maternal Grandmother    Hyperlipidemia Maternal Grandmother    Dementia Maternal Grandmother     Prior to Admission medications   Medication Sig Start Date End Date Taking? Authorizing Provider  acetaminophen-codeine (TYLENOL #3) 300-30 MG tablet Take 2 tablets by mouth every 8 (eight) hours as needed for moderate pain. 12/24/20  Yes Ladell Pier, MD  aspirin EC 81 MG EC tablet Take 1 tablet (81 mg total) by mouth daily. 07/17/19  Yes Nita Sells, MD  calcium acetate (PHOSLO) 667 MG capsule Take 667-2,001 mg by mouth See admin instructions. Take 3 capsules by mouth with each meal & take 1 capsule with snacks 06/30/20  Yes [provider]  carvedilol (COREG) 12.5 MG tablet Take 1 tablet (12.5 mg total) by mouth 2 (two) times daily with a meal. 09/09/20  Yes Donato Heinz, MD  clopidogrel (PLAVIX) 75 MG tablet Take 1 tablet (75 mg total) by mouth daily. 06/04/20  Yes Regalado, Belkys A, MD  Continuous Blood Gluc Sensor (FREESTYLE LIBRE SENSOR SYSTEM) MISC Use to check blood sugar at least 4 times daily. Change sensor Q 2 wks 06/04/20  Yes Ladell Pier, MD  gentamicin cream (GARAMYCIN) 0.1 % Apply 1 application topically every morning. Apply to exit site  every day   Yes [provider]  insulin aspart (NOVOLOG) 100 UNIT/ML FlexPen Inject 6 Units into the skin 3 (three) times daily with meals. 05/20/20  Yes Ladell Pier, MD  insulin glargine (LANTUS SOLOSTAR) 100 UNIT/ML Solostar Pen Inject 20 Units into the skin daily. Patient taking differently: Inject 20 Units into the skin every evening. 05/20/20  Yes Ladell Pier, MD  Insulin Pen Needle (TRUEPLUS PEN NEEDLES) 31G X 6 MM MISC Use to inject Novolog TID and Lantus once daily. Total injections in 1 day = 4. 09/13/20  Yes Ladell Pier, MD  isosorbide dinitrate (ISORDIL) 20 MG tablet Take 1 tablet (20 mg total) by mouth 2 (two) times daily. 01/05/21  Yes Gardiner Rhyme,  Doreatha Martin, MD  Peritoneal Dialysis Solutions (DIALYSIS SOLUTION 2.5% LOW-MG/LOW-CA) 394 MOSM/L SOLN dianeal solution As per nephro 07/16/19  Yes Samtani, Darylene Price, MD  rosuvastatin (CRESTOR) 40 MG tablet Take 40 mg by mouth in the morning. 12/19/19  Yes [provider]  senna (SENOKOT) 8.6 MG tablet Take 2 tablets (17.2 mg total) by mouth daily. Patient taking differently: Take 2 tablets by mouth daily as needed for constipation. 07/16/19  Yes Nita Sells, MD  Vitamin D, Ergocalciferol, (DRISDOL) 1.25 MG (50000 UNIT) CAPS capsule Take 50,000 Units by mouth every Thursday. 12/01/20  Yes [provider]    Physical Exam: Vitals:   01/29/21 0645 01/29/21 0700 01/29/21 0715 01/29/21 0900  BP: 131/76 132/86 125/73 136/64  Pulse: 67   68  Resp: 13 17 17 17   Temp:      TempSrc:      SpO2: 95%  95% 95%  Weight:      Height:         General:  Appears calm and comfortable and is in NAD Eyes:  PERRL, EOMI, normal lids, iris ENT:  grossly normal hearing, lips & tongue, mmm; appropriate dentition Neck:  no LAD, masses or thyromegaly Cardiovascular:  RRR, no m/r/g. No LE edema.  Respiratory:   CTA bilaterally with no wheezes/rales/rhonchi.  Normal respiratory effort. Abdomen:  soft, NT,  ND Skin:  no rash or induration seen on limited exam Musculoskeletal:  grossly normal tone BUE/BLE, good ROM, no bony abnormality; s/p L great toe amputation and appears well healed LE:  2+ distal pulses, no apparent ulcerations Psychiatric:  grossly normal mood and affect, speech fluent and appropriate, AOx3 Neurologic:  CN 2-12 grossly intact, moves all extremities in coordinated fashion    Radiological Exams on Admission: Independently reviewed - see discussion in A/P where applicable  DG Chest 2 View  Result Date: 01/29/2021 CLINICAL DATA:  Chest pain radiating down the back to the left arm. EXAM: CHEST - 2 VIEW COMPARISON:  July 29, 2020 FINDINGS: Multiple sternal wires are seen. Chronic appearing increased lung markings are noted. Mild stable areas of scarring and/or atelectasis are seen within the bilateral lung bases. There is no evidence of a pleural effusion or pneumothorax. The heart size and mediastinal contours are within normal limits. A radiopaque vascular stent is seen below the left clavicle. The visualized skeletal structures are unremarkable. IMPRESSION: Chronic appearing increased lung markings with mild, stable bibasilar scarring and/or atelectasis. Electronically Signed   By: Virgina Norfolk M.D.   On: 01/29/2021 00:50    EKG: Independently reviewed.  NSR with rate 75; nonspecific ST changes with no evidence of acute ischemia; NSCSLT   Labs on Admission: I have personally reviewed the available labs and imaging studies at the time of the admission.  Pertinent labs:   K+ 5.6 Glucose 128 BUN 84/Creatinine 18.46/GFR 3 Albumin 2.9 HS troponin 104, 109 WBC 5.1 Hgb 10.0 COVID/flu negative   Assessment/Plan Principal Problem:   Chest pain with high risk for cardiac etiology Active Problems:   Type 2 diabetes mellitus with peripheral neuropathy (HCC)   Essential hypertension   ESRD on peritoneal dialysis ([place])   HLD (hyperlipidemia)   Chest pain -Patient  with substernal chest pressure that came on last night and lasted longer than his usual CP, appears to have resolved spontaneously. -Last cath was in 05/2020 with severe native 3v CAD and patency of prior bypassed vessels -1/3 typical symptoms suggestive of noncardiac chest pain but in a very high risk patient -  CXR with chronic changes but not apparently acute ones  -Initial cardiac HS troponin elevated compared to 07/2020 but with negative delta -EKG not indicative of acute ischemia.   -HEART pathway score is 4, indicating that the patient has an elevated risk score and requires further evaluation. -Planned to place in observation status on telemetry to rule out ACS by overnight observation.  -Continue ASA, Plavix, Isordil -Cardiology consultation requested and recommended medication change and d/c to home -Echo ordered but not completed prior to d/c  ESRD -Patient on chronic peritoneal dialysis, missed session last night -Nephrology prn order set utilized -He does not appear to be volume overloaded or otherwise in need of acute HD -Nephrology (Dr. Jonnie Finner) notified of admission -Continue Phoslo -Resume outpatient PD  HTN -Continue Coreg  HLD -Continue Crestor -Lipids were checked on 09/09/20 (TC 152, HDL 43, LDL 82, TG 154) so will not repeat at this time -Suggest f/u in lipid clinic  DM -Last A1c was 6.4 -Continue Lantus  Chronic combined CHF -Echo in 05/2020 with EF 28-41%, grade 1 diastolic dysfunction, and wall motion abnormalities noted -He appears to be compensated at this time -Repeat echo ordered but not completed; consider performing this as an outpatient  Obesity -Body mass index is 36.38 kg/m..  -Weight loss should be encouraged -Outpatient PCP/bariatric medicine/bariatric surgery f/u encouraged      Note: This patient has been tested and is negative for the novel coronavirus COVID-19. He has been fully vaccinated against COVID-19.    Code Status:  Full -  confirmed with patient Family Communication: None present Disposition Plan:  The patient is from: home  Anticipated d/c is to: home  Consults called: Cardiology; Nephrology     Thank you for this interesting consult.  Based on cardiology recommendations, the patient will be discharged to home.   Karmen Bongo MD [facility]   How to contact the [facility] Attending or Consulting provider [place] or covering provider during after hours [place], for this patient?  Check the care team in [facility] and look for a) attending/consulting [facility] provider listed and b) the [facility] team listed Log into [email or web address] and use [place]'s universal password to access. If you do not have the password, please contact the hospital operator. Locate the [facility] provider you are looking for under [facility] and page to a number that you can be directly reached. If you still have difficulty reaching the provider, please page the [facility] (Director on Call) for the Hospitalists listed on amion for assistance.   01/29/2021, 10:08 AM

## 2021-01-29 NOTE — ED Notes (Signed)
Admitting MD at bedside.

## 2021-01-29 NOTE — ED Provider Notes (Signed)
[facility] EMERGENCY DEPARTMENT Provider Note   CSN: [id number] Arrival date & time: 01/28/21  2354     History Chief Complaint  Patient presents with   Chest Pain    Donald [name] is a 50 y.o. male.  Patient is a 50 year old male with past medical history of diabetes, coronary artery disease with CABG in 2016, end-stage renal disease on peritoneal dialysis, hypertension.  Patient presenting today with complaints of chest discomfort.  This started approximately an hour prior to presentation.  He was at rest watching television when he developed sharp pain to the left upper chest.  This pain radiated through into his back.  This lasted for approximately 2 hours, and is now improving.  Initially his pain was an 8 out of 10, but rates it now as a 2 out of 10.  This is without intervention.  He denies nausea, diaphoresis, or radiation to the arm or jaw.  He reports symptoms do feel similar to what he is experienced with his cardiac issues in the past.  Last heart cath was February 2022 and medical management was recommended and no stents placed.  The history is provided by the patient.  Chest Pain Pain location:  L chest Pain quality: sharp   Pain radiates to:  Does not radiate Pain severity:  Moderate Onset quality:  Sudden Duration:  2 hours Timing:  Constant Progression:  Improving Chronicity:  New     Past Medical History:  Diagnosis Date   Diabetes mellitus without complication ([place])    History of vitrectomy 2014   anterior, left   Hx of heart bypass surgery    Hypertension    Renal disorder    dialysis     Patient Active Problem List   Diagnosis Date Noted   Anemia due to chronic kidney disease, on chronic dialysis ([place]) 26/83/4196   Chronic systolic HF (heart failure) ([place]) 06/22/2020   Acute coronary syndrome with high troponin ([place]) 06/01/2020   Normocytic anemia 06/01/2020   Prolonged QT interval 22/29/7989   Complication of vascular  access for dialysis 05/31/2020   Age-related nuclear cataract of right eye 05/26/2020   Pseudophakia, left eye 05/26/2020   Right epiretinal membrane 05/26/2020   Controlled type 2 diabetes mellitus with stable proliferative retinopathy of both eyes, with long-term current use of insulin ([place]) 05/26/2020   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 ([place]) 05/20/2020   Primary insomnia 05/20/2020   Hyperlipidemia associated with type 2 diabetes mellitus ([place]) 05/20/2020   History of amputation of left great toe ([place]) 05/20/2020   Elevated troponin    GI bleed 01/01/2020   Hematemesis 12/31/2019   Type 2 diabetes mellitus with peripheral neuropathy ([place]) 12/31/2019   Essential hypertension    ESRD on peritoneal dialysis ([place])    HLD (hyperlipidemia)    Hypertensive urgency 07/14/2019   Chest pain     Past Surgical History:  Procedure Laterality Date   AV FISTULA PLACEMENT     CARDIAC SURGERY     COLONOSCOPY WITH PROPOFOL N/A 02/11/2020   Procedure: COLONOSCOPY WITH PROPOFOL;  Surgeon: Toledo, Benay Pike, MD;  Location: [facility] ENDOSCOPY;  Service: Gastroenterology;  Laterality: N/A;   CORONARY ARTERY BYPASS GRAFT  2016   ESOPHAGOGASTRODUODENOSCOPY (EGD) WITH PROPOFOL N/A 01/02/2020   Procedure: ESOPHAGOGASTRODUODENOSCOPY (EGD) WITH PROPOFOL;  Surgeon: Lesly Rubenstein, MD;  Location: [facility] ENDOSCOPY;  Service: Endoscopy;  Laterality: N/A;   INSERTION OF DIALYSIS CATHETER  left great toe amputation     LEFT HEART CATH AND CORS/GRAFTS ANGIOGRAPHY N/A 06/03/2020   Procedure: LEFT HEART CATH AND CORS/GRAFTS ANGIOGRAPHY;  Surgeon: Sherren Mocha, MD;  Location: [place];  Service: Cardiovascular;  Laterality: N/A;   VITRECTOMY Left 2014       Family History  Problem Relation Age of Onset   Diabetes Mellitus II Mother    Heart disease Mother    Diabetes Mother    Hypertension Mother    Kidney failure Mother    Diabetes Maternal  Grandmother    Hyperlipidemia Maternal Grandmother    Dementia Maternal Grandmother     Social History   Tobacco Use   Smoking status: Never   Smokeless tobacco: Never  Vaping Use   Vaping Use: Never used  Substance Use Topics   Alcohol use: Yes    Comment: occasionally   Drug use: Yes    Types: Marijuana    Comment: occasionally    Home Medications Prior to Admission medications   Medication Sig Start Date End Date Taking? Authorizing Provider  acetaminophen-codeine (TYLENOL #3) 300-30 MG tablet Take 2 tablets by mouth every 8 (eight) hours as needed for moderate pain. 12/24/20   Ladell Pier, MD  aspirin EC 81 MG EC tablet Take 1 tablet (81 mg total) by mouth daily. 07/17/19   Nita Sells, MD  calcium acetate (PHOSLO) 667 MG capsule Take 667-2,001 mg by mouth See admin instructions. Take 3 capsules by mouth with each meal & take 1 capsule with snacks 06/30/20   [provider]  carvedilol (COREG) 12.5 MG tablet Take 1 tablet (12.5 mg total) by mouth 2 (two) times daily with a meal. 09/09/20   Donato Heinz, MD  cinacalcet (SENSIPAR) 30 MG tablet Take by mouth daily. 07/16/19   [provider]  clopidogrel (PLAVIX) 75 MG tablet Take 1 tablet (75 mg total) by mouth daily. 06/04/20   Regalado, Belkys A, MD  Continuous Blood Gluc Sensor (FREESTYLE LIBRE SENSOR SYSTEM) MISC Use to check blood sugar at least 4 times daily. Change sensor Q 2 wks 06/04/20   Ladell Pier, MD  gentamicin cream (GARAMYCIN) 0.1 % Apply 1 application topically every morning. Apply to exit site every day    [provider]  insulin aspart (NOVOLOG) 100 UNIT/ML FlexPen Inject 6 Units into the skin 3 (three) times daily with meals. 05/20/20   Ladell Pier, MD  insulin glargine (LANTUS SOLOSTAR) 100 UNIT/ML Solostar Pen Inject 20 Units into the skin daily. Patient taking differently: Inject 20 Units into the skin every evening. 05/20/20   Ladell Pier, MD   Insulin Pen Needle (TRUEPLUS PEN NEEDLES) 31G X 6 MM MISC Use to inject Novolog TID and Lantus once daily. Total injections in 1 day = 4. 09/13/20   Ladell Pier, MD  isosorbide dinitrate (ISORDIL) 20 MG tablet Take 1 tablet (20 mg total) by mouth 2 (two) times daily. 01/05/21   Donato Heinz, MD  Peritoneal Dialysis Solutions (DIALYSIS SOLUTION 2.5% LOW-MG/LOW-CA) 394 MOSM/L SOLN dianeal solution As per nephro 07/16/19   Nita Sells, MD  rosuvastatin (CRESTOR) 40 MG tablet Take 40 mg by mouth in the morning. 12/19/19   [provider]  senna (SENOKOT) 8.6 MG tablet Take 2 tablets (17.2 mg total) by mouth daily. Patient taking differently: Take 2 tablets by mouth daily as needed for constipation. 07/16/19   Nita Sells, MD    Allergies    Other and  Lisinopril  Review of Systems   Review of Systems  Cardiovascular:  Positive for chest pain.  All other systems reviewed and are negative.  Physical Exam Updated Vital Signs BP 116/60 (BP Location: Right Arm)   Pulse 71   Temp 98.7 F (37.1 C) (Oral)   Resp 20   Ht 5\' 10"  (1.778 m)   Wt 115 kg   SpO2 98%   BMI 36.38 kg/m   Physical Exam Vitals and nursing note reviewed.  Constitutional:      General: He is not in acute distress.    Appearance: He is well-developed. He is not diaphoretic.  HENT:     Head: Normocephalic and atraumatic.  Cardiovascular:     Rate and Rhythm: Normal rate and regular rhythm.     Heart sounds: No murmur heard.   No friction rub.  Pulmonary:     Effort: Pulmonary effort is normal. No respiratory distress.     Breath sounds: Normal breath sounds. No wheezing or rales.  Abdominal:     General: Bowel sounds are normal. There is no distension.     Palpations: Abdomen is soft.     Tenderness: There is no abdominal tenderness.  Musculoskeletal:        General: Normal range of motion.     Cervical back: Normal range of motion and neck supple.     Right lower leg: No  tenderness. No edema.     Left lower leg: No tenderness. No edema.  Skin:    General: Skin is warm and dry.  Neurological:     Mental Status: He is alert and oriented to person, place, and time.     Coordination: Coordination normal.    ED Results / Procedures / Treatments   Labs (all labs ordered are listed, but only abnormal results are displayed) Labs Reviewed  CBC - Abnormal; Notable for the following components:      Result Value   RBC 3.25 (*)    Hemoglobin 10.0 (*)    HCT 30.8 (*)    All other components within normal limits  COMPREHENSIVE METABOLIC PANEL - Abnormal; Notable for the following components:   Sodium 132 (*)    Potassium 5.6 (*)    Chloride 93 (*)    Glucose, Bld 128 (*)    BUN 84 (*)    Creatinine, Ser 18.46 (*)    Total Protein 6.2 (*)    Albumin 2.9 (*)    GFR, Estimated 3 (*)    All other components within normal limits  I-STAT CHEM 8, ED - Abnormal; Notable for the following components:   Sodium 132 (*)    Potassium 5.3 (*)    Chloride 96 (*)    BUN 83 (*)    Creatinine, Ser >18.00 (*)    Glucose, Bld 121 (*)    Calcium, Ion 1.11 (*)    Hemoglobin 10.5 (*)    HCT 31.0 (*)    All other components within normal limits  TROPONIN I (HIGH SENSITIVITY) - Abnormal; Notable for the following components:   Troponin I (High Sensitivity) 104 (*)    All other components within normal limits  TROPONIN I (HIGH SENSITIVITY)    EKG EKG Interpretation  Date/Time:  Saturday January 29 2021 00:09:36 EDT Ventricular Rate:  75 PR Interval:  172 QRS Duration: 98 QT Interval:  398 QTC Calculation: 444 R Axis:   68 Text Interpretation: Normal sinus rhythm with sinus arrhythmia ST & T wave abnormality, consider inferolateral  ischemia Abnormal ECG No significant change since 07/29/2020 Confirmed by Veryl Speak [phone]) on 01/29/2021 12:13:30 AM  Radiology DG Chest 2 View  Result Date: 01/29/2021 CLINICAL DATA:  Chest pain radiating down the back to the  left arm. EXAM: CHEST - 2 VIEW COMPARISON:  July 29, 2020 FINDINGS: Multiple sternal wires are seen. Chronic appearing increased lung markings are noted. Mild stable areas of scarring and/or atelectasis are seen within the bilateral lung bases. There is no evidence of a pleural effusion or pneumothorax. The heart size and mediastinal contours are within normal limits. A radiopaque vascular stent is seen below the left clavicle. The visualized skeletal structures are unremarkable. IMPRESSION: Chronic appearing increased lung markings with mild, stable bibasilar scarring and/or atelectasis. Electronically Signed   By: Virgina Norfolk M.D.   On: 01/29/2021 00:50    Procedures Procedures   Medications Ordered in ED Medications  morphine 4 MG/ML injection 4 mg (has no administration in time range)  ondansetron (ZOFRAN) injection 4 mg (has no administration in time range)    ED Course  I have reviewed the triage vital signs and the nursing notes.  Pertinent labs & imaging results that were available during my care of the patient were reviewed by me and considered in my medical decision making (see chart for details).    MDM Rules/Calculators/A&P  Patient is a 50 year old male with history as per HPI presenting with complaints of chest pain.  This feels similar to what he experienced with his prior cardiac issues.  Work-up thus far is unremarkable.  His EKG is unchanged and troponin x2 was negative.  Given the patient's complex cardiac and renal history, I feel as though admission for further observation is indicated.  I have spoken with the hospitalist who agrees to admit.  Final Clinical Impression(s) / ED Diagnoses Final diagnoses:  None    Rx / DC Orders ED Discharge Orders     None        Veryl Speak, MD 01/29/21 [phone]

## 2021-01-29 NOTE — Consult Note (Addendum)
Cardiology Consultation:   Patient ID: Donald [name] MRN: [id number]; DOB: Dec 07, 1970  Admit date: 01/29/2021 Date of Consult: 01/29/2021  PCP:  Ladell Pier, MD   [facility] Providers Cardiologist:  Donato Heinz, MD        Patient Profile:   Donald [name] is a 50 y.o. male with a hx of coronary artery disease status post CABG in 2016, HFrEF, ESRD on peritoneal dialysis, diabetes, hypertension, hyperlipidemia, s/p left great toe amputation who is being seen 01/29/2021 for the evaluation of chest pain at the request of Dr. Lorin Mercy.  History of Present Illness:   Donald [name] underwent CABG in [place] in 2016.  He underwent cardiac catheterization in February 2022 in setting of non-STEMI.  This demonstrated severe native three-vessel CAD, patent LIMA-LAD and patent SVG-OM1.  The SVG-right PDA was patent with total occlusion of the PDA and PLA branches beyond the graft insertion.  Medical therapy was recommended.  He has undergone evaluation at [facility] for renal transplant.  He had a cardiac PET/CT myocardial perfusion study in March 2022 which demonstrated inferior and inferolateral ischemia.  Stress echocardiogram in July 2022 was abnormal with moderate LV dysfunction (EF 35).  Stress test was indeterminate due to abnormal resting study and failure to meet target heart rate.  There was no evidence of ischemia at submaximal heart rate.  He was last seen by Dr. Gardiner Rhyme in September.  He was noted to have frequent ectopy and PVCs during cardiac rehab.  There were also short runs of NSVT.  3-day Zio patch monitor results are currently pending.  He typically has exertional chest discomfort.  He is on long-acting nitrates.  He has not had any escalation in symptoms recently.  Last night around 10 PM, he developed left-sided chest discomfort which radiated to his left scapular area and his left arm.  He does not have nitroglycerin to take.  He had symptoms for about 2 hours.  He had  no associated shortness of breath, nausea, syncope.  He typically gets to his dry weight with peritoneal dialysis.  He has not had any significant issues with orthopnea or leg edema.  He is currently pain-free.  Labs/data in the emergency room: K+ 5.3 Serum creatinine greater than 18 ALT 20 ALB 2.9, total protein 6.2 Hgb 10.5 High sensitivity troponin 109>> 109 CXR: Chronic appearing increased lung markings with mild, stable bibasilar scarring and/or atelectasis EKG personally reviewed: NSR, HR 75, normal axis, downsloping inferolateral T wave inversions, QTC 444, similar to prior tracing  Past Medical History:  Diagnosis Date   CAD (coronary artery disease)    S/p CABG in 2016 in [place] //cath 2/22: Patent grafts; PDA/PLA branch is occluded beyond graft insertion; severe diffuse disease in native AV circumflex supplies small amount of myocardium-if refractory angina, PCI could be considered>> adequate therapy for now   Diabetes mellitus without complication ([place])    ESRD on peritoneal dialysis ([place])    dialysis    HFrEF (heart failure with reduced ejection fraction) ([place])    EF 35-40   History of vitrectomy 2014   anterior, left   Hx of heart bypass surgery    Hyperlipidemia    Hypertension     Past Surgical History:  Procedure Laterality Date   AV FISTULA PLACEMENT     CARDIAC SURGERY     COLONOSCOPY WITH PROPOFOL N/A 02/11/2020   Procedure: COLONOSCOPY WITH PROPOFOL;  Surgeon: Toledo, Benay Pike, MD;  Location: [facility] ENDOSCOPY;  Service: Gastroenterology;  Laterality:  N/A;   CORONARY ARTERY BYPASS GRAFT  2016   ESOPHAGOGASTRODUODENOSCOPY (EGD) WITH PROPOFOL N/A 01/02/2020   Procedure: ESOPHAGOGASTRODUODENOSCOPY (EGD) WITH PROPOFOL;  Surgeon: Lesly Rubenstein, MD;  Location: [facility] ENDOSCOPY;  Service: Endoscopy;  Laterality: N/A;   INSERTION OF DIALYSIS CATHETER     left great toe amputation     LEFT HEART CATH AND CORS/GRAFTS ANGIOGRAPHY N/A 06/03/2020   Procedure: LEFT  HEART CATH AND CORS/GRAFTS ANGIOGRAPHY;  Surgeon: Sherren Mocha, MD;  Location: [facility];  Service: Cardiovascular;  Laterality: N/A;   VITRECTOMY Left 2014     Home Medications:  Prior to Admission medications   Medication Sig Start Date End Date Taking? Authorizing Provider  acetaminophen-codeine (TYLENOL #3) 300-30 MG tablet Take 2 tablets by mouth every 8 (eight) hours as needed for moderate pain. 12/24/20  Yes Ladell Pier, MD  aspirin EC 81 MG EC tablet Take 1 tablet (81 mg total) by mouth daily. 07/17/19  Yes Nita Sells, MD  calcium acetate (PHOSLO) 667 MG capsule Take 667-2,001 mg by mouth See admin instructions. Take 3 capsules by mouth with each meal & take 1 capsule with snacks 06/30/20  Yes [provider]  carvedilol (COREG) 12.5 MG tablet Take 1 tablet (12.5 mg total) by mouth 2 (two) times daily with a meal. 09/09/20  Yes Donato Heinz, MD  clopidogrel (PLAVIX) 75 MG tablet Take 1 tablet (75 mg total) by mouth daily. 06/04/20  Yes Regalado, Belkys A, MD  Continuous Blood Gluc Sensor (FREESTYLE LIBRE SENSOR SYSTEM) MISC Use to check blood sugar at least 4 times daily. Change sensor Q 2 wks 06/04/20  Yes Ladell Pier, MD  gentamicin cream (GARAMYCIN) 0.1 % Apply 1 application topically every morning. Apply to exit site every day   Yes [provider]  insulin aspart (NOVOLOG) 100 UNIT/ML FlexPen Inject 6 Units into the skin 3 (three) times daily with meals. 05/20/20  Yes Ladell Pier, MD  insulin glargine (LANTUS SOLOSTAR) 100 UNIT/ML Solostar Pen Inject 20 Units into the skin daily. Patient taking differently: Inject 20 Units into the skin every evening. 05/20/20  Yes Ladell Pier, MD  Insulin Pen Needle (TRUEPLUS PEN NEEDLES) 31G X 6 MM MISC Use to inject Novolog TID and Lantus once daily. Total injections in 1 day = 4. 09/13/20  Yes Ladell Pier, MD  isosorbide dinitrate (ISORDIL) 20 MG tablet Take 1 tablet (20 mg  total) by mouth 2 (two) times daily. 01/05/21  Yes Donato Heinz, MD  Peritoneal Dialysis Solutions (DIALYSIS SOLUTION 2.5% LOW-MG/LOW-CA) 394 MOSM/L SOLN dianeal solution As per nephro 07/16/19  Yes Nita Sells, MD  rosuvastatin (CRESTOR) 40 MG tablet Take 40 mg by mouth in the morning. 12/19/19  Yes [provider]  senna (SENOKOT) 8.6 MG tablet Take 2 tablets (17.2 mg total) by mouth daily. Patient taking differently: Take 2 tablets by mouth daily as needed for constipation. 07/16/19  Yes Nita Sells, MD  Vitamin D, Ergocalciferol, (DRISDOL) 1.25 MG (50000 UNIT) CAPS capsule Take 50,000 Units by mouth every Thursday. 12/01/20  Yes [provider]    Inpatient Medications: Scheduled Meds:  aspirin EC  81 mg Oral Daily   calcium acetate  2,001 mg Oral TID WC   calcium acetate  667 mg Oral With snacks   carvedilol  12.5 mg Oral BID WC   clopidogrel  75 mg Oral Daily   gentamicin cream  1 application Topical q morning   heparin  5,000  Units Subcutaneous Q8H   insulin aspart  0-6 Units Subcutaneous TID WC   insulin glargine-yfgn  20 Units Subcutaneous QPM   isosorbide dinitrate  30 mg Oral BID   rosuvastatin  40 mg Oral q AM   Continuous Infusions:  dialysis solution 2.5% low-MG/low-CA     PRN Meds: acetaminophen **OR** acetaminophen, acetaminophen-codeine, calcium carbonate (dosed in mg elemental calcium), camphor-menthol **AND** hydrOXYzine, docusate sodium, feeding supplement (NEPRO CARB STEADY), heparin, nitroGLYCERIN, senna, sorbitol, zolpidem  Allergies:    Allergies  Allergen Reactions   Other Swelling    Other reaction(s): EGGPLANT Facial swelling   Lisinopril Hives    Social History:   Social History   Socioeconomic History   Marital status: Married    Spouse name: Not on file   Number of children: 6   Years of education: Not on file   Highest education level: Some college, no degree  Occupational History   Occupation:  disabled  Tobacco Use   Smoking status: Never   Smokeless tobacco: Never  Vaping Use   Vaping Use: Never used  Substance and Sexual Activity   Alcohol use: Yes    Comment: occasionally   Drug use: Yes    Types: Marijuana    Comment: occasionally   Sexual activity: Not on file  Other Topics Concern   Not on file  Social History Narrative   Not on file   Social Determinants of Health   Financial Resource Strain: Low Risk    Difficulty of Paying Living Expenses: Not very hard  Food Insecurity: No Food Insecurity   Worried About Charity fundraiser in the Last Year: Never true   Ran Out of Food in the Last Year: Never true  Transportation Needs: No Transportation Needs   Lack of Transportation (Medical): No   Lack of Transportation (Non-Medical): No  Physical Activity: Insufficiently Active   Days of Exercise per Week: 3 days   Minutes of Exercise per Session: 30 min  Stress: No Stress Concern Present   Feeling of Stress : Only a little  Social Connections: Unknown   Frequency of Communication with Friends and Family: Three times a week   Frequency of Social Gatherings with Friends and Family: Three times a week   Attends Religious Services: Not on file   Active Member of Clubs or Organizations: Not on file   Attends Archivist Meetings: Not on file   Marital Status: Not on file  Intimate Partner Violence: Not At Risk   Fear of Current or Ex-Partner: No   Emotionally Abused: No   Physically Abused: No   Sexually Abused: No    Family History:    Family History  Problem Relation Age of Onset   Diabetes Mellitus II Mother    Heart disease Mother    Diabetes Mother    Hypertension Mother    Kidney failure Mother    Diabetes Maternal Grandmother    Hyperlipidemia Maternal Grandmother    Dementia Maternal Grandmother      ROS:  Please see the history of present illness.  He has not had any recent fevers, chills, cough, melena, hematochezia. All other ROS  reviewed and negative.     Physical Exam/Data:   Vitals:   01/29/21 0900 01/29/21 1000 01/29/21 1100 01/29/21 1300  BP: 136/64 (!) 147/68 (!) 144/73 (!) 150/80  Pulse: 68 69  71  Resp: 17 20 14 18   Temp:      TempSrc:      SpO2:  95% 96%  98%  Weight:      Height:        Intake/Output Summary (Last 24 hours) at 01/29/2021 1431 Last data filed at 01/29/2021 1240 Gross per 24 hour  Intake 240 ml  Output --  Net 240 ml   Last 3 Weights 01/29/2021 01/28/2021 01/05/2021  Weight (lbs) 253 lb 8.5 oz 235 lb 10.8 oz 229 lb 12.8 oz  Weight (kg) 115 kg 106.9 kg 104.237 kg     Body mass index is 36.38 kg/m.  General:  Well nourished, well developed, in no acute distress HEENT: normal Neck: + JVD Vascular: Distal pulses 2+ bilaterally Cardiac:  normal S1, S2; RRR; 2/6 systolic murmur RUSB Lungs:  clear to auscultation bilaterally, no wheezing, rhonchi or rales  Abd: soft, nontender Ext: no edema Musculoskeletal: Left great toe amputated  skin: warm and dry  Neuro:  CNs 2-12 intact, no focal abnormalities noted Psych:  Normal affect   EKG:  The EKG was personally reviewed and demonstrates: See HPI Telemetry:  Telemetry was personally reviewed and demonstrates: NSR  Relevant CV Studies: Echocardiogram from [facility] 10/18/2020 INTERPRETATION ---------------------------------------------------------------    MODERATE LV DYSFUNCTION (See above) WITH MODERATE LVH    ELEVATED LA PRESSURES WITH DIASTOLIC DYSFUNCTION    MILD RV SYSTOLIC DYSFUNCTION (See above)    VALVULAR REGURGITATION: TRIVIAL AR, MILD MR, MILD PR, TRIVIAL TR    NO VALVULAR STENOSIS    NO PRIOR STUDY FOR COMPARISON    3D acquisition and reconstructions were performed as part of this    examination to more accurately quantify the effects of reduced left    ventricular ejection fraction. (post-processing on an Independent    workstation).   LEFT HEART CATH 06/03/2020 Narrative 1.  Severe native three-vessel coronary  artery disease with patency of the left mainstem, moderate diffuse proximal and mid LAD stenosis, severe ostial left circumflex stenosis, and total occlusion of the RCA. 2.  Continued patency of the LIMA to LAD graft and saphenous vein graft to OM1 with no disease in either bypass graft 3.  Patent SVG to right PDA with total occlusion of the PDA and PLA branches beyond the graft insertion site, some collateral filling of the PLA branches identified (suspect culprit lesion) 4.  Moderate segmental LV systolic dysfunction based on echo assessment with akinesis of the inferior wall and LVEF 35 to 40%  Recommendation: Favor medical therapy.  Suspect interval occlusion just distal to the SVG to PDA graft.  The AV circumflex has severe diffuse disease that would require extensive stenting and supplies a relatively small amount of myocardium.  If refractory angina, PCI of this vessel could be considered. Add clopidogrel 75 mg daily for medical therapy of NSTEMI-ACS.    Laboratory Data:  High Sensitivity Troponin:   Recent Labs  Lab 01/29/21 0021 01/29/21 0202  TROPONINIHS 104* 109*     Chemistry Recent Labs  Lab 01/29/21 0021 01/29/21 0031  NA 132* 132*  K 5.6* 5.3*  CL 93* 96*  CO2 25  --   GLUCOSE 128* 121*  BUN 84* 83*  CREATININE 18.46* >18.00*  CALCIUM 9.5  --   GFRNONAA 3*  --   ANIONGAP 14  --     Recent Labs  Lab 01/29/21 0021  PROT 6.2*  ALBUMIN 2.9*  AST 16  ALT 20  ALKPHOS 117  BILITOT 0.9   Lipids No results for input(s): CHOL, TRIG, HDL, LABVLDL, LDLCALC, CHOLHDL in the last 168 hours.  Hematology Recent Labs  Lab 01/29/21 0021 01/29/21 0031  WBC 5.1  --   RBC 3.25*  --   HGB 10.0* 10.5*  HCT 30.8* 31.0*  MCV 94.8  --   MCH 30.8  --   MCHC 32.5  --   RDW 14.7  --   PLT 210  --    Radiology/Studies:  DG Chest 2 View  Result Date: 01/29/2021 CLINICAL DATA:  Chest pain radiating down the back to the left arm. EXAM: CHEST - 2 VIEW COMPARISON:  July 29, 2020 FINDINGS: Multiple sternal wires are seen. Chronic appearing increased lung markings are noted. Mild stable areas of scarring and/or atelectasis are seen within the bilateral lung bases. There is no evidence of a pleural effusion or pneumothorax. The heart size and mediastinal contours are within normal limits. A radiopaque vascular stent is seen below the left clavicle. The visualized skeletal structures are unremarkable. IMPRESSION: Chronic appearing increased lung markings with mild, stable bibasilar scarring and/or atelectasis. Electronically Signed   By: Virgina Norfolk M.D.   On: 01/29/2021 00:50     Assessment and Plan:   Coronary artery disease with angina History of CABG in 2016.  Cardiac catheterization in February 2022 demonstrated patent grafts, an occluded PDA/PLA beyond the graft insertion and severe diffuse disease in the AV circumflex.  The circumflex supplies a small amount of myocardium.  Medical therapy has been continued.  PCI of the LCx could be considered if the patient has refractory angina.  He has had work-up at [facility] for renal transplant.  PET/CT in March did demonstrate inferior and inferolateral ischemia.  Stress echocardiogram in July was inconclusive due to submaximal heart rate.  His EF on baseline study was still 35%.  He presents the emergency room today with left-sided chest discomfort similar to his previous angina.  This occurred at rest.  He had 2 hours of chest discomfort.  His high-sensitivity troponin elevation is mild and there was no clear trend.  This is not consistent with ACS.  Given the patient's recent cardiac catheterization and extensive work-up, we do not think he needs further evaluation at this time.  He is currently pain-free.  We will try to increase his antianginal regimen by adjusting his isosorbide dinitrate to 30 mg twice daily.  He will also need a prescription for as needed nitroglycerin on discharge.  Continue carvedilol, rosuvastatin,  aspirin, clopidogrel.  We will arrange follow-up in our clinic in the next 3 to 4 weeks.  (HFrEF) heart failure with reduced ejection fraction  EF 35-40.  He is not on ACE/ARB/ARNI/SGLT2i secondary to ESRD.  He also has a true allergy to ACE inhibitor's.  Continue carvedilol, isosorbide dinitrate.  Blood pressure has limited GDMT and he is not currently on hydralazine.  If his blood pressure will tolerate the future, this could be considered.  Hypertension He does have some low blood pressures at times.  Hopefully, his blood pressure will tolerate the increase in isosorbide dinitrate.  Continue current dose of carvedilol.  Hyperlipidemia Continue current dose of rosuvastatin.  ESRD He remains on home, daily peritoneal dialysis.  PVCs He underwent a recent monitor in our office for evaluation.  Results are still pending.  He has not had syncope.  Risk Assessment/Risk Scores:     HEAR Score (for undifferentiated chest pain):  HEAR Score: 5  New York Heart Association (NYHA) Functional Class NYHA Class II    [facility] will sign off.   Medication Recommendations: Increase isosorbide dinitrate to 30 mg twice  daily.  Arrange prescription for as needed nitroglycerin. Other recommendations (labs, testing, etc):  n/a Follow up as an outpatient: Follow-up with Dr. Gardiner Rhyme or APP in 3 to 4 weeks  For questions or updates, please contact [place] Please consult [email or web address] for contact info under    Signed, Richardson Dopp, PA-C  01/29/2021 2:31 PM  Personally seen and examined. Agree with above.  50 year old end-stage renal disease patient with prior CABG in 2016 here with symptoms of chest pain that occurred 10 PM last night lasted about 2 hours.  Now chest pain-free.  Here with his wife.  Denies any fevers chills nausea vomiting shortness of breath.  Echocardiogram EF most recently at [facility] was 30 to 35%.  Cardiac PET demonstrated EF of 40% with inferolateral  ischemia.  Coronary catheterization February 2022 showed fairly diffuse coronary artery disease.  Carefully reviewed, Dr. Antionette Char note states fairly extensive disease in the circumflex system.  Would require atherectomy, potentially several stents if attempted.  On exam, heart regular rate and rhythm lungs are clear.  No significant edema.  Troponin was flat at approximately 100.  Previous troponin values have been in the 800 range, 70s to 80s.  Assessment and plan: Chest discomfort -After review of data, most recent testing in the past several months, flat low-level troponin, and currently chest pain-free, I feel comfortable letting him go home with slight increase in his isosorbide dinitrate from 20 mg twice a day up to 30 mg twice a day.  He and his wife were amenable to this plan.  Thankful.  I do not think any further cardiac testing at this time is necessary.  CAD post CABG - Catheterization reviewed as above.  As Dr. Burt Knack stated, if antianginals are not sufficient at controlling anginal type pain, we could always entertain again the possibility of extensive PCI to circumflex system.  At this time, lets continue with aggressive medical management.  Continue with high intensity statin.  Please let us know if we can be of further assistance. We will arrange close follow-up.  Okay for discharge.  Candee Furbish, MD

## 2021-01-29 NOTE — ED Triage Notes (Signed)
Patient reports left chest pain radiating to left arm and upper back this evening , no emesis or diaphoresis , did not  perform his peritoneal dialysis this evening , no cough or fever .

## 2021-01-30 DIAGNOSIS — Z992 Dependence on renal dialysis: Secondary | ICD-10-CM | POA: Diagnosis not present

## 2021-01-30 DIAGNOSIS — N186 End stage renal disease: Secondary | ICD-10-CM | POA: Diagnosis not present

## 2021-01-31 ENCOUNTER — Ambulatory Visit (HOSPITAL_COMMUNITY): Payer: Medicare Other

## 2021-01-31 DIAGNOSIS — N186 End stage renal disease: Secondary | ICD-10-CM | POA: Diagnosis not present

## 2021-01-31 DIAGNOSIS — Z992 Dependence on renal dialysis: Secondary | ICD-10-CM | POA: Diagnosis not present

## 2021-02-01 DIAGNOSIS — N186 End stage renal disease: Secondary | ICD-10-CM | POA: Diagnosis not present

## 2021-02-01 DIAGNOSIS — Z992 Dependence on renal dialysis: Secondary | ICD-10-CM | POA: Diagnosis not present

## 2021-02-02 ENCOUNTER — Encounter (HOSPITAL_COMMUNITY)
Admission: RE | Admit: 2021-02-02 | Discharge: 2021-02-02 | Disposition: A | Discharge status: Home / Self Care | Payer: Medicare Other | Source: Ambulatory Visit | Attending: Cardiology | Admitting: Cardiology

## 2021-02-02 ENCOUNTER — Inpatient Hospital Stay (HOSPITAL_COMMUNITY)
Admission: RE | Admit: 2021-02-02 | Discharge: 2021-02-02 | Disposition: A | Discharge status: Home / Self Care | Payer: Medicare Other | Source: Ambulatory Visit

## 2021-02-02 ENCOUNTER — Other Ambulatory Visit: Payer: Self-pay

## 2021-02-02 DIAGNOSIS — I214 Non-ST elevation (NSTEMI) myocardial infarction: Secondary | ICD-10-CM | POA: Diagnosis not present

## 2021-02-02 DIAGNOSIS — Z992 Dependence on renal dialysis: Secondary | ICD-10-CM | POA: Diagnosis not present

## 2021-02-02 DIAGNOSIS — N186 End stage renal disease: Secondary | ICD-10-CM | POA: Diagnosis not present

## 2021-02-03 DIAGNOSIS — Z992 Dependence on renal dialysis: Secondary | ICD-10-CM | POA: Diagnosis not present

## 2021-02-03 DIAGNOSIS — N186 End stage renal disease: Secondary | ICD-10-CM | POA: Diagnosis not present

## 2021-02-04 ENCOUNTER — Ambulatory Visit (HOSPITAL_COMMUNITY): Payer: Medicare Other

## 2021-02-04 DIAGNOSIS — N186 End stage renal disease: Secondary | ICD-10-CM | POA: Diagnosis not present

## 2021-02-04 DIAGNOSIS — Z992 Dependence on renal dialysis: Secondary | ICD-10-CM | POA: Diagnosis not present

## 2021-02-04 NOTE — Progress Notes (Signed)
Discharge Progress Report  Patient Details  Name: Donald [name] MRN: [id number] Date of Birth: March 09, 1971 Referring Provider:   Flowsheet Row CARDIAC REHAB PHASE II ORIENTATION from 11/30/2020 in [place]  Referring Provider Oswaldo Milian, MD        Number of Visits: 15  Reason for Discharge:  Patient reached a stable level of exercise. Patient independent in their exercise. Patient has met program and personal goals.  Smoking History:  Social History   Tobacco Use  Smoking Status Never  Smokeless Tobacco Never    Diagnosis:  11/30/20 NSTEMI   ADL UCSD:   Initial Exercise Prescription:  Initial Exercise Prescription - 11/30/20 1530       Date of Initial Exercise RX and Referring Provider   Date 11/30/20    Referring Provider Oswaldo Milian, MD    Expected Discharge Date 01/28/21      NuStep   Level 2    SPM 75    Minutes 25    METs 2      Prescription Details   Frequency (times per week) 3    Duration Progress to 30 minutes of continuous aerobic without signs/symptoms of physical distress      Intensity   THRR 40-80% of Max Heartrate 69-138    Ratings of Perceived Exertion 11-13    Perceived Dyspnea 0-4      Progression   Progression Continue progressive overload as per policy without signs/symptoms or physical distress.      Resistance Training   Training Prescription Yes    Weight 4 lbs    Reps 10-15             Discharge Exercise Prescription (Final Exercise Prescription Changes):  Exercise Prescription Changes - 02/02/21 1500       Response to Exercise   Blood Pressure (Admit) 120/60    Blood Pressure (Exercise) 112/70    Blood Pressure (Exit) 142/70    Heart Rate (Admit) 71 bpm    Heart Rate (Exercise) 74 bpm    Heart Rate (Exit) 70 bpm    Rating of Perceived Exertion (Exercise) 12    Comments Pt's last day of exericse in the CRP2 program    Duration Continue with 30 min of aerobic  exercise without signs/symptoms of physical distress.    Intensity THRR unchanged      Progression   Progression Continue to progress workloads to maintain intensity without signs/symptoms of physical distress.    Average METs 1.9      Resistance Training   Training Prescription No    Weight No weights on Wednesdays      Interval Training   Interval Training No      NuStep   Level 4    SPM 85    Minutes 30    METs 1.9      Home Exercise Plan   Plans to continue exercise at Home (comment)    Frequency Add 2 additional days to program exercise sessions.    Initial Home Exercises Provided 01/21/21             Functional Capacity:  6 Minute Walk     Row Name 11/30/20 1459 01/28/21 1330       6 Minute Walk   Phase Initial Discharge    Distance 1200 feet 1302 feet    Distance % Change -- 8.5 %    Distance Feet Change -- 102 ft    Walk Time 0 minutes 6 minutes    #  of Rest Breaks 2  5:09-5:27 and 5:50-6:00 0    MPH 2.3 2.47    METS 3.57 3.75    RPE 13 11    Perceived Dyspnea  2 1    VO2 Peak 12.51 13.13    Symptoms Yes (comment) No    Comments SOB, RPD = 2 SOB, RPD = 1    Resting HR 91 bpm 75 bpm    Resting BP 130/80 144/70    Resting Oxygen Saturation  94 % 94 %    Exercise Oxygen Saturation  during 6 min walk 89 % 93 %    Max Ex. HR 99 bpm 92 bpm    Max Ex. BP 140/70 150/80    2 Minute Post BP 146/80  4 min post BP: 142/66 --             Psychological, QOL, Others - Outcomes: PHQ 2/9: Depression screen [facility] 2/9 12/01/2020 09/13/2020 08/23/2020 05/20/2020  Decreased Interest 0 1 1 1   Down, Depressed, Hopeless 0 0 0 1  PHQ - 2 Score 0 1 1 2   Altered sleeping - - - 3  Tired, decreased energy - - - 2  Change in appetite - - - 1  Feeling bad or failure about yourself  - - - 1  Trouble concentrating - - - 0  Moving slowly or fidgety/restless - - - 0  PHQ-9 Score - - - 9    Quality of Life:  Quality of Life - 11/30/20 1602       Quality of Life    Select Quality of Life      Quality of Life Scores   Health/Function Pre 9 %    Socioeconomic Pre 15.67 %    Psych/Spiritual Pre 12.36 %    Family Pre 22.5 %    GLOBAL Pre 13.09 %             Personal Goals: Goals established at orientation with interventions provided to work toward goal.  Personal Goals and Risk Factors at Admission - 11/30/20 1604       Core Components/Risk Factors/Patient Goals on Admission    Weight Management Yes;Obesity;Weight Loss    Intervention Weight Management: Develop a combined nutrition and exercise program designed to reach desired caloric intake, while maintaining appropriate intake of nutrient and fiber, sodium and fats, and appropriate energy expenditure required for the weight goal.;Weight Management: Provide education and appropriate resources to help participant work on and attain dietary goals.;Weight Management/Obesity: Establish reasonable short term and long term weight goals.;Obesity: Provide education and appropriate resources to help participant work on and attain dietary goals.    Admit Weight 236 lb 5.3 oz (107.2 kg)    Expected Outcomes Short Term: Continue to assess and modify interventions until short term weight is achieved;Long Term: Adherence to nutrition and physical activity/exercise program aimed toward attainment of established weight goal;Weight Maintenance: Understanding of the daily nutrition guidelines, which includes 25-35% calories from fat, 7% or less cal from saturated fats, less than 227m cholesterol, less than 1.5gm of sodium, & 5 or more servings of fruits and vegetables daily;Weight Loss: Understanding of general recommendations for a balanced deficit meal plan, which promotes 1-2 lb weight loss per week and includes a negative energy balance of [phone] kcal/d;Understanding recommendations for meals to include 15-35% energy as protein, 25-35% energy from fat, 35-60% energy from carbohydrates, less than 20102mof dietary  cholesterol, 20-35 gm of total fiber daily;Understanding of distribution of calorie intake throughout the day  with the consumption of 4-5 meals/snacks    Diabetes Yes    Intervention Provide education about signs/symptoms and action to take for hypo/hyperglycemia.;Provide education about proper nutrition, including hydration, and aerobic/resistive exercise prescription along with prescribed medications to achieve blood glucose in normal ranges: Fasting glucose 65-99 mg/dL    Expected Outcomes Short Term: Participant verbalizes understanding of the signs/symptoms and immediate care of hyper/hypoglycemia, proper foot care and importance of medication, aerobic/resistive exercise and nutrition plan for blood glucose control.;Long Term: Attainment of HbA1C < 7%.    Heart Failure Yes    Intervention Provide a combined exercise and nutrition program that is supplemented with education, support and counseling about heart failure. Directed toward relieving symptoms such as shortness of breath, decreased exercise tolerance, and extremity edema.    Expected Outcomes Short term: Attendance in program 2-3 days a week with increased exercise capacity. Reported lower sodium intake. Reported increased fruit and vegetable intake. Reports medication compliance.;Improve functional capacity of life;Short term: Daily weights obtained and reported for increase. Utilizing diuretic protocols set by physician.;Long term: Adoption of self-care skills and reduction of barriers for early signs and symptoms recognition and intervention leading to self-care maintenance.    Hypertension Yes    Intervention Provide education on lifestyle modifcations including regular physical activity/exercise, weight management, moderate sodium restriction and increased consumption of fresh fruit, vegetables, and low fat dairy, alcohol moderation, and smoking cessation.;Monitor prescription use compliance.    Expected Outcomes Short Term: Continued  assessment and intervention until BP is < 140/18m HG in hypertensive participants. < 130/856mHG in hypertensive participants with diabetes, heart failure or chronic kidney disease.;Long Term: Maintenance of blood pressure at goal levels.    Lipids Yes    Intervention Provide education and support for participant on nutrition & aerobic/resistive exercise along with prescribed medications to achieve LDL <7081mHDL >54m67m  Expected Outcomes Short Term: Participant states understanding of desired cholesterol values and is compliant with medications prescribed. Participant is following exercise prescription and nutrition guidelines.;Long Term: Cholesterol controlled with medications as prescribed, with individualized exercise RX and with personalized nutrition plan. Value goals: LDL < 70mg53mL > 40 mg.    Stress Yes    Intervention Offer individual and/or small group education and counseling on adjustment to heart disease, stress management and health-related lifestyle change. Teach and support self-help strategies.;Refer participants experiencing significant psychosocial distress to appropriate mental health specialists for further evaluation and treatment. When possible, include family members and significant others in education/counseling sessions.    Expected Outcomes Short Term: Participant demonstrates changes in health-related behavior, relaxation and other stress management skills, ability to obtain effective social support, and compliance with psychotropic medications if prescribed.;Long Term: Emotional wellbeing is indicated by absence of clinically significant psychosocial distress or social isolation.              Personal Goals Discharge:  Goals and Risk Factor Review     Row Name 12/21/20 1526 01/14/21 1746 02/23/21 1435         Core Components/Risk Factors/Patient Goals Review   Personal Goals Review Weight Management/Obesity;Stress;Hypertension;Lipids;Diabetes Weight  Management/Obesity;Stress;Hypertension;Lipids;Diabetes Weight Management/Obesity;Stress;Hypertension;Lipids;Diabetes     Review KennaRaybeen doing well with exercise. Kaelum's systlic Bp's have had some moderate elevations. Johnatha's weight was up on 12/20/20. Patient was given a copy of [facility]. Vital sings  and CBG'shave been variable. Weights vary due to peritoneal dialysis.  Timathy has been doing well with exercise. Vital sings  and CBG'shave been variable. Weights vary due to peritoneal dialysis. Kinney completed cardiac rehab on 02/02/21.     Expected Outcomes Bishoy will continue to participate in phase 2 cardiac rehab for exercise, nutrition and lifestyle modifications. Quintavius will continue to participate in phase 2 cardiac rehab for exercise, nutrition and lifestyle modifications. Vicente hopefully will continue to  exercise, nutrition and lifestyle modifications upon completion of phase 2 cardiac rehab.              Exercise Goals and Review:  Exercise Goals     Row Name 11/30/20 1500             Exercise Goals   Increase Physical Activity Yes       Intervention Provide advice, education, support and counseling about physical activity/exercise needs.;Develop an individualized exercise prescription for aerobic and resistive training based on initial evaluation findings, risk stratification, comorbidities and participant's personal goals.       Expected Outcomes Long Term: Add in home exercise to make exercise part of routine and to increase amount of physical activity.;Long Term: Exercising regularly at least 3-5 days a week.;Short Term: Attend rehab on a regular basis to increase amount of physical activity.       Increase Strength and Stamina Yes       Intervention Provide advice, education, support and counseling about physical activity/exercise needs.;Develop an individualized  exercise prescription for aerobic and resistive training based on initial evaluation findings, risk stratification, comorbidities and participant's personal goals.       Expected Outcomes Short Term: Increase workloads from initial exercise prescription for resistance, speed, and METs.;Short Term: Perform resistance training exercises routinely during rehab and add in resistance training at home;Long Term: Improve cardiorespiratory fitness, muscular endurance and strength as measured by increased METs and functional capacity (6MWT)       Able to understand and use rate of perceived exertion (RPE) scale Yes       Intervention Provide education and explanation on how to use RPE scale       Expected Outcomes Long Term:  Able to use RPE to guide intensity level when exercising independently;Short Term: Able to use RPE daily in rehab to express subjective intensity level       Knowledge and understanding of Target Heart Rate Range (THRR) Yes       Intervention Provide education and explanation of THRR including how the numbers were predicted and where they are located for reference       Expected Outcomes Short Term: Able to state/look up THRR;Short Term: Able to use daily as guideline for intensity in rehab;Long Term: Able to use THRR to govern intensity when exercising independently       Understanding of Exercise Prescription Yes       Intervention Provide education, explanation, and written materials on patient's individual exercise prescription       Expected Outcomes Short Term: Able to explain program exercise prescription;Long Term: Able to explain home exercise prescription to exercise independently                Exercise Goals Re-Evaluation:  Exercise Goals Re-Evaluation     Row Name 12/06/20 1500 12/07/20 0747 12/29/20 1500 01/21/21 1532       Exercise Goal Re-Evaluation   Exercise Goals Review Increase Physical Activity;Increase Strength and Stamina;Able to understand and use rate of  perceived exertion (RPE) scale;Knowledge and understanding of Target Heart Rate Range (THRR);Understanding of  Exercise Prescription -- Increase Physical Activity;Increase Strength and Stamina;Able to understand and use rate of perceived exertion (RPE) scale;Knowledge and understanding of Target Heart Rate Range (THRR);Able to check pulse independently;Understanding of Exercise Prescription Increase Physical Activity;Increase Strength and Stamina;Able to understand and use rate of perceived exertion (RPE) scale;Knowledge and understanding of Target Heart Rate Range (THRR);Able to check pulse independently;Understanding of Exercise Prescription    Comments Pt's first day in the CRP2 program. Pt understands the exercise Rx, THRR and RPE scale. -- Reviewed METs and goals. Pt voices he feels like his walking has improved and that he has more endurance. Also voiced now walking up the stairs into his home and not have to use the rail to pull himself up. Reviewed home exercise Rx. Encouraged patient to incorporate walking 2/days per week in addtion to the CRP2 program. 30 minutes encouraged.    Expected Outcomes Will continue to montor patient and progress exercise workloads as tolerated. -- Will continue to montor patient and progress exercise workloads as tolerated. Pt will try to begin walking at home 2 x/week.             Nutrition & Weight - Outcomes:  Pre Biometrics - 11/30/20 1415       Pre Biometrics   Waist Circumference 48 inches    Hip Circumference 46.5 inches    Waist to Hip Ratio 1.03 %    Triceps Skinfold 15 mm    % Body Fat 32.8 %    Grip Strength 25 kg    Flexibility 13.75 in    Single Leg Stand 3 seconds             Post Biometrics - 01/28/21 1531        Post  Biometrics   Height 5' 10"  (1.778 m)    Weight 106.9 kg    Waist Circumference 48 inches    Hip Circumference 46 inches    Waist to Hip Ratio 1.04 %    BMI (Calculated) 33.82    Triceps Skinfold 15 mm    % Body  Fat 32.7 %    Grip Strength 35 kg    Flexibility 12.75 in    Single Leg Stand 3 seconds             Nutrition:  Nutrition Therapy & Goals - 12/15/20 1442       Nutrition Therapy   Diet TLC    Drug/Food Interactions Statins/Certain Fruits      Personal Nutrition Goals   Nutrition Goal Pt to build a healthy plate including vegetables, fruits, whole grains, and low-fat dairy products in a heart healthy meal plan.    Personal Goal #2 Pt to eat a variety of non-starchy vegetables.    Personal Goal #3 Pt to eat more beans and peas      Intervention Plan   Intervention Nutrition handout(s) given to patient.;Prescribe, educate and counsel regarding individualized specific dietary modifications aiming towards targeted core components such as weight, hypertension, lipid management, diabetes, heart failure and other comorbidities.    Expected Outcomes Long Term Goal: Adherence to prescribed nutrition plan.;Short Term Goal: Understand basic principles of dietary content, such as calories, fat, sodium, cholesterol and nutrients.             Nutrition Discharge:   Education Questionnaire Score:  Knowledge Questionnaire Score - 11/30/20 1602       Knowledge Questionnaire Score   Pre Score 20/24  Pt graduated from cardiac rehab program on 02/02/21 with completion of 15 exercise sessions in Phase II. Pt maintained fair attendance and progressed nicely during his participation in rehab as evidenced by increased MET level. Did not get to review Kenan's medications or PHQ as he did not return to the last day of exercise on 02/04/21 . Hopefully Teoman will continue to exercise at home.Barnet Pall, RN,BSN 02/23/2021 2:51 PM

## 2021-02-05 DIAGNOSIS — Z992 Dependence on renal dialysis: Secondary | ICD-10-CM | POA: Diagnosis not present

## 2021-02-05 DIAGNOSIS — N186 End stage renal disease: Secondary | ICD-10-CM | POA: Diagnosis not present

## 2021-02-06 DIAGNOSIS — Z992 Dependence on renal dialysis: Secondary | ICD-10-CM | POA: Diagnosis not present

## 2021-02-06 DIAGNOSIS — N186 End stage renal disease: Secondary | ICD-10-CM | POA: Diagnosis not present

## 2021-02-07 DIAGNOSIS — N186 End stage renal disease: Secondary | ICD-10-CM | POA: Diagnosis not present

## 2021-02-07 DIAGNOSIS — Z992 Dependence on renal dialysis: Secondary | ICD-10-CM | POA: Diagnosis not present

## 2021-02-08 DIAGNOSIS — N186 End stage renal disease: Secondary | ICD-10-CM | POA: Diagnosis not present

## 2021-02-08 DIAGNOSIS — Z992 Dependence on renal dialysis: Secondary | ICD-10-CM | POA: Diagnosis not present

## 2021-02-09 DIAGNOSIS — Z992 Dependence on renal dialysis: Secondary | ICD-10-CM | POA: Diagnosis not present

## 2021-02-09 DIAGNOSIS — N186 End stage renal disease: Secondary | ICD-10-CM | POA: Diagnosis not present

## 2021-02-10 DIAGNOSIS — D509 Iron deficiency anemia, unspecified: Secondary | ICD-10-CM | POA: Diagnosis not present

## 2021-02-10 DIAGNOSIS — Z992 Dependence on renal dialysis: Secondary | ICD-10-CM | POA: Diagnosis not present

## 2021-02-10 DIAGNOSIS — N186 End stage renal disease: Secondary | ICD-10-CM | POA: Diagnosis not present

## 2021-02-11 DIAGNOSIS — N186 End stage renal disease: Secondary | ICD-10-CM | POA: Diagnosis not present

## 2021-02-11 DIAGNOSIS — Z992 Dependence on renal dialysis: Secondary | ICD-10-CM | POA: Diagnosis not present

## 2021-02-12 DIAGNOSIS — Z992 Dependence on renal dialysis: Secondary | ICD-10-CM | POA: Diagnosis not present

## 2021-02-12 DIAGNOSIS — N186 End stage renal disease: Secondary | ICD-10-CM | POA: Diagnosis not present

## 2021-02-13 DIAGNOSIS — Z992 Dependence on renal dialysis: Secondary | ICD-10-CM | POA: Diagnosis not present

## 2021-02-13 DIAGNOSIS — N186 End stage renal disease: Secondary | ICD-10-CM | POA: Diagnosis not present

## 2021-02-14 DIAGNOSIS — N186 End stage renal disease: Secondary | ICD-10-CM | POA: Diagnosis not present

## 2021-02-14 DIAGNOSIS — Z992 Dependence on renal dialysis: Secondary | ICD-10-CM | POA: Diagnosis not present

## 2021-02-15 DIAGNOSIS — Z992 Dependence on renal dialysis: Secondary | ICD-10-CM | POA: Diagnosis not present

## 2021-02-15 DIAGNOSIS — N186 End stage renal disease: Secondary | ICD-10-CM | POA: Diagnosis not present

## 2021-02-16 DIAGNOSIS — Z992 Dependence on renal dialysis: Secondary | ICD-10-CM | POA: Diagnosis not present

## 2021-02-16 DIAGNOSIS — N186 End stage renal disease: Secondary | ICD-10-CM | POA: Diagnosis not present

## 2021-02-17 DIAGNOSIS — N186 End stage renal disease: Secondary | ICD-10-CM | POA: Diagnosis not present

## 2021-02-17 DIAGNOSIS — Z992 Dependence on renal dialysis: Secondary | ICD-10-CM | POA: Diagnosis not present

## 2021-02-18 DIAGNOSIS — Z992 Dependence on renal dialysis: Secondary | ICD-10-CM | POA: Diagnosis not present

## 2021-02-18 DIAGNOSIS — N186 End stage renal disease: Secondary | ICD-10-CM | POA: Diagnosis not present

## 2021-02-19 DIAGNOSIS — N186 End stage renal disease: Secondary | ICD-10-CM | POA: Diagnosis not present

## 2021-02-19 DIAGNOSIS — Z992 Dependence on renal dialysis: Secondary | ICD-10-CM | POA: Diagnosis not present

## 2021-02-20 DIAGNOSIS — Z992 Dependence on renal dialysis: Secondary | ICD-10-CM | POA: Diagnosis not present

## 2021-02-20 DIAGNOSIS — N186 End stage renal disease: Secondary | ICD-10-CM | POA: Diagnosis not present

## 2021-02-20 NOTE — Progress Notes (Signed)
Cardiology Office Note:    Date:  02/25/2021   ID:  Donald [name], DOB 1971/02/04, MRN [id number]  PCP:  Ladell Pier, MD  Cardiologist:  Donato Heinz, MD   Referring MD: Ladell Pier, MD   Chief Complaint  Patient presents with   Hospitalization Follow-up    History of Present Illness:    Donald [name] is a 50 y.o. male with a hx of CAD status post CABG (2016 in [place]: LIMA-LAD, SVG-OM1, SVG-RPDA), ESRD on peritoneal dialysis, DM 2, left toe amputation, history of GI bleeding, hypertension, hyperlipidemia, chronic systolic heart failure with an EF of 35 to 40%.  He was admitted in February 2022 with NSTEMI.  Echo showed an LVEF of 35 to 40% with inferior hypokinesis, grade 1 diastolic dysfunction, and moderate RV dysfunction.  Heart cath on 06/03/2020 showed severe native three-vessel disease with patent LIMA-LAD and SVG-OM1, patent SVG to RPDA but total occlusion of PDA and PLA branches beyond the graft insertion site.  Medical therapy was recommended.  He was seen by Dr. Gardiner Rhyme in clinic on 12/28/2020 and was doing well at that time.  Sleep study was advised for snoring.  He was participating in cardiac rehab.  He has undergone evaluation at [facility] for renal transplant.  This included cardiac PET/CT myocardial perfusion study in March 2022 which demonstrated inferior and inferolateral ischemia.  Stress echocardiogram in July 2022 was abnormal with moderate LV dysfunction, EF 35%.  Stress test was indeterminate due to abnormal resting study and failure to meet target heart rate.  There was no evidence of ischemia at submaximal heart rate.  He was noted to have frequent ectopy and PVCs during cardiac rehab with short runs of NSVT.  A 3-day Zio patch monitor was ordered.  He has known exertional chest pain and is on Imdur.  He was seen in the ER for chest pain on 01/29/2021 and admitted.  Troponins were low and flat, inconsistent with ACS.  Given his recent heart  catheterization and work-up at [facility], no further work-up was recommended.  He was discharged with close follow-up and Imdur was increased to 30 mg twice daily. He was continued on ASA and plavix.   He presents today for hospital follow-up. He believe his arm discomfort is due to issues with his AV fistula (vessel narrowing) and he is scheduled to see [facility]. He has not cardiac complaints today. He was unable to make his sleep study appt and would like to reschedule. He is not taking plavix and is unsure why.    Past Medical History:  Diagnosis Date   CAD (coronary artery disease)    S/p CABG in 2016 in [place] //cath 2/22: Patent grafts; PDA/PLA branch is occluded beyond graft insertion; severe diffuse disease in native AV circumflex supplies small amount of myocardium-if refractory angina, PCI could be considered>> adequate therapy for now   Diabetes mellitus without complication ([place])    ESRD on peritoneal dialysis ([place])    dialysis    HFrEF (heart failure with reduced ejection fraction) ([place])    EF 35-40   History of vitrectomy 2014   anterior, left   Hx of heart bypass surgery    Hyperlipidemia    Hypertension     Past Surgical History:  Procedure Laterality Date   AV FISTULA PLACEMENT     CARDIAC SURGERY     COLONOSCOPY WITH PROPOFOL N/A 02/11/2020   Procedure: COLONOSCOPY WITH PROPOFOL;  Surgeon: [name], Benay Pike, MD;  Location: [facility]  ENDOSCOPY;  Service: Gastroenterology;  Laterality: N/A;   CORONARY ARTERY BYPASS GRAFT  2016   ESOPHAGOGASTRODUODENOSCOPY (EGD) WITH PROPOFOL N/A 01/02/2020   Procedure: ESOPHAGOGASTRODUODENOSCOPY (EGD) WITH PROPOFOL;  Surgeon: Lesly Rubenstein, MD;  Location: [facility] ENDOSCOPY;  Service: Endoscopy;  Laterality: N/A;   INSERTION OF DIALYSIS CATHETER     left great toe amputation     LEFT HEART CATH AND CORS/GRAFTS ANGIOGRAPHY N/A 06/03/2020   Procedure: LEFT HEART CATH AND CORS/GRAFTS ANGIOGRAPHY;  Surgeon: Sherren Mocha, MD;  Location: [facility];  Service: Cardiovascular;  Laterality: N/A;   VITRECTOMY Left 2014    Current Medications: Current Meds  Medication Sig   acetaminophen-codeine (TYLENOL #3) 300-30 MG tablet Take 2 tablets by mouth every 8 (eight) hours as needed for moderate pain.   aspirin EC 81 MG EC tablet Take 1 tablet (81 mg total) by mouth daily.   calcium acetate (PHOSLO) 667 MG capsule Take 667-2,001 mg by mouth See admin instructions. Take 3 capsules by mouth with each meal & take 1 capsule with snacks   carvedilol (COREG) 12.5 MG tablet Take 1 tablet (12.5 mg total) by mouth 2 (two) times daily with a meal.   Continuous Blood Gluc Sensor (FREESTYLE LIBRE SENSOR SYSTEM) MISC Use to check blood sugar at least 4 times daily. Change sensor Q 2 wks   ezetimibe (ZETIA) 10 MG tablet Take 1 tablet (10 mg total) by mouth daily.   gentamicin cream (GARAMYCIN) 0.1 % Apply 1 application topically every morning. Apply to exit site every day   insulin aspart (NOVOLOG) 100 UNIT/ML FlexPen Inject 6 Units into the skin 3 (three) times daily with meals.   insulin glargine (LANTUS SOLOSTAR) 100 UNIT/ML Solostar Pen Inject 20 Units into the skin daily. (Patient taking differently: Inject 20 Units into the skin every evening.)   Insulin Pen Needle (TRUEPLUS PEN NEEDLES) 31G X 6 MM MISC Use to inject Novolog TID and Lantus once daily. Total injections in 1 day = 4.   isosorbide dinitrate (ISORDIL) 30 MG tablet Take 1 tablet (30 mg total) by mouth 2 (two) times daily.   nitroGLYCERIN (NITROSTAT) 0.4 MG SL tablet Place 1 tablet (0.4 mg total) under the tongue every 5 (five) minutes as needed for chest pain.   Peritoneal Dialysis Solutions (DIALYSIS SOLUTION 2.5% LOW-MG/LOW-CA) 394 MOSM/L SOLN dianeal solution As per nephro   rosuvastatin (CRESTOR) 40 MG tablet Take 40 mg by mouth in the morning.   senna (SENOKOT) 8.6 MG tablet Take 2 tablets (17.2 mg total) by mouth daily. (Patient taking differently: Take 2 tablets by mouth  daily as needed for constipation.)   Vitamin D, Ergocalciferol, (DRISDOL) 1.25 MG (50000 UNIT) CAPS capsule Take 50,000 Units by mouth every Thursday.   [DISCONTINUED] clopidogrel (PLAVIX) 75 MG tablet Take 1 tablet (75 mg total) by mouth daily.     Allergies:   Other and Lisinopril   Social History   Socioeconomic History   Marital status: Married    Spouse name: Not on file   Number of children: 6   Years of education: Not on file   Highest education level: Some college, no degree  Occupational History   Occupation: disabled  Tobacco Use   Smoking status: Never   Smokeless tobacco: Never  Vaping Use   Vaping Use: Never used  Substance and Sexual Activity   Alcohol use: Yes    Comment: occasionally   Drug use: Yes    Types: Marijuana    Comment: occasionally  Sexual activity: Not on file  Other Topics Concern   Not on file  Social History Narrative   Not on file   Social Determinants of Health   Financial Resource Strain: Low Risk    Difficulty of Paying Living Expenses: Not very hard  Food Insecurity: No Food Insecurity   Worried About Running Out of Food in the Last Year: Never true   Ran Out of Food in the Last Year: Never true  Transportation Needs: No Transportation Needs   Lack of Transportation (Medical): No   Lack of Transportation (Non-Medical): No  Physical Activity: Insufficiently Active   Days of Exercise per Week: 3 days   Minutes of Exercise per Session: 30 min  Stress: No Stress Concern Present   Feeling of Stress : Only a little  Social Connections: Unknown   Frequency of Communication with Friends and Family: Three times a week   Frequency of Social Gatherings with Friends and Family: Three times a week   Attends Religious Services: Not on file   Active Member of Clubs or Organizations: Not on file   Attends Archivist Meetings: Not on file   Marital Status: Not on file     Family History: The patient's family history includes  Dementia in his maternal grandmother; Diabetes in his maternal grandmother and mother; Diabetes Mellitus II in his mother; Heart disease in his mother; Hyperlipidemia in his maternal grandmother; Hypertension in his mother; Kidney failure in his mother.  ROS:   Please see the history of present illness.     All other systems reviewed and are negative.  EKGs/Labs/Other Studies Reviewed:    The following studies were reviewed today:  Left heart cath 06/03/20: 1.  Severe native three-vessel coronary artery disease with patency of the left mainstem, moderate diffuse proximal and mid LAD stenosis, severe ostial left circumflex stenosis, and total occlusion of the RCA. 2.  Continued patency of the LIMA to LAD graft and saphenous vein graft to OM1 with no disease in either bypass graft 3.  Patent SVG to right PDA with total occlusion of the PDA and PLA branches beyond the graft insertion site, some collateral filling of the PLA branches identified (suspect culprit lesion) 4.  Moderate segmental LV systolic dysfunction based on echo assessment with akinesis of the inferior wall and LVEF 35 to 40%   Recommendation: Favor medical therapy.  Suspect interval occlusion just distal to the SVG to PDA graft.  The AV circumflex has severe diffuse disease that would require extensive stenting and supplies a relatively small amount of myocardium.  If refractory angina, PCI of this vessel could be considered. Add clopidogrel 75 mg daily for medical therapy of NSTEMI-ACS.     EKG:  EKG is not ordered today.    Recent Labs: 12/31/2020: Magnesium 2.3 01/29/2021: ALT 20; BUN 83; Creatinine, Ser >18.00; Hemoglobin 10.5; Platelets 210; Potassium 5.3; Sodium 132  Recent Lipid Panel    Component Value Date/Time   CHOL 152 09/09/2020 1137   TRIG 154 (H) 09/09/2020 1137   HDL 43 09/09/2020 1137   CHOLHDL 3.5 09/09/2020 1137   CHOLHDL 3.6 07/14/2019 2209   VLDL 25 07/14/2019 2209   LDLCALC 82 09/09/2020 1137     Physical Exam:    VS:  BP (!) 148/82   Pulse 86   Ht 5\' 10"  (1.778 m)   Wt 242 lb (109.8 kg)   SpO2 94%   BMI 34.72 kg/m     Wt Readings from Last 3  Encounters:  02/25/21 242 lb (109.8 kg)  01/29/21 253 lb 8.5 oz (115 kg)  01/28/21 235 lb 10.8 oz (106.9 kg)     GEN:  Well nourished, well developed in no acute distress HEENT: Normal NECK: No JVD; No carotid bruits LYMPHATICS: No lymphadenopathy CARDIAC: irregular rhythm, regular rate, no murmur RESPIRATORY:  Clear to auscultation without rales, wheezing or rhonchi  ABDOMEN: Soft, non-tender, non-distended MUSCULOSKELETAL:  No edema; No deformity  SKIN: Warm and dry NEUROLOGIC:  Alert and oriented x 3 PSYCHIATRIC:  Normal affect   ASSESSMENT:    1. PVC's (premature ventricular contractions)   2. NSVT (nonsustained ventricular tachycardia)   3. Coronary artery disease involving coronary bypass graft of native heart without angina pectoris   4. Hx of CABG   5. Hyperlipidemia, unspecified hyperlipidemia type   6. Chronic systolic HF (heart failure) ([place])   7. Ischemic cardiomyopathy   8. Essential hypertension   9. Snoring    PLAN:    In order of problems listed above:  PVCs, NSVT Noted during cardiac rehab 3-day Zio patch has not been completed - may have not turned it on correctly. Company is sending a new one and he will call customer service to make sure its turned on. - ectopy noted on exam   CAD status post CABG - Heart cath in February 2022 with 3 out of 3 patent grafts - Continue aspirin, Plavix, Coreg 12.5 mg twice daily, and 30 mg isordil BID - he has not been taking plavix, unclear when/why this was stopped - I will restart plavix today given extensive disease   Hyperlipidemia with LDL goal less than 70 09/09/2020: Cholesterol, Total 152; HDL 43; LDL Chol Calc (NIH) 82; Triglycerides 154 Continue 40 mg crestor. He is not at goal given extensive disease. I will start 10 mg zetia.    Chronic  systolic heart failure Hypertension Goal-directed medical therapy is limited by his ESRD and blood pressure.  He also has a true allergy to ACE inhibitors.  Continue carvedilol, isordil.  BP today is elevated in the 140s. I discussed starting hydralazine and he stated he did not tolerate this previously due to dizziness and low BP. I will ask him to keep a BP log and call us if consistently above 884 systolic. May need to further uptitrate his isordil vs bidil.    Snoring - has not been contacted, will ask Erasmo Score. to reach back out - he was unable to make initial appt   Follow up with Dr. Gardiner Rhyme in 6 months.    Medication Adjustments/Labs and Tests Ordered: Current medicines are reviewed at length with the patient today.  Concerns regarding medicines are outlined above.  No orders of the defined types were placed in this encounter.  Meds ordered this encounter  Medications   ezetimibe (ZETIA) 10 MG tablet    Sig: Take 1 tablet (10 mg total) by mouth daily.    Dispense:  90 tablet    Refill:  3   clopidogrel (PLAVIX) 75 MG tablet    Sig: Take 1 tablet (75 mg total) by mouth daily.    Dispense:  90 tablet    Refill:  3    Signed, Ledora Bottcher, [place]  02/25/2021 9:42 AM    [facility]

## 2021-02-21 DIAGNOSIS — Z992 Dependence on renal dialysis: Secondary | ICD-10-CM | POA: Diagnosis not present

## 2021-02-21 DIAGNOSIS — N186 End stage renal disease: Secondary | ICD-10-CM | POA: Diagnosis not present

## 2021-02-22 DIAGNOSIS — N186 End stage renal disease: Secondary | ICD-10-CM | POA: Diagnosis not present

## 2021-02-22 DIAGNOSIS — Z992 Dependence on renal dialysis: Secondary | ICD-10-CM | POA: Diagnosis not present

## 2021-02-23 ENCOUNTER — Telehealth: Payer: Self-pay | Admitting: *Deleted

## 2021-02-23 DIAGNOSIS — N186 End stage renal disease: Secondary | ICD-10-CM | POA: Diagnosis not present

## 2021-02-23 DIAGNOSIS — Z992 Dependence on renal dialysis: Secondary | ICD-10-CM | POA: Diagnosis not present

## 2021-02-23 NOTE — Addendum Note (Signed)
Encounter addended by: Magda Kiel, RN on: 02/23/2021 2:52 PM  Actions taken: Flowsheet data copied forward, Flowsheet accepted, Clinical Note Signed, Episode resolved

## 2021-02-23 NOTE — Telephone Encounter (Signed)
[employer] had received a ZIO patch monitor back, however, there was no data on the monitor.  This would happen if the monitor malfunctioned or was not turned on when applied. Patient agreed to have another 3 day ZIO XT monitor mailed to his home.  Reviewed procedure to turn the monitor on.

## 2021-02-24 DIAGNOSIS — Z992 Dependence on renal dialysis: Secondary | ICD-10-CM | POA: Diagnosis not present

## 2021-02-24 DIAGNOSIS — N186 End stage renal disease: Secondary | ICD-10-CM | POA: Diagnosis not present

## 2021-02-25 ENCOUNTER — Ambulatory Visit (INDEPENDENT_AMBULATORY_CARE_PROVIDER_SITE_OTHER): Payer: Medicare Other | Admitting: Physician Assistant

## 2021-02-25 ENCOUNTER — Other Ambulatory Visit: Payer: Self-pay

## 2021-02-25 ENCOUNTER — Encounter: Payer: Self-pay | Admitting: Physician Assistant

## 2021-02-25 VITALS — BP 148/82 | HR 86 | Ht 70.0 in | Wt 242.0 lb

## 2021-02-25 DIAGNOSIS — I4729 Other ventricular tachycardia: Secondary | ICD-10-CM

## 2021-02-25 DIAGNOSIS — I25810 Atherosclerosis of coronary artery bypass graft(s) without angina pectoris: Secondary | ICD-10-CM

## 2021-02-25 DIAGNOSIS — N186 End stage renal disease: Secondary | ICD-10-CM | POA: Diagnosis not present

## 2021-02-25 DIAGNOSIS — Z992 Dependence on renal dialysis: Secondary | ICD-10-CM | POA: Diagnosis not present

## 2021-02-25 DIAGNOSIS — R0683 Snoring: Secondary | ICD-10-CM

## 2021-02-25 DIAGNOSIS — Z951 Presence of aortocoronary bypass graft: Secondary | ICD-10-CM | POA: Diagnosis not present

## 2021-02-25 DIAGNOSIS — E785 Hyperlipidemia, unspecified: Secondary | ICD-10-CM

## 2021-02-25 DIAGNOSIS — I10 Essential (primary) hypertension: Secondary | ICD-10-CM | POA: Diagnosis not present

## 2021-02-25 DIAGNOSIS — I255 Ischemic cardiomyopathy: Secondary | ICD-10-CM

## 2021-02-25 DIAGNOSIS — I493 Ventricular premature depolarization: Secondary | ICD-10-CM | POA: Diagnosis not present

## 2021-02-25 DIAGNOSIS — I5022 Chronic systolic (congestive) heart failure: Secondary | ICD-10-CM | POA: Diagnosis not present

## 2021-02-25 MED ORDER — EZETIMIBE 10 MG PO TABS: 10.0000 mg | ORAL_TABLET | Freq: Every day | ORAL | 3 refills | Status: DC

## 2021-02-25 MED ORDER — CLOPIDOGREL BISULFATE 75 MG PO TABS: 75.0000 mg | ORAL_TABLET | Freq: Every day | ORAL | 3 refills | Status: AC

## 2021-02-25 NOTE — Patient Instructions (Signed)
Medication Instructions:  RESTART Plavix 75 mg daily START Zetia 10 mg daily  *If you need a refill on your cardiac medications before your next appointment, please call your pharmacy*  Lab Work: NONE ordered at this time of appointment   If you have labs (blood work) drawn today and your tests are completely normal, you will receive your results only by: [place] (if you have MyChart) OR A paper copy in the mail If you have any lab test that is abnormal or we need to change your treatment, we will call you to review the results.  Testing/Procedures: NONE ordered at this time of appointment   Follow-Up: At [facility], you and your health needs are our priority.  As part of our continuing mission to provide you with exceptional heart care, we have created designated Provider Care Teams.  These Care Teams include your primary Cardiologist (physician) and Advanced Practice Providers (APPs -  Physician Assistants and Nurse Practitioners) who all work together to provide you with the care you need, when you need it.  Your next appointment:   6 month(s)  The format for your next appointment:   In Person  Provider:   Donato Heinz, MD    Other Instructions Monitor blood pressure at home. Give our office a call if your systolic (top number) is persistently greater than 145.

## 2021-02-26 DIAGNOSIS — Z992 Dependence on renal dialysis: Secondary | ICD-10-CM | POA: Diagnosis not present

## 2021-02-26 DIAGNOSIS — N186 End stage renal disease: Secondary | ICD-10-CM | POA: Diagnosis not present

## 2021-02-27 DIAGNOSIS — N186 End stage renal disease: Secondary | ICD-10-CM | POA: Diagnosis not present

## 2021-02-27 DIAGNOSIS — Z992 Dependence on renal dialysis: Secondary | ICD-10-CM | POA: Diagnosis not present

## 2021-02-28 DIAGNOSIS — N186 End stage renal disease: Secondary | ICD-10-CM | POA: Diagnosis not present

## 2021-02-28 DIAGNOSIS — Z992 Dependence on renal dialysis: Secondary | ICD-10-CM | POA: Diagnosis not present

## 2021-03-01 ENCOUNTER — Telehealth: Payer: Self-pay

## 2021-03-01 DIAGNOSIS — N186 End stage renal disease: Secondary | ICD-10-CM | POA: Diagnosis not present

## 2021-03-01 DIAGNOSIS — Z992 Dependence on renal dialysis: Secondary | ICD-10-CM | POA: Diagnosis not present

## 2021-03-01 NOTE — Telephone Encounter (Signed)
Letter has been sent to patient instructing them to call us if they are still interested in completing their sleep study. If we have not received a response from the patient within 30 days of this notice, the order will be cancelled and they will need to discuss the need for a sleep study at their next office visit.  ° °

## 2021-03-02 DIAGNOSIS — Z992 Dependence on renal dialysis: Secondary | ICD-10-CM | POA: Diagnosis not present

## 2021-03-02 DIAGNOSIS — N186 End stage renal disease: Secondary | ICD-10-CM | POA: Diagnosis not present

## 2021-03-03 DIAGNOSIS — Z992 Dependence on renal dialysis: Secondary | ICD-10-CM | POA: Diagnosis not present

## 2021-03-03 DIAGNOSIS — N186 End stage renal disease: Secondary | ICD-10-CM | POA: Diagnosis not present

## 2021-03-04 DIAGNOSIS — N186 End stage renal disease: Secondary | ICD-10-CM | POA: Diagnosis not present

## 2021-03-04 DIAGNOSIS — Z992 Dependence on renal dialysis: Secondary | ICD-10-CM | POA: Diagnosis not present

## 2021-03-05 DIAGNOSIS — Z992 Dependence on renal dialysis: Secondary | ICD-10-CM | POA: Diagnosis not present

## 2021-03-05 DIAGNOSIS — N186 End stage renal disease: Secondary | ICD-10-CM | POA: Diagnosis not present

## 2021-03-06 DIAGNOSIS — N186 End stage renal disease: Secondary | ICD-10-CM | POA: Diagnosis not present

## 2021-03-06 DIAGNOSIS — I493 Ventricular premature depolarization: Secondary | ICD-10-CM

## 2021-03-06 DIAGNOSIS — Z992 Dependence on renal dialysis: Secondary | ICD-10-CM | POA: Diagnosis not present

## 2021-03-07 ENCOUNTER — Emergency Department (HOSPITAL_COMMUNITY)
Admission: EM | Admit: 2021-03-07 | Discharge: 2021-03-08 | Disposition: A | Discharge status: Home / Self Care | Payer: Medicare Other | Attending: Emergency Medicine | Admitting: Emergency Medicine

## 2021-03-07 ENCOUNTER — Other Ambulatory Visit: Payer: Self-pay

## 2021-03-07 ENCOUNTER — Emergency Department (HOSPITAL_COMMUNITY): Payer: Medicare Other

## 2021-03-07 ENCOUNTER — Encounter (HOSPITAL_COMMUNITY): Payer: Self-pay

## 2021-03-07 DIAGNOSIS — Z794 Long term (current) use of insulin: Secondary | ICD-10-CM | POA: Insufficient documentation

## 2021-03-07 DIAGNOSIS — I517 Cardiomegaly: Secondary | ICD-10-CM | POA: Diagnosis not present

## 2021-03-07 DIAGNOSIS — N186 End stage renal disease: Secondary | ICD-10-CM | POA: Diagnosis not present

## 2021-03-07 DIAGNOSIS — I132 Hypertensive heart and chronic kidney disease with heart failure and with stage 5 chronic kidney disease, or end stage renal disease: Secondary | ICD-10-CM | POA: Diagnosis not present

## 2021-03-07 DIAGNOSIS — Z992 Dependence on renal dialysis: Secondary | ICD-10-CM | POA: Insufficient documentation

## 2021-03-07 DIAGNOSIS — R059 Cough, unspecified: Secondary | ICD-10-CM | POA: Diagnosis not present

## 2021-03-07 DIAGNOSIS — E1140 Type 2 diabetes mellitus with diabetic neuropathy, unspecified: Secondary | ICD-10-CM | POA: Diagnosis not present

## 2021-03-07 DIAGNOSIS — Z7982 Long term (current) use of aspirin: Secondary | ICD-10-CM | POA: Insufficient documentation

## 2021-03-07 DIAGNOSIS — I5043 Acute on chronic combined systolic (congestive) and diastolic (congestive) heart failure: Secondary | ICD-10-CM | POA: Insufficient documentation

## 2021-03-07 DIAGNOSIS — Z7902 Long term (current) use of antithrombotics/antiplatelets: Secondary | ICD-10-CM | POA: Diagnosis not present

## 2021-03-07 DIAGNOSIS — I25812 Atherosclerosis of bypass graft of coronary artery of transplanted heart without angina pectoris: Secondary | ICD-10-CM | POA: Diagnosis not present

## 2021-03-07 DIAGNOSIS — R140 Abdominal distension (gaseous): Secondary | ICD-10-CM | POA: Diagnosis not present

## 2021-03-07 DIAGNOSIS — I509 Heart failure, unspecified: Secondary | ICD-10-CM

## 2021-03-07 DIAGNOSIS — Z20822 Contact with and (suspected) exposure to covid-19: Secondary | ICD-10-CM | POA: Insufficient documentation

## 2021-03-07 DIAGNOSIS — E785 Hyperlipidemia, unspecified: Secondary | ICD-10-CM | POA: Diagnosis not present

## 2021-03-07 DIAGNOSIS — E1169 Type 2 diabetes mellitus with other specified complication: Secondary | ICD-10-CM | POA: Diagnosis not present

## 2021-03-07 DIAGNOSIS — R079 Chest pain, unspecified: Secondary | ICD-10-CM | POA: Diagnosis present

## 2021-03-07 DIAGNOSIS — E11319 Type 2 diabetes mellitus with unspecified diabetic retinopathy without macular edema: Secondary | ICD-10-CM | POA: Diagnosis not present

## 2021-03-07 DIAGNOSIS — M25511 Pain in right shoulder: Secondary | ICD-10-CM | POA: Diagnosis not present

## 2021-03-07 DIAGNOSIS — E1122 Type 2 diabetes mellitus with diabetic chronic kidney disease: Secondary | ICD-10-CM | POA: Insufficient documentation

## 2021-03-07 DIAGNOSIS — Z951 Presence of aortocoronary bypass graft: Secondary | ICD-10-CM | POA: Diagnosis not present

## 2021-03-07 DIAGNOSIS — J811 Chronic pulmonary edema: Secondary | ICD-10-CM | POA: Diagnosis not present

## 2021-03-07 DIAGNOSIS — I110 Hypertensive heart disease with heart failure: Secondary | ICD-10-CM | POA: Diagnosis not present

## 2021-03-07 DIAGNOSIS — R6883 Chills (without fever): Secondary | ICD-10-CM | POA: Diagnosis not present

## 2021-03-07 LAB — RESP PANEL BY RT-PCR (FLU A&B, COVID) ARPGX2
Influenza A by PCR: NEGATIVE
Influenza B by PCR: NEGATIVE
SARS Coronavirus 2 by RT PCR: NEGATIVE

## 2021-03-07 NOTE — ED Provider Notes (Signed)
Emergency Medicine Provider Triage Evaluation Note  Donald [name] , a 50 y.o. male  was evaluated in triage.  Pt complains of Cough and congestion x2-week.  Not having any shortness of breath or chest pain.  Also complains of right shoulder pain that happened acutely after raising his arm funny at the football game.  Denies any contact, no prior surgeries..  Review of Systems  Positive: Shoulder pain, cough, congestion Negative: Shortness of breath  Physical Exam  BP (!) 163/96 (BP Location: Right Arm)   Pulse 61   Temp 98.3 F (36.8 C) (Oral)   Resp 18   SpO2 96%  Gen:   Awake, no distress   Resp:  Normal effort  MSK:   Decreased range of motion to the right shoulder secondary to pain Other:  Radial pulse 2+, good cap refill.  Lungs clear to auscultation bilaterally  Medical Decision Making  Medically screening exam initiated at 1:33 PM.  Appropriate orders placed.  [name] was informed that the remainder of the evaluation will be completed by another provider, this initial triage assessment does not replace that evaluation, and the importance of remaining in the ED until their evaluation is complete.  X-rays, COVID   Sherrill Raring, PA-C 03/07/21 1334    Regan Lemming, MD 03/07/21 [place]

## 2021-03-07 NOTE — ED Triage Notes (Signed)
Patient complains of cough x 2 weeks, reports chills with same. Patient also complains of right shoulder pain after jumping up during football game and now pain with ROM

## 2021-03-08 DIAGNOSIS — N186 End stage renal disease: Secondary | ICD-10-CM | POA: Diagnosis not present

## 2021-03-08 DIAGNOSIS — Z992 Dependence on renal dialysis: Secondary | ICD-10-CM | POA: Diagnosis not present

## 2021-03-08 LAB — CBC WITH DIFFERENTIAL/PLATELET
Abs Immature Granulocytes: 0.04 10*3/uL (ref 0.00–0.07)
Basophils Absolute: 0 10*3/uL (ref 0.0–0.1)
Basophils Relative: 0 %
Eosinophils Absolute: 0.3 10*3/uL (ref 0.0–0.5)
Eosinophils Relative: 4 %
HCT: 28.5 % — ABNORMAL LOW (ref 39.0–52.0)
Hemoglobin: 9.3 g/dL — ABNORMAL LOW (ref 13.0–17.0)
Immature Granulocytes: 1 %
Lymphocytes Relative: 16 %
Lymphs Abs: 1.1 10*3/uL (ref 0.7–4.0)
MCH: 29.7 pg (ref 26.0–34.0)
MCHC: 32.6 g/dL (ref 30.0–36.0)
MCV: 91.1 fL (ref 80.0–100.0)
Monocytes Absolute: 0.9 10*3/uL (ref 0.1–1.0)
Monocytes Relative: 13 %
Neutro Abs: 4.6 10*3/uL (ref 1.7–7.7)
Neutrophils Relative %: 66 %
Platelets: 224 10*3/uL (ref 150–400)
RBC: 3.13 MIL/uL — ABNORMAL LOW (ref 4.22–5.81)
RDW: 14.6 % (ref 11.5–15.5)
WBC: 7 10*3/uL (ref 4.0–10.5)
nRBC: 0 % (ref 0.0–0.2)

## 2021-03-08 LAB — BASIC METABOLIC PANEL
Anion gap: 14 (ref 5–15)
BUN: 78 mg/dL — ABNORMAL HIGH (ref 6–20)
CO2: 22 mmol/L (ref 22–32)
Calcium: 8.2 mg/dL — ABNORMAL LOW (ref 8.9–10.3)
Chloride: 90 mmol/L — ABNORMAL LOW (ref 98–111)
Creatinine, Ser: 18.26 mg/dL — ABNORMAL HIGH (ref 0.61–1.24)
GFR, Estimated: 3 mL/min — ABNORMAL LOW (ref >60)
Glucose, Bld: 86 mg/dL (ref 70–99)
Potassium: 5.5 mmol/L — ABNORMAL HIGH (ref 3.5–5.1)
Sodium: 126 mmol/L — ABNORMAL LOW (ref 135–145)

## 2021-03-08 LAB — TROPONIN I (HIGH SENSITIVITY): Troponin I (High Sensitivity): 53 ng/L — ABNORMAL HIGH (ref <18)

## 2021-03-08 LAB — BRAIN NATRIURETIC PEPTIDE: B Natriuretic Peptide: 1766.4 pg/mL — ABNORMAL HIGH (ref 0.0–100.0)

## 2021-03-08 MED ORDER — BENZONATATE 100 MG PO CAPS
100.0000 mg | ORAL_CAPSULE | Freq: Once | ORAL | Status: AC
  Administered 2021-03-08: 100 mg via ORAL

## 2021-03-08 MED ORDER — ALBUTEROL SULFATE HFA 108 (90 BASE) MCG/ACT IN AERS
2.0000 | INHALATION_SPRAY | Freq: Once | RESPIRATORY_TRACT | Status: AC
  Administered 2021-03-08: 2 via RESPIRATORY_TRACT

## 2021-03-08 MED ORDER — ALBUTEROL SULFATE HFA 108 (90 BASE) MCG/ACT IN AERS
INHALATION_SPRAY | RESPIRATORY_TRACT | Status: AC
  Filled 2021-03-08: qty 6.7

## 2021-03-08 MED ORDER — BENZONATATE 100 MG PO CAPS
ORAL_CAPSULE | ORAL | Status: AC
  Filled 2021-03-08: qty 1

## 2021-03-08 NOTE — ED Notes (Signed)
Pt discharged and ambulated out of the ED without difficulty. 

## 2021-03-08 NOTE — Discharge Instructions (Addendum)
Return to ED with any new or worsening symptoms such as increased shortness of breath, chest pain, increased swelling to feet or ankles Follow up with your home peritoneal dialysis therapy center for instructions on how to adjust settings for home peritoneal dialysis as you missed a session while you were in the hospital Follow up with cardiology regarding your CHF exacerbation.

## 2021-03-08 NOTE — ED Provider Notes (Signed)
[place] EMERGENCY DEPARTMENT Provider Note   CSN: [id number] Arrival date & time: 03/07/21  1159     History No chief complaint on file.   Donald [name] is a 50 y.o. male with medical history significant for ESRD, DM, CABG (2016) and HFrEF who presents for 2 weeks of cough and congestion. Patient states this has progressively worsened since onset. Patient states he has recently been around sick family members, but none of the family members were tested for viral illnesses and all of their symptoms resolved. Patient endorses chest pain with coughing, cough, sore throat, nausea, SOB, headaches, chills. Patient denies abdominal pain, fevers, diarrhea.        Past Medical History:  Diagnosis Date   CAD (coronary artery disease)    S/p CABG in 2016 in [place] //cath 2/22: Patent grafts; PDA/PLA branch is occluded beyond graft insertion; severe diffuse disease in native AV circumflex supplies small amount of myocardium-if refractory angina, PCI could be considered>> adequate therapy for now   Diabetes mellitus without complication ([place])    ESRD on peritoneal dialysis ([place])    dialysis    HFrEF (heart failure with reduced ejection fraction) (HCC)    EF 35-40   History of vitrectomy 2014   anterior, left   Hx of heart bypass surgery    Hyperlipidemia    Hypertension     Patient Active Problem List   Diagnosis Date Noted   Anemia due to chronic kidney disease, on chronic dialysis ([place]) 08/23/2020   Chronic combined systolic (congestive) and diastolic (congestive) heart failure ([place]) 06/22/2020   Acute coronary syndrome with high troponin ([place]) 06/01/2020   Normocytic anemia 06/01/2020   Prolonged QT interval 30/16/0109   Complication of vascular access for dialysis 05/31/2020   Age-related nuclear cataract of right eye 05/26/2020   Pseudophakia, left eye 05/26/2020   Right epiretinal membrane 05/26/2020   Controlled type 2 diabetes mellitus with stable  proliferative retinopathy of both eyes, with long-term current use of insulin ([place]) 05/26/2020   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 ([place]) 05/20/2020   Primary insomnia 05/20/2020   Hyperlipidemia associated with type 2 diabetes mellitus ([place]) 05/20/2020   History of amputation of left great toe ([place]) 05/20/2020   Elevated troponin    GI bleed 01/01/2020   Hematemesis 12/31/2019   Type 2 diabetes mellitus with peripheral neuropathy ([place]) 12/31/2019   Essential hypertension    ESRD on peritoneal dialysis ([place])    HLD (hyperlipidemia)    Hypertensive urgency 07/14/2019   Chest pain with high risk for cardiac etiology     Past Surgical History:  Procedure Laterality Date   AV FISTULA PLACEMENT     CARDIAC SURGERY     COLONOSCOPY WITH PROPOFOL N/A 02/11/2020   Procedure: COLONOSCOPY WITH PROPOFOL;  Surgeon: Toledo, Benay Pike, MD;  Location: [facility] ENDOSCOPY;  Service: Gastroenterology;  Laterality: N/A;   CORONARY ARTERY BYPASS GRAFT  2016   ESOPHAGOGASTRODUODENOSCOPY (EGD) WITH PROPOFOL N/A 01/02/2020   Procedure: ESOPHAGOGASTRODUODENOSCOPY (EGD) WITH PROPOFOL;  Surgeon: Lesly Rubenstein, MD;  Location: [facility] ENDOSCOPY;  Service: Endoscopy;  Laterality: N/A;   INSERTION OF DIALYSIS CATHETER     left great toe amputation     LEFT HEART CATH AND CORS/GRAFTS ANGIOGRAPHY N/A 06/03/2020   Procedure: LEFT HEART CATH AND CORS/GRAFTS ANGIOGRAPHY;  Surgeon: Sherren Mocha, MD;  Location: [facility];  Service: Cardiovascular;  Laterality: N/A;   VITRECTOMY Left 2014  Family History  Problem Relation Age of Onset   Diabetes Mellitus II Mother    Heart disease Mother    Diabetes Mother    Hypertension Mother    Kidney failure Mother    Diabetes Maternal Grandmother    Hyperlipidemia Maternal Grandmother    Dementia Maternal Grandmother     Social History   Tobacco Use   Smoking status: Never   Smokeless tobacco: Never   Vaping Use   Vaping Use: Never used  Substance Use Topics   Alcohol use: Yes    Comment: occasionally   Drug use: Yes    Types: Marijuana    Comment: occasionally    Home Medications Prior to Admission medications   Medication Sig Start Date End Date Taking? Authorizing Provider  acetaminophen-codeine (TYLENOL #3) 300-30 MG tablet Take 2 tablets by mouth every 8 (eight) hours as needed for moderate pain. 12/24/20   Ladell Pier, MD  aspirin EC 81 MG EC tablet Take 1 tablet (81 mg total) by mouth daily. 07/17/19   Nita Sells, MD  calcium acetate (PHOSLO) 667 MG capsule Take 667-2,001 mg by mouth See admin instructions. Take 3 capsules by mouth with each meal & take 1 capsule with snacks 06/30/20   [provider]  carvedilol (COREG) 12.5 MG tablet Take 1 tablet (12.5 mg total) by mouth 2 (two) times daily with a meal. 09/09/20   Donato Heinz, MD  clopidogrel (PLAVIX) 75 MG tablet Take 1 tablet (75 mg total) by mouth daily. 02/25/21   [facility], Tami Lin, PA  Continuous Blood Gluc Sensor ([place]) MISC Use to check blood sugar at least 4 times daily. Change sensor Q 2 wks 06/04/20   Ladell Pier, MD  ezetimibe (ZETIA) 10 MG tablet Take 1 tablet (10 mg total) by mouth daily. 02/25/21 05/26/21  Ledora Bottcher, PA  gentamicin cream (GARAMYCIN) 0.1 % Apply 1 application topically every morning. Apply to exit site every day    [provider]  insulin aspart (NOVOLOG) 100 UNIT/ML FlexPen Inject 6 Units into the skin 3 (three) times daily with meals. 05/20/20   Ladell Pier, MD  insulin glargine (LANTUS SOLOSTAR) 100 UNIT/ML Solostar Pen Inject 20 Units into the skin daily. Patient taking differently: Inject 20 Units into the skin every evening. 05/20/20   Ladell Pier, MD  Insulin Pen Needle (TRUEPLUS PEN NEEDLES) 31G X 6 MM MISC Use to inject Novolog TID and Lantus once daily. Total injections in 1 day = 4. 09/13/20    Ladell Pier, MD  isosorbide dinitrate (ISORDIL) 30 MG tablet Take 1 tablet (30 mg total) by mouth 2 (two) times daily. 01/29/21 01/29/22  Richardson Dopp T, PA-C  nitroGLYCERIN (NITROSTAT) 0.4 MG SL tablet Place 1 tablet (0.4 mg total) under the tongue every 5 (five) minutes as needed for chest pain. 01/29/21   Richardson Dopp T, PA-C  Peritoneal Dialysis Solutions (DIALYSIS SOLUTION 2.5% LOW-MG/LOW-CA) 394 MOSM/L SOLN dianeal solution As per nephro 07/16/19   Nita Sells, MD  rosuvastatin (CRESTOR) 40 MG tablet Take 40 mg by mouth in the morning. 12/19/19   [provider]  senna (SENOKOT) 8.6 MG tablet Take 2 tablets (17.2 mg total) by mouth daily. Patient taking differently: Take 2 tablets by mouth daily as needed for constipation. 07/16/19   Nita Sells, MD  Vitamin D, Ergocalciferol, (DRISDOL) 1.25 MG (50000 UNIT) CAPS capsule Take 50,000 Units by mouth every Thursday. 12/01/20   [provider]  Allergies    Other and Lisinopril  Review of Systems   Review of Systems  Constitutional:  Positive for chills. Negative for fever.  Respiratory:  Positive for cough and shortness of breath.   Cardiovascular:  Positive for chest pain (with coughing).  Gastrointestinal:  Positive for nausea. Negative for abdominal pain, diarrhea and vomiting.  Neurological:  Positive for headaches.  All other systems reviewed and are negative.  Physical Exam Updated Vital Signs BP 136/76 (BP Location: Right Arm)   Pulse 68   Temp 97.8 F (36.6 C) (Oral)   Resp 16   SpO2 97%   Physical Exam Vitals and nursing note reviewed.  Constitutional:      General: He is not in acute distress.    Appearance: He is not toxic-appearing.  HENT:     Nose: Nose normal.     Mouth/Throat:     Mouth: Mucous membranes are moist.  Eyes:     Extraocular Movements: Extraocular movements intact.  Cardiovascular:     Rate and Rhythm: Regular rhythm. Bradycardia present.  Pulmonary:      Effort: Pulmonary effort is normal.     Breath sounds: Normal breath sounds. No wheezing.  Abdominal:     General: There is distension.     Tenderness: There is no abdominal tenderness.     Comments: Peritoneal dialysis site noted  Musculoskeletal:     Cervical back: Normal range of motion.  Skin:    General: Skin is warm and dry.     Capillary Refill: Capillary refill takes less than 2 seconds.  Neurological:     General: No focal deficit present.     Mental Status: He is alert.    ED Results / Procedures / Treatments   Labs (all labs ordered are listed, but only abnormal results are displayed) Labs Reviewed  CBC WITH DIFFERENTIAL/PLATELET - Abnormal; Notable for the following components:      Result Value   RBC 3.13 (*)    Hemoglobin 9.3 (*)    HCT 28.5 (*)    All other components within normal limits  BASIC METABOLIC PANEL - Abnormal; Notable for the following components:   Sodium 126 (*)    Potassium 5.5 (*)    Chloride 90 (*)    BUN 78 (*)    Creatinine, Ser 18.26 (*)    Calcium 8.2 (*)    GFR, Estimated 3 (*)    All other components within normal limits  BRAIN NATRIURETIC PEPTIDE - Abnormal; Notable for the following components:   B Natriuretic Peptide 1,766.4 (*)    All other components within normal limits  TROPONIN I (HIGH SENSITIVITY) - Abnormal; Notable for the following components:   Troponin I (High Sensitivity) 53 (*)    All other components within normal limits  RESP PANEL BY RT-PCR (FLU A&B, COVID) [id number]    EKG EKG Interpretation  Date/Time:  Tuesday March 08 2021 01:30:45 EST Ventricular Rate:  67 PR Interval:  178 QRS Duration: 90 QT Interval:  434 QTC Calculation: 458 R Axis:   90 Text Interpretation: Sinus rhythm with occasional Premature ventricular complexes and Premature atrial complexes Rightward axis T wave abnormality, consider inferolateral ischemia Abnormal ECG No acute changes Confirmed by Addison Lank [phone]) on 03/08/2021  1:55:26 AM  Radiology DG Chest 2 View  Result Date: 03/07/2021 CLINICAL DATA:  Cough.  Chills.  Right shoulder pain. EXAM: CHEST - 2 VIEW COMPARISON:  Two-view chest x-ray 01/29/2021. FINDINGS: Heart is enlarged. Patient is status post  median sternotomy for CABG. Left subclavian stent is in place. Slight increased in scratched at slight increase in pulmonary vascular congestion noted. Scarring present at the left base. Ill-defined medial right upper lobe opacity somewhat more prominent today. No other focal opacities evident. Axial skeleton within normal limits. IMPRESSION: 1. Cardiomegaly with slight increase in pulmonary vascular congestion. 2. Slight increase in ill-defined medial right upper lobe opacity. While this could represent atelectasis, early infection or even mass lesion is not excluded. Recommend CT chest with contrast for further evaluation. Electronically Signed   By: San Morelle M.D.   On: 03/07/2021 14:11   DG Shoulder Right  Result Date: 03/07/2021 CLINICAL DATA:  Shoulder pain EXAM: RIGHT SHOULDER - 2+ VIEW COMPARISON:  None. FINDINGS: Alignment is anatomic. No acute fracture. Joint space is preserved. Small calcification present along rotator cuff. IMPRESSION: No acute osseous abnormality. Small calcification along the rotator cuff may reflect calcific tendinitis or bursitis. Electronically Signed   By: Macy Mis M.D.   On: 03/07/2021 14:13    Procedures Procedures   Medications Ordered in ED Medications  benzonatate (TESSALON) capsule 100 mg (100 mg Oral Given 03/08/21 0459)  albuterol (VENTOLIN HFA) 108 (90 Base) MCG/ACT inhaler 2 puff (2 puffs Inhalation Given 03/08/21 0459)    ED Course  I have reviewed the triage vital signs and the nursing notes.  Pertinent labs & imaging results that were available during my care of the patient were reviewed by me and considered in my medical decision making (see chart for details).  Clinical Course as of 03/08/21  0656  Tue Mar 08, 2021  0418 BP(!): 191/107 [CG]  9381 Spoke with Dr. Marval Regal of nephrology regarding the patient's evaluation and suspicion for degree of fluid overload contributing to his respiratory complaints.  The patient has missed his peritoneal dialysis while being in the ED tonight.  Dr. Marval Regal recommends that the patient contact home therapies today to obtain instruction on how to adjust his overnight peritoneal dialysis settings to pull more fluid for diuresis at home.  Dr. Marval Regal also states that patient may benefit from 1 exchange today for additional fluid management.  These recommendations can also be given by the home therapies on call providers. [KH]    Clinical Course User Index [CG] Azucena Cecil, PA [KH] Antonietta Breach, PA-C   MDM Rules/Calculators/A&P                          50 year old malePresents for 2 weeks of ongoing chest congestion.  On exam patient is afebrile, not hypoxic, nontoxic-appearing speaking in full sentences.  Patient does not have any bilateral pitting edema, does not have any wheezing on exam.  Current differential diagnosis includes for upper respiratory infection, CHF exacerbation, PE  Patient respiratory panel results are negative for flu and COVID.    Patient denies any recent hospitalizations, any hormonal use, any history of DVT or PE, or history of cancer.  Patient is PERC negative.  My suspicion for PE is low at this time.   Patient does not have any bilateral pitting edema to lower extremities on exam, but does have a history of CHF.  At this time I will pursue a CHF work-up to include BNP, troponin, EKG, chest x-ray.  I will also order CBC and BMP.    Patient troponin came back at 38 which is in line with baseline based on chart review. Patient EKG is unchanged compared to recent EKG of this  same patient. Patient BNP is elevated at 1,766. This could all be secondary to patient underlying ESRD. My suspicion for an underlying  cardiac event is low at this time.   Patient CBC is unchanged from past CBCs for this same patient. Patient BMP is also unchanged from past BMPs of this same patient.   Patient denies taking any fluid pills, but is on peritoneal dialysis and does not make urine.  This is most likely why he does not take a fluid pill.  The cause of this patients symptoms is most likely due to a CHF exacerbation as his BNP result was 1,766 and chest xray findings which showed increased vascular congestion. Patient will be instructed to follow up with his cardiologist as well as nephrology.   Spoke with Dr. Marval Regal of nephrology regarding patients evaluation and suspicion for degree of fluid overload contributing to his respiratory complaints. Patient has missed his peritoneal dialysis while being in ed. Dr recommends that patient contact home therapies today to obtain instructions on how to adjust his overnight perionteal dialysis settings to pull more fluid for diuresis at home. Dr also states patient may benefit form 1 exchange today for additional fluid management. These recommendations can also be given by home therapies on call providers. I have explained this to the patient who expresses understanding with need to call his home therapy group for further instructions regarding this. Patient expressed understanding and is agreeable to plan. Patient stable on discharge.       Final Clinical Impression(s) / ED Diagnoses Final diagnoses:  Acute on chronic congestive heart failure, unspecified heart failure type [facility])    Rx / DC Orders ED Discharge Orders     None        Azucena Cecil, [place] 03/08/21 0657    Fatima Blank, MD 03/08/21 [phone]

## 2021-03-09 DIAGNOSIS — Z992 Dependence on renal dialysis: Secondary | ICD-10-CM | POA: Diagnosis not present

## 2021-03-09 DIAGNOSIS — N186 End stage renal disease: Secondary | ICD-10-CM | POA: Diagnosis not present

## 2021-03-10 DIAGNOSIS — Z992 Dependence on renal dialysis: Secondary | ICD-10-CM | POA: Diagnosis not present

## 2021-03-10 DIAGNOSIS — D509 Iron deficiency anemia, unspecified: Secondary | ICD-10-CM | POA: Diagnosis not present

## 2021-03-10 DIAGNOSIS — N186 End stage renal disease: Secondary | ICD-10-CM | POA: Diagnosis not present

## 2021-03-11 DIAGNOSIS — N186 End stage renal disease: Secondary | ICD-10-CM | POA: Diagnosis not present

## 2021-03-11 DIAGNOSIS — Z992 Dependence on renal dialysis: Secondary | ICD-10-CM | POA: Diagnosis not present

## 2021-03-12 DIAGNOSIS — Z992 Dependence on renal dialysis: Secondary | ICD-10-CM | POA: Diagnosis not present

## 2021-03-12 DIAGNOSIS — N186 End stage renal disease: Secondary | ICD-10-CM | POA: Diagnosis not present

## 2021-03-13 DIAGNOSIS — Z992 Dependence on renal dialysis: Secondary | ICD-10-CM | POA: Diagnosis not present

## 2021-03-13 DIAGNOSIS — N186 End stage renal disease: Secondary | ICD-10-CM | POA: Diagnosis not present

## 2021-03-14 DIAGNOSIS — Z992 Dependence on renal dialysis: Secondary | ICD-10-CM | POA: Diagnosis not present

## 2021-03-14 DIAGNOSIS — N186 End stage renal disease: Secondary | ICD-10-CM | POA: Diagnosis not present

## 2021-03-15 DIAGNOSIS — Z992 Dependence on renal dialysis: Secondary | ICD-10-CM | POA: Diagnosis not present

## 2021-03-15 DIAGNOSIS — N186 End stage renal disease: Secondary | ICD-10-CM | POA: Diagnosis not present

## 2021-03-16 DIAGNOSIS — Z992 Dependence on renal dialysis: Secondary | ICD-10-CM | POA: Diagnosis not present

## 2021-03-16 DIAGNOSIS — N186 End stage renal disease: Secondary | ICD-10-CM | POA: Diagnosis not present

## 2021-03-17 DIAGNOSIS — Z992 Dependence on renal dialysis: Secondary | ICD-10-CM | POA: Diagnosis not present

## 2021-03-17 DIAGNOSIS — N186 End stage renal disease: Secondary | ICD-10-CM | POA: Diagnosis not present

## 2021-03-18 DIAGNOSIS — N186 End stage renal disease: Secondary | ICD-10-CM | POA: Diagnosis not present

## 2021-03-18 DIAGNOSIS — Z992 Dependence on renal dialysis: Secondary | ICD-10-CM | POA: Diagnosis not present

## 2021-03-19 DIAGNOSIS — Z992 Dependence on renal dialysis: Secondary | ICD-10-CM | POA: Diagnosis not present

## 2021-03-19 DIAGNOSIS — N186 End stage renal disease: Secondary | ICD-10-CM | POA: Diagnosis not present

## 2021-03-20 DIAGNOSIS — N186 End stage renal disease: Secondary | ICD-10-CM | POA: Diagnosis not present

## 2021-03-20 DIAGNOSIS — Z992 Dependence on renal dialysis: Secondary | ICD-10-CM | POA: Diagnosis not present

## 2021-03-21 DIAGNOSIS — N186 End stage renal disease: Secondary | ICD-10-CM | POA: Diagnosis not present

## 2021-03-21 DIAGNOSIS — Z992 Dependence on renal dialysis: Secondary | ICD-10-CM | POA: Diagnosis not present

## 2021-03-22 DIAGNOSIS — N186 End stage renal disease: Secondary | ICD-10-CM | POA: Diagnosis not present

## 2021-03-22 DIAGNOSIS — Z992 Dependence on renal dialysis: Secondary | ICD-10-CM | POA: Diagnosis not present

## 2021-03-23 DIAGNOSIS — Z992 Dependence on renal dialysis: Secondary | ICD-10-CM | POA: Diagnosis not present

## 2021-03-23 DIAGNOSIS — N186 End stage renal disease: Secondary | ICD-10-CM | POA: Diagnosis not present

## 2021-03-24 DIAGNOSIS — Z992 Dependence on renal dialysis: Secondary | ICD-10-CM | POA: Diagnosis not present

## 2021-03-24 DIAGNOSIS — N186 End stage renal disease: Secondary | ICD-10-CM | POA: Diagnosis not present

## 2021-03-25 DIAGNOSIS — N186 End stage renal disease: Secondary | ICD-10-CM | POA: Diagnosis not present

## 2021-03-25 DIAGNOSIS — Z992 Dependence on renal dialysis: Secondary | ICD-10-CM | POA: Diagnosis not present

## 2021-03-26 DIAGNOSIS — N186 End stage renal disease: Secondary | ICD-10-CM | POA: Diagnosis not present

## 2021-03-26 DIAGNOSIS — Z992 Dependence on renal dialysis: Secondary | ICD-10-CM | POA: Diagnosis not present

## 2021-03-27 DIAGNOSIS — Z992 Dependence on renal dialysis: Secondary | ICD-10-CM | POA: Diagnosis not present

## 2021-03-27 DIAGNOSIS — N186 End stage renal disease: Secondary | ICD-10-CM | POA: Diagnosis not present

## 2021-03-28 ENCOUNTER — Telehealth: Payer: Self-pay

## 2021-03-28 DIAGNOSIS — Z992 Dependence on renal dialysis: Secondary | ICD-10-CM | POA: Diagnosis not present

## 2021-03-28 DIAGNOSIS — N186 End stage renal disease: Secondary | ICD-10-CM | POA: Diagnosis not present

## 2021-03-28 MED ORDER — INSULIN LISPRO (1 UNIT DIAL) 100 UNIT/ML (KWIKPEN): 6.0000 [IU] | PEN_INJECTOR | Freq: Three times a day (TID) | SUBCUTANEOUS | 11 refills | Status: AC

## 2021-03-28 NOTE — Telephone Encounter (Signed)
Patient's insurance prefers Humalog.  If appropriate can a new script for Humalog to replace Novolog be sent to patient's pharmacy?  [facility] on [address]

## 2021-03-29 ENCOUNTER — Telehealth: Payer: Self-pay

## 2021-03-29 DIAGNOSIS — N186 End stage renal disease: Secondary | ICD-10-CM | POA: Diagnosis not present

## 2021-03-29 DIAGNOSIS — Z992 Dependence on renal dialysis: Secondary | ICD-10-CM | POA: Diagnosis not present

## 2021-03-29 NOTE — Telephone Encounter (Signed)
Novolog changed to Humalog due to insurance preference.

## 2021-03-30 DIAGNOSIS — N186 End stage renal disease: Secondary | ICD-10-CM | POA: Diagnosis not present

## 2021-03-30 DIAGNOSIS — Z992 Dependence on renal dialysis: Secondary | ICD-10-CM | POA: Diagnosis not present

## 2021-03-31 DIAGNOSIS — Z992 Dependence on renal dialysis: Secondary | ICD-10-CM | POA: Diagnosis not present

## 2021-03-31 DIAGNOSIS — N186 End stage renal disease: Secondary | ICD-10-CM | POA: Diagnosis not present

## 2021-04-01 DIAGNOSIS — Z992 Dependence on renal dialysis: Secondary | ICD-10-CM | POA: Diagnosis not present

## 2021-04-01 DIAGNOSIS — N186 End stage renal disease: Secondary | ICD-10-CM | POA: Diagnosis not present

## 2021-04-02 DIAGNOSIS — N186 End stage renal disease: Secondary | ICD-10-CM | POA: Diagnosis not present

## 2021-04-02 DIAGNOSIS — Z992 Dependence on renal dialysis: Secondary | ICD-10-CM | POA: Diagnosis not present

## 2021-04-03 DIAGNOSIS — Z992 Dependence on renal dialysis: Secondary | ICD-10-CM | POA: Diagnosis not present

## 2021-04-03 DIAGNOSIS — N186 End stage renal disease: Secondary | ICD-10-CM | POA: Diagnosis not present

## 2021-04-04 DIAGNOSIS — N186 End stage renal disease: Secondary | ICD-10-CM | POA: Diagnosis not present

## 2021-04-04 DIAGNOSIS — Z992 Dependence on renal dialysis: Secondary | ICD-10-CM | POA: Diagnosis not present

## 2021-04-05 DIAGNOSIS — N186 End stage renal disease: Secondary | ICD-10-CM | POA: Diagnosis not present

## 2021-04-05 DIAGNOSIS — Z992 Dependence on renal dialysis: Secondary | ICD-10-CM | POA: Diagnosis not present

## 2021-04-06 DIAGNOSIS — Z992 Dependence on renal dialysis: Secondary | ICD-10-CM | POA: Diagnosis not present

## 2021-04-06 DIAGNOSIS — N186 End stage renal disease: Secondary | ICD-10-CM | POA: Diagnosis not present

## 2021-04-07 ENCOUNTER — Encounter (HOSPITAL_COMMUNITY): Payer: Self-pay | Admitting: Radiology

## 2021-04-07 DIAGNOSIS — N186 End stage renal disease: Secondary | ICD-10-CM | POA: Diagnosis not present

## 2021-04-07 DIAGNOSIS — Z992 Dependence on renal dialysis: Secondary | ICD-10-CM | POA: Diagnosis not present

## 2021-04-08 DIAGNOSIS — Z992 Dependence on renal dialysis: Secondary | ICD-10-CM | POA: Diagnosis not present

## 2021-04-08 DIAGNOSIS — N186 End stage renal disease: Secondary | ICD-10-CM | POA: Diagnosis not present

## 2021-04-09 DIAGNOSIS — N186 End stage renal disease: Secondary | ICD-10-CM | POA: Diagnosis not present

## 2021-04-09 DIAGNOSIS — Z992 Dependence on renal dialysis: Secondary | ICD-10-CM | POA: Diagnosis not present

## 2021-04-10 DIAGNOSIS — Z992 Dependence on renal dialysis: Secondary | ICD-10-CM | POA: Diagnosis not present

## 2021-04-10 DIAGNOSIS — N186 End stage renal disease: Secondary | ICD-10-CM | POA: Diagnosis not present

## 2021-04-11 DIAGNOSIS — N186 End stage renal disease: Secondary | ICD-10-CM | POA: Diagnosis not present

## 2021-04-11 DIAGNOSIS — Z992 Dependence on renal dialysis: Secondary | ICD-10-CM | POA: Diagnosis not present

## 2021-04-12 DIAGNOSIS — Z992 Dependence on renal dialysis: Secondary | ICD-10-CM | POA: Diagnosis not present

## 2021-04-12 DIAGNOSIS — N186 End stage renal disease: Secondary | ICD-10-CM | POA: Diagnosis not present

## 2021-04-13 DIAGNOSIS — Z992 Dependence on renal dialysis: Secondary | ICD-10-CM | POA: Diagnosis not present

## 2021-04-13 DIAGNOSIS — E119 Type 2 diabetes mellitus without complications: Secondary | ICD-10-CM | POA: Diagnosis not present

## 2021-04-13 DIAGNOSIS — N186 End stage renal disease: Secondary | ICD-10-CM | POA: Diagnosis not present

## 2021-04-13 DIAGNOSIS — D509 Iron deficiency anemia, unspecified: Secondary | ICD-10-CM | POA: Diagnosis not present

## 2021-04-14 DIAGNOSIS — N186 End stage renal disease: Secondary | ICD-10-CM | POA: Diagnosis not present

## 2021-04-14 DIAGNOSIS — Z992 Dependence on renal dialysis: Secondary | ICD-10-CM | POA: Diagnosis not present

## 2021-04-15 DIAGNOSIS — Z992 Dependence on renal dialysis: Secondary | ICD-10-CM | POA: Diagnosis not present

## 2021-04-15 DIAGNOSIS — N186 End stage renal disease: Secondary | ICD-10-CM | POA: Diagnosis not present

## 2021-04-16 DIAGNOSIS — Z992 Dependence on renal dialysis: Secondary | ICD-10-CM | POA: Diagnosis not present

## 2021-04-16 DIAGNOSIS — N186 End stage renal disease: Secondary | ICD-10-CM | POA: Diagnosis not present

## 2021-04-17 DIAGNOSIS — Z992 Dependence on renal dialysis: Secondary | ICD-10-CM | POA: Diagnosis not present

## 2021-04-17 DIAGNOSIS — N186 End stage renal disease: Secondary | ICD-10-CM | POA: Diagnosis not present

## 2021-04-18 DIAGNOSIS — Z01810 Encounter for preprocedural cardiovascular examination: Secondary | ICD-10-CM | POA: Diagnosis not present

## 2021-04-18 DIAGNOSIS — N186 End stage renal disease: Secondary | ICD-10-CM | POA: Diagnosis not present

## 2021-04-18 DIAGNOSIS — I10 Essential (primary) hypertension: Secondary | ICD-10-CM | POA: Diagnosis not present

## 2021-04-18 DIAGNOSIS — Z992 Dependence on renal dialysis: Secondary | ICD-10-CM | POA: Diagnosis not present

## 2021-04-18 DIAGNOSIS — R9439 Abnormal result of other cardiovascular function study: Secondary | ICD-10-CM | POA: Diagnosis not present

## 2021-04-19 DIAGNOSIS — N186 End stage renal disease: Secondary | ICD-10-CM | POA: Diagnosis not present

## 2021-04-19 DIAGNOSIS — Z992 Dependence on renal dialysis: Secondary | ICD-10-CM | POA: Diagnosis not present

## 2021-04-20 DIAGNOSIS — Z992 Dependence on renal dialysis: Secondary | ICD-10-CM | POA: Diagnosis not present

## 2021-04-20 DIAGNOSIS — N186 End stage renal disease: Secondary | ICD-10-CM | POA: Diagnosis not present

## 2021-04-21 DIAGNOSIS — N186 End stage renal disease: Secondary | ICD-10-CM | POA: Diagnosis not present

## 2021-04-21 DIAGNOSIS — Z992 Dependence on renal dialysis: Secondary | ICD-10-CM | POA: Diagnosis not present

## 2021-04-21 NOTE — Telephone Encounter (Signed)
Replacement Zio monitor was never returned to the company. Order will be deleted.

## 2021-04-22 DIAGNOSIS — Z992 Dependence on renal dialysis: Secondary | ICD-10-CM | POA: Diagnosis not present

## 2021-04-22 DIAGNOSIS — N186 End stage renal disease: Secondary | ICD-10-CM | POA: Diagnosis not present

## 2021-04-23 DIAGNOSIS — N186 End stage renal disease: Secondary | ICD-10-CM | POA: Diagnosis not present

## 2021-04-23 DIAGNOSIS — Z992 Dependence on renal dialysis: Secondary | ICD-10-CM | POA: Diagnosis not present

## 2021-04-24 DIAGNOSIS — N186 End stage renal disease: Secondary | ICD-10-CM | POA: Diagnosis not present

## 2021-04-24 DIAGNOSIS — Z992 Dependence on renal dialysis: Secondary | ICD-10-CM | POA: Diagnosis not present

## 2021-04-25 ENCOUNTER — Ambulatory Visit: Payer: Medicare Other | Attending: Internal Medicine | Admitting: Internal Medicine

## 2021-04-25 ENCOUNTER — Other Ambulatory Visit: Payer: Self-pay

## 2021-04-25 ENCOUNTER — Encounter: Payer: Self-pay | Admitting: Internal Medicine

## 2021-04-25 VITALS — BP 125/76

## 2021-04-25 DIAGNOSIS — E1142 Type 2 diabetes mellitus with diabetic polyneuropathy: Secondary | ICD-10-CM

## 2021-04-25 DIAGNOSIS — Z992 Dependence on renal dialysis: Secondary | ICD-10-CM | POA: Diagnosis not present

## 2021-04-25 DIAGNOSIS — N186 End stage renal disease: Secondary | ICD-10-CM

## 2021-04-25 DIAGNOSIS — D631 Anemia in chronic kidney disease: Secondary | ICD-10-CM | POA: Diagnosis not present

## 2021-04-25 DIAGNOSIS — I129 Hypertensive chronic kidney disease with stage 1 through stage 4 chronic kidney disease, or unspecified chronic kidney disease: Secondary | ICD-10-CM | POA: Diagnosis not present

## 2021-04-25 DIAGNOSIS — I25718 Atherosclerosis of autologous vein coronary artery bypass graft(s) with other forms of angina pectoris: Secondary | ICD-10-CM

## 2021-04-25 NOTE — Progress Notes (Signed)
Virtual Visit via Telephone Note  I connected with Donald [name] on 04/25/2021 at 2:24 PM by telephone and verified that I am speaking with the correct person using two identifiers  Location: Patient: home Provider: office  Participants: Myself Patient   I discussed the limitations, risks, security and privacy concerns of performing an evaluation and management service by telephone and the availability of in person appointments. I also discussed with the patient that there may be a patient responsible charge related to this service. The patient expressed understanding and agreed to proceed.   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  Patient last evaluated 12/2020.  Purpose of today's visit is chronic disease management.  DM: Last A1c that I see in the system was done 01/14/2021 at [facility] and it was 6.4.  However patient tells me that his last one was done 2 weeks ago through his nephrologist and it was 5.6.  He checks his blood sugars every day in the mornings.  Range usually around 140.  The highest has been 160.  He denies any hypoglycemic episodes.  He is still on glargine 20 units daily and Humalog 6 units 3 times a day with meals.  ESRD: He is still doing peritoneal dialysis.  He is hoping that his heart function would be better on stress test that is planned for April.  If it is he will be able to get on the transplant list.  He has anemia associated with chronic kidney disease.  He tells me that he gets iron injection once a month.  HTN/CAD/HL: He has completed cardiac rehab. -going to gym 3 x a wk now and follows same work out as he was taught in cardiac rehab.  Rides a stationary bike for 1 hr and does light wgh lifting -saw cardiologist at [place] [facility] [place] earlier this mth.  Zetia added to meds.  Plan for repeat ST in April to see if heart has improved enough for him to be added to the kidney transplant list. -Uses sublingual nitroglycerin infrequently.  Last used about 2 weeks ago while lifting and trash can.  Went away with 1 sublingual nitroglycerin and rest. -Reports compliance with his medications including carvedilol, isosorbide, rosuvastatin, Plavix Checks blood pressure 3 times a day.  Blood pressure this morning was 125/76.  He states that his readings are good but runs a tad bit higher at night.   Observations/Objective: Lab Results  Component Value Date   WBC 7.0 03/08/2021   HGB 9.3 (L) 03/08/2021   HCT 28.5 (L) 03/08/2021   MCV 91.1 03/08/2021   PLT 224 03/08/2021     Chemistry      Component Value Date/Time   NA 126 (L) 03/08/2021 0248   NA 134 12/31/2020 1623   K 5.5 (H) 03/08/2021 0248   CL 90 (L) 03/08/2021 0248   CO2 22 03/08/2021 0248   BUN 78 (H) 03/08/2021 0248   BUN 89 (HH) 12/31/2020 1623   CREATININE 18.26 (H) 03/08/2021 0248      Component Value Date/Time   CALCIUM 8.2 (L) 03/08/2021 0248   ALKPHOS 117 01/29/2021 0021   AST 16 01/29/2021 0021   ALT 20 01/29/2021 0021   BILITOT 0.9 01/29/2021 0021      Assessment and Plan: 1. Type 2 diabetes mellitus with peripheral neuropathy (HCC) Recent  A1c is at goal. Encouraged him to continue healthy eating habits.  I have commended him on regular exercise.  He will continue current dose of Humalog 6 units with meals and glargine insulin 20 units daily.  2. Hypertensive renal disease Blood pressure at goal.  He will continue current medications and low-salt diet  3. 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 (HCC) Stable. Followed by cardiology.  He will continue isosorbide, carvedilol, Crestor, Zetia, Plavix  4. ESRD on peritoneal dialysis [facility]) Patient compliant with his dialysis schedule.  He follows up with the nephrologist.  5. Anemia due to chronic  kidney disease, on chronic dialysis ([place]) Followed by nephrology.  He is on ESA inj   Follow Up Instructions: 4 mths   I discussed the assessment and treatment plan with the patient. The patient was provided an opportunity to ask questions and all were answered. The patient agreed with the plan and demonstrated an understanding of the instructions.   The patient was advised to call back or seek an in-person evaluation if the symptoms worsen or if the condition fails to improve as anticipated.  I  Spent 12 minutes on this telephone encounter  [name], MD

## 2021-04-26 DIAGNOSIS — N186 End stage renal disease: Secondary | ICD-10-CM | POA: Diagnosis not present

## 2021-04-26 DIAGNOSIS — Z992 Dependence on renal dialysis: Secondary | ICD-10-CM | POA: Diagnosis not present

## 2021-04-27 DIAGNOSIS — Z992 Dependence on renal dialysis: Secondary | ICD-10-CM | POA: Diagnosis not present

## 2021-04-27 DIAGNOSIS — N186 End stage renal disease: Secondary | ICD-10-CM | POA: Diagnosis not present

## 2021-04-28 DIAGNOSIS — Z992 Dependence on renal dialysis: Secondary | ICD-10-CM | POA: Diagnosis not present

## 2021-04-28 DIAGNOSIS — N186 End stage renal disease: Secondary | ICD-10-CM | POA: Diagnosis not present

## 2021-04-29 ENCOUNTER — Telehealth: Payer: Self-pay | Admitting: Internal Medicine

## 2021-04-29 DIAGNOSIS — Z992 Dependence on renal dialysis: Secondary | ICD-10-CM | POA: Diagnosis not present

## 2021-04-29 DIAGNOSIS — N186 End stage renal disease: Secondary | ICD-10-CM | POA: Diagnosis not present

## 2021-04-29 NOTE — Telephone Encounter (Signed)
Copied from [place] [phone]. Topic: General - Other >> Apr 29, 2021  3:56 PM Bayard Beaver wrote: Reason for [id number] returning csll, not sure what it about. please call back

## 2021-04-30 DIAGNOSIS — N186 End stage renal disease: Secondary | ICD-10-CM | POA: Diagnosis not present

## 2021-04-30 DIAGNOSIS — Z992 Dependence on renal dialysis: Secondary | ICD-10-CM | POA: Diagnosis not present

## 2021-05-01 DIAGNOSIS — Z992 Dependence on renal dialysis: Secondary | ICD-10-CM | POA: Diagnosis not present

## 2021-05-01 DIAGNOSIS — N186 End stage renal disease: Secondary | ICD-10-CM | POA: Diagnosis not present

## 2021-05-02 DIAGNOSIS — Z992 Dependence on renal dialysis: Secondary | ICD-10-CM | POA: Diagnosis not present

## 2021-05-02 DIAGNOSIS — N186 End stage renal disease: Secondary | ICD-10-CM | POA: Diagnosis not present

## 2021-05-03 DIAGNOSIS — N186 End stage renal disease: Secondary | ICD-10-CM | POA: Diagnosis not present

## 2021-05-03 DIAGNOSIS — Z992 Dependence on renal dialysis: Secondary | ICD-10-CM | POA: Diagnosis not present

## 2021-05-03 DIAGNOSIS — I493 Ventricular premature depolarization: Secondary | ICD-10-CM | POA: Diagnosis not present

## 2021-05-04 ENCOUNTER — Ambulatory Visit (INDEPENDENT_AMBULATORY_CARE_PROVIDER_SITE_OTHER): Payer: Medicare Other

## 2021-05-04 ENCOUNTER — Other Ambulatory Visit: Payer: Self-pay | Admitting: Cardiology

## 2021-05-04 DIAGNOSIS — I493 Ventricular premature depolarization: Secondary | ICD-10-CM

## 2021-05-04 DIAGNOSIS — N186 End stage renal disease: Secondary | ICD-10-CM | POA: Diagnosis not present

## 2021-05-04 DIAGNOSIS — Z992 Dependence on renal dialysis: Secondary | ICD-10-CM | POA: Diagnosis not present

## 2021-05-05 DIAGNOSIS — Z992 Dependence on renal dialysis: Secondary | ICD-10-CM | POA: Diagnosis not present

## 2021-05-05 DIAGNOSIS — N186 End stage renal disease: Secondary | ICD-10-CM | POA: Diagnosis not present

## 2021-05-06 DIAGNOSIS — Z992 Dependence on renal dialysis: Secondary | ICD-10-CM | POA: Diagnosis not present

## 2021-05-06 DIAGNOSIS — N186 End stage renal disease: Secondary | ICD-10-CM | POA: Diagnosis not present

## 2021-05-07 DIAGNOSIS — Z992 Dependence on renal dialysis: Secondary | ICD-10-CM | POA: Diagnosis not present

## 2021-05-07 DIAGNOSIS — N186 End stage renal disease: Secondary | ICD-10-CM | POA: Diagnosis not present

## 2021-05-08 DIAGNOSIS — Z992 Dependence on renal dialysis: Secondary | ICD-10-CM | POA: Diagnosis not present

## 2021-05-08 DIAGNOSIS — N186 End stage renal disease: Secondary | ICD-10-CM | POA: Diagnosis not present

## 2021-05-09 DIAGNOSIS — N186 End stage renal disease: Secondary | ICD-10-CM | POA: Diagnosis not present

## 2021-05-09 DIAGNOSIS — Z992 Dependence on renal dialysis: Secondary | ICD-10-CM | POA: Diagnosis not present

## 2021-05-10 DIAGNOSIS — Z992 Dependence on renal dialysis: Secondary | ICD-10-CM | POA: Diagnosis not present

## 2021-05-10 DIAGNOSIS — N186 End stage renal disease: Secondary | ICD-10-CM | POA: Diagnosis not present

## 2021-05-11 DIAGNOSIS — Z992 Dependence on renal dialysis: Secondary | ICD-10-CM | POA: Diagnosis not present

## 2021-05-11 DIAGNOSIS — N186 End stage renal disease: Secondary | ICD-10-CM | POA: Diagnosis not present

## 2021-05-12 DIAGNOSIS — N186 End stage renal disease: Secondary | ICD-10-CM | POA: Diagnosis not present

## 2021-05-12 DIAGNOSIS — Z992 Dependence on renal dialysis: Secondary | ICD-10-CM | POA: Diagnosis not present

## 2021-05-13 DIAGNOSIS — Z992 Dependence on renal dialysis: Secondary | ICD-10-CM | POA: Diagnosis not present

## 2021-05-13 DIAGNOSIS — N186 End stage renal disease: Secondary | ICD-10-CM | POA: Diagnosis not present

## 2021-05-14 DIAGNOSIS — N186 End stage renal disease: Secondary | ICD-10-CM | POA: Diagnosis not present

## 2021-05-14 DIAGNOSIS — Z992 Dependence on renal dialysis: Secondary | ICD-10-CM | POA: Diagnosis not present

## 2021-05-15 DIAGNOSIS — N186 End stage renal disease: Secondary | ICD-10-CM | POA: Diagnosis not present

## 2021-05-15 DIAGNOSIS — Z992 Dependence on renal dialysis: Secondary | ICD-10-CM | POA: Diagnosis not present

## 2021-05-16 DIAGNOSIS — Z992 Dependence on renal dialysis: Secondary | ICD-10-CM | POA: Diagnosis not present

## 2021-05-16 DIAGNOSIS — N186 End stage renal disease: Secondary | ICD-10-CM | POA: Diagnosis not present

## 2021-05-17 DIAGNOSIS — Z992 Dependence on renal dialysis: Secondary | ICD-10-CM | POA: Diagnosis not present

## 2021-05-17 DIAGNOSIS — N186 End stage renal disease: Secondary | ICD-10-CM | POA: Diagnosis not present

## 2021-05-18 DIAGNOSIS — N186 End stage renal disease: Secondary | ICD-10-CM | POA: Diagnosis not present

## 2021-05-18 DIAGNOSIS — Z992 Dependence on renal dialysis: Secondary | ICD-10-CM | POA: Diagnosis not present

## 2021-05-19 DIAGNOSIS — N186 End stage renal disease: Secondary | ICD-10-CM | POA: Diagnosis not present

## 2021-05-19 DIAGNOSIS — Z992 Dependence on renal dialysis: Secondary | ICD-10-CM | POA: Diagnosis not present

## 2021-05-20 DIAGNOSIS — Z992 Dependence on renal dialysis: Secondary | ICD-10-CM | POA: Diagnosis not present

## 2021-05-20 DIAGNOSIS — N186 End stage renal disease: Secondary | ICD-10-CM | POA: Diagnosis not present

## 2021-05-21 DIAGNOSIS — Z992 Dependence on renal dialysis: Secondary | ICD-10-CM | POA: Diagnosis not present

## 2021-05-21 DIAGNOSIS — N186 End stage renal disease: Secondary | ICD-10-CM | POA: Diagnosis not present

## 2021-05-22 DIAGNOSIS — N186 End stage renal disease: Secondary | ICD-10-CM | POA: Diagnosis not present

## 2021-05-22 DIAGNOSIS — Z992 Dependence on renal dialysis: Secondary | ICD-10-CM | POA: Diagnosis not present

## 2021-05-23 DIAGNOSIS — N186 End stage renal disease: Secondary | ICD-10-CM | POA: Diagnosis not present

## 2021-05-23 DIAGNOSIS — Z992 Dependence on renal dialysis: Secondary | ICD-10-CM | POA: Diagnosis not present

## 2021-05-24 DIAGNOSIS — Z992 Dependence on renal dialysis: Secondary | ICD-10-CM | POA: Diagnosis not present

## 2021-05-24 DIAGNOSIS — N186 End stage renal disease: Secondary | ICD-10-CM | POA: Diagnosis not present

## 2021-05-25 ENCOUNTER — Encounter: Payer: Self-pay | Admitting: *Deleted

## 2021-05-25 DIAGNOSIS — Z992 Dependence on renal dialysis: Secondary | ICD-10-CM | POA: Diagnosis not present

## 2021-05-25 DIAGNOSIS — N186 End stage renal disease: Secondary | ICD-10-CM | POA: Diagnosis not present

## 2021-05-26 DIAGNOSIS — N186 End stage renal disease: Secondary | ICD-10-CM | POA: Diagnosis not present

## 2021-05-26 DIAGNOSIS — Z992 Dependence on renal dialysis: Secondary | ICD-10-CM | POA: Diagnosis not present

## 2021-05-27 DIAGNOSIS — Z992 Dependence on renal dialysis: Secondary | ICD-10-CM | POA: Diagnosis not present

## 2021-05-27 DIAGNOSIS — N186 End stage renal disease: Secondary | ICD-10-CM | POA: Diagnosis not present

## 2021-05-28 DIAGNOSIS — N186 End stage renal disease: Secondary | ICD-10-CM | POA: Diagnosis not present

## 2021-05-28 DIAGNOSIS — Z992 Dependence on renal dialysis: Secondary | ICD-10-CM | POA: Diagnosis not present

## 2021-05-29 DIAGNOSIS — Z992 Dependence on renal dialysis: Secondary | ICD-10-CM | POA: Diagnosis not present

## 2021-05-29 DIAGNOSIS — N186 End stage renal disease: Secondary | ICD-10-CM | POA: Diagnosis not present

## 2021-05-30 DIAGNOSIS — Z992 Dependence on renal dialysis: Secondary | ICD-10-CM | POA: Diagnosis not present

## 2021-05-30 DIAGNOSIS — N186 End stage renal disease: Secondary | ICD-10-CM | POA: Diagnosis not present

## 2021-05-31 DIAGNOSIS — N186 End stage renal disease: Secondary | ICD-10-CM | POA: Diagnosis not present

## 2021-05-31 DIAGNOSIS — Z992 Dependence on renal dialysis: Secondary | ICD-10-CM | POA: Diagnosis not present

## 2021-06-01 DIAGNOSIS — Z992 Dependence on renal dialysis: Secondary | ICD-10-CM | POA: Diagnosis not present

## 2021-06-01 DIAGNOSIS — N186 End stage renal disease: Secondary | ICD-10-CM | POA: Diagnosis not present

## 2021-06-02 DIAGNOSIS — Z992 Dependence on renal dialysis: Secondary | ICD-10-CM | POA: Diagnosis not present

## 2021-06-02 DIAGNOSIS — N186 End stage renal disease: Secondary | ICD-10-CM | POA: Diagnosis not present

## 2021-06-03 DIAGNOSIS — N186 End stage renal disease: Secondary | ICD-10-CM | POA: Diagnosis not present

## 2021-06-03 DIAGNOSIS — Z992 Dependence on renal dialysis: Secondary | ICD-10-CM | POA: Diagnosis not present

## 2021-06-04 DIAGNOSIS — N186 End stage renal disease: Secondary | ICD-10-CM | POA: Diagnosis not present

## 2021-06-04 DIAGNOSIS — Z992 Dependence on renal dialysis: Secondary | ICD-10-CM | POA: Diagnosis not present

## 2021-06-05 DIAGNOSIS — Z992 Dependence on renal dialysis: Secondary | ICD-10-CM | POA: Diagnosis not present

## 2021-06-05 DIAGNOSIS — N186 End stage renal disease: Secondary | ICD-10-CM | POA: Diagnosis not present

## 2021-06-06 DIAGNOSIS — N186 End stage renal disease: Secondary | ICD-10-CM | POA: Diagnosis not present

## 2021-06-06 DIAGNOSIS — Z992 Dependence on renal dialysis: Secondary | ICD-10-CM | POA: Diagnosis not present

## 2021-06-07 DIAGNOSIS — N186 End stage renal disease: Secondary | ICD-10-CM | POA: Diagnosis not present

## 2021-06-07 DIAGNOSIS — Z992 Dependence on renal dialysis: Secondary | ICD-10-CM | POA: Diagnosis not present

## 2021-06-08 DIAGNOSIS — N186 End stage renal disease: Secondary | ICD-10-CM | POA: Diagnosis not present

## 2021-06-08 DIAGNOSIS — Z992 Dependence on renal dialysis: Secondary | ICD-10-CM | POA: Diagnosis not present

## 2021-06-09 DIAGNOSIS — Z992 Dependence on renal dialysis: Secondary | ICD-10-CM | POA: Diagnosis not present

## 2021-06-09 DIAGNOSIS — N186 End stage renal disease: Secondary | ICD-10-CM | POA: Diagnosis not present

## 2021-06-10 DIAGNOSIS — Z992 Dependence on renal dialysis: Secondary | ICD-10-CM | POA: Diagnosis not present

## 2021-06-10 DIAGNOSIS — N186 End stage renal disease: Secondary | ICD-10-CM | POA: Diagnosis not present

## 2021-06-11 DIAGNOSIS — Z992 Dependence on renal dialysis: Secondary | ICD-10-CM | POA: Diagnosis not present

## 2021-06-11 DIAGNOSIS — N186 End stage renal disease: Secondary | ICD-10-CM | POA: Diagnosis not present

## 2021-06-12 DIAGNOSIS — Z992 Dependence on renal dialysis: Secondary | ICD-10-CM | POA: Diagnosis not present

## 2021-06-12 DIAGNOSIS — N186 End stage renal disease: Secondary | ICD-10-CM | POA: Diagnosis not present

## 2021-06-13 DIAGNOSIS — N186 End stage renal disease: Secondary | ICD-10-CM | POA: Diagnosis not present

## 2021-06-13 DIAGNOSIS — Z992 Dependence on renal dialysis: Secondary | ICD-10-CM | POA: Diagnosis not present

## 2021-06-14 DIAGNOSIS — N186 End stage renal disease: Secondary | ICD-10-CM | POA: Diagnosis not present

## 2021-06-14 DIAGNOSIS — Z992 Dependence on renal dialysis: Secondary | ICD-10-CM | POA: Diagnosis not present

## 2021-06-15 ENCOUNTER — Other Ambulatory Visit: Payer: Self-pay | Admitting: Internal Medicine

## 2021-06-15 DIAGNOSIS — Z7689 Persons encountering health services in other specified circumstances: Secondary | ICD-10-CM

## 2021-06-15 DIAGNOSIS — Z992 Dependence on renal dialysis: Secondary | ICD-10-CM | POA: Diagnosis not present

## 2021-06-15 DIAGNOSIS — N186 End stage renal disease: Secondary | ICD-10-CM | POA: Diagnosis not present

## 2021-06-15 NOTE — Telephone Encounter (Signed)
Medication Refill - Medication: insulin lispro (HUMALOG KWIKPEN) 100 UNIT/ML KwikPen ? ? ?insulin glargine (LANTUS SOLOSTAR) 100 UNIT/ML Solostar Pen ? ?Has the patient contacted their pharmacy? Yes.   ?(Agent: If no, request that the patient contact the pharmacy for the refill. If patient does not wish to contact the pharmacy document the reason why and proceed with request.) ?(Agent: If yes, when and what did the pharmacy advise?) Pt stated pharmacy advised them to call office to request new RX  ? ?Preferred Pharmacy (with phone number or street name):  ?[place] ([place]), [place] - [address] Phone:  [phone]  ?Fax:  [phone]  ?  ? ?Has the patient been seen for an appointment in the last year OR does the patient have an upcoming appointment? Yes.   ? ?Agent: Please be advised that RX refills may take up to 3 business days. We ask that you follow-up with your pharmacy. ?

## 2021-06-16 ENCOUNTER — Other Ambulatory Visit: Payer: Self-pay | Admitting: Internal Medicine

## 2021-06-16 DIAGNOSIS — Z7689 Persons encountering health services in other specified circumstances: Secondary | ICD-10-CM

## 2021-06-16 DIAGNOSIS — N186 End stage renal disease: Secondary | ICD-10-CM | POA: Diagnosis not present

## 2021-06-16 DIAGNOSIS — Z992 Dependence on renal dialysis: Secondary | ICD-10-CM | POA: Diagnosis not present

## 2021-06-16 NOTE — Telephone Encounter (Signed)
Requested medication (s) are due for refill today: yes ? ?Requested medication (s) are on the active medication list: yes   ? ?Last refill:  LAntus 21ml 11 refills  on 05/20/20   Humalog 58ml 11 refills on 03/28/21 ? ?Future visit scheduled yes 08/29/21 ? ?Notes to clinic: Please review. Per pharmacy new RX needed.   Thank you ? ?Requested Prescriptions  ?Pending Prescriptions Disp Refills  ? insulin glargine (LANTUS SOLOSTAR) 100 UNIT/ML Solostar Pen 15 mL 11  ?  Sig: Inject 20 Units into the skin daily.  ?  ? Endocrinology:  Diabetes - Insulins Failed - 06/15/2021  2:07 PM  ?  ?  Failed - HBA1C is between 0 and 7.9 and within 180 days  ?  HbA1c, POC (controlled diabetic range)  ?Date Value Ref Range Status  ?05/20/2020 6.2 0.0 - 7.0 % Final  ? ?Hgb A1c MFr Bld  ?Date Value Ref Range Status  ?06/01/2020 6.4 (H) 4.8 - 5.6 % Final  ?  Comment:  ?  (NOTE) ?Pre diabetes:          5.7%-6.4% ? ?Diabetes:              >6.4% ? ?Glycemic control for   <7.0% ?adults with diabetes ?  ?  ?  ?  ?  Passed - Valid encounter within last 6 months  ?  Recent Outpatient Visits   ? ?      ? 1 month ago Type 2 diabetes mellitus with peripheral neuropathy ([place])  ? [place] [name] B, MD  ? 5 months ago Acute right-sided low back pain without sciatica  ? [place] [name] B, MD  ? 9 months ago Type 2 diabetes mellitus with peripheral neuropathy ([place])  ? [place] Ladell Pier, MD  ? 1 year ago Establishing care with new doctor, encounter for  ? [place] Ladell Pier, MD  ? ?  ?  ?Future Appointments   ? ?        ? In 2 months Ladell Pier, MD [place]  ? ?  ? ?  ?  ?  ? insulin lispro (HUMALOG KWIKPEN) 100 UNIT/ML KwikPen 15 mL 11  ?  Sig: Inject 6 Units into the skin 3 (three) times daily.  ?  ? Endocrinology:  Diabetes - Insulins Failed -  06/15/2021  2:07 PM  ?  ?  Failed - HBA1C is between 0 and 7.9 and within 180 days  ?  HbA1c, POC (controlled diabetic range)  ?Date Value Ref Range Status  ?05/20/2020 6.2 0.0 - 7.0 % Final  ? ?Hgb A1c MFr Bld  ?Date Value Ref Range Status  ?06/01/2020 6.4 (H) 4.8 - 5.6 % Final  ?  Comment:  ?  (NOTE) ?Pre diabetes:          5.7%-6.4% ? ?Diabetes:              >6.4% ? ?Glycemic control for   <7.0% ?adults with diabetes ?  ?  ?  ?  ?  Passed - Valid encounter within last 6 months  ?  Recent Outpatient Visits   ? ?      ? 1 month ago Type 2 diabetes mellitus with peripheral neuropathy ([place])  ? [place] [name] B, MD  ? 5 months ago Acute right-sided  low back pain without sciatica  ? [place] [name] B, MD  ? 9 months ago Type 2 diabetes mellitus with peripheral neuropathy ([place])  ? [place] Ladell Pier, MD  ? 1 year ago Establishing care with new doctor, encounter for  ? [place] Ladell Pier, MD  ? ?  ?  ?Future Appointments   ? ?        ? In 2 months Ladell Pier, MD [place]  ? ?  ? ?  ?  ?  ? ? ? ? ?

## 2021-06-17 DIAGNOSIS — Z992 Dependence on renal dialysis: Secondary | ICD-10-CM | POA: Diagnosis not present

## 2021-06-17 DIAGNOSIS — N186 End stage renal disease: Secondary | ICD-10-CM | POA: Diagnosis not present

## 2021-06-18 DIAGNOSIS — Z992 Dependence on renal dialysis: Secondary | ICD-10-CM | POA: Diagnosis not present

## 2021-06-18 DIAGNOSIS — N186 End stage renal disease: Secondary | ICD-10-CM | POA: Diagnosis not present

## 2021-06-19 DIAGNOSIS — N186 End stage renal disease: Secondary | ICD-10-CM | POA: Diagnosis not present

## 2021-06-19 DIAGNOSIS — Z992 Dependence on renal dialysis: Secondary | ICD-10-CM | POA: Diagnosis not present

## 2021-06-20 DIAGNOSIS — Z992 Dependence on renal dialysis: Secondary | ICD-10-CM | POA: Diagnosis not present

## 2021-06-20 DIAGNOSIS — N186 End stage renal disease: Secondary | ICD-10-CM | POA: Diagnosis not present

## 2021-06-21 ENCOUNTER — Other Ambulatory Visit: Payer: Self-pay | Admitting: Internal Medicine

## 2021-06-21 DIAGNOSIS — N186 End stage renal disease: Secondary | ICD-10-CM | POA: Diagnosis not present

## 2021-06-21 DIAGNOSIS — Z7689 Persons encountering health services in other specified circumstances: Secondary | ICD-10-CM

## 2021-06-21 DIAGNOSIS — Z992 Dependence on renal dialysis: Secondary | ICD-10-CM | POA: Diagnosis not present

## 2021-06-21 NOTE — Telephone Encounter (Signed)
Patient has scheduled appointment- courtesy RF given ?Requested Prescriptions  ?Pending Prescriptions Disp Refills  ?? LANTUS SOLOSTAR 100 UNIT/ML Solostar Pen [Pharmacy Med Name: LANTUS SOLOSTAR PEN INJ 3ML] 15 mL 1  ?  Sig: ADMINISTER 20 UNITS UNDER THE SKIN DAILY  ?  ? Endocrinology:  Diabetes - Insulins Failed - 06/21/2021  1:04 PM  ?  ?  Failed - HBA1C is between 0 and 7.9 and within 180 days  ?  HbA1c, POC (controlled diabetic range)  ?Date Value Ref Range Status  ?05/20/2020 6.2 0.0 - 7.0 % Final  ? ?Hgb A1c MFr Bld  ?Date Value Ref Range Status  ?06/01/2020 6.4 (H) 4.8 - 5.6 % Final  ?  Comment:  ?  (NOTE) ?Pre diabetes:          5.7%-6.4% ? ?Diabetes:              >6.4% ? ?Glycemic control for   <7.0% ?adults with diabetes ?  ?   ?  ?  Passed - Valid encounter within last 6 months  ?  Recent Outpatient Visits   ?      ? 1 month ago Type 2 diabetes mellitus with peripheral neuropathy ([place])  ? [place] [name] B, MD  ? 5 months ago Acute right-sided low back pain without sciatica  ? [place] [name] B, MD  ? 10 months ago Type 2 diabetes mellitus with peripheral neuropathy ([place])  ? [place] Ladell Pier, MD  ? 1 year ago Establishing care with new doctor, encounter for  ? [place] Ladell Pier, MD  ?  ?  ?Future Appointments   ?        ? In 2 months Ladell Pier, MD [place]  ?  ? ?  ?  ?  ? ?

## 2021-06-21 NOTE — Telephone Encounter (Signed)
Pt called to report that he is completely out his current supply. Please advise  ?

## 2021-06-22 DIAGNOSIS — N186 End stage renal disease: Secondary | ICD-10-CM | POA: Diagnosis not present

## 2021-06-22 DIAGNOSIS — Z992 Dependence on renal dialysis: Secondary | ICD-10-CM | POA: Diagnosis not present

## 2021-06-23 DIAGNOSIS — N186 End stage renal disease: Secondary | ICD-10-CM | POA: Diagnosis not present

## 2021-06-23 DIAGNOSIS — Z992 Dependence on renal dialysis: Secondary | ICD-10-CM | POA: Diagnosis not present

## 2021-06-24 DIAGNOSIS — Z992 Dependence on renal dialysis: Secondary | ICD-10-CM | POA: Diagnosis not present

## 2021-06-24 DIAGNOSIS — N186 End stage renal disease: Secondary | ICD-10-CM | POA: Diagnosis not present

## 2021-06-25 DIAGNOSIS — N186 End stage renal disease: Secondary | ICD-10-CM | POA: Diagnosis not present

## 2021-06-25 DIAGNOSIS — Z992 Dependence on renal dialysis: Secondary | ICD-10-CM | POA: Diagnosis not present

## 2021-06-26 DIAGNOSIS — N186 End stage renal disease: Secondary | ICD-10-CM | POA: Diagnosis not present

## 2021-06-26 DIAGNOSIS — Z992 Dependence on renal dialysis: Secondary | ICD-10-CM | POA: Diagnosis not present

## 2021-06-27 DIAGNOSIS — Z992 Dependence on renal dialysis: Secondary | ICD-10-CM | POA: Diagnosis not present

## 2021-06-27 DIAGNOSIS — N186 End stage renal disease: Secondary | ICD-10-CM | POA: Diagnosis not present

## 2021-06-28 DIAGNOSIS — N186 End stage renal disease: Secondary | ICD-10-CM | POA: Diagnosis not present

## 2021-06-28 DIAGNOSIS — Z992 Dependence on renal dialysis: Secondary | ICD-10-CM | POA: Diagnosis not present

## 2021-06-29 DIAGNOSIS — N186 End stage renal disease: Secondary | ICD-10-CM | POA: Diagnosis not present

## 2021-06-29 DIAGNOSIS — Z992 Dependence on renal dialysis: Secondary | ICD-10-CM | POA: Diagnosis not present

## 2021-06-30 DIAGNOSIS — N186 End stage renal disease: Secondary | ICD-10-CM | POA: Diagnosis not present

## 2021-06-30 DIAGNOSIS — Z992 Dependence on renal dialysis: Secondary | ICD-10-CM | POA: Diagnosis not present

## 2021-07-01 DIAGNOSIS — N186 End stage renal disease: Secondary | ICD-10-CM | POA: Diagnosis not present

## 2021-07-01 DIAGNOSIS — Z992 Dependence on renal dialysis: Secondary | ICD-10-CM | POA: Diagnosis not present

## 2021-07-02 DIAGNOSIS — Z992 Dependence on renal dialysis: Secondary | ICD-10-CM | POA: Diagnosis not present

## 2021-07-02 DIAGNOSIS — N186 End stage renal disease: Secondary | ICD-10-CM | POA: Diagnosis not present

## 2021-07-03 DIAGNOSIS — N186 End stage renal disease: Secondary | ICD-10-CM | POA: Diagnosis not present

## 2021-07-03 DIAGNOSIS — Z992 Dependence on renal dialysis: Secondary | ICD-10-CM | POA: Diagnosis not present

## 2021-07-04 DIAGNOSIS — Z992 Dependence on renal dialysis: Secondary | ICD-10-CM | POA: Diagnosis not present

## 2021-07-04 DIAGNOSIS — N186 End stage renal disease: Secondary | ICD-10-CM | POA: Diagnosis not present

## 2021-07-05 DIAGNOSIS — N186 End stage renal disease: Secondary | ICD-10-CM | POA: Diagnosis not present

## 2021-07-05 DIAGNOSIS — Z992 Dependence on renal dialysis: Secondary | ICD-10-CM | POA: Diagnosis not present

## 2021-07-06 DIAGNOSIS — Z992 Dependence on renal dialysis: Secondary | ICD-10-CM | POA: Diagnosis not present

## 2021-07-06 DIAGNOSIS — N186 End stage renal disease: Secondary | ICD-10-CM | POA: Diagnosis not present

## 2021-07-07 DIAGNOSIS — N186 End stage renal disease: Secondary | ICD-10-CM | POA: Diagnosis not present

## 2021-07-07 DIAGNOSIS — Z992 Dependence on renal dialysis: Secondary | ICD-10-CM | POA: Diagnosis not present

## 2021-07-08 DIAGNOSIS — N186 End stage renal disease: Secondary | ICD-10-CM | POA: Diagnosis not present

## 2021-07-08 DIAGNOSIS — Z992 Dependence on renal dialysis: Secondary | ICD-10-CM | POA: Diagnosis not present

## 2021-07-09 DIAGNOSIS — Z992 Dependence on renal dialysis: Secondary | ICD-10-CM | POA: Diagnosis not present

## 2021-07-09 DIAGNOSIS — N186 End stage renal disease: Secondary | ICD-10-CM | POA: Diagnosis not present

## 2021-07-10 DIAGNOSIS — N186 End stage renal disease: Secondary | ICD-10-CM | POA: Diagnosis not present

## 2021-07-10 DIAGNOSIS — Z992 Dependence on renal dialysis: Secondary | ICD-10-CM | POA: Diagnosis not present

## 2021-07-11 DIAGNOSIS — N186 End stage renal disease: Secondary | ICD-10-CM | POA: Diagnosis not present

## 2021-07-11 DIAGNOSIS — Z992 Dependence on renal dialysis: Secondary | ICD-10-CM | POA: Diagnosis not present

## 2021-07-12 DIAGNOSIS — Z992 Dependence on renal dialysis: Secondary | ICD-10-CM | POA: Diagnosis not present

## 2021-07-12 DIAGNOSIS — N186 End stage renal disease: Secondary | ICD-10-CM | POA: Diagnosis not present

## 2021-07-13 ENCOUNTER — Emergency Department (HOSPITAL_COMMUNITY): Payer: Medicare Other

## 2021-07-13 ENCOUNTER — Encounter (HOSPITAL_COMMUNITY): Payer: Self-pay | Admitting: *Deleted

## 2021-07-13 ENCOUNTER — Other Ambulatory Visit: Payer: Self-pay

## 2021-07-13 ENCOUNTER — Inpatient Hospital Stay (HOSPITAL_COMMUNITY)
Admission: EM | Admit: 2021-07-13 | Discharge: 2021-07-16 | DRG: 193 | Disposition: A | Discharge status: Home Health | Payer: Medicare Other | Attending: Internal Medicine | Admitting: Internal Medicine

## 2021-07-13 DIAGNOSIS — R197 Diarrhea, unspecified: Secondary | ICD-10-CM | POA: Diagnosis not present

## 2021-07-13 DIAGNOSIS — I5042 Chronic combined systolic (congestive) and diastolic (congestive) heart failure: Secondary | ICD-10-CM | POA: Diagnosis not present

## 2021-07-13 DIAGNOSIS — R9431 Abnormal electrocardiogram [ECG] [EKG]: Secondary | ICD-10-CM | POA: Diagnosis not present

## 2021-07-13 DIAGNOSIS — D649 Anemia, unspecified: Secondary | ICD-10-CM

## 2021-07-13 DIAGNOSIS — Z992 Dependence on renal dialysis: Secondary | ICD-10-CM | POA: Diagnosis not present

## 2021-07-13 DIAGNOSIS — D631 Anemia in chronic kidney disease: Secondary | ICD-10-CM | POA: Diagnosis present

## 2021-07-13 DIAGNOSIS — J189 Pneumonia, unspecified organism: Principal | ICD-10-CM

## 2021-07-13 DIAGNOSIS — Z83438 Family history of other disorder of lipoprotein metabolism and other lipidemia: Secondary | ICD-10-CM

## 2021-07-13 DIAGNOSIS — Z20822 Contact with and (suspected) exposure to covid-19: Secondary | ICD-10-CM | POA: Diagnosis not present

## 2021-07-13 DIAGNOSIS — Z79899 Other long term (current) drug therapy: Secondary | ICD-10-CM

## 2021-07-13 DIAGNOSIS — I25708 Atherosclerosis of coronary artery bypass graft(s), unspecified, with other forms of angina pectoris: Secondary | ICD-10-CM | POA: Diagnosis present

## 2021-07-13 DIAGNOSIS — Z89412 Acquired absence of left great toe: Secondary | ICD-10-CM | POA: Diagnosis present

## 2021-07-13 DIAGNOSIS — E875 Hyperkalemia: Secondary | ICD-10-CM

## 2021-07-13 DIAGNOSIS — I25118 Atherosclerotic heart disease of native coronary artery with other forms of angina pectoris: Secondary | ICD-10-CM | POA: Diagnosis not present

## 2021-07-13 DIAGNOSIS — R112 Nausea with vomiting, unspecified: Secondary | ICD-10-CM | POA: Diagnosis present

## 2021-07-13 DIAGNOSIS — I10 Essential (primary) hypertension: Secondary | ICD-10-CM | POA: Diagnosis not present

## 2021-07-13 DIAGNOSIS — N186 End stage renal disease: Secondary | ICD-10-CM | POA: Diagnosis not present

## 2021-07-13 DIAGNOSIS — J188 Other pneumonia, unspecified organism: Secondary | ICD-10-CM

## 2021-07-13 DIAGNOSIS — D849 Immunodeficiency, unspecified: Secondary | ICD-10-CM | POA: Diagnosis present

## 2021-07-13 DIAGNOSIS — E871 Hypo-osmolality and hyponatremia: Secondary | ICD-10-CM | POA: Diagnosis present

## 2021-07-13 DIAGNOSIS — Z8249 Family history of ischemic heart disease and other diseases of the circulatory system: Secondary | ICD-10-CM

## 2021-07-13 DIAGNOSIS — Z7982 Long term (current) use of aspirin: Secondary | ICD-10-CM | POA: Diagnosis not present

## 2021-07-13 DIAGNOSIS — Z7902 Long term (current) use of antithrombotics/antiplatelets: Secondary | ICD-10-CM

## 2021-07-13 DIAGNOSIS — Z794 Long term (current) use of insulin: Secondary | ICD-10-CM

## 2021-07-13 DIAGNOSIS — R059 Cough, unspecified: Secondary | ICD-10-CM | POA: Diagnosis not present

## 2021-07-13 DIAGNOSIS — E1122 Type 2 diabetes mellitus with diabetic chronic kidney disease: Secondary | ICD-10-CM | POA: Diagnosis not present

## 2021-07-13 DIAGNOSIS — R110 Nausea: Secondary | ICD-10-CM | POA: Diagnosis not present

## 2021-07-13 DIAGNOSIS — Z833 Family history of diabetes mellitus: Secondary | ICD-10-CM

## 2021-07-13 DIAGNOSIS — I132 Hypertensive heart and chronic kidney disease with heart failure and with stage 5 chronic kidney disease, or end stage renal disease: Secondary | ICD-10-CM | POA: Diagnosis not present

## 2021-07-13 DIAGNOSIS — J810 Acute pulmonary edema: Secondary | ICD-10-CM | POA: Diagnosis not present

## 2021-07-13 DIAGNOSIS — E1142 Type 2 diabetes mellitus with diabetic polyneuropathy: Secondary | ICD-10-CM | POA: Diagnosis not present

## 2021-07-13 DIAGNOSIS — E785 Hyperlipidemia, unspecified: Secondary | ICD-10-CM | POA: Diagnosis present

## 2021-07-13 DIAGNOSIS — R42 Dizziness and giddiness: Secondary | ICD-10-CM | POA: Diagnosis not present

## 2021-07-13 DIAGNOSIS — I120 Hypertensive chronic kidney disease with stage 5 chronic kidney disease or end stage renal disease: Secondary | ICD-10-CM | POA: Diagnosis not present

## 2021-07-13 DIAGNOSIS — I25718 Atherosclerosis of autologous vein coronary artery bypass graft(s) with other forms of angina pectoris: Secondary | ICD-10-CM | POA: Diagnosis present

## 2021-07-13 DIAGNOSIS — Z841 Family history of disorders of kidney and ureter: Secondary | ICD-10-CM

## 2021-07-13 LAB — CBC WITH DIFFERENTIAL/PLATELET
Abs Immature Granulocytes: 0.06 10*3/uL (ref 0.00–0.07)
Basophils Absolute: 0 10*3/uL (ref 0.0–0.1)
Basophils Relative: 0 %
Eosinophils Absolute: 0.1 10*3/uL (ref 0.0–0.5)
Eosinophils Relative: 1 %
HCT: 33 % — ABNORMAL LOW (ref 39.0–52.0)
Hemoglobin: 10.7 g/dL — ABNORMAL LOW (ref 13.0–17.0)
Immature Granulocytes: 1 %
Lymphocytes Relative: 5 %
Lymphs Abs: 0.5 10*3/uL — ABNORMAL LOW (ref 0.7–4.0)
MCH: 29.6 pg (ref 26.0–34.0)
MCHC: 32.4 g/dL (ref 30.0–36.0)
MCV: 91.4 fL (ref 80.0–100.0)
Monocytes Absolute: 0.7 10*3/uL (ref 0.1–1.0)
Monocytes Relative: 6 %
Neutro Abs: 9.3 10*3/uL — ABNORMAL HIGH (ref 1.7–7.7)
Neutrophils Relative %: 87 %
Platelets: 159 10*3/uL (ref 150–400)
RBC: 3.61 MIL/uL — ABNORMAL LOW (ref 4.22–5.81)
RDW: 15.9 % — ABNORMAL HIGH (ref 11.5–15.5)
WBC: 10.7 10*3/uL — ABNORMAL HIGH (ref 4.0–10.5)
nRBC: 0 % (ref 0.0–0.2)

## 2021-07-13 LAB — COMPREHENSIVE METABOLIC PANEL
ALT: 20 U/L (ref 0–44)
AST: 14 U/L — ABNORMAL LOW (ref 15–41)
Albumin: 2.4 g/dL — ABNORMAL LOW (ref 3.5–5.0)
Alkaline Phosphatase: 96 U/L (ref 38–126)
Anion gap: 16 — ABNORMAL HIGH (ref 5–15)
BUN: 126 mg/dL — ABNORMAL HIGH (ref 6–20)
CO2: 20 mmol/L — ABNORMAL LOW (ref 22–32)
Calcium: 8.4 mg/dL — ABNORMAL LOW (ref 8.9–10.3)
Chloride: 95 mmol/L — ABNORMAL LOW (ref 98–111)
Creatinine, Ser: 21.98 mg/dL — ABNORMAL HIGH (ref 0.61–1.24)
GFR, Estimated: 2 mL/min — ABNORMAL LOW (ref >60)
Glucose, Bld: 109 mg/dL — ABNORMAL HIGH (ref 70–99)
Potassium: 6.4 mmol/L (ref 3.5–5.1)
Sodium: 131 mmol/L — ABNORMAL LOW (ref 135–145)
Total Bilirubin: 1.1 mg/dL (ref 0.3–1.2)
Total Protein: 5.9 g/dL — ABNORMAL LOW (ref 6.5–8.1)

## 2021-07-13 LAB — RESP PANEL BY RT-PCR (FLU A&B, COVID) ARPGX2
Influenza A by PCR: NEGATIVE
Influenza B by PCR: NEGATIVE
SARS Coronavirus 2 by RT PCR: NEGATIVE

## 2021-07-13 MED ORDER — SODIUM CHLORIDE 0.9 % IV SOLN
500.0000 mg | Freq: Once | INTRAVENOUS | Status: AC
  Administered 2021-07-14: 500 mg via INTRAVENOUS
  Filled 2021-07-13: qty 5

## 2021-07-13 MED ORDER — ONDANSETRON HCL 4 MG/2ML IJ SOLN
4.0000 mg | Freq: Once | INTRAMUSCULAR | Status: AC
  Administered 2021-07-14: 4 mg via INTRAVENOUS
  Filled 2021-07-13: qty 2

## 2021-07-13 MED ORDER — SODIUM CHLORIDE 0.9 % IV SOLN
1.0000 g | Freq: Once | INTRAVENOUS | Status: AC
  Administered 2021-07-14: 1 g via INTRAVENOUS
  Filled 2021-07-13: qty 10

## 2021-07-13 MED ORDER — CALCIUM GLUCONATE-NACL 1-0.675 GM/50ML-% IV SOLN
1.0000 g | Freq: Once | INTRAVENOUS | Status: AC
  Administered 2021-07-14: 1000 mg via INTRAVENOUS
  Filled 2021-07-13: qty 50

## 2021-07-13 MED ORDER — SODIUM ZIRCONIUM CYCLOSILICATE 10 G PO PACK
10.0000 g | PACK | Freq: Once | ORAL | Status: AC
  Administered 2021-07-14: 10 g via ORAL
  Filled 2021-07-13: qty 1

## 2021-07-13 MED ORDER — ACETAMINOPHEN 325 MG PO TABS
650.0000 mg | ORAL_TABLET | Freq: Once | ORAL | Status: AC
  Administered 2021-07-13: 650 mg via ORAL
  Filled 2021-07-13: qty 2

## 2021-07-13 NOTE — ED Triage Notes (Signed)
The pt has vomiting and diarrhea for 2 days he reports a fever ?

## 2021-07-13 NOTE — ED Notes (Signed)
Date and time results received: 07/13/21 2128 ?(use smartphrase ".now" to insert current time) ? ?Test: potassium ?Critical Value: 6.4 ? ?Name of Provider Notified: Josephina Gip, [place] ? ?Orders Received? Or Actions Taken?:  n/a ?

## 2021-07-13 NOTE — ED Provider Triage Note (Signed)
Emergency Medicine Provider Triage Evaluation Note ? ?Donald [name] , a 51 y.o. male  was evaluated in triage.  Pt complains of cough for the past 3 days, fever nausea and vomiting and diarrhea that began 2 days ago.  Also endorsing some pain along his abdomen.  Patient does peritoneal dialysis at home, supposed to have dialysis today.  No prior history of surgical intervention to his abdomen.  Has not taken anything for improvement in his symptoms.  Wife at the bedside reports that he is wheezing. ? ?Review of Systems  ?Positive: Vomiting, diarrhea, fever, cough ?Negative:  ? ?Physical Exam  ?BP (!) 169/94 (BP Location: Right Arm)   Pulse 85   Temp (!) 101.8 ?F (38.8 ?C) (Oral)   Resp (!) 22   Ht 5\' 10"  (1.778 m)   Wt 109.8 kg   SpO2 94%   BMI 34.73 kg/m?  ?Gen:   Awake, no distress   ?Resp:  Normal effort  ?MSK:   Moves extremities without difficulty  ?Other:   ? ?Medical Decision Making  ?Medically screening exam initiated at 8:36 PM.  Appropriate orders placed.  Donald [name] was informed that the remainder of the evaluation will be completed by another provider, this initial triage assessment does not replace that evaluation, and the importance of remaining in the ED until their evaluation is complete. ? ? ?  ?Janeece Fitting, PA-C ?07/13/21 2040 ? ?

## 2021-07-14 ENCOUNTER — Other Ambulatory Visit (HOSPITAL_COMMUNITY): Payer: Medicare Other

## 2021-07-14 ENCOUNTER — Encounter (HOSPITAL_COMMUNITY): Payer: Self-pay | Admitting: Internal Medicine

## 2021-07-14 DIAGNOSIS — J189 Pneumonia, unspecified organism: Secondary | ICD-10-CM

## 2021-07-14 DIAGNOSIS — Z83438 Family history of other disorder of lipoprotein metabolism and other lipidemia: Secondary | ICD-10-CM | POA: Diagnosis not present

## 2021-07-14 DIAGNOSIS — I132 Hypertensive heart and chronic kidney disease with heart failure and with stage 5 chronic kidney disease, or end stage renal disease: Secondary | ICD-10-CM | POA: Diagnosis present

## 2021-07-14 DIAGNOSIS — Z20822 Contact with and (suspected) exposure to covid-19: Secondary | ICD-10-CM | POA: Diagnosis present

## 2021-07-14 DIAGNOSIS — I25708 Atherosclerosis of coronary artery bypass graft(s), unspecified, with other forms of angina pectoris: Secondary | ICD-10-CM | POA: Diagnosis present

## 2021-07-14 DIAGNOSIS — Z841 Family history of disorders of kidney and ureter: Secondary | ICD-10-CM | POA: Diagnosis not present

## 2021-07-14 DIAGNOSIS — N186 End stage renal disease: Secondary | ICD-10-CM

## 2021-07-14 DIAGNOSIS — Z992 Dependence on renal dialysis: Secondary | ICD-10-CM

## 2021-07-14 DIAGNOSIS — Z89412 Acquired absence of left great toe: Secondary | ICD-10-CM | POA: Diagnosis not present

## 2021-07-14 DIAGNOSIS — R197 Diarrhea, unspecified: Secondary | ICD-10-CM | POA: Diagnosis present

## 2021-07-14 DIAGNOSIS — E875 Hyperkalemia: Secondary | ICD-10-CM | POA: Diagnosis present

## 2021-07-14 DIAGNOSIS — D849 Immunodeficiency, unspecified: Secondary | ICD-10-CM | POA: Diagnosis present

## 2021-07-14 DIAGNOSIS — Z8249 Family history of ischemic heart disease and other diseases of the circulatory system: Secondary | ICD-10-CM | POA: Diagnosis not present

## 2021-07-14 DIAGNOSIS — E785 Hyperlipidemia, unspecified: Secondary | ICD-10-CM | POA: Diagnosis present

## 2021-07-14 DIAGNOSIS — R112 Nausea with vomiting, unspecified: Secondary | ICD-10-CM | POA: Diagnosis present

## 2021-07-14 DIAGNOSIS — Z79899 Other long term (current) drug therapy: Secondary | ICD-10-CM | POA: Diagnosis not present

## 2021-07-14 DIAGNOSIS — E871 Hypo-osmolality and hyponatremia: Secondary | ICD-10-CM | POA: Diagnosis present

## 2021-07-14 DIAGNOSIS — Z7982 Long term (current) use of aspirin: Secondary | ICD-10-CM | POA: Diagnosis not present

## 2021-07-14 DIAGNOSIS — I25118 Atherosclerotic heart disease of native coronary artery with other forms of angina pectoris: Secondary | ICD-10-CM | POA: Diagnosis present

## 2021-07-14 DIAGNOSIS — E1142 Type 2 diabetes mellitus with diabetic polyneuropathy: Secondary | ICD-10-CM | POA: Diagnosis present

## 2021-07-14 DIAGNOSIS — E1122 Type 2 diabetes mellitus with diabetic chronic kidney disease: Secondary | ICD-10-CM | POA: Diagnosis present

## 2021-07-14 DIAGNOSIS — Z833 Family history of diabetes mellitus: Secondary | ICD-10-CM | POA: Diagnosis not present

## 2021-07-14 DIAGNOSIS — I5042 Chronic combined systolic (congestive) and diastolic (congestive) heart failure: Secondary | ICD-10-CM | POA: Diagnosis present

## 2021-07-14 DIAGNOSIS — R9431 Abnormal electrocardiogram [ECG] [EKG]: Secondary | ICD-10-CM | POA: Diagnosis not present

## 2021-07-14 DIAGNOSIS — D631 Anemia in chronic kidney disease: Secondary | ICD-10-CM | POA: Diagnosis present

## 2021-07-14 LAB — CBC WITH DIFFERENTIAL/PLATELET
Abs Immature Granulocytes: 0.08 10*3/uL — ABNORMAL HIGH (ref 0.00–0.07)
Basophils Absolute: 0 10*3/uL (ref 0.0–0.1)
Basophils Relative: 0 %
Eosinophils Absolute: 0 10*3/uL (ref 0.0–0.5)
Eosinophils Relative: 0 %
HCT: 33.4 % — ABNORMAL LOW (ref 39.0–52.0)
Hemoglobin: 11.1 g/dL — ABNORMAL LOW (ref 13.0–17.0)
Immature Granulocytes: 1 %
Lymphocytes Relative: 8 %
Lymphs Abs: 1 10*3/uL (ref 0.7–4.0)
MCH: 30.2 pg (ref 26.0–34.0)
MCHC: 33.2 g/dL (ref 30.0–36.0)
MCV: 90.8 fL (ref 80.0–100.0)
Monocytes Absolute: 0.7 10*3/uL (ref 0.1–1.0)
Monocytes Relative: 6 %
Neutro Abs: 11.1 10*3/uL — ABNORMAL HIGH (ref 1.7–7.7)
Neutrophils Relative %: 85 %
Platelets: 137 10*3/uL — ABNORMAL LOW (ref 150–400)
RBC: 3.68 MIL/uL — ABNORMAL LOW (ref 4.22–5.81)
RDW: 15.9 % — ABNORMAL HIGH (ref 11.5–15.5)
WBC: 13 10*3/uL — ABNORMAL HIGH (ref 4.0–10.5)
nRBC: 0 % (ref 0.0–0.2)

## 2021-07-14 LAB — GLUCOSE, CAPILLARY
Glucose-Capillary: 110 mg/dL — ABNORMAL HIGH (ref 70–99)
Glucose-Capillary: 136 mg/dL — ABNORMAL HIGH (ref 70–99)

## 2021-07-14 LAB — BODY FLUID CELL COUNT WITH DIFFERENTIAL
Eos, Fluid: 0 %
Lymphs, Fluid: 49 %
Monocyte-Macrophage-Serous Fluid: 29 % — ABNORMAL LOW (ref 50–90)
Neutrophil Count, Fluid: 22 % (ref 0–25)
Total Nucleated Cell Count, Fluid: 158 cu mm (ref 0–1000)

## 2021-07-14 LAB — HEPATIC FUNCTION PANEL
ALT: 21 U/L (ref 0–44)
AST: 19 U/L (ref 15–41)
Albumin: 2.3 g/dL — ABNORMAL LOW (ref 3.5–5.0)
Alkaline Phosphatase: 77 U/L (ref 38–126)
Bilirubin, Direct: 0.4 mg/dL — ABNORMAL HIGH (ref 0.0–0.2)
Indirect Bilirubin: 0.9 mg/dL (ref 0.3–0.9)
Total Bilirubin: 1.3 mg/dL — ABNORMAL HIGH (ref 0.3–1.2)
Total Protein: 5.6 g/dL — ABNORMAL LOW (ref 6.5–8.1)

## 2021-07-14 LAB — CBG MONITORING, ED
Glucose-Capillary: 77 mg/dL (ref 70–99)
Glucose-Capillary: 84 mg/dL (ref 70–99)

## 2021-07-14 LAB — TROPONIN I (HIGH SENSITIVITY)
Troponin I (High Sensitivity): 99 ng/L — ABNORMAL HIGH (ref <18)
Troponin I (High Sensitivity): 97 ng/L — ABNORMAL HIGH (ref <18)

## 2021-07-14 LAB — LACTIC ACID, PLASMA: Lactic Acid, Venous: 1 mmol/L (ref 0.5–1.9)

## 2021-07-14 LAB — BASIC METABOLIC PANEL
Anion gap: 17 — ABNORMAL HIGH (ref 5–15)
BUN: 134 mg/dL — ABNORMAL HIGH (ref 6–20)
CO2: 19 mmol/L — ABNORMAL LOW (ref 22–32)
Calcium: 8.5 mg/dL — ABNORMAL LOW (ref 8.9–10.3)
Chloride: 95 mmol/L — ABNORMAL LOW (ref 98–111)
Creatinine, Ser: 21.94 mg/dL — ABNORMAL HIGH (ref 0.61–1.24)
GFR, Estimated: 2 mL/min — ABNORMAL LOW (ref >60)
Glucose, Bld: 94 mg/dL (ref 70–99)
Potassium: 6.2 mmol/L — ABNORMAL HIGH (ref 3.5–5.1)
Sodium: 131 mmol/L — ABNORMAL LOW (ref 135–145)

## 2021-07-14 LAB — HEPATITIS B SURFACE ANTIGEN: Hepatitis B Surface Ag: NONREACTIVE

## 2021-07-14 LAB — HIV ANTIBODY (ROUTINE TESTING W REFLEX): HIV Screen 4th Generation wRfx: NONREACTIVE

## 2021-07-14 LAB — HEPATITIS B SURFACE ANTIBODY,QUALITATIVE: Hep B S Ab: REACTIVE — AB

## 2021-07-14 MED ORDER — ALTEPLASE 2 MG IJ SOLR: 2.0000 mg | Freq: Once | INTRAMUSCULAR | Status: DC | PRN

## 2021-07-14 MED ORDER — PENTAFLUOROPROP-TETRAFLUOROETH EX AERO: 1.0000 "application " | INHALATION_SPRAY | CUTANEOUS | Status: DC | PRN

## 2021-07-14 MED ORDER — SODIUM CHLORIDE 0.9 % IV SOLN: 100.0000 mL | INTRAVENOUS | Status: DC | PRN

## 2021-07-14 MED ORDER — CARVEDILOL 12.5 MG PO TABS
12.5000 mg | ORAL_TABLET | Freq: Two times a day (BID) | ORAL | Status: DC
  Administered 2021-07-14 – 2021-07-16 (×5): 12.5 mg via ORAL
  Filled 2021-07-14 (×5): qty 1

## 2021-07-14 MED ORDER — ACETAMINOPHEN 325 MG PO TABS: 650.0000 mg | ORAL_TABLET | Freq: Four times a day (QID) | ORAL | Status: DC | PRN

## 2021-07-14 MED ORDER — INSULIN GLARGINE-YFGN 100 UNIT/ML ~~LOC~~ SOLN
20.0000 [IU] | Freq: Every day | SUBCUTANEOUS | Status: DC
  Administered 2021-07-14: 20 [IU] via SUBCUTANEOUS
  Filled 2021-07-14 (×4): qty 0.2

## 2021-07-14 MED ORDER — SODIUM CHLORIDE 0.9 % IV SOLN
2.0000 g | INTRAVENOUS | Status: DC
  Administered 2021-07-14 – 2021-07-16 (×3): 2 g via INTRAVENOUS
  Filled 2021-07-14 (×3): qty 20

## 2021-07-14 MED ORDER — CLOPIDOGREL BISULFATE 75 MG PO TABS
75.0000 mg | ORAL_TABLET | Freq: Every day | ORAL | Status: DC
Start: 2021-07-14 — End: 2021-07-16
  Administered 2021-07-14 – 2021-07-16 (×3): 75 mg via ORAL
  Filled 2021-07-14 (×3): qty 1

## 2021-07-14 MED ORDER — ROSUVASTATIN CALCIUM 20 MG PO TABS
40.0000 mg | ORAL_TABLET | Freq: Every day | ORAL | Status: DC
  Administered 2021-07-14 – 2021-07-16 (×3): 40 mg via ORAL
  Filled 2021-07-14 (×3): qty 2

## 2021-07-14 MED ORDER — CHLORHEXIDINE GLUCONATE CLOTH 2 % EX PADS
6.0000 | MEDICATED_PAD | Freq: Every day | CUTANEOUS | Status: DC
  Administered 2021-07-16: 6 via TOPICAL

## 2021-07-14 MED ORDER — SODIUM CHLORIDE 0.9 % IV SOLN
500.0000 mg | INTRAVENOUS | Status: DC
  Administered 2021-07-14 – 2021-07-15 (×2): 500 mg via INTRAVENOUS
  Filled 2021-07-14 (×2): qty 5

## 2021-07-14 MED ORDER — CALCIUM ACETATE (PHOS BINDER) 667 MG PO CAPS
2001.0000 mg | ORAL_CAPSULE | Freq: Three times a day (TID) | ORAL | Status: DC
  Administered 2021-07-14 – 2021-07-16 (×6): 2001 mg via ORAL
  Filled 2021-07-14 (×5): qty 3

## 2021-07-14 MED ORDER — EZETIMIBE 10 MG PO TABS
10.0000 mg | ORAL_TABLET | Freq: Every day | ORAL | Status: DC
Start: 2021-07-14 — End: 2021-07-16
  Administered 2021-07-15 – 2021-07-16 (×2): 10 mg via ORAL
  Filled 2021-07-14 (×3): qty 1

## 2021-07-14 MED ORDER — INSULIN ASPART 100 UNIT/ML IJ SOLN: 0.0000 [IU] | Freq: Three times a day (TID) | INTRAMUSCULAR | Status: DC

## 2021-07-14 MED ORDER — DIPHENHYDRAMINE HCL 25 MG PO CAPS
25.0000 mg | ORAL_CAPSULE | Freq: Once | ORAL | Status: AC
  Administered 2021-07-14: 25 mg via ORAL
  Filled 2021-07-14: qty 1

## 2021-07-14 MED ORDER — HEPARIN SODIUM (PORCINE) 1000 UNIT/ML DIALYSIS
1000.0000 [IU] | INTRAMUSCULAR | Status: DC | PRN
  Filled 2021-07-14: qty 1

## 2021-07-14 MED ORDER — NITROGLYCERIN 0.4 MG SL SUBL: 0.4000 mg | SUBLINGUAL_TABLET | SUBLINGUAL | Status: DC | PRN

## 2021-07-14 MED ORDER — LIDOCAINE-PRILOCAINE 2.5-2.5 % EX CREA: 1.0000 "application " | TOPICAL_CREAM | CUTANEOUS | Status: DC | PRN

## 2021-07-14 MED ORDER — ISOSORBIDE DINITRATE 20 MG PO TABS
30.0000 mg | ORAL_TABLET | Freq: Two times a day (BID) | ORAL | Status: DC
  Administered 2021-07-14 – 2021-07-16 (×5): 30 mg via ORAL
  Filled 2021-07-14: qty 2
  Filled 2021-07-14: qty 1
  Filled 2021-07-14 (×2): qty 2
  Filled 2021-07-14: qty 1
  Filled 2021-07-14: qty 2

## 2021-07-14 MED ORDER — INSULIN ASPART 100 UNIT/ML IJ SOLN: 6.0000 [IU] | Freq: Three times a day (TID) | INTRAMUSCULAR | Status: DC

## 2021-07-14 MED ORDER — HEPARIN SODIUM (PORCINE) 5000 UNIT/ML IJ SOLN
5000.0000 [IU] | Freq: Three times a day (TID) | INTRAMUSCULAR | Status: DC
  Administered 2021-07-14 – 2021-07-16 (×6): 5000 [IU] via SUBCUTANEOUS
  Filled 2021-07-14 (×6): qty 1

## 2021-07-14 MED ORDER — GUAIFENESIN 100 MG/5ML PO LIQD
5.0000 mL | ORAL | Status: DC | PRN
  Administered 2021-07-14 – 2021-07-16 (×6): 5 mL via ORAL
  Filled 2021-07-14: qty 5
  Filled 2021-07-14 (×2): qty 15
  Filled 2021-07-14: qty 5
  Filled 2021-07-14 (×2): qty 15

## 2021-07-14 MED ORDER — ASPIRIN EC 81 MG PO TBEC
81.0000 mg | DELAYED_RELEASE_TABLET | Freq: Every day | ORAL | Status: DC
  Administered 2021-07-14 – 2021-07-16 (×3): 81 mg via ORAL
  Filled 2021-07-14 (×3): qty 1

## 2021-07-14 MED ORDER — LIDOCAINE HCL (PF) 1 % IJ SOLN: 5.0000 mL | INTRAMUSCULAR | Status: DC | PRN

## 2021-07-14 MED ORDER — CALCIUM ACETATE (PHOS BINDER) 667 MG PO CAPS
667.0000 mg | ORAL_CAPSULE | Freq: Two times a day (BID) | ORAL | Status: DC | PRN
  Filled 2021-07-14: qty 1

## 2021-07-14 MED ORDER — ACETAMINOPHEN 650 MG RE SUPP
650.0000 mg | Freq: Four times a day (QID) | RECTAL | Status: DC | PRN
Start: 2021-07-14 — End: 2021-07-16

## 2021-07-14 MED ORDER — SODIUM ZIRCONIUM CYCLOSILICATE 5 G PO PACK
5.0000 g | PACK | Freq: Once | ORAL | Status: AC
  Administered 2021-07-14: 5 g via ORAL
  Filled 2021-07-14: qty 1

## 2021-07-14 NOTE — ED Notes (Signed)
Pt transported to dialysis

## 2021-07-14 NOTE — ED Notes (Signed)
Admit MD at bedside

## 2021-07-14 NOTE — ED Notes (Signed)
Placed on 2LPM via [place] while sleeping for saturation of 90% ?

## 2021-07-14 NOTE — H&P (Signed)
?History and Physical  ? ? ?Donald [name] [id number]:[id number] DOB: 01-01-1971 DOA: 07/13/2021 ? ?PCP: Ladell Pier, MD  ?Patient coming from: Home. ? ?Chief Complaint: Cough.  Nausea vomiting and diarrhea. ? ?HPI: Donald [name] is a 51 y.o. male with history of ESRD on peritoneal dialysis, CAD status post CABG, hypertension, diabetes mellitus type 2 presents to the ER because of persistent cough productive of sputum for the last 2 days with nausea vomiting and diarrhea.  Patient states productive cough started first then he start developing nausea vomiting diarrhea which was nonbloody.  Denies any abdominal pain chest pain. ? ?ED Course: In the ER patient is found to be persistently coughing with chest x-ray showing features concerning for pneumonia.  Patient also was febrile with temperature 101.8 ?F.  Labs show potassium of 6.4.  WBC of 10.7.  Given that patient has persistent cough and immunosuppressed state will admit for pneumonia.  COVID test and flu test were negative. ? ?Review of Systems: As per HPI, rest all negative. ? ? ?Past Medical History:  ?Diagnosis Date  ? CAD (coronary artery disease)   ? S/p CABG in 2016 in [place] //cath 2/22: Patent grafts; PDA/PLA branch is occluded beyond graft insertion; severe diffuse disease in native AV circumflex supplies small amount of myocardium-if refractory angina, PCI could be considered>> adequate therapy for now  ? Diabetes mellitus without complication ([place])   ? ESRD on peritoneal dialysis [facility])   ? dialysis   ? HFrEF (heart failure with reduced ejection fraction) ([place])   ? EF 35-40  ? History of vitrectomy 2014  ? anterior, left  ? Hx of heart bypass surgery   ? Hyperlipidemia   ? Hypertension   ? ? ?Past Surgical History:  ?Procedure Laterality Date  ? AV FISTULA PLACEMENT    ? CARDIAC SURGERY    ? COLONOSCOPY WITH PROPOFOL N/A 02/11/2020  ? Procedure: COLONOSCOPY WITH PROPOFOL;  Surgeon: Toledo, Benay Pike, MD;  Location: [facility] ENDOSCOPY;  Service:  Gastroenterology;  Laterality: N/A;  ? CORONARY ARTERY BYPASS GRAFT  2016  ? ESOPHAGOGASTRODUODENOSCOPY (EGD) WITH PROPOFOL N/A 01/02/2020  ? Procedure: ESOPHAGOGASTRODUODENOSCOPY (EGD) WITH PROPOFOL;  Surgeon: Lesly Rubenstein, MD;  Location: [facility] ENDOSCOPY;  Service: Endoscopy;  Laterality: N/A;  ? INSERTION OF DIALYSIS CATHETER    ? left great toe amputation    ? LEFT HEART CATH AND CORS/GRAFTS ANGIOGRAPHY N/A 06/03/2020  ? Procedure: LEFT HEART CATH AND CORS/GRAFTS ANGIOGRAPHY;  Surgeon: Sherren Mocha, MD;  Location: [place] [facility];  Service: Cardiovascular;  Laterality: N/A;  ? VITRECTOMY Left 2014  ? ? ? reports that he has never smoked. He has never used smokeless tobacco. He reports current alcohol use. He reports current drug use. Drug: Marijuana. ? ?Allergies  ?Allergen Reactions  ? Other Swelling  ?  Other reaction(s): EGGPLANT ?Facial swelling  ? Lisinopril Hives  ? ? ?Family History  ?Problem Relation Age of Onset  ? Diabetes Mellitus II Mother   ? Heart disease Mother   ? Diabetes Mother   ? Hypertension Mother   ? Kidney failure Mother   ? Diabetes Maternal Grandmother   ? Hyperlipidemia Maternal Grandmother   ? Dementia Maternal Grandmother   ? ? ?Prior to Admission medications   ?Medication Sig Start Date End Date Taking? Authorizing Provider  ?acetaminophen-codeine (TYLENOL #3) 300-30 MG tablet Take 2 tablets by mouth every 8 (eight) hours as needed for moderate pain. 12/24/20   Ladell Pier, MD  ?aspirin EC 81  MG EC tablet Take 1 tablet (81 mg total) by mouth daily. 07/17/19   Nita Sells, MD  ?calcium acetate (PHOSLO) 667 MG capsule Take 667-2,001 mg by mouth See admin instructions. Take 3 capsules by mouth with each meal & take 1 capsule with snacks 06/30/20   [provider]  ?carvedilol (COREG) 12.5 MG tablet Take 1 tablet (12.5 mg total) by mouth 2 (two) times daily with a meal. 09/09/20   Donato Heinz, MD  ?clopidogrel (PLAVIX) 75 MG tablet Take 1 tablet  (75 mg total) by mouth daily. 02/25/21   Ledora Bottcher, PA  ?Continuous Blood Gluc Sensor (FREESTYLE LIBRE SENSOR SYSTEM) MISC Use to check blood sugar at least 4 times daily. Change sensor Q 2 wks 06/04/20   Ladell Pier, MD  ?ezetimibe (ZETIA) 10 MG tablet Take 1 tablet (10 mg total) by mouth daily. 02/25/21 05/26/21  Ledora Bottcher, PA  ?gentamicin cream (GARAMYCIN) 0.1 % Apply 1 application topically every morning. Apply to exit site every day    [provider]  ?insulin lispro (HUMALOG KWIKPEN) 100 UNIT/ML KwikPen Inject 6 Units into the skin 3 (three) times daily. 03/28/21   Ladell Pier, MD  ?Insulin Pen Needle (TRUEPLUS PEN NEEDLES) 31G X 6 MM MISC Use to inject Novolog TID and Lantus once daily. Total injections in 1 day = 4. 09/13/20   Ladell Pier, MD  ?isosorbide dinitrate (ISORDIL) 30 MG tablet Take 1 tablet (30 mg total) by mouth 2 (two) times daily. 01/29/21 01/29/22  Richardson Dopp T, PA-C  ?LANTUS SOLOSTAR 100 UNIT/ML Solostar Pen ADMINISTER 20 UNITS UNDER THE SKIN DAILY 06/21/21   Ladell Pier, MD  ?nitroGLYCERIN (NITROSTAT) 0.4 MG SL tablet Place 1 tablet (0.4 mg total) under the tongue every 5 (five) minutes as needed for chest pain. 01/29/21   Richardson Dopp T, PA-C  ?Peritoneal Dialysis Solutions (DIALYSIS SOLUTION 2.5% LOW-MG/LOW-CA) 394 MOSM/L SOLN dianeal solution As per nephro 07/16/19   Nita Sells, MD  ?rosuvastatin (CRESTOR) 40 MG tablet Take 40 mg by mouth in the morning. 12/19/19   [provider]  ?senna (SENOKOT) 8.6 MG tablet Take 2 tablets (17.2 mg total) by mouth daily. ?Patient taking differently: Take 2 tablets by mouth daily as needed for constipation. 07/16/19   Nita Sells, MD  ?Vitamin D, Ergocalciferol, (DRISDOL) 1.25 MG (50000 UNIT) CAPS capsule Take 50,000 Units by mouth every Thursday. 12/01/20   [provider]  ? ? ?Physical Exam: ?Constitutional: Moderately built and nourished. ?Vitals:  ? 07/14/21  0045 07/14/21 0145 07/14/21 0200 07/14/21 0230  ?BP: 113/77 118/69 119/77 111/71  ?Pulse: 74 75 69 71  ?Resp: (!) 30 (!) 29 20 (!) 22  ?Temp:      ?TempSrc:      ?SpO2:  93% 90% 90%  ?Weight:      ?Height:      ? ?Eyes: Anicteric no pallor. ?ENMT: No discharge from the ears eyes nose and mouth. ?Neck: No mass felt.  No neck rigidity. ?Respiratory: No rhonchi or crepitations. ?Cardiovascular: S1-S2 heard. ?Abdomen: Soft nontender bowel sound present. ?Musculoskeletal: No edema. ?Skin: No rash. ?Neurologic: Alert awake oriented time place and person.  Moves all extremities. ?Psychiatric: Appears normal.  Normal affect. ? ? ?Labs on Admission: I have personally reviewed following labs and imaging studies ? ?CBC: ?Recent Labs  ?Lab 07/13/21 ?2050  ?WBC 10.7*  ?NEUTROABS 9.3*  ?HGB 10.7*  ?HCT 33.0*  ?MCV 91.4  ?PLT 159  ? ?Basic Metabolic  Panel: ?Recent Labs  ?Lab 07/13/21 ?2050  ?NA 131*  ?K 6.4*  ?CL 95*  ?CO2 20*  ?GLUCOSE 109*  ?BUN 126*  ?CREATININE 21.98*  ?CALCIUM 8.4*  ? ?GFR: ?Estimated Creatinine Clearance: 5 mL/min (A) (by C-G formula based on SCr of 21.98 mg/dL (H)). ?Liver Function Tests: ?Recent Labs  ?Lab 07/13/21 ?2050  ?AST 14*  ?ALT 20  ?ALKPHOS 96  ?BILITOT 1.1  ?PROT 5.9*  ?ALBUMIN 2.4*  ? ?No results for input(s): LIPASE, AMYLASE in the last 168 hours. ?No results for input(s): AMMONIA in the last 168 hours. ?Coagulation Profile: ?No results for input(s): INR, PROTIME in the last 168 hours. ?Cardiac Enzymes: ?No results for input(s): CKTOTAL, CKMB, CKMBINDEX, TROPONINI in the last 168 hours. ?BNP (last 3 results) ?No results for input(s): PROBNP in the last 8760 hours. ?HbA1C: ?No results for input(s): HGBA1C in the last 72 hours. ?CBG: ?No results for input(s): GLUCAP in the last 168 hours. ?Lipid Profile: ?No results for input(s): CHOL, HDL, LDLCALC, TRIG, CHOLHDL, LDLDIRECT in the last 72 hours. ?Thyroid Function Tests: ?No results for input(s): TSH, T4TOTAL, FREET4, T3FREE, THYROIDAB in the last  72 hours. ?Anemia Panel: ?No results for input(s): VITAMINB12, FOLATE, FERRITIN, TIBC, IRON, RETICCTPCT in the last 72 hours. ?Urine analysis: ?No results found for: COLORURINE, APPEARANCEUR, [place], [place],

## 2021-07-14 NOTE — Progress Notes (Signed)
?PROGRESS NOTE ? ?Donald [name]  ?DOB: 12/26/1970  ?PCP: Ladell Pier, MD ?QMG:[id number]  ?DOA: 07/13/2021 ? LOS: 0 days  ?Hospital Day: 2 ? ?Brief narrative: ?Donald [name] is a 51 y.o. male with PMH significant for ESRD on PD, CAD/CABG, chronic systolic CHF, DM 2, HTN, HLD. ?Patient presented to the ED on 07/13/2021 with complaint of persistent productive cough for 2 days associated with nausea, vomiting and diarrhea. ? ?In the ED, patient was noted to have a temperature of 101.8 ?Labs showed sodium low at 131, potassium elevated to 6.4, serum bicarb 20, WBC count 10.7, hemoglobin 10.7 ?Chest x-ray showed cardiomegaly with progressive vascular congestion, symmetric perihilar opacities right greater than left. ?COVID and flu PCR negative ?Admitted to hospitalist service ? ? ?Subjective: ?Patient was seen and examined this morning.  Pleasant middle-aged African-American male.  Sleepy.  Trying to catch up last sleep from last night.  States that he has not been able to sleep well for last 4 days because of nighttime cough. ?Chart reviewed ?Labs from this morning with potassium 6.2 ? ?Principal Problem: ?  Multifocal pneumonia ?Active Problems: ?  Type 2 diabetes mellitus with peripheral neuropathy ([place]) ?  ESRD on peritoneal dialysis [facility]) ?  HLD (hyperlipidemia) ?  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 ([place]) ?  History of amputation of left great toe ([place]) ?  Chronic combined systolic (congestive) and diastolic (congestive) heart failure (HCC) ?  Anemia due to chronic kidney disease, on chronic dialysis ([place]) ?  ? ?Assessment and Plan: ?Nocturnal cough ?-Pneumonia versus CHF exacerbation. ?-Repeat echo. ?-Antitussives ? ?Community-acquired pneumonia ?-Presented with productive cough, mostly nocturnal ?-Chest x-ray with multifocal opacities ?-Agree with broad-spectrum antibiotics for now. ?-Pending blood culture and sputum culture. ? ?History of chronic combined  systolic and diastolic CHF ?-Echocardiogram from February 2022 with EF 35 to 40%, regional wall motion abnormalities, moderate LVH, grade 1 diastolic dysfunction.  Repeat echo ?-Volume status stable at this time ?-Continue Coreg 12.5 mg twice daily, Imdur 30 mg twice daily.   ?-Continue to monitor blood pressure and CHF symptoms. ? ?Nausea, vomiting, diarrhea ?-Benign abdomen on exam.  Symptomatic management. ? ?ESRD on peritoneal dialysis  ?Hyperkalemia, hyponatremia ?-Potassium level elevated to 6.4 initially.  Lokelma was given  ?-Repeat BMP  ?-Nephrology involved ?Recent Labs  ?Lab 07/13/21 ?2050 07/14/21 ?0506  ?NA 131* 131*  ?K 6.4* 6.2*  ?CL 95* 95*  ?CO2 20* 19*  ?GLUCOSE 109* 94  ?BUN 126* 134*  ?CREATININE 21.98* 21.94*  ?CALCIUM 8.4* 8.5*  ? ?Type 2 diabetes mellitus ?-A1c 6.4 on February 2022 ?-Home meds include Lantus 20 units in the morning and Premeal NovoLog 6 units 3 times daily.  Premeal sliding scale insulin ?-Currently continued on the same. ?-Blood sugar level ?Recent Labs  ?Lab 07/14/21 ?8916  ?GLUCAP 77  ? ?CAD/CABG  ?-Denies chest pain.  Mild elevated troponin in the setting of ESRD ?-Continue aspirin, Plavix, Zetia, beta-blocker and isosorbide. ?Recent Labs  ?  07/14/21 ?0506 07/14/21 ?0810  ?TROPONINIHS 99* 97*  ? ?Goals of care ?  Code Status: Full Code  ? ? ?Mobility: Encourage ambulation.  Independent prior to presentation ? ?Nutritional status:  ?Body mass index is 34.73 kg/m?.  ?  ?  ? ? ? ? ?Diet:  ?Diet Order   ? ?       ?  Diet renal/carb modified with fluid restriction Diet-HS Snack? Nothing; Fluid restriction: 1200 mL Fluid; Room service appropriate? Yes;  Fluid consistency: Thin  Diet effective now       ?  ? ?  ?  ? ?  ? ? ?DVT prophylaxis:  ?heparin injection 5,000 Units Start: 07/14/21 0600 ?  ?Antimicrobials: IV Rocephin, azithromycin ?Fluid: None ?Consultants: Nephrology ?Family Communication: None at bedside ? ?Status is: Inpatient ? ?Continue in-hospital care because: IV  antibiotics, ?Level of care: Telemetry Medical  ? ?Dispo: The patient is from: Home ?             Anticipated d/c is to: Hopefully home in 1 to 2 days ?             Patient currently is not medically stable to d/c. ?  Difficult to place patient No ? ? ? ? ?Infusions:  ? sodium chloride    ? sodium chloride    ? azithromycin    ? cefTRIAXone (ROCEPHIN)  IV    ? ? ?Scheduled Meds: ? aspirin EC  81 mg Oral Daily  ? calcium acetate  2,001 mg Oral TID with meals  ? carvedilol  12.5 mg Oral BID WC  ? Chlorhexidine Gluconate Cloth  6 each Topical Q0600  ? clopidogrel  75 mg Oral Daily  ? ezetimibe  10 mg Oral Daily  ? heparin  5,000 Units Subcutaneous Q8H  ? insulin aspart  0-6 Units Subcutaneous TID WC  ? insulin glargine-yfgn  20 Units Subcutaneous Daily  ? isosorbide dinitrate  30 mg Oral BID  ? rosuvastatin  40 mg Oral Daily  ? ? ?PRN meds: ?sodium chloride, sodium chloride, acetaminophen **OR** acetaminophen, alteplase, calcium acetate, guaiFENesin, heparin, lidocaine (PF), lidocaine-prilocaine, nitroGLYCERIN, pentafluoroprop-tetrafluoroeth  ? ?Antimicrobials: ?Anti-infectives (From admission, onward)  ? ? Start     Dose/Rate Route Frequency Ordered Stop  ? 07/14/21 1600  cefTRIAXone (ROCEPHIN) 2 g in sodium chloride 0.9 % 100 mL IVPB       ? 2 g ?200 mL/hr over 30 Minutes Intravenous Every 24 hours 07/14/21 0257 07/19/21 1559  ? 07/14/21 1600  azithromycin (ZITHROMAX) 500 mg in sodium chloride 0.9 % 250 mL IVPB       ? 500 mg ?250 mL/hr over 60 Minutes Intravenous Every 24 hours 07/14/21 0257 07/19/21 1559  ? 07/13/21 2345  cefTRIAXone (ROCEPHIN) 1 g in sodium chloride 0.9 % 100 mL IVPB       ? 1 g ?200 mL/hr over 30 Minutes Intravenous  Once 07/13/21 2341 07/14/21 0109  ? 07/13/21 2345  azithromycin (ZITHROMAX) 500 mg in sodium chloride 0.9 % 250 mL IVPB       ? 500 mg ?250 mL/hr over 60 Minutes Intravenous  Once 07/13/21 2341 07/14/21 0155  ? ?  ? ? ?Objective: ?Vitals:  ? 07/14/21 0930 07/14/21 1100  ?BP: 132/78  (!) 123/56  ?Pulse:  71  ?Resp: 15 18  ?Temp:    ?SpO2:  96%  ? ?No intake or output data in the 24 hours ending 07/14/21 1152 ?Filed Weights  ? 07/13/21 2033  ?Weight: 109.8 kg  ? ?Weight change:  ?Body mass index is 34.73 kg/m?.  ? ?Physical Exam: ?General exam: Pleasant, middle-aged African-American male.  Sleepy.  Not in physical distress ?Skin: No rashes, lesions or ulcers. ?HEENT: Atraumatic, normocephalic, no obvious bleeding ?Lungs: Clear to auscultation bilaterally ?CVS: Regular rate and rhythm, no murmur ?GI/Abd soft, nontender, nondistended, bowel sound present ?CNS: Sleepy, opens eyes on verbal command, oriented x3 ?Psychiatry: Mood appropriate ?Extremities: No pedal edema, no calf tenderness ? ?Data Review: I have personally  reviewed the laboratory data and studies available. ? ?F/u labs ordered ?Unresulted Labs (From admission, onward)  ? ?  Start     Ordered  ? 07/15/21 0500  CBC with Differential/Platelet  Tomorrow morning,   R       ? 07/14/21 1152  ? 07/15/21 4920  Basic metabolic panel  Tomorrow morning,   R       ? 07/14/21 1152  ? 07/14/21 1022  Hepatitis B surface antigen  (New Admission Hemo Labs (Hepatitis B))  Once,   R       ? 07/14/21 1021  ? 07/14/21 1022  Hepatitis B surface antibody  (New Admission Hemo Labs (Hepatitis B))  Once,   R       ? 07/14/21 1021  ? 07/14/21 1022  Hepatitis B surface antibody,quantitative  (New Admission Hemo Labs (Hepatitis B))  Once,   R       ? 07/14/21 1021  ? 07/14/21 0801  Body fluid cell count with differential  Once,   R       ?Question:  Are there also cytology or pathology orders on this specimen?  Answer:  No  ? 07/14/21 0800  ? 07/14/21 0801  Body fluid culture w Gram Stain  Once,   R       ?Question Answer Comment  ?Are there also cytology or pathology orders on this specimen? No   ?Patient immune status Immunocompromised   ?  ? 07/14/21 0800  ? 07/14/21 0331  Gastrointestinal Panel by PCR , Stool  (Gastrointestinal Panel by PCR, Stool                                                                                                                                                      **Does Not include CLOSTRIDIUM DIFFICILE testing. **If CDIFF testing is

## 2021-07-14 NOTE — ED Provider Notes (Signed)
?[facility] ?Provider Note ? ?CSN: [id number] ?Arrival date & time: 07/13/21 2000 ? ?Chief Complaint(s) ?Nausea ? ?HPI ?Donald [name] is a 51 y.o. male with a past medical history listed below including ESRD on peritoneal dialysis, CHF with a last EF of 35 to 40% in February 2022. ? ?Patient is here for several days of nausea, vomiting, cough, diarrhea.  Today he developed a fever.  He is endorsing shortness of breath especially with lying down.  He has not missed any of his dialysis sessions except for this evening because of being here.  Denies any chest pain.  No abdominal pain.  Only sick contact was a child in the family with viral symptoms. ? ?The history is provided by the patient.  ? ?Past Medical History ?Past Medical History:  ?Diagnosis Date  ? CAD (coronary artery disease)   ? S/p CABG in 2016 in [place] //cath 2/22: Patent grafts; PDA/PLA branch is occluded beyond graft insertion; severe diffuse disease in native AV circumflex supplies small amount of myocardium-if refractory angina, PCI could be considered>> adequate therapy for now  ? Diabetes mellitus without complication ([place])   ? ESRD on peritoneal dialysis [facility])   ? dialysis   ? HFrEF (heart failure with reduced ejection fraction) ([place])   ? EF 35-40  ? History of vitrectomy 2014  ? anterior, left  ? Hx of heart bypass surgery   ? Hyperlipidemia   ? Hypertension   ? ?Patient Active Problem List  ? Diagnosis Date Noted  ? CAP (community acquired pneumonia) 07/14/2021  ? Anemia due to chronic kidney disease, on chronic dialysis ([place]) 08/23/2020  ? Chronic combined systolic (congestive) and diastolic (congestive) heart failure ([place]) 06/22/2020  ? Acute coronary syndrome with high troponin ([place]) 06/01/2020  ? Normocytic anemia 06/01/2020  ? Prolonged QT interval 06/01/2020  ? Complication of vascular access for dialysis 05/31/2020  ? Age-related nuclear cataract of right eye 05/26/2020  ? Pseudophakia, left  eye 05/26/2020  ? Right epiretinal membrane 05/26/2020  ? Controlled type 2 diabetes mellitus with stable proliferative retinopathy of both eyes, with long-term current use of insulin ([place]) 05/26/2020  ? 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 ([place]) 05/20/2020  ? Primary insomnia 05/20/2020  ? Hyperlipidemia associated with type 2 diabetes mellitus ([place]) 05/20/2020  ? History of amputation of left great toe ([place]) 05/20/2020  ? Elevated troponin   ? GI bleed 01/01/2020  ? Hematemesis 12/31/2019  ? Type 2 diabetes mellitus with peripheral neuropathy ([place]) 12/31/2019  ? Essential hypertension   ? ESRD on peritoneal dialysis [facility])   ? HLD (hyperlipidemia)   ? Hypertensive urgency 07/14/2019  ? Chest pain with high risk for cardiac etiology   ? ?Home Medication(s) ?Prior to Admission medications   ?Medication Sig Start Date End Date Taking? Authorizing Provider  ?acetaminophen-codeine (TYLENOL #3) 300-30 MG tablet Take 2 tablets by mouth every 8 (eight) hours as needed for moderate pain. 12/24/20   Ladell Pier, MD  ?aspirin EC 81 MG EC tablet Take 1 tablet (81 mg total) by mouth daily. 07/17/19   Nita Sells, MD  ?calcium acetate (PHOSLO) 667 MG capsule Take 667-2,001 mg by mouth See admin instructions. Take 3 capsules by mouth with each meal & take 1 capsule with snacks 06/30/20   [provider]  ?carvedilol (COREG) 12.5 MG tablet Take 1 tablet (12.5 mg total) by mouth 2 (two) times daily with a meal. 09/09/20  Donato Heinz, MD  ?clopidogrel (PLAVIX) 75 MG tablet Take 1 tablet (75 mg total) by mouth daily. 02/25/21   Ledora Bottcher, PA  ?Continuous Blood Gluc Sensor (FREESTYLE LIBRE SENSOR SYSTEM) MISC Use to check blood sugar at least 4 times daily. Change sensor Q 2 wks 06/04/20   Ladell Pier, MD  ?ezetimibe (ZETIA) 10 MG tablet Take 1 tablet (10 mg total) by mouth daily. 02/25/21 05/26/21  Ledora Bottcher, PA  ?gentamicin cream  (GARAMYCIN) 0.1 % Apply 1 application topically every morning. Apply to exit site every day    [provider]  ?insulin lispro (HUMALOG KWIKPEN) 100 UNIT/ML KwikPen Inject 6 Units into the skin 3 (three) times daily. 03/28/21   Ladell Pier, MD  ?Insulin Pen Needle (TRUEPLUS PEN NEEDLES) 31G X 6 MM MISC Use to inject Novolog TID and Lantus once daily. Total injections in 1 day = 4. 09/13/20   Ladell Pier, MD  ?isosorbide dinitrate (ISORDIL) 30 MG tablet Take 1 tablet (30 mg total) by mouth 2 (two) times daily. 01/29/21 01/29/22  Richardson Dopp T, PA-C  ?LANTUS SOLOSTAR 100 UNIT/ML Solostar Pen ADMINISTER 20 UNITS UNDER THE SKIN DAILY 06/21/21   Ladell Pier, MD  ?nitroGLYCERIN (NITROSTAT) 0.4 MG SL tablet Place 1 tablet (0.4 mg total) under the tongue every 5 (five) minutes as needed for chest pain. 01/29/21   Richardson Dopp T, PA-C  ?Peritoneal Dialysis Solutions (DIALYSIS SOLUTION 2.5% LOW-MG/LOW-CA) 394 MOSM/L SOLN dianeal solution As per nephro 07/16/19   Nita Sells, MD  ?rosuvastatin (CRESTOR) 40 MG tablet Take 40 mg by mouth in the morning. 12/19/19   [provider]  ?senna (SENOKOT) 8.6 MG tablet Take 2 tablets (17.2 mg total) by mouth daily. ?Patient taking differently: Take 2 tablets by mouth daily as needed for constipation. 07/16/19   Nita Sells, MD  ?Vitamin D, Ergocalciferol, (DRISDOL) 1.25 MG (50000 UNIT) CAPS capsule Take 50,000 Units by mouth every Thursday. 12/01/20   [provider]  ?                                                                                                                                  ?Allergies ?Other and Lisinopril ? ?Review of Systems ?Review of Systems ?As noted in HPI ? ?Physical Exam ?Vital Signs  ?I have reviewed the triage vital signs ?BP 138/77   Pulse 78   Temp 98.7 ?F (37.1 ?C) (Oral)   Resp 19   Ht 5\' 10"  (1.778 m)   Wt 109.8 kg   SpO2 97%   BMI 34.73 kg/m?  ? ?Physical Exam ?Vitals reviewed.   ?Constitutional:   ?   General: He is not in acute distress. ?   Appearance: He is well-developed. He is not diaphoretic.  ?HENT:  ?   Head: Normocephalic and atraumatic.  ?   Nose: Nose normal.  ?Eyes:  ?   General: No scleral  icterus.    ?   Right eye: No discharge.     ?   Left eye: No discharge.  ?   Conjunctiva/sclera: Conjunctivae normal.  ?   Pupils: Pupils are equal, round, and reactive to light.  ?Cardiovascular:  ?   Rate and Rhythm: Normal rate. Rhythm regularly irregular.  ?   Heart sounds: No murmur heard. ?  No friction rub. No gallop.  ?   Arteriovenous access: Left arteriovenous access is present. ?   Comments: Left brachial AVF with good thrill ?Pulmonary:  ?   Effort: Pulmonary effort is normal. Tachypnea present. No respiratory distress.  ?   Breath sounds: No stridor. Examination of the left-middle field reveals rales. Examination of the right-lower field reveals rales. Examination of the left-lower field reveals rales. Rales present.  ?Abdominal:  ?   General: There is no distension.  ?   Palpations: Abdomen is soft.  ?   Tenderness: There is no abdominal tenderness.  ? ? ?Musculoskeletal:     ?   General: No tenderness.  ?   Cervical back: Normal range of motion and neck supple.  ?   Right lower leg: 1+ Pitting Edema present.  ?   Left lower leg: 1+ Pitting Edema present.  ?Skin: ?   General: Skin is warm and dry.  ?   Findings: No erythema or rash.  ?Neurological:  ?   Mental Status: He is alert and oriented to person, place, and time.  ? ? ?ED Results and Treatments ?Labs ?(all labs ordered are listed, but only abnormal results are displayed) ?Labs Reviewed  ?CBC WITH DIFFERENTIAL/PLATELET - Abnormal; Notable for the following components:  ?    Result Value  ? WBC 10.7 (*)   ? RBC 3.61 (*)   ? Hemoglobin 10.7 (*)   ? HCT 33.0 (*)   ? RDW 15.9 (*)   ? Neutro Abs 9.3 (*)   ? Lymphs Abs 0.5 (*)   ? All other components within normal limits  ?COMPREHENSIVE METABOLIC PANEL - Abnormal; Notable for  the following components:  ? Sodium 131 (*)   ? Potassium 6.4 (*)   ? Chloride 95 (*)   ? CO2 20 (*)   ? Glucose, Bld 109 (*)   ? BUN 126 (*)   ? Creatinine, Ser 21.98 (*)   ? Calcium 8.4 (*)   ? Total Protein 5

## 2021-07-14 NOTE — Consult Note (Signed)
[facility]  ?INPATIENT CONSULTATION ? ?Reason for Consultation: hyperkalemia, ESRD ?Requesting Provider: Dr. Hal Hope ? ?HPI: Donald [name] is an 51 y.o. male with ESRD on PD, DM,  CAD s/p CABG, HL, ESRD currently admitted for pneumonia and nephrology is consulted for evaluation and management of ESRD and hyperkalemia.  ? ?Pt presented to ED yesterday with fevers, chills, productive cough of sputum, N/V/watery diarrhea x 2 days duration.  CXR with BL opacities R > L.  Being treated for PNA with broad spectrum abx.  Labs also showing K 6.4 for which he was given lokelma; f/u K 6.2 this AM. ? ?Pt did HD for 2 years and has working LUE AVF.  Switched to PD about 3-4y ago in [place] then moved here and has followed with [facility] Dr. Candiss Norse at [facility].  He has not had any issues with PD lately and denies any breaches in technique, cloudy fluid, abd pain.  He last did PD Tues pm.   ? ?PMH: ?Past Medical History:  ?Diagnosis Date  ? CAD (coronary artery disease)   ? S/p CABG in 2016 in [place] //cath 2/22: Patent grafts; PDA/PLA branch is occluded beyond graft insertion; severe diffuse disease in native AV circumflex supplies small amount of myocardium-if refractory angina, PCI could be considered>> adequate therapy for now  ? Diabetes mellitus without complication ([place])   ? ESRD on peritoneal dialysis [facility])   ? dialysis   ? HFrEF (heart failure with reduced ejection fraction) ([place])   ? EF 35-40  ? History of vitrectomy 2014  ? anterior, left  ? Hx of heart bypass surgery   ? Hyperlipidemia   ? Hypertension   ? ?PSH: ?Past Surgical History:  ?Procedure Laterality Date  ? AV FISTULA PLACEMENT    ? CARDIAC SURGERY    ? COLONOSCOPY WITH PROPOFOL N/A 02/11/2020  ? Procedure: COLONOSCOPY WITH PROPOFOL;  Surgeon: Toledo, Benay Pike, MD;  Location: [facility] ENDOSCOPY;  Service: Gastroenterology;  Laterality: N/A;  ? CORONARY ARTERY BYPASS GRAFT  2016  ? ESOPHAGOGASTRODUODENOSCOPY (EGD) WITH PROPOFOL N/A  01/02/2020  ? Procedure: ESOPHAGOGASTRODUODENOSCOPY (EGD) WITH PROPOFOL;  Surgeon: Lesly Rubenstein, MD;  Location: [facility] ENDOSCOPY;  Service: Endoscopy;  Laterality: N/A;  ? INSERTION OF DIALYSIS CATHETER    ? left great toe amputation    ? LEFT HEART CATH AND CORS/GRAFTS ANGIOGRAPHY N/A 06/03/2020  ? Procedure: LEFT HEART CATH AND CORS/GRAFTS ANGIOGRAPHY;  Surgeon: Sherren Mocha, MD;  Location: [facility] CV LAB;  Service: Cardiovascular;  Laterality: N/A;  ? VITRECTOMY Left 2014  ? ? ?Past Medical History:  ?Diagnosis Date  ? CAD (coronary artery disease)   ? S/p CABG in 2016 in [place] //cath 2/22: Patent grafts; PDA/PLA branch is occluded beyond graft insertion; severe diffuse disease in native AV circumflex supplies small amount of myocardium-if refractory angina, PCI could be considered>> adequate therapy for now  ? Diabetes mellitus without complication ([place])   ? ESRD on peritoneal dialysis [facility])   ? dialysis   ? HFrEF (heart failure with reduced ejection fraction) ([place])   ? EF 35-40  ? History of vitrectomy 2014  ? anterior, left  ? Hx of heart bypass surgery   ? Hyperlipidemia   ? Hypertension   ? ? ?Medications: ? I have reviewed the patient's current medications. ? ?(Not in a hospital admission) ? ? ?ALLERGIES: ?  ?Allergies  ?Allergen Reactions  ? Other Swelling  ?  Other reaction(s): EGGPLANT ?Facial swelling  ? Lisinopril Hives  ? ? ?  FAM HX: ?Family History  ?Problem Relation Age of Onset  ? Diabetes Mellitus II Mother   ? Heart disease Mother   ? Diabetes Mother   ? Hypertension Mother   ? Kidney failure Mother   ? Diabetes Maternal Grandmother   ? Hyperlipidemia Maternal Grandmother   ? Dementia Maternal Grandmother   ? ? ?Social History:  ? reports that he has never smoked. He has never used smokeless tobacco. He reports current alcohol use. He reports current drug use. Drug: Marijuana. ? ?ROS: 12 system ROS neg except as noted above in HPI ? ?Blood pressure (!) 142/69, pulse 71,  temperature 98.7 ?F (37.1 ?C), temperature source Oral, resp. rate (!) 21, height 5\' 10"  (1.778 m), weight 109.8 kg, SpO2 90 %. ?PHYSICAL EXAM: ?Gen: lying on side on stretcher resting comfortably  ?Eyes: anicteric, EOMI ?ENT: MM tacky ?Neck: supple ?CV:  RRR, no rub ?Abd:  soft, nontender, RLQ PD catheter exit site clear, tegaderm covering ?Back: scattered rhonchi, normal WOB, frequent coughing ?GU: no foley ?Extr:  no edema, LUE AVF +t/b ?Neuro: nonfocal ? ?  ?Results for orders placed or performed during the hospital encounter of 07/13/21 (from the past 48 hour(s))  ?Resp Panel by RT-PCR (Flu A&B, Covid) Nasopharyngeal Swab     Status: None  ? Collection Time: 07/13/21  8:42 PM  ? Specimen: Nasopharyngeal Swab; Nasopharyngeal(NP) swabs in vial transport medium  ?Result Value Ref Range  ? SARS Coronavirus 2 by RT PCR NEGATIVE NEGATIVE  ?  Comment: (NOTE) ?SARS-CoV-2 target nucleic acids are NOT DETECTED. ? ?The SARS-CoV-2 RNA is generally detectable in upper respiratory ?specimens during the acute phase of infection. The lowest ?concentration of SARS-CoV-2 viral copies this assay can detect is ?138 copies/mL. A negative result does not preclude SARS-Cov-2 ?infection and should not be used as the sole basis for treatment or ?other patient management decisions. A negative result may occur with  ?improper specimen collection/handling, submission of specimen other ?than nasopharyngeal swab, presence of viral mutation(s) within the ?areas targeted by this assay, and inadequate number of viral ?copies(<138 copies/mL). A negative result must be combined with ?clinical observations, patient history, and epidemiological ?information. The expected result is Negative. ? ?Fact Sheet for Patients:  ?EntrepreneurPulse.com.au ? ?Fact Sheet for Healthcare Providers:  ?IncredibleEmployment.be ? ?This test is no t yet approved or cleared by the [place] FDA and  ?has been authorized for  detection and/or diagnosis of SARS-CoV-2 by ?FDA under an Emergency Use Authorization (EUA). This EUA will remain  ?in effect (meaning this test can be used) for the duration of the ?COVID-19 declaration under Section 564(b)(1) of the Act, 21 ?U.S.C.section 360bbb-3(b)(1), unless the authorization is terminated  ?or revoked sooner.  ? ? ?  ? Influenza A by PCR NEGATIVE NEGATIVE  ? Influenza B by PCR NEGATIVE NEGATIVE  ?  Comment: (NOTE) ?The Xpert Xpress SARS-CoV-2/FLU/RSV plus assay is intended as an aid ?in the diagnosis of influenza from Nasopharyngeal swab specimens and ?should not be used as a sole basis for treatment. Nasal washings and ?aspirates are unacceptable for Xpert Xpress SARS-CoV-2/FLU/RSV ?testing. ? ?Fact Sheet for Patients: ?EntrepreneurPulse.com.au ? ?Fact Sheet for Healthcare Providers: ?IncredibleEmployment.be ? ?This test is not yet approved or cleared by the [place] FDA and ?has been authorized for detection and/or diagnosis of SARS-CoV-2 by ?FDA under an Emergency Use Authorization (EUA). This EUA will remain ?in effect (meaning this test can be used) for the duration of the ?COVID-19 declaration under Section 564(b)(1) of  the Act, 21 U.S.C. ?section 360bbb-3(b)(1), unless the authorization is terminated or ?revoked. ? ?Performed at [facility], [place] [address] [place], [place] ?[address] ?  ?CBC with Differential     Status: Abnormal  ? Collection Time: 07/13/21  8:50 PM  ?Result Value Ref Range  ? WBC 10.7 (H) 4.0 - 10.5 K/uL  ? RBC 3.61 (L) 4.22 - 5.81 MIL/uL  ? Hemoglobin 10.7 (L) 13.0 - 17.0 g/dL  ? HCT 33.0 (L) 39.0 - 52.0 %  ? MCV 91.4 80.0 - 100.0 fL  ? MCH 29.6 26.0 - 34.0 pg  ? MCHC 32.4 30.0 - 36.0 g/dL  ? RDW 15.9 (H) 11.5 - 15.5 %  ? Platelets 159 150 - 400 K/uL  ? nRBC 0.0 0.0 - 0.2 %  ? Neutrophils Relative % 87 %  ? Neutro Abs 9.3 (H) 1.7 - 7.7 K/uL  ? Lymphocytes Relative 5 %  ? Lymphs Abs 0.5 (L) 0.7 - 4.0 K/uL  ? Monocytes  Relative 6 %  ? Monocytes Absolute 0.7 0.1 - 1.0 K/uL  ? Eosinophils Relative 1 %  ? Eosinophils Absolute 0.1 0.0 - 0.5 K/uL  ? Basophils Relative 0 %  ? Basophils Absolute 0.0 0.0 - 0.1 K/uL  ? Immature Granulocytes 1 %

## 2021-07-15 ENCOUNTER — Inpatient Hospital Stay (HOSPITAL_COMMUNITY): Payer: Medicare Other

## 2021-07-15 DIAGNOSIS — R9431 Abnormal electrocardiogram [ECG] [EKG]: Secondary | ICD-10-CM

## 2021-07-15 LAB — GASTROINTESTINAL PANEL BY PCR, STOOL (REPLACES STOOL CULTURE)

## 2021-07-15 LAB — CBC WITH DIFFERENTIAL/PLATELET
Abs Immature Granulocytes: 0.03 10*3/uL (ref 0.00–0.07)
Basophils Absolute: 0 10*3/uL (ref 0.0–0.1)
Basophils Relative: 0 %
Eosinophils Absolute: 0.3 10*3/uL (ref 0.0–0.5)
Eosinophils Relative: 4 %
HCT: 28.8 % — ABNORMAL LOW (ref 39.0–52.0)
Hemoglobin: 9.7 g/dL — ABNORMAL LOW (ref 13.0–17.0)
Immature Granulocytes: 0 %
Lymphocytes Relative: 13 %
Lymphs Abs: 0.9 10*3/uL (ref 0.7–4.0)
MCH: 30 pg (ref 26.0–34.0)
MCHC: 33.7 g/dL (ref 30.0–36.0)
MCV: 89.2 fL (ref 80.0–100.0)
Monocytes Absolute: 0.6 10*3/uL (ref 0.1–1.0)
Monocytes Relative: 8 %
Neutro Abs: 5.4 10*3/uL (ref 1.7–7.7)
Neutrophils Relative %: 75 %
Platelets: 133 10*3/uL — ABNORMAL LOW (ref 150–400)
RBC: 3.23 MIL/uL — ABNORMAL LOW (ref 4.22–5.81)
RDW: 15.6 % — ABNORMAL HIGH (ref 11.5–15.5)
WBC: 7.2 10*3/uL (ref 4.0–10.5)
nRBC: 0 % (ref 0.0–0.2)

## 2021-07-15 LAB — EXPECTORATED SPUTUM ASSESSMENT W GRAM STAIN, RFLX TO RESP C

## 2021-07-15 LAB — BASIC METABOLIC PANEL
Anion gap: 9 (ref 5–15)
BUN: 67 mg/dL — ABNORMAL HIGH (ref 6–20)
CO2: 27 mmol/L (ref 22–32)
Calcium: 8.1 mg/dL — ABNORMAL LOW (ref 8.9–10.3)
Chloride: 99 mmol/L (ref 98–111)
Creatinine, Ser: 13.83 mg/dL — ABNORMAL HIGH (ref 0.61–1.24)
GFR, Estimated: 4 mL/min — ABNORMAL LOW (ref >60)
Glucose, Bld: 107 mg/dL — ABNORMAL HIGH (ref 70–99)
Potassium: 3.9 mmol/L (ref 3.5–5.1)
Sodium: 135 mmol/L (ref 135–145)

## 2021-07-15 LAB — HEPATITIS B SURFACE ANTIBODY, QUANTITATIVE: Hep B S AB Quant (Post): 83.8 m[IU]/mL (ref >9.9)

## 2021-07-15 LAB — ECHOCARDIOGRAM COMPLETE
Area-P 1/2: 3.91 cm2
Height: 70 in
S' Lateral: 4.1 cm
Weight: 3830.71 oz

## 2021-07-15 LAB — GLUCOSE, CAPILLARY
Glucose-Capillary: 92 mg/dL (ref 70–99)
Glucose-Capillary: 65 mg/dL — ABNORMAL LOW (ref 70–99)
Glucose-Capillary: 73 mg/dL (ref 70–99)
Glucose-Capillary: 79 mg/dL (ref 70–99)
Glucose-Capillary: 119 mg/dL — ABNORMAL HIGH (ref 70–99)

## 2021-07-15 LAB — PATHOLOGIST SMEAR REVIEW

## 2021-07-15 MED ORDER — GENTAMICIN SULFATE 0.1 % EX CREA
1.0000 "application " | TOPICAL_CREAM | Freq: Every day | CUTANEOUS | Status: DC
  Filled 2021-07-15: qty 15

## 2021-07-15 MED ORDER — DELFLEX-LC/1.5% DEXTROSE 344 MOSM/L IP SOLN: INTRAPERITONEAL | Status: DC

## 2021-07-15 MED ORDER — MELATONIN 5 MG PO TABS
10.0000 mg | ORAL_TABLET | Freq: Every evening | ORAL | Status: DC | PRN
  Administered 2021-07-15: 10 mg via ORAL
  Filled 2021-07-15: qty 2

## 2021-07-15 MED ORDER — BENZONATATE 100 MG PO CAPS
100.0000 mg | ORAL_CAPSULE | Freq: Three times a day (TID) | ORAL | Status: DC
  Administered 2021-07-15 – 2021-07-16 (×5): 100 mg via ORAL
  Filled 2021-07-15 (×5): qty 1

## 2021-07-15 MED ORDER — INSULIN GLARGINE-YFGN 100 UNIT/ML ~~LOC~~ SOLN
15.0000 [IU] | Freq: Every day | SUBCUTANEOUS | Status: DC
  Administered 2021-07-15: 15 [IU] via SUBCUTANEOUS
  Filled 2021-07-15 (×2): qty 0.15

## 2021-07-15 MED ORDER — DELFLEX-LC/2.5% DEXTROSE 394 MOSM/L IP SOLN: INTRAPERITONEAL | Status: DC

## 2021-07-15 NOTE — Progress Notes (Signed)
?  [facility] ?Progress Note  ? ?Assessment/ Plan:   ? ?**pneumonia: evidenced by symptoms and CXR; receiving Abx per primary with azithro and CTX  ?  ?**Hyperkalemia:  K 6.4 > 6.2 with lokelma.  HD 07/14/21 and resume PD tonight ?  ?**ESRD on PD:  resume PD tonight, re-send culture for completeness (doubt peritonitis) ?  ?**HTN:  cont home meds per primary.  Hold this AMs dose due to upcoming HD and BPs not runnign high ?  ?**Anemia:  Hb in 11s.   ?  ?**BMM:  cont outpt binder. Can check phos if he remains admitted.  ?  ? ?Subjective:   ? ?Seen in room.  Got HD yesterday, did well.  Fluid culture drawn without a dwell so is inaccurate.  ? ?Objective:   ?BP 138/80 (BP Location: Right Arm)   Pulse 77   Temp 98.6 ?F (37 ?C) (Oral)   Resp 18   Ht 5\' 10"  (1.778 m)   Wt 108.6 kg   SpO2 95%   BMI 34.35 kg/m?  ? ?Physical Exam: ?Gen:NAD, sitting in bed, getting TTE ?CVS: RRR ?Resp: clear anteriorly ?Abd: soft, PD cath in RLQ, excellent ?Ext: trace LE edema ? ?Labs: ?BMET ?Recent Labs  ?Lab 07/13/21 ?2050 07/14/21 ?2025 07/15/21 ?4270  ?NA 131* 131* 135  ?K 6.4* 6.2* 3.9  ?CL 95* 95* 99  ?CO2 20* 19* 27  ?GLUCOSE 109* 94 107*  ?BUN 126* 134* 67*  ?CREATININE 21.98* 21.94* 13.83*  ?CALCIUM 8.4* 8.5* 8.1*  ? ?CBC ?Recent Labs  ?Lab 07/13/21 ?2050 07/14/21 ?6237 07/15/21 ?6283  ?WBC 10.7* 13.0* 7.2  ?NEUTROABS 9.3* 11.1* 5.4  ?HGB 10.7* 11.1* 9.7*  ?HCT 33.0* 33.4* 28.8*  ?MCV 91.4 90.8 89.2  ?PLT 159 137* 133*  ? ? ?  ?Medications:   ? ? aspirin EC  81 mg Oral Daily  ? benzonatate  100 mg Oral TID  ? calcium acetate  2,001 mg Oral TID with meals  ? carvedilol  12.5 mg Oral BID WC  ? Chlorhexidine Gluconate Cloth  6 each Topical Q0600  ? clopidogrel  75 mg Oral Daily  ? ezetimibe  10 mg Oral Daily  ? heparin  5,000 Units Subcutaneous Q8H  ? insulin aspart  0-6 Units Subcutaneous TID WC  ? insulin glargine-yfgn  15 Units Subcutaneous Daily  ? isosorbide dinitrate  30 mg Oral BID  ? rosuvastatin  40 mg Oral  Daily  ? ? ? ? Madelon Lips MD ?07/15/2021, 2:43 PM   ?

## 2021-07-15 NOTE — Progress Notes (Signed)
?PROGRESS NOTE ? ?Donald [name]  ?DOB: Jul 19, 1970  ?PCP: Ladell Pier, MD ?ELF:[id number]  ?DOA: 07/13/2021 ? LOS: 1 day  ?Hospital Day: 3 ? ?Brief narrative: ?Donald [name] is a 51 y.o. male with PMH significant for ESRD on PD, CAD/CABG, chronic systolic CHF, DM 2, HTN, HLD. ?Patient presented to the ED on 07/13/2021 with complaint of persistent productive cough for 2 days associated with nausea, vomiting and diarrhea. ? ?In the ED, patient was noted to have a temperature of 101.8 ?Labs showed sodium low at 131, potassium elevated to 6.4, serum bicarb 20, WBC count 10.7, hemoglobin 10.7 ?Chest x-ray showed cardiomegaly with progressive vascular congestion, symmetric perihilar opacities right greater than left. ?COVID and flu PCR negative ?Admitted to hospitalist service ? ? ?Subjective: ?Patient was seen and examined this morning.   ?Lying down in bed.  Complains of nocturnal cough and could not sleep well last night.  Underwent hemodialysis yesterday.   ?Had 1 episode of loose bowel movement this morning. ? ?Principal Problem: ?  Multifocal pneumonia ?Active Problems: ?  Type 2 diabetes mellitus with peripheral neuropathy ([place]) ?  ESRD on peritoneal dialysis [facility]) ?  HLD (hyperlipidemia) ?  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  History of amputation of left great toe ([place]) ?  Chronic combined systolic (congestive) and diastolic (congestive) heart failure (HCC) ?  Anemia due to chronic kidney disease, on chronic dialysis ([place]) ?  ? ?Assessment and Plan: ?Community-acquired pneumonia ?-Presented with productive cough, mostly nocturnal ?-Chest x-ray with multifocal opacities ?-Currently on broad-spectrum coverage with IV Rocephin and azithromycin. ?-Pending report of blood culture and sputum culture. ? ?History of chronic combined systolic and diastolic CHF ?-Echocardiogram from February 2022 with EF 35 to 40%, regional wall motion abnormalities, moderate LVH,  grade 1 diastolic dysfunction.  Repeat echo pending. ?-Volume status stable at this time ?-Continue Coreg 12.5 mg twice daily, Imdur 30 mg twice daily.   ?-Continue to monitor blood pressure and CHF symptoms. ? ?Nausea, vomiting, diarrhea ?-Benign abdomen on exam.  1 episode of loose bowel movement this morning.  No vomiting since admission.  -Currently able to tolerate solid diet. ? ?ESRD on peritoneal dialysis  ?Hyperkalemia, hyponatremia ?-Potassium level elevated to 6.4 initially.  Improving after Milly Jakob was given. ?-Nephrology consult appreciated ?Recent Labs  ?Lab 07/13/21 ?2050 07/14/21 ?5852 07/15/21 ?7782  ?NA 131* 131* 135  ?K 6.4* 6.2* 3.9  ?CL 95* 95* 99  ?CO2 20* 19* 27  ?GLUCOSE 109* 94 107*  ?BUN 126* 134* 67*  ?CREATININE 21.98* 21.94* 13.83*  ?CALCIUM 8.4* 8.5* 8.1*  ? ? ?Type 2 diabetes mellitus ?-A1c 6.4 on February 2022 ?-Home meds include Lantus 20 units in the morning and Premeal NovoLog 6 units 3 times daily.  Premeal sliding scale insulin ?-Currently continued on the same.  Blood sugar level low at 65 this morning.  We will cut back on Semglee to 15 units. ?Recent Labs  ?Lab 07/14/21 ?1818 07/14/21 ?2104 07/15/21 ?4235 07/15/21 ?1129 07/15/21 ?1212  ?GLUCAP 110* 136* 92 65* 73  ? ? ?CAD/CABG  ?-Denies chest pain.  Mild elevated troponin in the setting of ESRD ?-Continue aspirin, Plavix, Zetia, beta-blocker and isosorbide. ?Recent Labs  ?  07/14/21 ?0506 07/14/21 ?0810  ?TROPONINIHS 99* 97*  ? ? ?Goals of care ?  Code Status: Full Code  ? ? ?Mobility: Encourage ambulation.  Independent prior to presentation ? ?Nutritional status:  ?Body mass index is 34.35 kg/m?.  ?  ?  ? ? ? ? ?  Diet:  ?Diet Order   ? ?       ?  Diet renal/carb modified with fluid restriction Diet-HS Snack? Nothing; Fluid restriction: 1200 mL Fluid; Room service appropriate? Yes; Fluid consistency: Thin  Diet effective now       ?  ? ?  ?  ? ?  ? ? ?DVT prophylaxis:  ?heparin injection 5,000 Units Start: 07/14/21 0600 ?   ?Antimicrobials: IV Rocephin, azithromycin ?Fluid: None ?Consultants: Nephrology ?Family Communication: None at bedside ? ?Status is: Inpatient ? ?Continue in-hospital care because: IV antibiotics, ?Level of care: Telemetry Medical  ? ?Dispo: The patient is from: Home ?             Anticipated d/c is to: Hopefully home in 1 to 2 days ?             Patient currently is not medically stable to d/c. ?  Difficult to place patient No ? ? ? ? ?Infusions:  ? azithromycin Stopped (07/14/21 2204)  ? cefTRIAXone (ROCEPHIN)  IV Stopped (07/14/21 1949)  ? ? ?Scheduled Meds: ? aspirin EC  81 mg Oral Daily  ? benzonatate  100 mg Oral TID  ? calcium acetate  2,001 mg Oral TID with meals  ? carvedilol  12.5 mg Oral BID WC  ? Chlorhexidine Gluconate Cloth  6 each Topical Q0600  ? clopidogrel  75 mg Oral Daily  ? ezetimibe  10 mg Oral Daily  ? heparin  5,000 Units Subcutaneous Q8H  ? insulin aspart  0-6 Units Subcutaneous TID WC  ? insulin glargine-yfgn  20 Units Subcutaneous Daily  ? isosorbide dinitrate  30 mg Oral BID  ? rosuvastatin  40 mg Oral Daily  ? ? ?PRN meds: ?acetaminophen **OR** acetaminophen, calcium acetate, guaiFENesin, nitroGLYCERIN  ? ?Antimicrobials: ?Anti-infectives (From admission, onward)  ? ? Start     Dose/Rate Route Frequency Ordered Stop  ? 07/14/21 1600  cefTRIAXone (ROCEPHIN) 2 g in sodium chloride 0.9 % 100 mL IVPB       ? 2 g ?200 mL/hr over 30 Minutes Intravenous Every 24 hours 07/14/21 0257 07/19/21 1559  ? 07/14/21 1600  azithromycin (ZITHROMAX) 500 mg in sodium chloride 0.9 % 250 mL IVPB       ? 500 mg ?250 mL/hr over 60 Minutes Intravenous Every 24 hours 07/14/21 0257 07/19/21 1559  ? 07/13/21 2345  cefTRIAXone (ROCEPHIN) 1 g in sodium chloride 0.9 % 100 mL IVPB       ? 1 g ?200 mL/hr over 30 Minutes Intravenous  Once 07/13/21 2341 07/14/21 0109  ? 07/13/21 2345  azithromycin (ZITHROMAX) 500 mg in sodium chloride 0.9 % 250 mL IVPB       ? 500 mg ?250 mL/hr over 60 Minutes Intravenous  Once 07/13/21  2341 07/14/21 0155  ? ?  ? ? ?Objective: ?Vitals:  ? 07/15/21 0500 07/15/21 0811  ?BP:  138/80  ?Pulse:  77  ?Resp:  18  ?Temp: 98.6 ?F (37 ?C)   ?SpO2:  95%  ? ? ?Intake/Output Summary (Last 24 hours) at 07/15/2021 1414 ?Last data filed at 07/15/2021 1300 ?Gross per 24 hour  ?Intake 1190 ml  ?Output 1001 ml  ?Net 189 ml  ? ?Filed Weights  ? 07/13/21 2033 07/14/21 1324 07/14/21 1819  ?Weight: 109.8 kg 107.3 kg 108.6 kg  ? ?Weight change: -2.5 kg ?Body mass index is 34.35 kg/m?.  ? ?Physical Exam: ?General exam: Pleasant, middle-aged African-American male.  Sleepy.  Not in physical distress ?Skin: No  rashes, lesions or ulcers. ?HEENT: Atraumatic, normocephalic, no obvious bleeding ?Lungs: Clear to auscultation bilaterally ?CVS: Regular rate and rhythm, no murmur ?GI/Abd soft, nontender, nondistended, bowel sound present ?CNS: Sleepy, opens eyes on verbal command, oriented x3 ?Psychiatry: Mood appropriate ?Extremities: No pedal edema, no calf tenderness ? ?Data Review: I have personally reviewed the laboratory data and studies available. ? ?F/u labs ordered ?Unresulted Labs (From admission, onward)  ? ?  Start     Ordered  ? 07/14/21 1837  Pathologist smear review  Once,   R       ? 07/14/21 1837  ? 07/14/21 0257  CBC  (heparin)  Once,   R       ?Comments: Baseline for heparin therapy IF NOT ALREADY DRAWN.  Notify MD if PLT < 100 K. ?  ? 07/14/21 0258  ? 07/14/21 0256  Expectorated Sputum Assessment w Gram Stain, Rflx to Resp Cult  Once,   R       ? 07/14/21 0257  ? 07/13/21 2340  Blood culture (routine x 2)  BLOOD CULTURE X 2,   STAT     ? 07/13/21 2341  ? ?  ?  ? ?  ? ? ?Signed, ?Terrilee Croak, MD ?[facility] ?07/15/2021 ? ? ? ? ? ? ? ? ? ? ? ? ?

## 2021-07-15 NOTE — Progress Notes (Signed)
?  Echocardiogram ?2D Echocardiogram has been performed. ? ?Donald [name] ?07/15/2021, 2:48 PM ?

## 2021-07-16 DIAGNOSIS — Z992 Dependence on renal dialysis: Secondary | ICD-10-CM | POA: Diagnosis not present

## 2021-07-16 DIAGNOSIS — N186 End stage renal disease: Secondary | ICD-10-CM | POA: Diagnosis not present

## 2021-07-16 LAB — GLUCOSE, CAPILLARY
Glucose-Capillary: 65 mg/dL — ABNORMAL LOW (ref 70–99)
Glucose-Capillary: 89 mg/dL (ref 70–99)
Glucose-Capillary: 83 mg/dL (ref 70–99)

## 2021-07-16 MED ORDER — AZITHROMYCIN 500 MG PO TABS: 500.0000 mg | ORAL_TABLET | Freq: Every day | ORAL | 0 refills | Status: AC

## 2021-07-16 MED ORDER — CEFDINIR 300 MG PO CAPS: 300.0000 mg | ORAL_CAPSULE | Freq: Two times a day (BID) | ORAL | 0 refills | Status: DC

## 2021-07-16 MED ORDER — BENZONATATE 100 MG PO CAPS: 100.0000 mg | ORAL_CAPSULE | Freq: Three times a day (TID) | ORAL | 0 refills | Status: AC

## 2021-07-16 MED ORDER — AZITHROMYCIN 500 MG PO TABS
500.0000 mg | ORAL_TABLET | Freq: Every day | ORAL | Status: DC
  Administered 2021-07-16: 500 mg via ORAL
  Filled 2021-07-16: qty 1

## 2021-07-16 MED ORDER — GUAIFENESIN 100 MG/5ML PO LIQD
5.0000 mL | ORAL | 0 refills | Status: AC | PRN
Start: 2021-07-16 — End: 2021-07-23

## 2021-07-16 MED ORDER — INSULIN GLARGINE-YFGN 100 UNIT/ML ~~LOC~~ SOLN
10.0000 [IU] | Freq: Every day | SUBCUTANEOUS | Status: DC
  Filled 2021-07-16: qty 0.1

## 2021-07-16 NOTE — Progress Notes (Signed)
DISCHARGE NOTE HOME ?[name] to be discharged Home per MD order. Discussed prescriptions and follow up appointments with the patient. Prescriptions given to patient; medication list explained in detail. Patient verbalized understanding. ? ?Skin clean, dry and intact without evidence of skin break down, no evidence of skin tears noted. IV catheter discontinued intact. Site without signs and symptoms of complications. Dressing and pressure applied. Pt denies pain at the site currently. No complaints noted. ? ?Patient free of lines, drains, and wounds.  ? ?An After Visit Summary (AVS) was printed and given to the patient. ?Patient escorted via wheelchair, and discharged home via private auto. ? ?Rogelia Mire, RN  ?

## 2021-07-16 NOTE — Discharge Summary (Signed)
? ?Physician Discharge Summary  ?Donald [name]:[id number] DOB: 11-Feb-1971 DOA: 07/13/2021 ? ?PCP: Ladell Pier, MD ? ?Admit date: 07/13/2021 ?Discharge date: 07/16/2021 ? ?Admitted From: Home ?Discharge disposition: Home ? ?Recommendations at discharge:  ?Complete the course of antibiotics ?Continue peritoneal dialysis. ? ?Brief narrative: ?Donald [name] is a 51 y.o. male with PMH significant for ESRD on PD, CAD/CABG, chronic systolic CHF, DM 2, HTN, HLD. ?Patient presented to the ED on 07/13/2021 with complaint of persistent productive cough for 2 days associated with nausea, vomiting and diarrhea. ? ?In the ED, patient was noted to have a temperature of 101.8 ?Labs showed sodium low at 131, potassium elevated to 6.4, serum bicarb 20, WBC count 10.7, hemoglobin 10.7 ?Chest x-ray showed cardiomegaly with progressive vascular congestion, symmetric perihilar opacities right greater than left. ?COVID and flu PCR negative ?Admitted to hospitalist service ? ? ?Subjective: ?Patient was seen and examined this morning.   ?Lying down in bed.  Complains of nocturnal cough and could not sleep well last night.  Underwent hemodialysis yesterday.   ?Had 1 episode of loose bowel movement this morning. ? ?Principal Problem: ?  Multifocal pneumonia ?Active Problems: ?  Type 2 diabetes mellitus with peripheral neuropathy ([place]) ?  ESRD on peritoneal dialysis [facility]) ?  HLD (hyperlipidemia) ?  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  History of amputation of left great toe ([place]) ?  Chronic combined systolic (congestive) and diastolic (congestive) heart failure (HCC) ?  Anemia due to chronic kidney disease, on chronic dialysis [facility]) ?  ? ?Hospital course: ?Community-acquired pneumonia ?-Presented with productive cough, mostly nocturnal ?-Chest x-ray with multifocal opacities ?-Treated with broad-spectrum antibiotics -IV Rocephin and azithromycin. ?-No growth in blood culture, sputum  culture. ?-Discharge on 5 more days of oral Augmentin.  Also continue Mucinex, Tessalon Perles ? ?History of chronic combined systolic and diastolic CHF ?-Echocardiogram 4/7 showed an EF of 40 to 45%, LV global hypokinesis, mild concentric LVH, mildly elevated pulm artery systolic pressure.  EF is better than 35 to 40% from a year ago. ?-Volume status stable at this time ?-Continue Coreg 12.5 mg twice daily, Imdur 30 mg twice daily.   ? ?Nausea, vomiting, diarrhea ?-Benign abdomen on exam. ?-No longer having vomiting or diarrhea. ? ?ESRD on peritoneal dialysis  ?Hyperkalemia, hyponatremia ?-Potassium level elevated to 6.4 initially.  Improving after Milly Jakob was given. ?-Nephrology consult appreciated.  Patient was given 1 session of hemodialysis and resumed on nightly peritoneal dialysis.  Electrolyte levels improved. ?Recent Labs  ?Lab 07/13/21 ?2050 07/14/21 ?8938 07/15/21 ?1017  ?NA 131* 131* 135  ?K 6.4* 6.2* 3.9  ?CL 95* 95* 99  ?CO2 20* 19* 27  ?GLUCOSE 109* 94 107*  ?BUN 126* 134* 67*  ?CREATININE 21.98* 21.94* 13.83*  ?CALCIUM 8.4* 8.5* 8.1*  ? ?Type 2 diabetes mellitus ?-A1c 6.4 on February 2022 ?-Home meds include Lantus 20 units in the morning and Premeal NovoLog 6 units 3 times daily.  Premeal sliding scale insulin ?-Continue the same. ?Recent Labs  ?Lab 07/15/21 ?1634 07/15/21 ?2119 07/16/21 ?0734 07/16/21 ?0827 07/16/21 ?1145  ?GLUCAP 79 119* 65* 89 83  ? ?CAD/CABG  ?-Denies chest pain.  Mild elevated troponin in the setting of ESRD ?-Continue aspirin, Plavix, Zetia, beta-blocker and isosorbide. ?Recent Labs  ?  07/14/21 ?0506 07/14/21 ?0810  ?TROPONINIHS 99* 97*  ? ?Goals of care ?  Code Status: Full Code  ? ? ?Mobility: Encourage ambulation.  Independent prior to presentation ? ? ? ?  Wounds:  ?- ?  ? ?Discharge Exam:  ? ?Vitals:  ? 07/15/21 2120 07/16/21 0528 07/16/21 0725 07/16/21 1007  ?BP: 130/65 133/74 (!) 144/80 (!) 156/96  ?Pulse: 75 78 74 72  ?Resp: 18 18 18 18   ?Temp: (!) 97.5 ?F (36.4 ?C)  98.7 ?F (37.1 ?C) 98.1 ?F (36.7 ?C) 98.2 ?F (36.8 ?C)  ?TempSrc: Oral Oral    ?SpO2: 100% 98%  95%  ?Weight:      ?Height:      ? ? ?Body mass index is 35.11 kg/m?.  ?General exam: Pleasant, middle-aged African-American male.  Sleepy.  Not in physical distress ?Skin: No rashes, lesions or ulcers. ?HEENT: Atraumatic, normocephalic, no obvious bleeding ?Lungs: Clear to auscultation bilaterally ?CVS: Regular rate and rhythm, no murmur ?GI/Abd soft, nontender, nondistended, bowel sound present ?CNS: Sleepy, opens eyes on verbal command, oriented x3 ?Psychiatry: Mood appropriate ?Extremities: No pedal edema, no calf tenderness ? ?Follow ups:  ? ? Follow-up Information   ? ? Ladell Pier, MD Follow up.   ?Specialty: Internal Medicine ?Contact information: ?[address] ?[address] ?[place] [address] ?[phone] ? ? ?  ?  ? ? Donato Heinz, MD .   ?Specialties: Cardiology, Radiology ?Contact information: ?[address] ?[address] ?[place] [address] ?[phone] ? ? ?  ?  ? ?  ?  ? ?  ? ? ?Discharge Instructions:  ? ?Discharge Instructions   ? ? Call MD for:  difficulty breathing, headache or visual disturbances   Complete by: As directed ?  ? Call MD for:  extreme fatigue   Complete by: As directed ?  ? Call MD for:  hives   Complete by: As directed ?  ? Call MD for:  persistant dizziness or light-headedness   Complete by: As directed ?  ? Call MD for:  persistant nausea and vomiting   Complete by: As directed ?  ? Call MD for:  severe uncontrolled pain   Complete by: As directed ?  ? Call MD for:  temperature >100.4   Complete by: As directed ?  ? Diet general   Complete by: As directed ?  ? Discharge instructions   Complete by: As directed ?  ? Recommendations at discharge:  ?? Complete the course of antibiotics ?? Continue peritoneal dialysis. ? ?Discharge instructions for diabetes mellitus: ?Check blood sugar 3 times a day and bedtime at home. ?If blood sugar running above 200 or less  than 70 please call your MD to adjust insulin. ?If you notice signs and symptoms of hypoglycemia (low blood sugar) like jitteriness, confusion, thirst, tremor and sweating, please check blood sugar, drink sugary drink/biscuits/sweets to increase sugar level and call MD or return to ER. ? ? ? ?Discharge instructions for CHF ?Check weight daily -preferably same time every day. ?Restrict fluid intake to 1200 ml daily ?Restrict salt intake to less than 2 g daily. ?Call MD if you have one of the following symptoms ?1) 3 pound weight gain in 24 hours or 5 pounds in 1 week  ?2) swelling in the hands, feet or stomach  ?3) progressive shortness of breath ?4) if you have to sleep on extra pillows at night in order to breathe ? ? ? ? ?General discharge instructions: ?Follow with Primary MD Ladell Pier, MD in 7 days  ?Please request your PCP  to go over your hospital tests, procedures, radiology results at the follow up. Please get your medicines reviewed and adjusted.  Your PCP  may decide to repeat certain labs or tests as needed. ?Do not drive, operate heavy machinery, perform activities at heights, swimming or participation in water activities or provide baby sitting services if your were admitted for syncope or siezures until you have seen by Primary MD or a Neurologist and advised to do so again. ?[place] Controlled Substance Reporting System database was reviewed. Do not drive, operate heavy machinery, perform activities at heights, swim, participate in water activities or provide baby-sitting services while on medications for pain, sleep and mood until your outpatient physician has reevaluated you and advised to do so again.  You are strongly recommended to comply with the dose, frequency and duration of prescribed medications. ?Activity: As tolerated with Full fall precautions use walker/cane & assistance as needed ?Avoid using any recreational substances like cigarette, tobacco, alcohol, or non-prescribed  drug. ?If you experience worsening of your admission symptoms, develop shortness of breath, life threatening emergency, suicidal or homicidal thoughts you must seek medical attention immediately by calling

## 2021-07-16 NOTE — Progress Notes (Signed)
This patient is receiving the antibiotic azithromycin by the intravenous route.  ? ?Based on criteria approved by the Pharmacy and Therapeutics Committee, and the  Infectious Disease Division, the antibiotic is being converted to equivalent oral dose form(s). These criteria include:  ?Patient being treated for a respiratory tract infection, urinary tract infection,  ?cellulitis, or Clostridium Difficile associated diarrhea  ?The patient is not neutropenic and does not exhibit a GI malabsorption state  ?The patient is eating (either orally or per tube) and/or has been taking other  ?orally administered medications for at least 24 hours.  ?The patient is improving clinically (physician assessment and a 24-hour Tmax  ?of 100.5 F).  ? ?If you have questions about this conversion, please contact the pharmacy department.  ? ?Thank you for allowing pharmacy to be a part of this patient?s care. ? ?Ardyth Harps, PharmD ?Clinical Pharmacist ? ?

## 2021-07-16 NOTE — Progress Notes (Signed)
?  [facility] ?Progress Note  ? ?Assessment/ Plan:   ? ?**pneumonia: evidenced by symptoms and CXR; receiving Abx per primary with azithro and CTX  ?  ?**Hyperkalemia:  K 6.4 > 6.2 with lokelma.  HD 07/14/21, resumed PD last night without incident- will reorder in case still here tonight ?  ?**ESRD on PD:  resumed PD, re-send culture for completeness (doubt peritonitis)--> cell ct was drawn incorrectly ?  ?**HTN:  cont home meds per primary.  Hold this AMs dose due to upcoming HD and BPs not runnign high ?  ?**Anemia:  Hb in 11s.   ?  ?**BMM:  cont outpt binder. Can check phos if he remains admitted.  ?  ? ?Subjective:   ? PD yesterday without incident.  Feeling better.  ? ?Objective:   ?BP (!) 156/96 (BP Location: Right Arm)   Pulse 72   Temp 98.2 ?F (36.8 ?C)   Resp 18   Ht 5\' 10"  (1.778 m)   Wt 111 kg   SpO2 95%   BMI 35.11 kg/m?  ? ?Physical Exam: ?Gen:NAD, sitting in bed, NAD ?CVS: RRR ?Resp: clear anteriorly ?Abd: soft, PD cath in RLQ, excellent ?Ext: trace LE edema ? ?Labs: ?BMET ?Recent Labs  ?Lab 07/13/21 ?2050 07/14/21 ?7014 07/15/21 ?1030  ?NA 131* 131* 135  ?K 6.4* 6.2* 3.9  ?CL 95* 95* 99  ?CO2 20* 19* 27  ?GLUCOSE 109* 94 107*  ?BUN 126* 134* 67*  ?CREATININE 21.98* 21.94* 13.83*  ?CALCIUM 8.4* 8.5* 8.1*  ? ?CBC ?Recent Labs  ?Lab 07/13/21 ?2050 07/14/21 ?1314 07/15/21 ?3888  ?WBC 10.7* 13.0* 7.2  ?NEUTROABS 9.3* 11.1* 5.4  ?HGB 10.7* 11.1* 9.7*  ?HCT 33.0* 33.4* 28.8*  ?MCV 91.4 90.8 89.2  ?PLT 159 137* 133*  ? ? ?  ?Medications:   ? ? aspirin EC  81 mg Oral Daily  ? azithromycin  500 mg Oral Daily  ? benzonatate  100 mg Oral TID  ? calcium acetate  2,001 mg Oral TID with meals  ? carvedilol  12.5 mg Oral BID WC  ? Chlorhexidine Gluconate Cloth  6 each Topical Q0600  ? clopidogrel  75 mg Oral Daily  ? ezetimibe  10 mg Oral Daily  ? gentamicin cream  1 application. Topical Daily  ? heparin  5,000 Units Subcutaneous Q8H  ? insulin aspart  0-6 Units Subcutaneous TID WC  ? insulin  glargine-yfgn  10 Units Subcutaneous Daily  ? isosorbide dinitrate  30 mg Oral BID  ? rosuvastatin  40 mg Oral Daily  ? ? ? ? Madelon Lips MD ?07/16/2021, 11:48 AM   ?

## 2021-07-17 DIAGNOSIS — Z992 Dependence on renal dialysis: Secondary | ICD-10-CM | POA: Diagnosis not present

## 2021-07-17 DIAGNOSIS — N186 End stage renal disease: Secondary | ICD-10-CM | POA: Diagnosis not present

## 2021-07-18 ENCOUNTER — Telehealth: Payer: Self-pay

## 2021-07-18 DIAGNOSIS — Z992 Dependence on renal dialysis: Secondary | ICD-10-CM | POA: Diagnosis not present

## 2021-07-18 DIAGNOSIS — N186 End stage renal disease: Secondary | ICD-10-CM | POA: Diagnosis not present

## 2021-07-18 LAB — BODY FLUID CULTURE W GRAM STAIN: Culture: NO GROWTH

## 2021-07-18 NOTE — Telephone Encounter (Signed)
Transition Care Management Follow-up Telephone Call ?Date of discharge and from where: 07/16/2021, [facility]  ?How have you been since you were released from the hospital? He said he is doing all right, still a lot of coughing but getting better. He stated that he has his cough medicines.  ?Any questions or concerns? No ? ?Items Reviewed: ?Did the pt receive and understand the discharge instructions provided? Yes  ?Medications obtained and verified? Yes  - he said he has all of his medications and did not have any questions about the med regime.  ?Other? No  ?Any new allergies since your discharge? No  ?Dietary orders reviewed? Yes ?Do you have support at home? Yes , his wife ? ?Home Care and Equipment/Supplies: ?Were home health services ordered? no ?If so, what is the name of the agency? N/a  ?Has the agency set up a time to come to the patient's home? not applicable ?Were any new equipment or medical supplies ordered?  No ?What is the name of the medical supply agency? N/a ?Were you able to get the supplies/equipment? not applicable ?Do you have any questions related to the use of the equipment or supplies? No ? ?He already has a glucometer and scale. He does peritoneal dialysis nightly.  ? ?Functional Questionnaire: (I = Independent and D = Dependent) ?ADLs: independent ? ? ?Follow up appointments reviewed: ? ?PCP Hospital f/u appt confirmed? Yes  Scheduled to see Dr Wynetta Emery - 08/09/2021.  ?[facility] f/u appt confirmed? Yes  Scheduled to see Dr Zadie Rhine - 08/22/2021.   ?Are transportation arrangements needed? No  ?If their condition worsens, is the pt aware to call PCP or go to the Emergency Dept.? Yes ?Was the patient provided with contact information for the PCP's office or ED? Yes ?Was to pt encouraged to call back with questions or concerns? Yes ? ?

## 2021-07-19 DIAGNOSIS — N186 End stage renal disease: Secondary | ICD-10-CM | POA: Diagnosis not present

## 2021-07-19 DIAGNOSIS — Z992 Dependence on renal dialysis: Secondary | ICD-10-CM | POA: Diagnosis not present

## 2021-07-19 LAB — CULTURE, BLOOD (ROUTINE X 2)
Culture: NO GROWTH
Culture: NO GROWTH
Special Requests: ADEQUATE
Special Requests: ADEQUATE

## 2021-07-19 NOTE — Progress Notes (Signed)
Late Entry Note: ? ?D/C summary and last renal note faxed to [facility] for continuation of care. Unable to reach staff on 4/10 and asked to call back today.  ? ?Melven Sartorius ?Renal Navigator ?[phone] ?

## 2021-07-20 ENCOUNTER — Encounter: Payer: Self-pay | Admitting: Internal Medicine

## 2021-07-20 ENCOUNTER — Emergency Department: Payer: Medicare Other

## 2021-07-20 ENCOUNTER — Other Ambulatory Visit: Payer: Self-pay

## 2021-07-20 ENCOUNTER — Inpatient Hospital Stay
Admission: EM | Admit: 2021-07-20 | Discharge: 2021-07-22 | DRG: 865 | Disposition: A | Discharge status: Home / Self Care | Payer: Medicare Other | Attending: Internal Medicine | Admitting: Internal Medicine

## 2021-07-20 DIAGNOSIS — Z20822 Contact with and (suspected) exposure to covid-19: Secondary | ICD-10-CM | POA: Diagnosis present

## 2021-07-20 DIAGNOSIS — R778 Other specified abnormalities of plasma proteins: Secondary | ICD-10-CM | POA: Diagnosis not present

## 2021-07-20 DIAGNOSIS — J9601 Acute respiratory failure with hypoxia: Secondary | ICD-10-CM | POA: Diagnosis present

## 2021-07-20 DIAGNOSIS — I248 Other forms of acute ischemic heart disease: Secondary | ICD-10-CM | POA: Diagnosis present

## 2021-07-20 DIAGNOSIS — R059 Cough, unspecified: Secondary | ICD-10-CM | POA: Diagnosis not present

## 2021-07-20 DIAGNOSIS — Y95 Nosocomial condition: Secondary | ICD-10-CM | POA: Diagnosis present

## 2021-07-20 DIAGNOSIS — I5042 Chronic combined systolic (congestive) and diastolic (congestive) heart failure: Secondary | ICD-10-CM | POA: Diagnosis not present

## 2021-07-20 DIAGNOSIS — Z83438 Family history of other disorder of lipoprotein metabolism and other lipidemia: Secondary | ICD-10-CM

## 2021-07-20 DIAGNOSIS — Z7902 Long term (current) use of antithrombotics/antiplatelets: Secondary | ICD-10-CM

## 2021-07-20 DIAGNOSIS — D649 Anemia, unspecified: Secondary | ICD-10-CM

## 2021-07-20 DIAGNOSIS — Z833 Family history of diabetes mellitus: Secondary | ICD-10-CM

## 2021-07-20 DIAGNOSIS — Z888 Allergy status to other drugs, medicaments and biological substances status: Secondary | ICD-10-CM

## 2021-07-20 DIAGNOSIS — I25718 Atherosclerosis of autologous vein coronary artery bypass graft(s) with other forms of angina pectoris: Secondary | ICD-10-CM | POA: Diagnosis present

## 2021-07-20 DIAGNOSIS — E785 Hyperlipidemia, unspecified: Secondary | ICD-10-CM | POA: Diagnosis present

## 2021-07-20 DIAGNOSIS — D631 Anemia in chronic kidney disease: Secondary | ICD-10-CM | POA: Diagnosis not present

## 2021-07-20 DIAGNOSIS — E1142 Type 2 diabetes mellitus with diabetic polyneuropathy: Secondary | ICD-10-CM | POA: Diagnosis present

## 2021-07-20 DIAGNOSIS — I132 Hypertensive heart and chronic kidney disease with heart failure and with stage 5 chronic kidney disease, or end stage renal disease: Secondary | ICD-10-CM | POA: Diagnosis present

## 2021-07-20 DIAGNOSIS — I10 Essential (primary) hypertension: Secondary | ICD-10-CM | POA: Diagnosis present

## 2021-07-20 DIAGNOSIS — F1290 Cannabis use, unspecified, uncomplicated: Secondary | ICD-10-CM | POA: Diagnosis present

## 2021-07-20 DIAGNOSIS — Z7982 Long term (current) use of aspirin: Secondary | ICD-10-CM | POA: Diagnosis not present

## 2021-07-20 DIAGNOSIS — J189 Pneumonia, unspecified organism: Secondary | ICD-10-CM | POA: Diagnosis present

## 2021-07-20 DIAGNOSIS — N2581 Secondary hyperparathyroidism of renal origin: Secondary | ICD-10-CM | POA: Diagnosis present

## 2021-07-20 DIAGNOSIS — Z79899 Other long term (current) drug therapy: Secondary | ICD-10-CM | POA: Diagnosis not present

## 2021-07-20 DIAGNOSIS — Z992 Dependence on renal dialysis: Secondary | ICD-10-CM | POA: Diagnosis not present

## 2021-07-20 DIAGNOSIS — R0602 Shortness of breath: Secondary | ICD-10-CM

## 2021-07-20 DIAGNOSIS — Z8249 Family history of ischemic heart disease and other diseases of the circulatory system: Secondary | ICD-10-CM

## 2021-07-20 DIAGNOSIS — I25708 Atherosclerosis of coronary artery bypass graft(s), unspecified, with other forms of angina pectoris: Secondary | ICD-10-CM | POA: Diagnosis not present

## 2021-07-20 DIAGNOSIS — Z794 Long term (current) use of insulin: Secondary | ICD-10-CM

## 2021-07-20 DIAGNOSIS — B348 Other viral infections of unspecified site: Secondary | ICD-10-CM | POA: Diagnosis not present

## 2021-07-20 DIAGNOSIS — Z841 Family history of disorders of kidney and ureter: Secondary | ICD-10-CM

## 2021-07-20 DIAGNOSIS — R0600 Dyspnea, unspecified: Secondary | ICD-10-CM | POA: Diagnosis not present

## 2021-07-20 DIAGNOSIS — I509 Heart failure, unspecified: Secondary | ICD-10-CM | POA: Diagnosis not present

## 2021-07-20 DIAGNOSIS — R0902 Hypoxemia: Principal | ICD-10-CM

## 2021-07-20 DIAGNOSIS — E1122 Type 2 diabetes mellitus with diabetic chronic kidney disease: Secondary | ICD-10-CM | POA: Diagnosis not present

## 2021-07-20 DIAGNOSIS — N186 End stage renal disease: Secondary | ICD-10-CM | POA: Diagnosis present

## 2021-07-20 DIAGNOSIS — Z89412 Acquired absence of left great toe: Secondary | ICD-10-CM | POA: Diagnosis not present

## 2021-07-20 DIAGNOSIS — R9431 Abnormal electrocardiogram [ECG] [EKG]: Secondary | ICD-10-CM | POA: Diagnosis present

## 2021-07-20 DIAGNOSIS — J90 Pleural effusion, not elsewhere classified: Secondary | ICD-10-CM | POA: Diagnosis not present

## 2021-07-20 DIAGNOSIS — I2510 Atherosclerotic heart disease of native coronary artery without angina pectoris: Secondary | ICD-10-CM | POA: Diagnosis present

## 2021-07-20 DIAGNOSIS — E119 Type 2 diabetes mellitus without complications: Secondary | ICD-10-CM | POA: Diagnosis not present

## 2021-07-20 DIAGNOSIS — Z13220 Encounter for screening for lipoid disorders: Secondary | ICD-10-CM | POA: Diagnosis not present

## 2021-07-20 DIAGNOSIS — I25118 Atherosclerotic heart disease of native coronary artery with other forms of angina pectoris: Secondary | ICD-10-CM | POA: Diagnosis not present

## 2021-07-20 DIAGNOSIS — I110 Hypertensive heart disease with heart failure: Secondary | ICD-10-CM | POA: Diagnosis not present

## 2021-07-20 DIAGNOSIS — R7989 Other specified abnormal findings of blood chemistry: Secondary | ICD-10-CM | POA: Diagnosis present

## 2021-07-20 DIAGNOSIS — Z9861 Coronary angioplasty status: Secondary | ICD-10-CM

## 2021-07-20 LAB — CBC WITH DIFFERENTIAL/PLATELET
Abs Immature Granulocytes: 0.05 10*3/uL (ref 0.00–0.07)
Basophils Absolute: 0 10*3/uL (ref 0.0–0.1)
Basophils Relative: 1 %
Eosinophils Absolute: 0.4 10*3/uL (ref 0.0–0.5)
Eosinophils Relative: 8 %
HCT: 31.3 % — ABNORMAL LOW (ref 39.0–52.0)
Hemoglobin: 10 g/dL — ABNORMAL LOW (ref 13.0–17.0)
Immature Granulocytes: 1 %
Lymphocytes Relative: 16 %
Lymphs Abs: 0.8 10*3/uL (ref 0.7–4.0)
MCH: 28.9 pg (ref 26.0–34.0)
MCHC: 31.9 g/dL (ref 30.0–36.0)
MCV: 90.5 fL (ref 80.0–100.0)
Monocytes Absolute: 0.7 10*3/uL (ref 0.1–1.0)
Monocytes Relative: 15 %
Neutro Abs: 2.9 10*3/uL (ref 1.7–7.7)
Neutrophils Relative %: 59 %
Platelets: 195 10*3/uL (ref 150–400)
RBC: 3.46 MIL/uL — ABNORMAL LOW (ref 4.22–5.81)
RDW: 15.4 % (ref 11.5–15.5)
WBC: 4.8 10*3/uL (ref 4.0–10.5)
nRBC: 0 % (ref 0.0–0.2)

## 2021-07-20 LAB — COMPREHENSIVE METABOLIC PANEL
ALT: 24 U/L (ref 0–44)
AST: 29 U/L (ref 15–41)
Albumin: 2.1 g/dL — ABNORMAL LOW (ref 3.5–5.0)
Alkaline Phosphatase: 98 U/L (ref 38–126)
Anion gap: 11 (ref 5–15)
BUN: 64 mg/dL — ABNORMAL HIGH (ref 6–20)
CO2: 24 mmol/L (ref 22–32)
Calcium: 8.2 mg/dL — ABNORMAL LOW (ref 8.9–10.3)
Chloride: 93 mmol/L — ABNORMAL LOW (ref 98–111)
Creatinine, Ser: 14.01 mg/dL — ABNORMAL HIGH (ref 0.61–1.24)
GFR, Estimated: 4 mL/min — ABNORMAL LOW (ref >60)
Glucose, Bld: 96 mg/dL (ref 70–99)
Potassium: 4.8 mmol/L (ref 3.5–5.1)
Sodium: 128 mmol/L — ABNORMAL LOW (ref 135–145)
Total Bilirubin: 0.9 mg/dL (ref 0.3–1.2)
Total Protein: 5.6 g/dL — ABNORMAL LOW (ref 6.5–8.1)

## 2021-07-20 LAB — BLOOD GAS, ARTERIAL
Acid-Base Excess: 0 mmol/L (ref 0.0–2.0)
Bicarbonate: 24.8 mmol/L (ref 20.0–28.0)
O2 Content: 2 L/min
O2 Saturation: 97.2 %
Patient temperature: 37
pCO2 arterial: 40 mmHg (ref 32–48)
pH, Arterial: 7.4 (ref 7.35–7.45)
pO2, Arterial: 89 mmHg (ref 83–108)

## 2021-07-20 LAB — CBG MONITORING, ED: Glucose-Capillary: 91 mg/dL (ref 70–99)

## 2021-07-20 LAB — RESP PANEL BY RT-PCR (FLU A&B, COVID) ARPGX2
Influenza A by PCR: NEGATIVE
Influenza B by PCR: NEGATIVE
SARS Coronavirus 2 by RT PCR: NEGATIVE

## 2021-07-20 LAB — CBC
HCT: 31.2 % — ABNORMAL LOW (ref 39.0–52.0)
Hemoglobin: 10 g/dL — ABNORMAL LOW (ref 13.0–17.0)
MCH: 29 pg (ref 26.0–34.0)
MCHC: 32.1 g/dL (ref 30.0–36.0)
MCV: 90.4 fL (ref 80.0–100.0)
Platelets: 190 10*3/uL (ref 150–400)
RBC: 3.45 MIL/uL — ABNORMAL LOW (ref 4.22–5.81)
RDW: 15.3 % (ref 11.5–15.5)
WBC: 4.9 10*3/uL (ref 4.0–10.5)
nRBC: 0 % (ref 0.0–0.2)

## 2021-07-20 LAB — EXPECTORATED SPUTUM ASSESSMENT W GRAM STAIN, RFLX TO RESP C

## 2021-07-20 LAB — TROPONIN I (HIGH SENSITIVITY)
Troponin I (High Sensitivity): 131 ng/L (ref <18)
Troponin I (High Sensitivity): 123 ng/L (ref <18)

## 2021-07-20 LAB — LACTIC ACID, PLASMA
Lactic Acid, Venous: 0.7 mmol/L (ref 0.5–1.9)
Lactic Acid, Venous: 0.7 mmol/L (ref 0.5–1.9)

## 2021-07-20 LAB — BRAIN NATRIURETIC PEPTIDE: B Natriuretic Peptide: 1390.8 pg/mL — ABNORMAL HIGH (ref 0.0–100.0)

## 2021-07-20 LAB — PROCALCITONIN: Procalcitonin: 11.93 ng/mL

## 2021-07-20 MED ORDER — ISOSORBIDE DINITRATE 30 MG PO TABS
30.0000 mg | ORAL_TABLET | Freq: Two times a day (BID) | ORAL | Status: DC
Start: 2021-07-21 — End: 2021-07-22
  Administered 2021-07-21 – 2021-07-22 (×3): 30 mg via ORAL
  Filled 2021-07-20 (×3): qty 1

## 2021-07-20 MED ORDER — SODIUM CHLORIDE 0.9 % IV SOLN
1.0000 g | INTRAVENOUS | Status: DC
  Administered 2021-07-21: 1 g via INTRAVENOUS
  Filled 2021-07-20 (×2): qty 10

## 2021-07-20 MED ORDER — SODIUM CHLORIDE 0.9% FLUSH
3.0000 mL | Freq: Two times a day (BID) | INTRAVENOUS | Status: DC
  Administered 2021-07-21 – 2021-07-22 (×4): 3 mL via INTRAVENOUS

## 2021-07-20 MED ORDER — ASPIRIN EC 81 MG PO TBEC
81.0000 mg | DELAYED_RELEASE_TABLET | Freq: Every day | ORAL | Status: DC
  Administered 2021-07-20 – 2021-07-22 (×3): 81 mg via ORAL
  Filled 2021-07-20 (×3): qty 1

## 2021-07-20 MED ORDER — IPRATROPIUM-ALBUTEROL 0.5-2.5 (3) MG/3ML IN SOLN
3.0000 mL | Freq: Once | RESPIRATORY_TRACT | Status: AC
Start: 2021-07-20 — End: 2021-07-20
  Administered 2021-07-20: 3 mL via RESPIRATORY_TRACT
  Filled 2021-07-20: qty 3

## 2021-07-20 MED ORDER — CALCITRIOL 0.25 MCG PO CAPS
2.0000 ug | ORAL_CAPSULE | Freq: Every day | ORAL | Status: DC
  Administered 2021-07-21 – 2021-07-22 (×2): 2 ug via ORAL
  Filled 2021-07-20 (×2): qty 8

## 2021-07-20 MED ORDER — HEPARIN SODIUM (PORCINE) 5000 UNIT/ML IJ SOLN
5000.0000 [IU] | Freq: Three times a day (TID) | INTRAMUSCULAR | Status: DC
  Administered 2021-07-21 – 2021-07-22 (×5): 5000 [IU] via SUBCUTANEOUS
  Filled 2021-07-20 (×5): qty 1

## 2021-07-20 MED ORDER — ACETAMINOPHEN 500 MG PO TABS: 1000.0000 mg | ORAL_TABLET | Freq: Three times a day (TID) | ORAL | Status: DC | PRN

## 2021-07-20 MED ORDER — CEFEPIME HCL 1 G IJ SOLR
1.0000 g | Freq: Once | INTRAMUSCULAR | Status: AC
  Administered 2021-07-20: 1 g via INTRAVENOUS
  Filled 2021-07-20: qty 10

## 2021-07-20 MED ORDER — CARVEDILOL 12.5 MG PO TABS
12.5000 mg | ORAL_TABLET | Freq: Two times a day (BID) | ORAL | Status: DC
Start: 2021-07-21 — End: 2021-07-22
  Administered 2021-07-21 – 2021-07-22 (×3): 12.5 mg via ORAL
  Filled 2021-07-20 (×3): qty 1

## 2021-07-20 MED ORDER — SODIUM CHLORIDE 0.9 % IV SOLN: 250.0000 mL | INTRAVENOUS | Status: DC | PRN

## 2021-07-20 MED ORDER — CLOPIDOGREL BISULFATE 75 MG PO TABS
75.0000 mg | ORAL_TABLET | Freq: Every day | ORAL | Status: DC
  Administered 2021-07-20 – 2021-07-22 (×3): 75 mg via ORAL
  Filled 2021-07-20 (×3): qty 1

## 2021-07-20 MED ORDER — NITROGLYCERIN 0.4 MG SL SUBL: 0.4000 mg | SUBLINGUAL_TABLET | SUBLINGUAL | Status: DC | PRN

## 2021-07-20 MED ORDER — SODIUM CHLORIDE 0.9% FLUSH: 3.0000 mL | INTRAVENOUS | Status: DC | PRN

## 2021-07-20 MED ORDER — DELFLEX-LC/1.5% DEXTROSE 344 MOSM/L IP SOLN
INTRAPERITONEAL | Status: DC
  Filled 2021-07-20 (×4): qty 3000

## 2021-07-20 MED ORDER — GENTAMICIN SULFATE 0.1 % EX CREA
1.0000 "application " | TOPICAL_CREAM | Freq: Every day | CUTANEOUS | Status: DC
  Administered 2021-07-21: 1 via TOPICAL
  Filled 2021-07-20 (×2): qty 15

## 2021-07-20 MED ORDER — ATORVASTATIN CALCIUM 20 MG PO TABS
40.0000 mg | ORAL_TABLET | Freq: Every day | ORAL | Status: DC
  Administered 2021-07-20 – 2021-07-22 (×3): 40 mg via ORAL
  Filled 2021-07-20 (×3): qty 2

## 2021-07-20 NOTE — ED Provider Triage Note (Signed)
Emergency Medicine Provider Triage Evaluation Note ? ?Donald [name] , a 51 y.o. male  was evaluated in triage.  Pt complains of coughing and trouble breathing. Was admitted to [facility] with pneumonia.  Still taking antibiotics.  Discharged 4 days ago and still coughing and states he is more short of breath.  Having chills but not aware of fever. ? ?Review of Systems  ?Positive: Positive chills, nausea, excessive coughing ?Negative: No fever ? ?Physical Exam  ?There were no vitals taken for this visit. ?Gen:   Awake, no distress   ?Resp:  Normal effort, no wheezing.  O2 sat 100%.  Very congested cough heard. ?MSK:   Moves extremities without difficulty  ?Other:   ? ?Medical Decision Making  ?Medically screening exam initiated at 2:18 PM.  Appropriate orders placed.  Donald [name] was informed that the remainder of the evaluation will be completed by another provider, this initial triage assessment does not replace that evaluation, and the importance of remaining in the ED until their evaluation is complete. ? ? ?  ?Johnn Hai, PA-C ?07/20/21 1425 ? ?

## 2021-07-20 NOTE — Consult Note (Addendum)
Pharmacy Antibiotic Note ? ?Donald [name] is a 51 y.o. male admitted on 07/20/2021 with SOB. Patient with PHM of ESRD on PD.  Patient recently amitted 4/5-07/16/21 at [facility] and treated for community-acquired pneumonia with Rocephin and Zithromax.  He was discharged on Augmentin.   Pharmacy has been consulted for Cefepime dosing. ? ?Plan: ?Initiate cefepime 1 gram IV Q24H based on ESRD - PD dosing ? ?Height: 5\' 10"  (177.8 cm) ?Weight: 106.6 kg (235 lb) ?IBW/kg (Calculated) : 73 ? ?Temp (24hrs), Avg:98 ?F (36.7 ?C), Min:97.7 ?F (36.5 ?C), Max:98.2 ?F (36.8 ?C) ? ?Recent Labs  ?Lab 07/13/21 ?2050 07/13/21 ?2339 07/14/21 ?0506 07/15/21 ?0233 07/20/21 ?1426 07/20/21 ?1630  ?WBC 10.7*  --  13.0* 7.2 4.8  4.9  --   ?CREATININE 21.98*  --  21.94* 13.83* 14.01*  --   ?LATICACIDVEN  --  1.0  --   --  0.7 0.7  ?  ?Estimated Creatinine Clearance: 7.8 mL/min (A) (by C-G formula based on SCr of 14.01 mg/dL (H)).   ? ?Allergies  ?Allergen Reactions  ? Other Swelling  ?  Other reaction(s): EGGPLANT ?Facial swelling  ? Lisinopril Hives  ? ? ?Antimicrobials this admission: ?4/12 cefepime >>  ? ? ?Dose adjustments this admission: ? ? ?Microbiology results: ? ?4/12 Sputum: sent  ? ? ?Thank you for allowing pharmacy to be a part of this patient?s care. ? ?Dorothe Pea, PharmD, BCPS ?Clinical Pharmacist   ?07/20/2021 8:18 PM ? ?

## 2021-07-20 NOTE — Assessment & Plan Note (Addendum)
.    Evaluated by cardiology.  Elevated troponins were thought to be due to demand ischemia.  EKG does have marked T wave abnormalities as well as ST depression in the inferior and anterolateral leads but considering no anginal symptoms, cardiology recommends outpatient evaluation - outpatient cardiologist Dr. Edwin Dada who he should see within 1 to 2 weeks of discharge. ?

## 2021-07-20 NOTE — ED Triage Notes (Signed)
Pt states here for shob and worsening cough, pt recently admitted for pneumonia at [facility].  ?

## 2021-07-20 NOTE — ED Provider Notes (Signed)
? ?[facility] ?Provider Note ? ? ? Event Date/Time  ? First MD Initiated Contact with Patient 07/20/21 1621   ?  (approximate) ? ? ?History  ? ?Shortness of Breath ? ? ?HPI ? ?Donald [name] is a 51 y.o. male with a past history of end-stage renal disease on peritoneal dialysis coronary artery disease CHF diabetes hypertension.  He was seen at [facility] on the fourth fifth of this month and treated for community-acquired pneumonia with Rocephin and Zithromax.  He was discharged on Augmentin.  Patient says he was taking 1 pill a day.  He finished and has had progressive worsening since his discharge of coughing and shortness of breath.  His cough is productive of mostly thick whitish-yellowish phlegm.  He cannot lay down at night because he gets short of breath.  Here in the emergency department his O2 sats ranged from 88 occasionally to 95 immediately after coughing and dropped precipitously afterwards. ? ?  ? ? ?Physical Exam  ? ?Triage Vital Signs: ?ED Triage Vitals  ?Enc Vitals Group  ?   BP 07/20/21 1421 118/81  ?   Pulse Rate 07/20/21 1421 73  ?   Resp 07/20/21 1421 18  ?   Temp 07/20/21 1421 98.2 ?F (36.8 ?C)  ?   Temp Source 07/20/21 1421 Oral  ?   SpO2 07/20/21 1421 100 %  ?   Weight 07/20/21 1422 235 lb (106.6 kg)  ?   Height 07/20/21 1422 5\' 10"  (1.778 m)  ?   Head Circumference --   ?   Peak Flow --   ?   Pain Score 07/20/21 1422 7  ?   Pain Loc --   ?   Pain Edu? --   ?   Excl. in [place]? --   ? ? ?Most recent vital signs: ?Vitals:  ? 07/20/21 1421 07/20/21 1630  ?BP: 118/81 (!) 149/94  ?Pulse: 73 88  ?Resp: 18 (!) 30  ?Temp: 98.2 ?F (36.8 ?C)   ?SpO2: 100% 93%  ? ? ? ?General: Awake, alert but looks sleepy and uncomfortable ?CV:  Good peripheral perfusion.  Heart regular rate and rhythm no audible murmurs ?Resp:  Slightly increased respiratory effort I cannot hear any crackles in his lungs ?Abd:  No distention.  Soft and nontender ?Extremities no edema ? ? ?ED Results / Procedures /  Treatments  ? ?Labs ?(all labs ordered are listed, but only abnormal results are displayed) ?Labs Reviewed  ?CBC - Abnormal; Notable for the following components:  ?    Result Value  ? RBC 3.45 (*)   ? Hemoglobin 10.0 (*)   ? HCT 31.2 (*)   ? All other components within normal limits  ?COMPREHENSIVE METABOLIC PANEL - Abnormal; Notable for the following components:  ? Sodium 128 (*)   ? Chloride 93 (*)   ? BUN 64 (*)   ? Creatinine, Ser 14.01 (*)   ? Calcium 8.2 (*)   ? Total Protein 5.6 (*)   ? Albumin 2.1 (*)   ? GFR, Estimated 4 (*)   ? All other components within normal limits  ?BRAIN NATRIURETIC PEPTIDE - Abnormal; Notable for the following components:  ? B Natriuretic Peptide 1,390.8 (*)   ? All other components within normal limits  ?CBC WITH DIFFERENTIAL/PLATELET - Abnormal; Notable for the following components:  ? RBC 3.46 (*)   ? Hemoglobin 10.0 (*)   ? HCT 31.3 (*)   ? All other components within normal  limits  ?TROPONIN I (HIGH SENSITIVITY) - Abnormal; Notable for the following components:  ? Troponin I (High Sensitivity) 131 (*)   ? All other components within normal limits  ?TROPONIN I (HIGH SENSITIVITY) - Abnormal; Notable for the following components:  ? Troponin I (High Sensitivity) 123 (*)   ? All other components within normal limits  ?RESP PANEL BY RT-PCR (FLU A&B, COVID) ARPGX2  ?EXPECTORATED SPUTUM ASSESSMENT W GRAM STAIN, RFLX TO RESP C  ?CULTURE, RESPIRATORY W GRAM STAIN  ?LACTIC ACID, PLASMA  ?LACTIC ACID, PLASMA  ?PROCALCITONIN  ? ? ? ?EKG ? ?EKG read interpreted by me shows normal sinus rhythm at 72 normal axis patient has flipped T's inferiorly and in almost all of the leads.  These were present on previous EKGs but at least in the chest leads are much more pronounced than previously ? ? ?RADIOLOGY ?Chest x-ray read by radiology films reviewed by me do show patchy infiltrates bilaterally ? ? ?PROCEDURES: ? ?Critical Care performed:  ? ?Procedures ? ? ?MEDICATIONS ORDERED IN  ED: ?Medications  ?ipratropium-albuterol (DUONEB) 0.5-2.5 (3) MG/3ML nebulizer solution 3 mL (3 mLs Nebulization Given 07/20/21 1634)  ? ? ? ?IMPRESSION / MDM / ASSESSMENT AND PLAN / ED COURSE  ?I reviewed the triage vital signs and the nursing notes. ?Patient's BNP is 8300.  I paged the renal doc.  I did get a hold of the nurse downstairs will page him again.  Patient is procalcitonin is 11.  This is very unusual.  Patient does not have any belly pain.  He does not have a fever or white count.  I initially gave him some Rocephin and Zithromax to continue treatment for his apparent initially community-acquired pneumonia.  I will add Maxipime now since he was in [facility]. ?I am paging the hospitalist. ? ?The patient is on the cardiac monitor to evaluate for evidence of arrhythmia and/or significant heart rate changes.  None have been seen ? ? ? ?FINAL CLINICAL IMPRESSION(S) / ED DIAGNOSES  ? ?Final diagnoses:  ?Hypoxia  ?Dyspnea, unspecified type  ?SOB (shortness of breath)  ?Healthcare-associated pneumonia  ?Congestive heart failure, unspecified HF chronicity, unspecified heart failure type ([place])  ? ? ? ?Rx / DC Orders  ? ?ED Discharge Orders   ? ? None  ? ?  ? ? ? ?Note:  This document was prepared using Dragon voice recognition software and may include unintentional dictation errors. ?  ?Nena Polio, MD ?07/20/21 1819 ? ?

## 2021-07-20 NOTE — Assessment & Plan Note (Addendum)
Well controlled 

## 2021-07-20 NOTE — H&P (Signed)
?History and Physical  ? ? ?Patient: Donald [name] YWV:[id number] DOB: July 10, 1970 ?DOA: 07/20/2021 ?DOS: the patient was seen and examined on 07/20/2021 ?PCP: Ladell Pier, MD  ?Patient coming from: Home ? ?Chief Complaint:  ?Chief Complaint  ?Patient presents with  ? Shortness of Breath  ? ?HPI: Donald [name] is a 51 y.o. male with medical history significant of ESRD On PD, CAD/ CABG, HTN, DM II, coming for cough fever chills and sob, pt is in mild to moderate distress with coughing and being sob with speaking. ?Pt was recently discharged from [facility] from 4/5-4/8 for community-acquired pneumonia with antibiotics. Pt does PD every night.  ?Patient had an echocardiogram that shows: ? ? ? ?  ?Chart review shows that patient had a cardiology visit with [facility] cardiology on April 18, 2021.  Patient is currently on dual antiplatelet therapy with aspirin Plavix Zetia and beta-blocker and isosorbide dinitrate. ?Chart review shows patient has severe CAD history last February 2002 shows: ?1.  Severe native three-vessel coronary artery disease with patency of the left mainstem, moderate diffuse proximal and mid LAD stenosis, severe ostial left circumflex stenosis, and total occlusion of the RCA. ?2.  Continued patency of the LIMA to LAD graft and saphenous vein graft to OM1 with no disease in either bypass graft ?3.  Patent SVG to right PDA with total occlusion of the PDA and PLA branches beyond the graft insertion site, some collateral filling of the PLA branches identified (suspect culprit lesion) ?4.  Moderate segmental LV systolic dysfunction based on echo assessment with akinesis of the inferior wall and LVEF 35 to 40% ?  ?Recommendation: Favor medical therapy.  Suspect interval occlusion just distal to the SVG to PDA graft.  The AV circumflex has severe diffuse disease that would require extensive stenting and supplies a relatively small amount of myocardium.  If refractory angina, PCI of this vessel could be  considered. Add clopidogrel 75 mg daily for medical therapy of NSTEMI-ACS.  ? ?Review of Systems: Review of Systems  ?Unable to perform ROS: Acuity of condition (per wife at bedside.)  ?Constitutional:  Positive for chills, fever and malaise/fatigue.  ?Respiratory:  Positive for cough, shortness of breath and wheezing.   ?Cardiovascular:  Positive for leg swelling.  ?Gastrointestinal:   ?     Abdominal swelling with peau de orange appearance of skin.   ?Neurological:  Positive for weakness.  ?All other systems reviewed and are negative. ? ?Past Medical History:  ?Diagnosis Date  ? CAD (coronary artery disease)   ? S/p CABG in 2016 in [place] //cath 2/22: Patent grafts; PDA/PLA branch is occluded beyond graft insertion; severe diffuse disease in native AV circumflex supplies small amount of myocardium-if refractory angina, PCI could be considered>> adequate therapy for now  ? Diabetes mellitus without complication ([place])   ? ESRD on peritoneal dialysis [facility])   ? dialysis   ? HFrEF (heart failure with reduced ejection fraction) ([place])   ? EF 35-40  ? History of vitrectomy 2014  ? anterior, left  ? Hx of heart bypass surgery   ? Hyperlipidemia   ? Hypertension   ? ?Past Surgical History:  ?Procedure Laterality Date  ? AV FISTULA PLACEMENT    ? CARDIAC SURGERY    ? COLONOSCOPY WITH PROPOFOL N/A 02/11/2020  ? Procedure: COLONOSCOPY WITH PROPOFOL;  Surgeon: Toledo, Benay Pike, MD;  Location: [facility] ENDOSCOPY;  Service: Gastroenterology;  Laterality: N/A;  ? CORONARY ARTERY BYPASS GRAFT  2016  ? ESOPHAGOGASTRODUODENOSCOPY (EGD)  WITH PROPOFOL N/A 01/02/2020  ? Procedure: ESOPHAGOGASTRODUODENOSCOPY (EGD) WITH PROPOFOL;  Surgeon: Lesly Rubenstein, MD;  Location: [facility] ENDOSCOPY;  Service: Endoscopy;  Laterality: N/A;  ? INSERTION OF DIALYSIS CATHETER    ? left great toe amputation    ? LEFT HEART CATH AND CORS/GRAFTS ANGIOGRAPHY N/A 06/03/2020  ? Procedure: LEFT HEART CATH AND CORS/GRAFTS ANGIOGRAPHY;  Surgeon: Sherren Mocha, MD;  Location: [facility];  Service: Cardiovascular;  Laterality: N/A;  ? VITRECTOMY Left 2014  ? ?Social History:  reports that he has never smoked. He has never used smokeless tobacco. He reports current alcohol use. He reports current drug use. Drug: Marijuana. ? ?Allergies  ?Allergen Reactions  ? Other Swelling  ?  Other reaction(s): EGGPLANT ?Facial swelling  ? Lisinopril Hives  ? ? ?Family History  ?Problem Relation Age of Onset  ? Diabetes Mellitus II Mother   ? Heart disease Mother   ? Diabetes Mother   ? Hypertension Mother   ? Kidney failure Mother   ? Diabetes Maternal Grandmother   ? Hyperlipidemia Maternal Grandmother   ? Dementia Maternal Grandmother   ? ? ?Prior to Admission medications   ?Medication Sig Start Date End Date Taking? Authorizing Provider  ?acetaminophen (TYLENOL) 650 MG CR tablet Take 1,300 mg by mouth 2 (two) times daily as needed for pain.    [provider]  ?aspirin EC 81 MG EC tablet Take 1 tablet (81 mg total) by mouth daily. 07/17/19   Nita Sells, MD  ?benzonatate (TESSALON) 100 MG capsule Take 1 capsule (100 mg total) by mouth 3 (three) times daily for 7 days. 07/16/21 07/23/21  Terrilee Croak, MD  ?calcitRIOL (ROCALTROL) 0.5 MCG capsule Take 2 mcg by mouth daily. Take 4 tablets (2 mcg) daily.    [provider]  ?calcium acetate (PHOSLO) 667 MG capsule Take 2,001 mg by mouth See admin instructions. Take 2,001 mg with each meal. 06/30/20   [provider]  ?carvedilol (COREG) 12.5 MG tablet Take 1 tablet (12.5 mg total) by mouth 2 (two) times daily with a meal. 09/09/20   Donato Heinz, MD  ?cefdinir (OMNICEF) 300 MG capsule Take 1 capsule (300 mg total) by mouth 2 (two) times daily for 5 days. 07/16/21 07/21/21  Terrilee Croak, MD  ?clopidogrel (PLAVIX) 75 MG tablet Take 1 tablet (75 mg total) by mouth daily. 02/25/21   Ledora Bottcher, PA  ?Continuous Blood Gluc Sensor (FREESTYLE LIBRE SENSOR SYSTEM) MISC Use to check  blood sugar at least 4 times daily. Change sensor Q 2 wks 06/04/20   Ladell Pier, MD  ?ezetimibe (ZETIA) 10 MG tablet Take 1 tablet (10 mg total) by mouth daily. 02/25/21 07/14/21  Ledora Bottcher, PA  ?gentamicin cream (GARAMYCIN) 0.1 % Apply 1 application topically every morning. Apply to exit site every day    [provider]  ?guaiFENesin (ROBITUSSIN) 100 MG/5ML liquid Take 5 mLs by mouth every 4 (four) hours as needed for up to 7 days for cough or to loosen phlegm. 07/16/21 07/23/21  Terrilee Croak, MD  ?insulin lispro (HUMALOG KWIKPEN) 100 UNIT/ML KwikPen Inject 6 Units into the skin 3 (three) times daily. 03/28/21   Ladell Pier, MD  ?Insulin Pen Needle (TRUEPLUS PEN NEEDLES) 31G X 6 MM MISC Use to inject Novolog TID and Lantus once daily. Total injections in 1 day = 4. 09/13/20   Ladell Pier, MD  ?isosorbide dinitrate (ISORDIL) 30 MG tablet Take 1 tablet (30 mg total)  by mouth 2 (two) times daily. ?Patient taking differently: Take 30 mg by mouth daily. 01/29/21 01/29/22  Richardson Dopp T, PA-C  ?LANTUS SOLOSTAR 100 UNIT/ML Solostar Pen ADMINISTER 20 UNITS UNDER THE SKIN DAILY ?Patient taking differently: Inject 20 Units into the skin at bedtime. 06/21/21   Ladell Pier, MD  ?nitroGLYCERIN (NITROSTAT) 0.4 MG SL tablet Place 1 tablet (0.4 mg total) under the tongue every 5 (five) minutes as needed for chest pain. 01/29/21   Richardson Dopp T, PA-C  ?Peritoneal Dialysis Solutions (DIALYSIS SOLUTION 2.5% LOW-MG/LOW-CA) 394 MOSM/L SOLN dianeal solution As per nephro 07/16/19   Nita Sells, MD  ?polyethylene glycol powder (GLYCOLAX/MIRALAX) 17 GM/SCOOP powder Take 1 Container by mouth daily as needed for mild constipation.    [provider]  ?rosuvastatin (CRESTOR) 40 MG tablet Take 40 mg by mouth in the morning. 12/19/19   [provider]  ?senna (SENOKOT) 8.6 MG tablet Take 2 tablets (17.2 mg total) by mouth daily. ?Patient taking differently: Take 2 tablets by  mouth daily as needed for constipation. 07/16/19   Nita Sells, MD  ?Vitamin D, Ergocalciferol, (DRISDOL) 1.25 MG (50000 UNIT) CAPS capsule Take 50,000 Units by mouth every Thursday. ?Patient not taking: Re

## 2021-07-20 NOTE — Assessment & Plan Note (Addendum)
.    Getting PD per nephrology ?

## 2021-07-21 ENCOUNTER — Inpatient Hospital Stay: Payer: Medicare Other

## 2021-07-21 ENCOUNTER — Other Ambulatory Visit: Payer: Self-pay

## 2021-07-21 DIAGNOSIS — N186 End stage renal disease: Secondary | ICD-10-CM

## 2021-07-21 DIAGNOSIS — I10 Essential (primary) hypertension: Secondary | ICD-10-CM | POA: Diagnosis not present

## 2021-07-21 DIAGNOSIS — R778 Other specified abnormalities of plasma proteins: Secondary | ICD-10-CM

## 2021-07-21 DIAGNOSIS — B348 Other viral infections of unspecified site: Secondary | ICD-10-CM

## 2021-07-21 DIAGNOSIS — J9601 Acute respiratory failure with hypoxia: Secondary | ICD-10-CM | POA: Diagnosis not present

## 2021-07-21 DIAGNOSIS — Z992 Dependence on renal dialysis: Secondary | ICD-10-CM

## 2021-07-21 LAB — RESPIRATORY PANEL BY PCR

## 2021-07-21 LAB — CBC
HCT: 32.8 % — ABNORMAL LOW (ref 39.0–52.0)
Hemoglobin: 10.4 g/dL — ABNORMAL LOW (ref 13.0–17.0)
MCH: 28.8 pg (ref 26.0–34.0)
MCHC: 31.7 g/dL (ref 30.0–36.0)
MCV: 90.9 fL (ref 80.0–100.0)
Platelets: 188 10*3/uL (ref 150–400)
RBC: 3.61 MIL/uL — ABNORMAL LOW (ref 4.22–5.81)
RDW: 15.1 % (ref 11.5–15.5)
WBC: 4.3 10*3/uL (ref 4.0–10.5)
nRBC: 0 % (ref 0.0–0.2)

## 2021-07-21 LAB — PROCALCITONIN: Procalcitonin: 8.93 ng/mL

## 2021-07-21 LAB — LIPID PANEL
Cholesterol: 105 mg/dL (ref 0–200)
HDL: 31 mg/dL — ABNORMAL LOW (ref >40)
LDL Cholesterol: 50 mg/dL (ref 0–99)
Total CHOL/HDL Ratio: 3.4 RATIO
Triglycerides: 120 mg/dL (ref <150)
VLDL: 24 mg/dL (ref 0–40)

## 2021-07-21 LAB — TYPE AND SCREEN
ABO/RH(D): O POS
Antibody Screen: NEGATIVE

## 2021-07-21 LAB — D-DIMER, QUANTITATIVE: D-Dimer, Quant: 0.69 ug/mL-FEU — ABNORMAL HIGH (ref 0.00–0.50)

## 2021-07-21 LAB — HEMOGLOBIN A1C
Hgb A1c MFr Bld: 5.7 % — ABNORMAL HIGH (ref 4.8–5.6)
Mean Plasma Glucose: 116.89 mg/dL

## 2021-07-21 LAB — TSH: TSH: 1.043 u[IU]/mL (ref 0.350–4.500)

## 2021-07-21 LAB — MRSA NEXT GEN BY PCR, NASAL: MRSA by PCR Next Gen: NOT DETECTED

## 2021-07-21 LAB — TROPONIN I (HIGH SENSITIVITY): Troponin I (High Sensitivity): 115 ng/L (ref <18)

## 2021-07-21 LAB — GLUCOSE, CAPILLARY: Glucose-Capillary: 92 mg/dL (ref 70–99)

## 2021-07-21 LAB — T4, FREE: Free T4: 1.06 ng/dL (ref 0.61–1.12)

## 2021-07-21 MED ORDER — BENZONATATE 100 MG PO CAPS
100.0000 mg | ORAL_CAPSULE | Freq: Two times a day (BID) | ORAL | Status: DC
Start: 2021-07-21 — End: 2021-07-22
  Administered 2021-07-21 – 2021-07-22 (×2): 100 mg via ORAL
  Filled 2021-07-21 (×2): qty 1

## 2021-07-21 MED ORDER — PANTOPRAZOLE SODIUM 40 MG IV SOLR
40.0000 mg | Freq: Two times a day (BID) | INTRAVENOUS | Status: DC
Start: 2021-07-21 — End: 2021-07-21
  Administered 2021-07-21 (×2): 40 mg via INTRAVENOUS
  Filled 2021-07-21 (×2): qty 10

## 2021-07-21 MED ORDER — BENZONATATE 100 MG PO CAPS
100.0000 mg | ORAL_CAPSULE | Freq: Once | ORAL | Status: AC
  Administered 2021-07-21: 100 mg via ORAL
  Filled 2021-07-21: qty 1

## 2021-07-21 MED ORDER — CHLORHEXIDINE GLUCONATE CLOTH 2 % EX PADS
6.0000 | MEDICATED_PAD | Freq: Every day | CUTANEOUS | Status: DC
  Administered 2021-07-21 – 2021-07-22 (×2): 6 via TOPICAL

## 2021-07-21 MED ORDER — RENA-VITE PO TABS: 1.0000 | ORAL_TABLET | Freq: Every day | ORAL | Status: DC

## 2021-07-21 MED ORDER — CALCIUM ACETATE (PHOS BINDER) 667 MG PO CAPS
667.0000 mg | ORAL_CAPSULE | Freq: Three times a day (TID) | ORAL | Status: DC
  Administered 2021-07-21 – 2021-07-22 (×5): 667 mg via ORAL
  Filled 2021-07-21 (×6): qty 1

## 2021-07-21 MED ORDER — GUAIFENESIN-DM 100-10 MG/5ML PO SYRP
5.0000 mL | ORAL_SOLUTION | ORAL | Status: DC | PRN
  Administered 2021-07-21 – 2021-07-22 (×3): 5 mL via ORAL
  Filled 2021-07-21 (×4): qty 5

## 2021-07-21 MED ORDER — IPRATROPIUM-ALBUTEROL 0.5-2.5 (3) MG/3ML IN SOLN
3.0000 mL | Freq: Four times a day (QID) | RESPIRATORY_TRACT | Status: DC | PRN
  Administered 2021-07-21 (×2): 3 mL via RESPIRATORY_TRACT
  Filled 2021-07-21 (×2): qty 3

## 2021-07-21 MED ORDER — ORAL CARE MOUTH RINSE
15.0000 mL | Freq: Two times a day (BID) | OROMUCOSAL | Status: DC
  Administered 2021-07-21 – 2021-07-22 (×3): 15 mL via OROMUCOSAL

## 2021-07-21 MED ORDER — RENA-VITE PO TABS
1.0000 | ORAL_TABLET | Freq: Every day | ORAL | Status: DC
  Administered 2021-07-22: 1 via ORAL
  Filled 2021-07-21: qty 1

## 2021-07-21 MED ORDER — PROSOURCE PLUS PO LIQD
30.0000 mL | Freq: Three times a day (TID) | ORAL | Status: DC
  Administered 2021-07-21 – 2021-07-22 (×2): 30 mL via ORAL
  Filled 2021-07-21 (×5): qty 30

## 2021-07-21 MED ORDER — PANTOPRAZOLE SODIUM 40 MG PO TBEC
40.0000 mg | DELAYED_RELEASE_TABLET | Freq: Two times a day (BID) | ORAL | Status: DC
Start: 2021-07-21 — End: 2021-07-22
  Administered 2021-07-21 – 2021-07-22 (×2): 40 mg via ORAL
  Filled 2021-07-21 (×2): qty 1

## 2021-07-21 NOTE — Progress Notes (Addendum)
Pt is requesting something to eat and also requesting his calcium  acetate. Pt also complains of cough. NP Randol Kern  made aware. Will continue to monitor. ? ?Update 0310: NP Randol Kern placed order. Will continue to monitor. ? ?Update 0330: ICU staff cant' get a hold of on call dialysis nurse. Micah Noel Nurse notified MD Holley Raring. Per MD Late[place] PD will be at 0700 today. Will continue to monitor. Pt made aware. Will continue to monitor. ? ?Update 0409: Pt covid came back negative. NP Randol Kern made aware and discontinue order. Will continue to monitor. ? ?Update 0428: Pt didn't get relief from robitussin. NP Randol Kern made aware and placed order. Will continue to monitor. ? ?Update  0508 :Tessalon 100 mg capsule oral once was administered. Will continue to monitor. ?

## 2021-07-21 NOTE — Assessment & Plan Note (Signed)
.  continue coreg and imdur ?

## 2021-07-21 NOTE — Assessment & Plan Note (Signed)
Likely source of respiratory infection.  I will check procalcitonin and stop antibiotic at this time as respiratory panel is positive for parainfluenza virus.  Symptomatic management ?

## 2021-07-21 NOTE — Consult Note (Signed)
[facility] Cardiology ? ?CARDIOLOGY CONSULT NOTE  ?Patient ID: ?Donald [name] ?MRN: [id number] ?DOB/AGE: Jan 18, 1971 51 y.o. ? ?Admit date: 07/20/2021 ?Referring Physician Dr Posey Pronto ?Primary Physician Ladell Pier, MD ?Primary Cardiologist Cloretta Ned ?Reason for Consultation Recurrent volume overload ? 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  Cardiology is consulted for abnormal EKG and mildly elevated troponin. ? ?The patient is admitted with mild to moderate respiratory distress with coughing and shortness of breath.  Notably he was admitted to [facility] from 4 5-4 8 with community-acquired pneumonia and was treated with antibiotics.  He was discharged on 5 additional days of oral Augmentin and Mucinex.  Notably he had only been taking 1 pill a day since his discharge and had progressive cough productive mostly of thick whitish-yellow sputum.  He is short of breath when he lays flat.  On arrival to the emergency department his oxygen sats ranged from 88 to 95% which worsened immediately after coughing. ? ?At the time of my evaluation he is coughing frequently and states that the chest pain occurs with coughing and taking a deep breath.  Pain radiates from his front directly to his back and is very bothersome.  When this happens he desaturates to the mid to high 80s.  He says prior to his hospitalization he was taking his antibiotics as prescribed, and has been very compliant with his dialysis.  He does not feel that he is retaining fluid.  Notably he states that when he has had chest discomfort related to his heart in the past it is different and that it is left-sided and goes into his shoulder.  He has not been having this chest pain recently. ? ?Review of  systems complete and found to be negative unless listed above  ? ? ? ?Past Medical History:  ?Diagnosis Date  ? CAD (coronary artery disease)   ? S/p CABG in 2016 in [place] //cath 2/22: Patent grafts; PDA/PLA branch is occluded beyond graft insertion; severe diffuse disease in native AV circumflex supplies small amount of myocardium-if refractory angina, PCI could be considered>> adequate therapy for now  ? Diabetes mellitus without complication ([place])   ? ESRD on peritoneal dialysis [facility])   ? dialysis   ? HFrEF (heart failure with reduced ejection fraction) ([place])   ? EF 35-40  ? History of vitrectomy 2014  ? anterior, left  ? Hx of heart bypass surgery   ? Hyperlipidemia   ? Hypertension   ?  ?Past Surgical History:  ?Procedure Laterality Date  ? AV FISTULA PLACEMENT    ? CARDIAC SURGERY    ? COLONOSCOPY WITH PROPOFOL N/A 02/11/2020  ? Procedure: COLONOSCOPY WITH PROPOFOL;  Surgeon: Toledo, Benay Pike, MD;  Location: [facility] ENDOSCOPY;  Service: Gastroenterology;  Laterality: N/A;  ? CORONARY ARTERY BYPASS GRAFT  2016  ? ESOPHAGOGASTRODUODENOSCOPY (EGD) WITH PROPOFOL N/A 01/02/2020  ? Procedure: ESOPHAGOGASTRODUODENOSCOPY (EGD) WITH PROPOFOL;  Surgeon: Lesly Rubenstein, MD;  Location: [facility] ENDOSCOPY;  Service: Endoscopy;  Laterality: N/A;  ? INSERTION OF DIALYSIS CATHETER    ? left great toe amputation    ? LEFT HEART CATH AND CORS/GRAFTS ANGIOGRAPHY N/A 06/03/2020  ? Procedure: LEFT HEART CATH AND CORS/GRAFTS ANGIOGRAPHY;  Surgeon: Sherren Mocha, MD;  Location: [place] [facility]  LAB;  Service: Cardiovascular;  Laterality: N/A;  ? VITRECTOMY Left 2014  ?  ?Medications Prior to Admission  ?Medication Sig Dispense Refill Last Dose  ? aspirin EC 81 MG EC tablet Take 1 tablet (81 mg total) by mouth daily. 30 tablet 3 07/20/2021 at 0800  ? benzonatate (TESSALON) 100 MG capsule Take 1 capsule (100 mg total) by mouth 3 (three) times daily for 7 days. 20 capsule 0 07/20/2021 at 0800  ? calcitRIOL (ROCALTROL) 0.5 MCG capsule  Take 2 mcg by mouth daily. Take 4 tablets (2 mcg) daily.   07/20/2021 at 0800  ? calcium acetate (PHOSLO) 667 MG capsule Take 2,001 mg by mouth See admin instructions. Take 2,001 mg with each meal.   07/20/2021 at 0800  ? carvedilol (COREG) 12.5 MG tablet Take 1 tablet (12.5 mg total) by mouth 2 (two) times daily with a meal. 180 tablet 3 07/20/2021 at 0800  ? cefdinir (OMNICEF) 300 MG capsule Take 1 capsule (300 mg total) by mouth 2 (two) times daily for 5 days. 10 capsule 0 07/20/2021 at 0800  ? clopidogrel (PLAVIX) 75 MG tablet Take 1 tablet (75 mg total) by mouth daily. 90 tablet 3 07/20/2021 at 0800  ? ezetimibe (ZETIA) 10 MG tablet Take 1 tablet (10 mg total) by mouth daily. 90 tablet 3 07/20/2021 at 0800  ? gentamicin cream (GARAMYCIN) 0.1 % Apply 1 application topically every morning. Apply to exit site every day   07/20/2021  ? guaiFENesin (ROBITUSSIN) 100 MG/5ML liquid Take 5 mLs by mouth every 4 (four) hours as needed for up to 7 days for cough or to loosen phlegm. 120 mL 0 07/20/2021 at 0800  ? insulin lispro (HUMALOG KWIKPEN) 100 UNIT/ML KwikPen Inject 6 Units into the skin 3 (three) times daily. 15 mL 11 07/20/2021 at 0800  ? isosorbide dinitrate (ISORDIL) 30 MG tablet Take 1 tablet (30 mg total) by mouth 2 (two) times daily. (Patient taking differently: Take 30 mg by mouth daily.) 60 tablet 11 07/20/2021 at 0800  ? LANTUS SOLOSTAR 100 UNIT/ML Solostar Pen ADMINISTER 20 UNITS UNDER THE SKIN DAILY (Patient taking differently: Inject 20 Units into the skin at bedtime.) 15 mL 1 07/19/2021 at 2000  ? nitroGLYCERIN (NITROSTAT) 0.4 MG SL tablet Place 1 tablet (0.4 mg total) under the tongue every 5 (five) minutes as needed for chest pain. 25 tablet 12 07/20/2021 at 0900  ? rosuvastatin (CRESTOR) 40 MG tablet Take 40 mg by mouth in the morning.   07/20/2021 at 0800  ? acetaminophen (TYLENOL) 650 MG CR tablet Take 1,300 mg by mouth 2 (two) times daily as needed for pain. (Patient not taking: Reported on 07/20/2021)   Not  Taking  ? Continuous Blood Gluc Sensor (FREESTYLE LIBRE SENSOR SYSTEM) MISC Use to check blood sugar at least 4 times daily. Change sensor Q 2 wks 2 each 12   ? Insulin Pen Needle (TRUEPLUS PEN NEEDLES) 31G X 6 MM MISC Use to inject Novolog TID and Lantus once daily. Total injections in 1 day = 4. 100 each 6   ? Peritoneal Dialysis Solutions (DIALYSIS SOLUTION 2.5% LOW-MG/LOW-CA) 394 MOSM/L SOLN dianeal solution As per nephro  0   ? polyethylene glycol powder (GLYCOLAX/MIRALAX) 17 GM/SCOOP powder Take 1 Container by mouth daily as needed for mild constipation.   prn at prn  ? senna (SENOKOT) 8.6 MG tablet Take 2 tablets (17.2 mg total) by mouth daily. (Patient not taking: Reported on 07/20/2021) 30 tablet 3 Not Taking  ? Vitamin  D, Ergocalciferol, (DRISDOL) 1.25 MG (50000 UNIT) CAPS capsule Take 50,000 Units by mouth every Thursday. (Patient not taking: Reported on 07/14/2021)     ? ?Social History  ? ?Socioeconomic History  ? Marital status: Married  ?  Spouse name: Not on file  ? Number of children: 6  ? Years of education: Not on file  ? Highest education level: Some college, no degree  ?Occupational History  ? Occupation: disabled  ?Tobacco Use  ? Smoking status: Never  ? Smokeless tobacco: Never  ?Vaping Use  ? Vaping Use: Never used  ?Substance and Sexual Activity  ? Alcohol use: Yes  ?  Comment: occasionally  ? Drug use: Yes  ?  Types: Marijuana  ?  Comment: occasionally  ? Sexual activity: Not on file  ?Other Topics Concern  ? Not on file  ?Social History Narrative  ? Not on file  ? ?Social Determinants of Health  ? ?Financial Resource Strain: Low Risk   ? Difficulty of Paying Living Expenses: Not very hard  ?Food Insecurity: No Food Insecurity  ? Worried About Charity fundraiser in the Last Year: Never true  ? Ran Out of Food in the Last Year: Never true  ?Transportation Needs: No Transportation Needs  ? Lack of Transportation (Medical): No  ? Lack of Transportation (Non-Medical): No  ?Physical Activity:  Insufficiently Active  ? Days of Exercise per Week: 3 days  ? Minutes of Exercise per Session: 30 min  ?Stress: No Stress Concern Present  ? Feeling of Stress : Only a little  ?Social Connections: Unknown  ?

## 2021-07-21 NOTE — Plan of Care (Signed)

## 2021-07-21 NOTE — Progress Notes (Signed)
PD  treatment completed. PD catheter is clean dry and intact.Total UF removed 447ml. Pt is asymptomatic. ?

## 2021-07-21 NOTE — Plan of Care (Signed)

## 2021-07-21 NOTE — Assessment & Plan Note (Signed)
.  well compensated at this time ?

## 2021-07-21 NOTE — Progress Notes (Addendum)
?  Progress Note ? ? ?Patient: Donald [name] MVE:[id number] DOB: [birth year] DOA: 07/20/2021     1 ?DOS: the patient was seen and examined on 07/21/2021 ?  ?Brief hospital course: ? 51 y.o. male with PMH significant for ESRD on PD, CAD/CABG, chronic systolic CHF, DM 2, HTN, HLD admitted for persistent productive cough worrisome for viral upper respiratory tract infection ? ?4/13: Transfer to floor.  Respiratory panel positive for parainfluenza virus.  Getting CT chest without contrast to evaluate lung parenchyma ? ? ?Assessment and Plan: ?* Parainfluenza infection ?Likely source of respiratory infection.  I will check procalcitonin and stop antibiotic at this time as respiratory panel is positive for parainfluenza virus.  Symptomatic management ? ?Acute respiratory failure with hypoxia ([place]) ?.resolved. he is on RA now ? ?SOB (shortness of breath) ?. ? ?Elevated troponin ?.due to demand ischemia ? ?Type 2 diabetes mellitus with peripheral neuropathy (HCC) ?.  Well-controlled ? ?Essential hypertension ?.continue coreg and imdur ? ?ESRD on peritoneal dialysis [facility]) ?[name]  Getting PD per nephrology ? ?Anemia due to chronic kidney disease, on chronic dialysis ([place]) ?.stable H & H ? ?CAD (coronary artery disease) ?[name]  Evaluated by cardiology.  Elevated troponins were thought to be due to demand ischemia.  EKG does have marked T wave abnormalities as well as ST depression in the inferior and anterolateral leads but considering no anginal symptoms, cardiology recommends outpatient evaluation - outpatient cardiologist Dr. Edwin Dada who he should see within 1 to 2 weeks of discharge. ? ?Prolonged QT interval ?. ? ?Chronic combined systolic (congestive) and diastolic (congestive) heart failure ([place]) ?.well compensated at this time ? ? ? ? ?  ? ?Subjective: Coughing.  No chest pain or shortness of breath ? ?Physical Exam: ?Vitals:  ? 07/21/21 1300 07/21/21 1726 07/21/21 2004 07/21/21 2043  ?BP: 117/67 130/75  119/67  ?Pulse: 76 78  77   ?Resp: (!) 24 17  16   ?Temp:  98.7 ?F (37.1 ?C)  99.2 ?F (37.3 ?C)  ?TempSrc:    Oral  ?SpO2: 92% 100% 92% 91%  ?Weight:      ?Height:      ? ?51 year old African-American male lying in the bed comfortably but having intractable cough ? ?Lungs clear to auscultation bilaterally, no wheezing rales rhonchi crepitation ?Cardiovascular S1-S2 normal, no murmur rales or gallop ?Abdomen soft, benign ?Neuro alert and oriented, nonfocal ?Skin no rash or lesion ?Psych normal mood and affect ? ?Data Reviewed: ? ?Respiratory panel positive for parainfluenza virus ? ?Family Communication: None ? ?Disposition: ?Status is: Inpatient ?Remains inpatient appropriate because: Getting symptomatic management.  Having intractable cough ? ? Planned Discharge Destination: Home ? ? ? DVT prophylaxis-subcu heparin ?Time spent: 35 minutes ? ?Author: ?Max Sane, MD ?07/21/2021 9:24 PM ? ?For on call review [email or web address].  ?

## 2021-07-21 NOTE — Assessment & Plan Note (Signed)
.  due to demand ischemia ?

## 2021-07-21 NOTE — Progress Notes (Signed)
?[facility]  ?ROUNDING NOTE  ? ?Subjective:  ?Patient seen and evaluated at bedside. ?Well-known to our practice, Dr. Candiss Norse follows him for outpatient peritoneal dialysis management. ?Recently admitted to [facility] for pneumonia. ?Since discharge he continues to have cough and shortness of breath. ?He was found to have patchy airway infiltrates on chest x-ray. ?Troponin also quite elevated. ?Normally performs peritoneal dialysis nightly using 2.9 L fill volume with 5 exchanges. ?Patient to undergo PD this AM. ? ?Objective:  ?Vital signs in last 24 hours:  ?Temp:  [97.6 ?F (36.4 ?C)-98.5 ?F (36.9 ?C)] 98.5 ?F (36.9 ?C) (04/13 0750) ?Pulse Rate:  [73-88] 88 (04/13 0809) ?Resp:  [17-30] 20 (04/13 0600) ?BP: (108-154)/(62-99) 153/89 (04/13 0750) ?SpO2:  [92 %-100 %] 100 % (04/13 0750) ?Weight:  [106.6 kg-107.1 kg] 107.1 kg (04/13 0242) ? ?Weight change:  ?Filed Weights  ? 07/20/21 1422 07/21/21 0242  ?Weight: 106.6 kg 107.1 kg  ? ? ?Intake/Output: ?I/O last 3 completed shifts: ?In: 336 [P.O.:236; IV Piggyback:100] ?Out: -  ?  ?Intake/Output this shift: ? No intake/output data recorded. ? ?Physical Exam: ?General: No acute distress  ?Head: Normocephalic, atraumatic. Moist oral mucosal membranes  ?Eyes: Anicteric  ?Neck: Supple  ?Lungs:  Scattered rales, normal effort  ?Heart: S1S2 no rubs  ?Abdomen:  Soft, nontender, bowel sounds present  ?Extremities: No peripheral edema.  ?Neurologic: Awake, alert, following commands  ?Skin: No acute rash  ?Access: PD catheter in place  ? ? ?Basic Metabolic Panel: ?Recent Labs  ?Lab 07/15/21 ?0233 07/20/21 ?1426  ?NA 135 128*  ?K 3.9 4.8  ?CL 99 93*  ?CO2 27 24  ?GLUCOSE 107* 96  ?BUN 67* 64*  ?CREATININE 13.83* 14.01*  ?CALCIUM 8.1* 8.2*  ? ? ?Liver Function Tests: ?Recent Labs  ?Lab 07/20/21 ?1426  ?AST 29  ?ALT 24  ?ALKPHOS 98  ?BILITOT 0.9  ?PROT 5.6*  ?ALBUMIN 2.1*  ? ?No results for input(s): LIPASE, AMYLASE in the last 168 hours. ?No results for input(s):  AMMONIA in the last 168 hours. ? ?CBC: ?Recent Labs  ?Lab 07/15/21 ?0233 07/20/21 ?1426 07/21/21 ?0255  ?WBC 7.2 4.8  4.9 4.3  ?NEUTROABS 5.4 2.9  --   ?HGB 9.7* 10.0*  10.0* 10.4*  ?HCT 28.8* 31.3*  31.2* 32.8*  ?MCV 89.2 90.5  90.4 90.9  ?PLT 133* 195  190 188  ? ? ?Cardiac Enzymes: ?No results for input(s): CKTOTAL, CKMB, CKMBINDEX, TROPONINI in the last 168 hours. ? ?BNP: ?Invalid input(s): POCBNP ? ?CBG: ?Recent Labs  ?Lab 07/16/21 ?7517 07/16/21 ?0827 07/16/21 ?1145 07/20/21 ?2000 07/21/21 ?0238  ?GLUCAP 65* 89 83 91 92  ? ? ?Microbiology: ?Results for orders placed or performed during the hospital encounter of 07/20/21  ?Resp Panel by RT-PCR (Flu A&B, Covid) Nasopharyngeal Swab     Status: None  ? Collection Time: 07/20/21  4:30 PM  ? Specimen: Nasopharyngeal Swab; Nasopharyngeal(NP) swabs in vial transport medium  ?Result Value Ref Range Status  ? SARS Coronavirus 2 by RT PCR NEGATIVE NEGATIVE Final  ?  Comment: (NOTE) ?SARS-CoV-2 target nucleic acids are NOT DETECTED. ? ?The SARS-CoV-2 RNA is generally detectable in upper respiratory ?specimens during the acute phase of infection. The lowest ?concentration of SARS-CoV-2 viral copies this assay can detect is ?138 copies/mL. A negative result does not preclude SARS-Cov-2 ?infection and should not be used as the sole basis for treatment or ?other patient management decisions. A negative result may occur with  ?improper specimen collection/handling, submission of specimen other ?  than nasopharyngeal swab, presence of viral mutation(s) within the ?areas targeted by this assay, and inadequate number of viral ?copies(<138 copies/mL). A negative result must be combined with ?clinical observations, patient history, and epidemiological ?information. The expected result is Negative. ? ?Fact Sheet for Patients:  ?EntrepreneurPulse.com.au ? ?Fact Sheet for Healthcare Providers:  ?IncredibleEmployment.be ? ?This test is no t yet  approved or cleared by the [place] FDA and  ?has been authorized for detection and/or diagnosis of SARS-CoV-2 by ?FDA under an Emergency Use Authorization (EUA). This EUA will remain  ?in effect (meaning this test can be used) for the duration of the ?COVID-19 declaration under Section 564(b)(1) of the Act, 21 ?U.S.C.section 360bbb-3(b)(1), unless the authorization is terminated  ?or revoked sooner.  ? ? ?  ? Influenza A by PCR NEGATIVE NEGATIVE Final  ? Influenza B by PCR NEGATIVE NEGATIVE Final  ?  Comment: (NOTE) ?The Xpert Xpress SARS-CoV-2/FLU/RSV plus assay is intended as an aid ?in the diagnosis of influenza from Nasopharyngeal swab specimens and ?should not be used as a sole basis for treatment. Nasal washings and ?aspirates are unacceptable for Xpert Xpress SARS-CoV-2/FLU/RSV ?testing. ? ?Fact Sheet for Patients: ?EntrepreneurPulse.com.au ? ?Fact Sheet for Healthcare Providers: ?IncredibleEmployment.be ? ?This test is not yet approved or cleared by the [place] FDA and ?has been authorized for detection and/or diagnosis of SARS-CoV-2 by ?FDA under an Emergency Use Authorization (EUA). This EUA will remain ?in effect (meaning this test can be used) for the duration of the ?COVID-19 declaration under Section 564(b)(1) of the Act, 21 U.S.C. ?section 360bbb-3(b)(1), unless the authorization is terminated or ?revoked. ? ?Performed at [facility], [place], ?[place] [address] ?  ?Expectorated Sputum Assessment w Gram Stain, Rflx to Resp Cult     Status: None  ? Collection Time: 07/20/21  4:55 PM  ? Specimen: Sputum  ?Result Value Ref Range Status  ? Specimen Description SPUTUM  Final  ? Special Requests NONE  Final  ? Sputum evaluation   Final  ?  THIS SPECIMEN IS ACCEPTABLE FOR SPUTUM CULTURE ?Performed at [facility], [address] [place], [place] [address] ?  ? Report Status 07/20/2021 FINAL  Final  ?MRSA Next Gen by PCR,  Nasal     Status: None  ? Collection Time: 07/21/21  2:38 AM  ? Specimen: Nasal Mucosa; Nasal Swab  ?Result Value Ref Range Status  ? MRSA by PCR Next Gen NOT DETECTED NOT DETECTED Final  ?  Comment: (NOTE) ?The GeneXpert MRSA Assay (FDA approved for NASAL specimens only), ?is one component of a comprehensive MRSA colonization surveillance ?program. It is not intended to diagnose MRSA infection nor to guide ?or monitor treatment for MRSA infections. ?Test performance is not FDA approved in patients less than 2 years ?old. ?Performed at [facility], [place], ?[place] [address] ?  ? ? ?Coagulation Studies: ?No results for input(s): LABPROT, INR in the last 72 hours. ? ?Urinalysis: ?No results for input(s): COLORURINE, LABSPEC, [place], GLUCOSEU, HGBUR, BILIRUBINUR, KETONESUR, PROTEINUR, UROBILINOGEN, NITRITE, LEUKOCYTESUR in the last 72 hours. ? ?Invalid input(s): APPERANCEUR  ? ? ?Imaging: ?DG Chest 2 View ? ?Result Date: 07/20/2021 ?CLINICAL DATA:  Cough. Shortness of breath. Recent admission for pneumonia on 07/13/2021. EXAM: CHEST - 2 VIEW COMPARISON:  Chest two views 07/13/2021, 03/07/2021 FINDINGS: Status post median sternotomy. Cardiac silhouette is again mildly enlarged. Mediastinal contours are within normal limits with mild calcification within the aortic arch. Minimally improved aeration  of the prior opacification of the right infrahilar region with persistent mild heterogeneous airspace opacification. Mildly worsened small patchy airspace opacity within the left lower lung. No pleural effusion or pneumothorax. Moderate multilevel degenerative disc changes of the thoracic spine. A vascular stent again overlies the left subclavian/axillary artery. IMPRESSION: Compared to 07/13/2021: Mild bilateral patchy airspace opacities, likely mildly evolving infectious/inflammatory process/persistent mild pneumonia. Recommend follow-up radiographs 6 weeks after treatment to ensure resolution.  Electronically Signed   By: Yvonne Kendall M.D.   On: 07/20/2021 14:46   ? ? ?Medications:  ? ? sodium chloride    ? ceFEPime (MAXIPIME) IV    ? dialysis solution 1.5% low-MG/low-CA    ? ? aspirin EC  81 mg Oral

## 2021-07-21 NOTE — Progress Notes (Signed)
Initial Nutrition Assessment ? ?DOCUMENTATION CODES:  ? ?Obesity unspecified ? ?INTERVENTION:  ? ?Pro-Source Plus 25m po TID- Each supplement provides 100kcal and 15g protein  ? ?Rena-vite po daily  ? ?Liberalize diet  ? ?NUTRITION DIAGNOSIS:  ? ?Increased nutrient needs related to chronic illness (ESRD on PD) as evidenced by estimated needs. ? ?GOAL:  ? ?Patient will meet greater than or equal to 90% of their needs ? ?MONITOR:  ? ?PO intake, Supplement acceptance, Labs, Weight trends, Skin, I & O's ? ?REASON FOR ASSESSMENT:  ? ?Malnutrition Screening Tool ?  ? ?ASSESSMENT:  ? ?51y.o. male with past medical history of coronary disease status post CABG, diabetes mellitus type 2, ESRD on PD, chronic systolic heart failure ejection fraction 35 to 40%, hypertension, hyperlipidemia, anemia of chronic kidney disease, secondary hyperparathyroidism and recent pneumonia who was admitted with ongoing signs and symptoms of pneumonia. ? ?Met with pt in room today. Pt reports good appetite and oral intake pta and in hospital; pt documented to be eating 25-100% of meals in hospital. Pt reports that nutritional supplements upset his stomach but reports that he uses the protein bars at home. RD discussed with pt the importance of adequate nutrition needed to preserve lean muscle. Pt prefers to have ProSource Plus over the supplements. RD will add supplements and rena-vite to help pt meet his estimated needs and to replace losses from PD. Per chart, pt appears weight stable at baseline.  ? ?Medications reviewed and include: aspirin, calcitriol, phoslo, plavix, heparin, ceftriaxone ? ?Labs reviewed: Na 128(L), K 4.8 wnl, BUN 64(H), creat 14.01(H) ?Hgb 10.4(L), Hct 32.8(L) ?Cbgs- 92, 91 x 24 hrs ?AIC 5.7(H)- 4/13 ? ?NUTRITION - FOCUSED PHYSICAL EXAM: ? ?Flowsheet Row Most Recent Value  ?Orbital Region No depletion  ?Upper Arm Region No depletion  ?Thoracic and Lumbar Region No depletion  ?Buccal Region No depletion  ?Temple Region  No depletion  ?Clavicle Bone Region No depletion  ?Clavicle and Acromion Bone Region No depletion  ?Scapular Bone Region No depletion  ?Dorsal Hand No depletion  ?Patellar Region No depletion  ?Anterior Thigh Region No depletion  ?Posterior Calf Region No depletion  ?Edema (RD Assessment) None  ?Hair Reviewed  ?Eyes Reviewed  ?Mouth Reviewed  ?Skin Reviewed  ?Nails Reviewed  ? ?Diet Order:   ?Diet Order   ? ?       ?  Diet 2 gram sodium Room service appropriate? Yes; Fluid consistency: Thin  Diet effective now       ?  ? ?  ?  ? ?  ? ?EDUCATION NEEDS:  ? ?Education needs have been addressed ? ?Skin:  Skin Assessment: Reviewed RN Assessment ? ?Last BM:  4/13- type 6 ? ?Height:  ? ?Ht Readings from Last 1 Encounters:  ?07/21/21 _0  (1.778 m)  ? ? ?Weight:  ? ?Wt Readings from Last 1 Encounters:  ?07/21/21 107.1 kg  ? ? ?Ideal Body Weight:  75.45 kg ? ?BMI:  Body mass index is 33.88 kg/m?. ? ?Estimated Nutritional Needs:  ? ?Kcal:  2200-2500kcal/day ? ?Protein:  110-125g/day ? ?Fluid:  2.3-2.6L/day ? ?CKoleen DistanceMS, RD, LDN ?Please refer to [facility]for RD and/or RD on-call/weekend/after hours pager ? ? ?

## 2021-07-21 NOTE — ED Notes (Signed)
Attempted to notify on-call dialysis RN re: pt needing peritoneal dialysis. Dr. Holley Raring called as well. ?

## 2021-07-21 NOTE — Progress Notes (Signed)
PD site clean, dry and intact. PD initiated. Cycler went into first fill without any isues.  ?

## 2021-07-21 NOTE — Care Management Important Message (Signed)
Important Message ? ?Patient Details  ?Name: Donald [name] ?MRN: [id number] ?Date of Birth: [birth year] ? ? ?Medicare Important Message Given:  N/A - LOS <3 / Initial given by admissions ? ? ? ? ?Dannette Barbara ?07/21/2021, 6:17 PM ?

## 2021-07-21 NOTE — Progress Notes (Signed)
PHARMACIST - PHYSICIAN COMMUNICATION ? ?CONCERNING: IV to Oral Route Change Policy ? ?RECOMMENDATION: ?This patient is receiving pantoprazole by the intravenous route.  Based on criteria approved by the Pharmacy and Therapeutics Committee, the intravenous medication(s) is/are being converted to the equivalent oral dose form(s). ? ? ?DESCRIPTION: ?These criteria include: ?The patient is eating (either orally or via tube) and/or has been taking other orally administered medications for a least 24 hours ?The patient has no evidence of active gastrointestinal bleeding or impaired GI absorption (gastrectomy, short bowel, patient on TNA or NPO). ? ?If you have questions about this conversion, please contact the Pharmacy Department  ? ?Benita Gutter, RPH ?07/21/2021 10:38 AM  ?

## 2021-07-21 NOTE — Assessment & Plan Note (Signed)
.  resolved. he is on RA now ?

## 2021-07-21 NOTE — Assessment & Plan Note (Signed)
.  stable H & H ?

## 2021-07-21 NOTE — Hospital Course (Addendum)
51 y.o. male with PMH significant for ESRD on PD, CAD/CABG, chronic systolic CHF, DM 2, HTN, HLD admitted for persistent productive cough worrisome for viral upper respiratory tract infection ? ?4/13: Transfer to floor.  Respiratory panel positive for parainfluenza virus.  Getting CT chest without contrast to evaluate lung parenchyma ?

## 2021-07-21 NOTE — ED Notes (Signed)
Dr. Holley Raring notified pt has bed ready and trying to get ahold of dialysis nurse, pt needs peritoneal dialysis tonight.  Per Dr. Holley Raring, CCU staff to call on-call RN for dialysis.  ?

## 2021-07-22 DIAGNOSIS — R0600 Dyspnea, unspecified: Secondary | ICD-10-CM

## 2021-07-22 DIAGNOSIS — R0902 Hypoxemia: Principal | ICD-10-CM

## 2021-07-22 MED ORDER — MOMETASONE FURO-FORMOTEROL FUM 100-5 MCG/ACT IN AERO
2.0000 | INHALATION_SPRAY | Freq: Two times a day (BID) | RESPIRATORY_TRACT | 0 refills | Status: DC
Start: 2021-07-22 — End: 2021-08-09

## 2021-07-22 MED ORDER — IPRATROPIUM-ALBUTEROL 20-100 MCG/ACT IN AERS
1.0000 | INHALATION_SPRAY | Freq: Four times a day (QID) | RESPIRATORY_TRACT | 0 refills | Status: DC | PRN
Start: 2021-07-22 — End: 2021-08-09

## 2021-07-22 NOTE — Progress Notes (Signed)
SATURATION QUALIFICATIONS: (This note is used to comply with regulatory documentation for home oxygen) ? ?Patient Saturations on Room Air at Rest = 94% ? ?Patient Saturations on Room Air while Ambulating = 88% ? ?Patient Saturations on N/A Liters of oxygen while Ambulating = N/A% ? ?Please briefly explain why patient needs home oxygen:   ? ?No home oxygen required ?

## 2021-07-22 NOTE — TOC Initial Note (Signed)
Transition of Care (TOC) - Initial/Assessment Note  ? ? ?Patient Details  ?Name: Donald [name] ?MRN: [id number] ?Date of Birth: 08-11-1970 ? ?Transition of Care (TOC) CM/SW Contact:    ?Conception Oms, RN ?Phone Number: ?07/22/2021, 9:29 AM ? ?Clinical Narrative:                 ? ? ? Patient from home, Compliant with Dialysis,  ? ?Transition of Care (TOC) Screening Note ? ? ?Patient Details  ?Name: Donald [name] ?Date of Birth: 10-21-1970 ? ? ?Transition of Care (TOC) CM/SW Contact:    ?Conception Oms, RN ?Phone Number: ?07/22/2021, 9:29 AM ? ? ? ?Transition of Care Department [facility]) has reviewed patient and no TOC needs have been identified at this time. We will continue to monitor patient advancement through interdisciplinary progression rounds. If new patient transition needs arise, please place a TOC consult. ?  ?  ? ? ?Patient Goals and CMS Choice ?  ?  ?  ? ?Expected Discharge Plan and Services ?  ?  ?  ?  ?  ?                ?  ?  ?  ?  ?  ?  ?  ?  ?  ?  ? ?Prior Living Arrangements/Services ?  ?  ?  ?       ?  ?  ?  ?  ? ?Activities of Daily Living ?Home Assistive Devices/Equipment: None ?ADL Screening (condition at time of admission) ?Patient's cognitive ability adequate to safely complete daily activities?: Yes ?Is the patient deaf or have difficulty hearing?: No ?Does the patient have difficulty seeing, even when wearing glasses/contacts?: No ?Does the patient have difficulty concentrating, remembering, or making decisions?: No ?Patient able to express need for assistance with ADLs?: Yes ?Does the patient have difficulty dressing or bathing?: Yes ?Independently performs ADLs?: No ?Communication: Independent ?Dressing (OT): Needs assistance ?Is this a change from baseline?: Pre-admission baseline ?Grooming: Independent ?Feeding: Independent ?Is this a change from baseline?: Pre-admission baseline ?Bathing: Needs assistance ?Is this a change from baseline?: Pre-admission baseline ?Toileting: Needs  assistance ?Is this a change from baseline?: Pre-admission baseline ?In/Out Bed: Needs assistance ?Is this a change from baseline?: Pre-admission baseline ?Walks in Home: Needs assistance ?Is this a change from baseline?: Pre-admission baseline ?Does the patient have difficulty walking or climbing stairs?: Yes ?Weakness of Legs: Both ?Weakness of Arms/Hands: Both ? ?Permission Sought/Granted ?  ?  ?   ?   ?   ?   ? ?Emotional Assessment ?  ?  ?  ?  ?  ?  ? ?Admission diagnosis:  SOB (shortness of breath) [R06.02] ?Hypoxia [R09.02] ?Healthcare-associated pneumonia [J18.9] ?Dyspnea, unspecified type [R06.00] ?Congestive heart failure, unspecified HF chronicity, unspecified heart failure type ([place]) [I50.9] ?Patient Active Problem List  ? Diagnosis Date Noted  ? Parainfluenza infection 07/21/2021  ? CAD (coronary artery disease) 07/20/2021  ? SOB (shortness of breath) 07/20/2021  ? Acute respiratory failure with hypoxia ([place]) 07/20/2021  ? Healthcare-associated pneumonia 07/14/2021  ? Anemia due to chronic kidney disease, on chronic dialysis ([place]) 08/23/2020  ? Chronic combined systolic (congestive) and diastolic (congestive) heart failure ([place]) 06/22/2020  ? Acute coronary syndrome with high troponin ([place]) 06/01/2020  ? Normocytic anemia 06/01/2020  ? Prolonged QT interval 06/01/2020  ? Complication of vascular access for dialysis 05/31/2020  ? Age-related nuclear cataract of right eye 05/26/2020  ? Pseudophakia, left eye 05/26/2020  ? Right  epiretinal membrane 05/26/2020  ? Controlled type 2 diabetes mellitus with stable proliferative retinopathy of both eyes, with long-term current use of insulin ([place]) 05/26/2020  ? Primary insomnia 05/20/2020  ? Hyperlipidemia associated with type 2 diabetes mellitus ([place]) 05/20/2020  ? History of amputation of left great toe ([place]) 05/20/2020  ? Elevated troponin   ? GI bleed 01/01/2020  ? Hematemesis 12/31/2019  ? Type 2 diabetes mellitus with peripheral neuropathy ([place]) 12/31/2019   ? Essential hypertension   ? ESRD on peritoneal dialysis [facility])   ? HLD (hyperlipidemia)   ? Hypertensive urgency 07/14/2019  ? Chest pain with high risk for cardiac etiology   ? ?PCP:  Ladell Pier, MD ?Pharmacy:   ?[facility] ([place] - [address] ?[address] ?[place] [address] ?Phone: [phone] Fax: [phone] ? ?[facility] [phone] - [place], [place] ?[place] [address] ?Phone: [phone] Fax: [phone] ? ? ? ? ?Social Determinants of Health (SDOH) Interventions ?  ? ?Readmission Risk Interventions ?   ? View : No data to display.  ?  ?  ?  ? ? ? ?

## 2021-07-23 DIAGNOSIS — Z992 Dependence on renal dialysis: Secondary | ICD-10-CM | POA: Diagnosis not present

## 2021-07-23 DIAGNOSIS — N186 End stage renal disease: Secondary | ICD-10-CM | POA: Diagnosis not present

## 2021-07-23 LAB — CULTURE, RESPIRATORY W GRAM STAIN: Culture: NORMAL

## 2021-07-24 DIAGNOSIS — Z992 Dependence on renal dialysis: Secondary | ICD-10-CM | POA: Diagnosis not present

## 2021-07-24 DIAGNOSIS — N186 End stage renal disease: Secondary | ICD-10-CM | POA: Diagnosis not present

## 2021-07-24 NOTE — Discharge Summary (Signed)
?Physician Discharge Summary ?  ?Patient: Donald [name] MRN: [id number] DOB: 22-Jan-1971  ?Admit date:     07/20/2021  ?Discharge date: 07/22/2021  ?Discharge Physician: Max Sane  ? ?PCP: Ladell Pier, MD  ? ?Recommendations at discharge:  ? ? F/up with outpt providers as requested ? ?Discharge Diagnoses: ?Principal Problem: ?  Parainfluenza infection ?Active Problems: ?  Dyspnea ?  Acute respiratory failure with hypoxia ([place]) ?  Healthcare-associated pneumonia ?  Elevated troponin ?  Type 2 diabetes mellitus with peripheral neuropathy (HCC) ?  Essential hypertension ?  ESRD on peritoneal dialysis [facility]) ?  Anemia due to chronic kidney disease, on chronic dialysis ([place]) ?  CAD (coronary artery disease) ?  Prolonged QT interval ?  Chronic combined systolic (congestive) and diastolic (congestive) heart failure ([place]) ?  Hypoxia ? ?Resolved Problems: ?  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 ([place]) ? ?Hospital Course: ? 51 y.o. male with PMH significant for ESRD on PD, CAD/CABG, chronic systolic CHF, DM 2, HTN, HLD admitted for persistent productive cough worrisome for viral upper respiratory tract infection ? ?4/13: Transfer to floor.  Respiratory panel positive for parainfluenza virus.  Getting CT chest without contrast to evaluate lung parenchyma ? ?Assessment and Plan: ?* Parainfluenza infection ?Likely source of respiratory infection symptomatic mgmt ? ?Acute respiratory failure with hypoxia ([place]) ?.resolved. he is on RA now ? ?Elevated troponin ?.due to demand ischemia ? ?Type 2 diabetes mellitus with peripheral neuropathy (HCC) ?.  Well-controlled ? ?Essential hypertension ?.continue coreg and imdur ? ?ESRD on peritoneal dialysis [facility]) ?[name]  Getting PD per nephrology ? ?Anemia due to chronic kidney disease, on chronic dialysis ([place]) ?.stable H & H ? ?CAD (coronary artery disease) ?[name]  Evaluated by cardiology.  Elevated troponins were thought to be due to demand  ischemia.  EKG does have marked T wave abnormalities as well as ST depression in the inferior and anterolateral leads but considering no anginal symptoms, cardiology recommends outpatient evaluation - outpatient cardiologist Dr. Edwin Dada who he should see within 1 to 2 weeks of discharge. ? ?Chronic combined systolic (congestive) and diastolic (congestive) heart failure (HCC) ?.well compensated at this time ? ? ? ? ?  ? ? ? ?Disposition: Home ?Diet recommendation:  ?Discharge Diet Orders (From admission, onward)  ? ?  Start     Ordered  ? 07/22/21 0000  Diet - low sodium heart healthy       ? 07/22/21 1041  ? ?  ?  ? ?  ? ?Cardiac diet ?DISCHARGE MEDICATION: ?Allergies as of 07/22/2021   ? ?   Reactions  ? Other Swelling  ? Other reaction(s): EGGPLANT ?Facial swelling  ? Lisinopril Hives  ? ?  ? ?  ?Medication List  ?  ? ?STOP taking these medications   ? ?acetaminophen 650 MG CR tablet ?Commonly known as: TYLENOL ?  ?cefdinir 300 MG capsule ?Commonly known as: OMNICEF ?  ?Vitamin D (Ergocalciferol) 1.25 MG (50000 UNIT) Caps capsule ?Commonly known as: DRISDOL ?  ? ?  ? ?TAKE these medications   ? ?aspirin 81 MG EC tablet ?Take 1 tablet (81 mg total) by mouth daily. ?  ?calcitRIOL 0.5 MCG capsule ?Commonly known as: ROCALTROL ?Take 2 mcg by mouth daily. Take 4 tablets (2 mcg) daily. ?  ?calcium acetate 667 MG capsule ?Commonly known as: PHOSLO ?Take 2,001 mg by mouth See admin instructions. Take 2,001 mg with each meal. ?  ?carvedilol 12.5 MG tablet ?Commonly  known as: COREG ?Take 1 tablet (12.5 mg total) by mouth 2 (two) times daily with a meal. ?  ?clopidogrel 75 MG tablet ?Commonly known as: PLAVIX ?Take 1 tablet (75 mg total) by mouth daily. ?  ?dialysis solution 2.5% low-MG/low-CA 394 MOSM/L Soln dianeal solution ?As per nephro ?  ?ezetimibe 10 MG tablet ?Commonly known as: ZETIA ?Take 1 tablet (10 mg total) by mouth daily. ?  ?[place] ?Use to check blood sugar at least 4 times daily.  Change sensor Q 2 wks ?  ?gentamicin cream 0.1 % ?Commonly known as: GARAMYCIN ?Apply 1 application topically every morning. Apply to exit site every day ?  ?insulin lispro 100 UNIT/ML KwikPen ?Commonly known as: HumaLOG KwikPen ?Inject 6 Units into the skin 3 (three) times daily. ?  ?Ipratropium-Albuterol 20-100 MCG/ACT Aers respimat ?Commonly known as: COMBIVENT ?Inhale 1 puff into the lungs every 6 (six) hours as needed for wheezing. ?  ?isosorbide dinitrate 30 MG tablet ?Commonly known as: ISORDIL ?Take 1 tablet (30 mg total) by mouth 2 (two) times daily. ?What changed: when to take this ?  ?Lantus SoloStar 100 UNIT/ML Solostar Pen ?Generic drug: insulin glargine ?ADMINISTER 20 UNITS UNDER THE SKIN DAILY ?What changed: See the new instructions. ?  ?mometasone-formoterol 100-5 MCG/ACT Aero ?Commonly known as: DULERA ?Inhale 2 puffs into the lungs 2 (two) times daily. ?  ?nitroGLYCERIN 0.4 MG SL tablet ?Commonly known as: NITROSTAT ?Place 1 tablet (0.4 mg total) under the tongue every 5 (five) minutes as needed for chest pain. ?  ?polyethylene glycol powder 17 GM/SCOOP powder ?Commonly known as: GLYCOLAX/MIRALAX ?Take 1 Container by mouth daily as needed for mild constipation. ?  ?rosuvastatin 40 MG tablet ?Commonly known as: CRESTOR ?Take 40 mg by mouth in the morning. ?  ?senna 8.6 MG tablet ?Commonly known as: SENOKOT ?Take 2 tablets (17.2 mg total) by mouth daily. ?  ?TRUEplus Pen Needles 31G X 6 MM Misc ?Generic drug: Insulin Pen Needle ?Use to inject Novolog TID and Lantus once daily. Total injections in 1 day = 4. ?  ? ?  ? ?ASK your doctor about these medications   ? ?benzonatate 100 MG capsule ?Commonly known as: TESSALON ?Take 1 capsule (100 mg total) by mouth 3 (three) times daily for 7 days. ?Ask about: Should I take this medication? ?  ?guaiFENesin 100 MG/5ML liquid ?Commonly known as: ROBITUSSIN ?Take 5 mLs by mouth every 4 (four) hours as needed for up to 7 days for cough or to loosen phlegm. ?Ask  about: Should I take this medication? ?  ? ?  ? ? Follow-up Information   ? ? Ladell Pier, MD. Schedule an appointment as soon as possible for a visit in 1 week(s).   ?Specialty: Internal Medicine ?Why: [facility] Discharge F/UP ?Contact information: ?[address] ?[address] ?[place] [address] ?[phone] ? ? ?  ?  ? ? Lolita Patella, MD. Schedule an appointment as soon as possible for a visit in 1 week(s).   ?Specialty: Internal Medicine ?Why: [facility] Discharge F/UP ?Contact information: ?[place] [address] ?[place] [address] ?[phone] ? ? ?  ?  ? ? Ottie Glazier, MD. Schedule an appointment as soon as possible for a visit in 2 week(s).   ?Specialty: Pulmonary Disease ?Why: [facility] Discharge F/UP ?Contact information: ?[address] ?[place] [address] ?[phone] ? ? ?  ?  ? ?  ?  ? ?  ? ?  Discharge Exam: ?Filed Weights  ? 07/21/21 0242 07/21/21 2124 07/22/21 0500  ?Weight: 107.1 kg 102.8 kg 107.9 kg  ? ?Lungs clear to auscultation bilaterally, no wheezing rales rhonchi crepitation ?Cardiovascular S1-S2 normal, no murmur rales or gallop ?Abdomen soft, benign ?Neuro alert and oriented, nonfocal ?Skin no rash or lesion ?Psych normal mood and affect ? ?Condition at discharge: good ? ?The results of significant diagnostics from this hospitalization (including imaging, microbiology, ancillary and laboratory) are listed below for reference.  ? ?Imaging Studies: ?DG Chest 2 View ? ?Result Date: 07/20/2021 ?CLINICAL DATA:  Cough. Shortness of breath. Recent admission for pneumonia on 07/13/2021. EXAM: CHEST - 2 VIEW COMPARISON:  Chest two views 07/13/2021, 03/07/2021 FINDINGS: Status post median sternotomy. Cardiac silhouette is again mildly enlarged. Mediastinal contours are within normal limits with mild calcification within the aortic arch. Minimally improved aeration of the prior opacification of the right infrahilar region with persistent mild  heterogeneous airspace opacification. Mildly worsened small patchy airspace opacity within the left lower lung. No pleural effusion or pneumothorax. Moderate multilevel degenerative disc changes of the thorac

## 2021-07-25 ENCOUNTER — Telehealth: Payer: Self-pay

## 2021-07-25 DIAGNOSIS — Z992 Dependence on renal dialysis: Secondary | ICD-10-CM | POA: Diagnosis not present

## 2021-07-25 DIAGNOSIS — N186 End stage renal disease: Secondary | ICD-10-CM | POA: Diagnosis not present

## 2021-07-25 NOTE — Telephone Encounter (Signed)
Transition Care Management Follow-up Telephone Call ?Date of discharge and from where: 07/22/2021, [facility] ?How have you been since you were released from the hospital? He said he is feeling good.  ?Any questions or concerns? Yes - He reports neuropathy of both hands that " comes and goes" be is occurring more frequently.  He will discuss with Dr Wynetta Emery as upcoming appointment  ? ?Items Reviewed: ?Did the pt receive and understand the discharge instructions provided? Yes  ?Medications obtained and verified? Yes  - he said he has all of his medications and he did not have any questions about the med regime  ?Other? No  ?Any new allergies since your discharge? No  ?Dietary orders reviewed? Yes, he said he was instructed to increase the protein in his diet. ?Do you have support at home? Yes , his wife and daughter. ? ?Home Care and Equipment/Supplies: ?Were home health services ordered? no ?If so, what is the name of the agency? N/a  ?Has the agency set up a time to come to the patient's home? not applicable ?Were any new equipment or medical supplies ordered?  No ?What is the name of the medical supply agency? N/a ?Were you able to get the supplies/equipment? not applicable ?Do you have any questions related to the use of the equipment or supplies? No ? ?He has a scale and glucometer.  He does home peritoneal dialysis nightly ? ?Functional Questionnaire: (I = Independent and D = Dependent) ?ADLs: independent ? ? ?Follow up appointments reviewed: ? ?PCP Hospital f/u appt confirmed?  Yes Scheduled to see Dr Wynetta Emery  - 08/09/2021.  ?[facility] f/u appt confirmed? Yes  Scheduled to see ophthalmology -08/22/2021.  ?Are transportation arrangements needed? No  ?If their condition worsens, is the pt aware to call PCP or go to the Emergency Dept.? Yes ?Was the patient provided with contact information for the PCP's office or ED? Yes ?Was to pt encouraged to call back with questions or concerns?  Yes ? ?

## 2021-07-26 DIAGNOSIS — Z992 Dependence on renal dialysis: Secondary | ICD-10-CM | POA: Diagnosis not present

## 2021-07-26 DIAGNOSIS — N186 End stage renal disease: Secondary | ICD-10-CM | POA: Diagnosis not present

## 2021-07-27 DIAGNOSIS — N186 End stage renal disease: Secondary | ICD-10-CM | POA: Diagnosis not present

## 2021-07-27 DIAGNOSIS — Z992 Dependence on renal dialysis: Secondary | ICD-10-CM | POA: Diagnosis not present

## 2021-07-28 DIAGNOSIS — Z992 Dependence on renal dialysis: Secondary | ICD-10-CM | POA: Diagnosis not present

## 2021-07-28 DIAGNOSIS — N186 End stage renal disease: Secondary | ICD-10-CM | POA: Diagnosis not present

## 2021-07-29 DIAGNOSIS — Z992 Dependence on renal dialysis: Secondary | ICD-10-CM | POA: Diagnosis not present

## 2021-07-29 DIAGNOSIS — N186 End stage renal disease: Secondary | ICD-10-CM | POA: Diagnosis not present

## 2021-07-30 DIAGNOSIS — Z992 Dependence on renal dialysis: Secondary | ICD-10-CM | POA: Diagnosis not present

## 2021-07-30 DIAGNOSIS — N186 End stage renal disease: Secondary | ICD-10-CM | POA: Diagnosis not present

## 2021-07-31 DIAGNOSIS — Z992 Dependence on renal dialysis: Secondary | ICD-10-CM | POA: Diagnosis not present

## 2021-07-31 DIAGNOSIS — N186 End stage renal disease: Secondary | ICD-10-CM | POA: Diagnosis not present

## 2021-08-01 DIAGNOSIS — Z992 Dependence on renal dialysis: Secondary | ICD-10-CM | POA: Diagnosis not present

## 2021-08-01 DIAGNOSIS — N186 End stage renal disease: Secondary | ICD-10-CM | POA: Diagnosis not present

## 2021-08-02 DIAGNOSIS — N186 End stage renal disease: Secondary | ICD-10-CM | POA: Diagnosis not present

## 2021-08-02 DIAGNOSIS — Z992 Dependence on renal dialysis: Secondary | ICD-10-CM | POA: Diagnosis not present

## 2021-08-03 DIAGNOSIS — Z992 Dependence on renal dialysis: Secondary | ICD-10-CM | POA: Diagnosis not present

## 2021-08-03 DIAGNOSIS — N186 End stage renal disease: Secondary | ICD-10-CM | POA: Diagnosis not present

## 2021-08-04 DIAGNOSIS — Z992 Dependence on renal dialysis: Secondary | ICD-10-CM | POA: Diagnosis not present

## 2021-08-04 DIAGNOSIS — N186 End stage renal disease: Secondary | ICD-10-CM | POA: Diagnosis not present

## 2021-08-05 DIAGNOSIS — Z992 Dependence on renal dialysis: Secondary | ICD-10-CM | POA: Diagnosis not present

## 2021-08-05 DIAGNOSIS — N186 End stage renal disease: Secondary | ICD-10-CM | POA: Diagnosis not present

## 2021-08-06 DIAGNOSIS — Z992 Dependence on renal dialysis: Secondary | ICD-10-CM | POA: Diagnosis not present

## 2021-08-06 DIAGNOSIS — N186 End stage renal disease: Secondary | ICD-10-CM | POA: Diagnosis not present

## 2021-08-07 DIAGNOSIS — N186 End stage renal disease: Secondary | ICD-10-CM | POA: Diagnosis not present

## 2021-08-07 DIAGNOSIS — Z992 Dependence on renal dialysis: Secondary | ICD-10-CM | POA: Diagnosis not present

## 2021-08-08 DIAGNOSIS — N186 End stage renal disease: Secondary | ICD-10-CM | POA: Diagnosis not present

## 2021-08-08 DIAGNOSIS — Z992 Dependence on renal dialysis: Secondary | ICD-10-CM | POA: Diagnosis not present

## 2021-08-09 ENCOUNTER — Ambulatory Visit: Payer: Medicare Other | Attending: Internal Medicine | Admitting: Internal Medicine

## 2021-08-09 VITALS — BP 130/78 | HR 74 | Temp 97.9°F | Resp 20 | Wt 242.0 lb

## 2021-08-09 DIAGNOSIS — Z09 Encounter for follow-up examination after completed treatment for conditions other than malignant neoplasm: Secondary | ICD-10-CM | POA: Diagnosis not present

## 2021-08-09 DIAGNOSIS — I25718 Atherosclerosis of autologous vein coronary artery bypass graft(s) with other forms of angina pectoris: Secondary | ICD-10-CM

## 2021-08-09 DIAGNOSIS — R918 Other nonspecific abnormal finding of lung field: Secondary | ICD-10-CM | POA: Diagnosis not present

## 2021-08-09 DIAGNOSIS — I129 Hypertensive chronic kidney disease with stage 1 through stage 4 chronic kidney disease, or unspecified chronic kidney disease: Secondary | ICD-10-CM | POA: Diagnosis not present

## 2021-08-09 DIAGNOSIS — B348 Other viral infections of unspecified site: Secondary | ICD-10-CM | POA: Diagnosis not present

## 2021-08-09 DIAGNOSIS — E1142 Type 2 diabetes mellitus with diabetic polyneuropathy: Secondary | ICD-10-CM

## 2021-08-09 DIAGNOSIS — Z992 Dependence on renal dialysis: Secondary | ICD-10-CM

## 2021-08-09 DIAGNOSIS — N186 End stage renal disease: Secondary | ICD-10-CM

## 2021-08-09 DIAGNOSIS — I5020 Unspecified systolic (congestive) heart failure: Secondary | ICD-10-CM

## 2021-08-09 LAB — GLUCOSE, POCT (MANUAL RESULT ENTRY): POC Glucose: 143 mg/dl — AB (ref 70–99)

## 2021-08-09 MED ORDER — MOMETASONE FURO-FORMOTEROL FUM 100-5 MCG/ACT IN AERO: 2.0000 | INHALATION_SPRAY | Freq: Two times a day (BID) | RESPIRATORY_TRACT | 0 refills | Status: AC

## 2021-08-09 MED ORDER — GABAPENTIN 100 MG PO CAPS: ORAL_CAPSULE | ORAL | 3 refills | Status: AC

## 2021-08-09 MED ORDER — IPRATROPIUM-ALBUTEROL 20-100 MCG/ACT IN AERS: 1.0000 | INHALATION_SPRAY | Freq: Four times a day (QID) | RESPIRATORY_TRACT | 1 refills | Status: AC | PRN

## 2021-08-09 MED ORDER — GABAPENTIN 100 MG PO CAPS: 100.0000 mg | ORAL_CAPSULE | Freq: Two times a day (BID) | ORAL | 3 refills | Status: DC

## 2021-08-09 NOTE — Progress Notes (Signed)
Hand pain, intermittent, 8/10 at the worse ?No relief with medications  ? ?F/u PNA ? ?insomnia ?

## 2021-08-09 NOTE — Progress Notes (Signed)
? ? ?Patient ID: Donald [name], male    DOB: 06-Dec-1970  MRN: [id number] ? ?CC: hosp f/u ? ?Subjective: ?Donald [name] is a 51 y.o. male who presents for hosp f/u.  He is alone but has his wife on speaker phone on his cell phone. 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  ? ?Patient hospitalized 4/12-14/2023 for persistent productive cough with hypoxia.  Found to have parainfluenza infection.  CT of the chest without contrast revealed bilateral patchy infiltrative changes with some nodularity.  Radiologist stated that these changes were most consistent with multifocal infiltrate although more aggressive process could not be totally excluded.  Recommend short-term follow-up CT in 6 to 8 weeks.  Patient was treated symptomatically.  Found to have elevated troponins.  Thought to be due to demand ischemia.  Cardiology recommended outpatient follow-up with Dr. Edwin Dada. ? ?Today: ?Patient reports that he still has a lingering cough but was told that cough can last for several weeks post viral infection.  No fever since hospital discharge.   ?-Does not have a pulse ox device at home to check oxygen level. ?Discharged with Dulera inhaler and Combivent.  He finds the [facility] to be helpful.  He is not sure whether the Combivent is getting into his lungs.  States he does not feel anything when he uses it.  Thinks he may not be using correctly.  Does not have inhalers with him. ? ?He reports shortness of breath with minimal exertion and when laying down at night.  Reports this is chronic.  He has combined chronic CHF and CAD.  Endorses chest pains if he tries to do too much walking or lifting.  Last used nitroglycerin 2 days ago. ?Sleeps on 3 pillows.  Endorses poor sleep due to chronic PND and orthopnea which have not  progressed.   ?No swelling in the legs.  He continues with home peritoneal dialysis.  He states that most time he is able to get to his dried weight of 107.5 kg.  He does not make any urine.  He is not on any fluid pills.  Recent echo revealed mild improvement in EF from 35 to 40% now to 40-45% with global hypokinesis.  He has follow-up with his cardiologist at [facility]. ? ?DM: Reports compliance with taking Lantus 20 units daily and Humalog 6 units with meals.  Reports blood sugars have been good.  He has a Database administrator.  No low blood sugar episodes. ?Complains of neuropathy pains in both hands.  He describes it as shooting pains in the hands.  Also states that when he was in the hospital and line was placed in right hand.  He thinks that the needle may have hit a nerve in his hand because it sends a shock and caused intermittent tremors.  Reports being on gabapentin in the past for neuropathy. ?Results for orders placed or performed in visit on 08/09/21  ?Glucose (CBG)  ?Result Value Ref Range  ? POC Glucose 143 (A) 70 - 99 mg/dl  ? ?Lab Results  ?Component Value Date  ? HGBA1C 5.7 (H) 07/21/2021  ? ? ?Patient Active Problem List  ? Diagnosis Date Noted  ? Hypoxia   ? Parainfluenza infection 07/21/2021  ? CAD (coronary artery disease) 07/20/2021  ? Dyspnea 07/20/2021  ? Acute respiratory  failure with hypoxia ([place]) 07/20/2021  ? Healthcare-associated pneumonia 07/14/2021  ? Anemia due to chronic kidney disease, on chronic dialysis ([place]) 08/23/2020  ? Chronic combined systolic (congestive) and diastolic (congestive) heart failure ([place]) 06/22/2020  ? Acute coronary syndrome with high troponin ([place]) 06/01/2020  ? Normocytic anemia 06/01/2020  ? Prolonged QT interval 06/01/2020  ? Complication of vascular access for dialysis 05/31/2020  ? Age-related nuclear cataract of right eye 05/26/2020  ? Pseudophakia, left eye 05/26/2020  ? Right epiretinal membrane 05/26/2020  ? Controlled type 2 diabetes mellitus with  stable proliferative retinopathy of both eyes, with long-term current use of insulin ([place]) 05/26/2020  ? Primary insomnia 05/20/2020  ? Hyperlipidemia associated with type 2 diabetes mellitus ([place]) 05/20/2020  ? History of amputation of left great toe ([place]) 05/20/2020  ? Elevated troponin   ? GI bleed 01/01/2020  ? Hematemesis 12/31/2019  ? Type 2 diabetes mellitus with peripheral neuropathy ([place]) 12/31/2019  ? Essential hypertension   ? ESRD on peritoneal dialysis [facility])   ? HLD (hyperlipidemia)   ? Hypertensive urgency 07/14/2019  ? Chest pain with high risk for cardiac etiology   ?  ? ?Current Outpatient Medications on File Prior to Visit  ?Medication Sig Dispense Refill  ? aspirin EC 81 MG EC tablet Take 1 tablet (81 mg total) by mouth daily. 30 tablet 3  ? calcitRIOL (ROCALTROL) 0.5 MCG capsule Take 2 mcg by mouth daily. Take 4 tablets (2 mcg) daily.    ? calcium acetate (PHOSLO) 667 MG capsule Take 2,001 mg by mouth See admin instructions. Take 2,001 mg with each meal.    ? carvedilol (COREG) 12.5 MG tablet Take 1 tablet (12.5 mg total) by mouth 2 (two) times daily with a meal. 180 tablet 3  ? clopidogrel (PLAVIX) 75 MG tablet Take 1 tablet (75 mg total) by mouth daily. 90 tablet 3  ? Continuous Blood Gluc Sensor (FREESTYLE LIBRE SENSOR SYSTEM) MISC Use to check blood sugar at least 4 times daily. Change sensor Q 2 wks 2 each 12  ? gentamicin cream (GARAMYCIN) 0.1 % Apply 1 application topically every morning. Apply to exit site every day    ? insulin lispro (HUMALOG KWIKPEN) 100 UNIT/ML KwikPen Inject 6 Units into the skin 3 (three) times daily. 15 mL 11  ? Insulin Pen Needle (TRUEPLUS PEN NEEDLES) 31G X 6 MM MISC Use to inject Novolog TID and Lantus once daily. Total injections in 1 day = 4. 100 each 6  ? isosorbide dinitrate (ISORDIL) 30 MG tablet Take 1 tablet (30 mg total) by mouth 2 (two) times daily. (Patient taking differently: Take 30 mg by mouth daily.) 60 tablet 11  ? LANTUS SOLOSTAR 100 UNIT/ML  Solostar Pen ADMINISTER 20 UNITS UNDER THE SKIN DAILY (Patient taking differently: Inject 20 Units into the skin at bedtime.) 15 mL 1  ? nitroGLYCERIN (NITROSTAT) 0.4 MG SL tablet Place 1 tablet (0.4 mg total) under the tongue every 5 (five) minutes as needed for chest pain. 25 tablet 12  ? Peritoneal Dialysis Solutions (DIALYSIS SOLUTION 2.5% LOW-MG/LOW-CA) 394 MOSM/L SOLN dianeal solution As per nephro  0  ? polyethylene glycol powder (GLYCOLAX/MIRALAX) 17 GM/SCOOP powder Take 1 Container by mouth daily as needed for mild constipation.    ? rosuvastatin (CRESTOR) 40 MG tablet Take 40 mg by mouth in the morning.    ? senna (SENOKOT) 8.6 MG tablet Take 2 tablets (17.2 mg total) by mouth daily. 30 tablet 3  ? ezetimibe (ZETIA) 10 MG  tablet Take 1 tablet (10 mg total) by mouth daily. 90 tablet 3  ? ?No current facility-administered medications on file prior to visit.  ? ? ?Allergies  ?Allergen Reactions  ? Other Swelling  ?  Other reaction(s): EGGPLANT ?Facial swelling  ? Lisinopril Hives  ? ? ?Social History  ? ?Socioeconomic History  ? Marital status: Married  ?  Spouse name: Not on file  ? Number of children: 6  ? Years of education: Not on file  ? Highest education level: Some college, no degree  ?Occupational History  ? Occupation: disabled  ?Tobacco Use  ? Smoking status: Never  ? Smokeless tobacco: Never  ?Vaping Use  ? Vaping Use: Never used  ?Substance and Sexual Activity  ? Alcohol use: Yes  ?  Comment: occasionally  ? Drug use: Yes  ?  Types: Marijuana  ?  Comment: occasionally  ? Sexual activity: Not on file  ?Other Topics Concern  ? Not on file  ?Social History Narrative  ? Not on file  ? ?Social Determinants of Health  ? ?Financial Resource Strain: Low Risk   ? Difficulty of Paying Living Expenses: Not very hard  ?Food Insecurity: No Food Insecurity  ? Worried About Charity fundraiser in the Last Year: Never true  ? Ran Out of Food in the Last Year: Never true  ?Transportation Needs: No Transportation  Needs  ? Lack of Transportation (Medical): No  ? Lack of Transportation (Non-Medical): No  ?Physical Activity: Insufficiently Active  ? Days of Exercise per Week: 3 days  ? Minutes of Exercise per Session: 30 min  ?S

## 2021-08-10 DIAGNOSIS — Z992 Dependence on renal dialysis: Secondary | ICD-10-CM | POA: Diagnosis not present

## 2021-08-10 DIAGNOSIS — N186 End stage renal disease: Secondary | ICD-10-CM | POA: Diagnosis not present

## 2021-08-11 DIAGNOSIS — Z992 Dependence on renal dialysis: Secondary | ICD-10-CM | POA: Diagnosis not present

## 2021-08-11 DIAGNOSIS — N186 End stage renal disease: Secondary | ICD-10-CM | POA: Diagnosis not present

## 2021-08-12 DIAGNOSIS — Z992 Dependence on renal dialysis: Secondary | ICD-10-CM | POA: Diagnosis not present

## 2021-08-12 DIAGNOSIS — N186 End stage renal disease: Secondary | ICD-10-CM | POA: Diagnosis not present

## 2021-08-13 DIAGNOSIS — Z992 Dependence on renal dialysis: Secondary | ICD-10-CM | POA: Diagnosis not present

## 2021-08-13 DIAGNOSIS — N186 End stage renal disease: Secondary | ICD-10-CM | POA: Diagnosis not present

## 2021-08-14 DIAGNOSIS — Z992 Dependence on renal dialysis: Secondary | ICD-10-CM | POA: Diagnosis not present

## 2021-08-14 DIAGNOSIS — N186 End stage renal disease: Secondary | ICD-10-CM | POA: Diagnosis not present

## 2021-08-15 DIAGNOSIS — Z992 Dependence on renal dialysis: Secondary | ICD-10-CM | POA: Diagnosis not present

## 2021-08-15 DIAGNOSIS — N186 End stage renal disease: Secondary | ICD-10-CM | POA: Diagnosis not present

## 2021-08-16 DIAGNOSIS — Z992 Dependence on renal dialysis: Secondary | ICD-10-CM | POA: Diagnosis not present

## 2021-08-16 DIAGNOSIS — N186 End stage renal disease: Secondary | ICD-10-CM | POA: Diagnosis not present

## 2021-08-17 DIAGNOSIS — Z992 Dependence on renal dialysis: Secondary | ICD-10-CM | POA: Diagnosis not present

## 2021-08-17 DIAGNOSIS — N186 End stage renal disease: Secondary | ICD-10-CM | POA: Diagnosis not present

## 2021-08-18 DIAGNOSIS — Z992 Dependence on renal dialysis: Secondary | ICD-10-CM | POA: Diagnosis not present

## 2021-08-18 DIAGNOSIS — N186 End stage renal disease: Secondary | ICD-10-CM | POA: Diagnosis not present

## 2021-08-19 DIAGNOSIS — Z992 Dependence on renal dialysis: Secondary | ICD-10-CM | POA: Diagnosis not present

## 2021-08-19 DIAGNOSIS — N186 End stage renal disease: Secondary | ICD-10-CM | POA: Diagnosis not present

## 2021-08-20 DIAGNOSIS — Z992 Dependence on renal dialysis: Secondary | ICD-10-CM | POA: Diagnosis not present

## 2021-08-20 DIAGNOSIS — N186 End stage renal disease: Secondary | ICD-10-CM | POA: Diagnosis not present

## 2021-08-21 DIAGNOSIS — N186 End stage renal disease: Secondary | ICD-10-CM | POA: Diagnosis not present

## 2021-08-21 DIAGNOSIS — Z992 Dependence on renal dialysis: Secondary | ICD-10-CM | POA: Diagnosis not present

## 2021-08-22 ENCOUNTER — Ambulatory Visit (INDEPENDENT_AMBULATORY_CARE_PROVIDER_SITE_OTHER): Payer: Medicare Other | Admitting: Ophthalmology

## 2021-08-22 ENCOUNTER — Encounter (INDEPENDENT_AMBULATORY_CARE_PROVIDER_SITE_OTHER): Payer: Self-pay | Admitting: Ophthalmology

## 2021-08-22 DIAGNOSIS — N186 End stage renal disease: Secondary | ICD-10-CM | POA: Diagnosis not present

## 2021-08-22 DIAGNOSIS — H2511 Age-related nuclear cataract, right eye: Secondary | ICD-10-CM | POA: Diagnosis not present

## 2021-08-22 DIAGNOSIS — R0683 Snoring: Secondary | ICD-10-CM | POA: Diagnosis not present

## 2021-08-22 DIAGNOSIS — E113553 Type 2 diabetes mellitus with stable proliferative diabetic retinopathy, bilateral: Secondary | ICD-10-CM | POA: Diagnosis not present

## 2021-08-22 DIAGNOSIS — H35371 Puckering of macula, right eye: Secondary | ICD-10-CM

## 2021-08-22 DIAGNOSIS — Z794 Long term (current) use of insulin: Secondary | ICD-10-CM | POA: Diagnosis not present

## 2021-08-22 DIAGNOSIS — Z992 Dependence on renal dialysis: Secondary | ICD-10-CM | POA: Diagnosis not present

## 2021-08-22 NOTE — Assessment & Plan Note (Signed)
Moderate cataract in the right eye still with good acuity.  Nonetheless darkening of his vision may be happening at some time in the near future. ? ? ?

## 2021-08-22 NOTE — Assessment & Plan Note (Signed)
Moderate topographic distortion but no thickening.  Okay to continue to observe monitor this condition no active CSME as well ?

## 2021-08-22 NOTE — Assessment & Plan Note (Signed)
PRP OU.  Good quiet PDR.The nature of regressed proliferative diabetic retinopathy was discussed with the patient. The patient was advised to maintain good glucose, blood pressure, monitor kidney function and serum lipid control as advised by personal physician. Rare risk for reactivation of progression exist with untreated severe anemia, untreated renal failure, untreated heart failure, and smoking. ?Complete avoidance of smoking was recommended. The chance of recurrent proliferative diabetic retinopathy was discussed as well as the chance of vitreous hemorrhage for which further treatments may be necessary.   Explained to the patient that the quiescent  proliferative diabetic retinopathy disease is unlikely to ever worsen.  Worsening factors would include however severe anemia, hypertension out-of-control or impending renal failure. ?

## 2021-08-22 NOTE — Assessment & Plan Note (Signed)
Patient does have review of system for sleep apnea.  Upon asking if he has been diagnosed he "says I think I have it". ? ?He is in fact tired all the time often naps in the afternoon awakens 2 or 3 times at night but is not with nocturia since he is on dialysis. ? ?Thus he probably does have some form or some mild or moderate degree or worse of sleep apnea. ? ?Of asked the patient to contact his primary care physician if the symptoms are appropriate and asking request for sleep study whether home sleep study or otherwise. ? ?I encouraged the patient that his sleep apnea is is diagnosed that if he takes every measure to make the treatment work he is likely to improve his sense of wellbeing, sleep patterns and energy levels as well as mood ?

## 2021-08-22 NOTE — Progress Notes (Signed)
? ? ?08/22/2021 ? ?  ? ?CHIEF COMPLAINT ?Patient presents for  ?Chief Complaint  ?Patient presents with  ? Retina Follow Up  ? ? 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  Cataract in the right eye as well. ? ?HISTORY OF PRESENT ILLNESS: ?Donald [name] is a 51 y.o. male who presents to the clinic today for:  ? ?HPI   ? ? Retina Follow Up   ? ?      ? Diagnosis: Diabetic Retinopathy  ? Laterality: both eyes  ? Onset: 9 months ago  ? ?  ?  ? ? Comments   ?8 mos fu OU oct fp. ?Patient states vision is stable and unchanged since last visit. Denies any new floaters or FOL. ?LBS: unknown ?A1C: "It was like 6.5, last month," per patient. ? ?  ?  ?Last edited by Laurin Coder on 08/22/2021  1:14 PM.  ?  ? ? ?Referring physician: ?Ladell Pier, MD ?[address] ?[address] ?[place],  [place] [address] ? ?HISTORICAL INFORMATION:  ? ?Selected notes from the [facility] ?  ? ?Lab Results  ?Component Value Date  ? HGBA1C 5.7 (H) 07/21/2021  ?  ? ?CURRENT MEDICATIONS: ?No current outpatient medications on file. (Ophthalmic Drugs)  ? ?No current facility-administered medications for this visit. (Ophthalmic Drugs)  ? ?Current Outpatient Medications (Other)  ?Medication Sig  ? aspirin EC 81 MG EC tablet Take 1 tablet (81 mg total) by mouth daily.  ? calcitRIOL (ROCALTROL) 0.5 MCG capsule Take 2 mcg by mouth daily. Take 4 tablets (2 mcg) daily.  ? calcium acetate (PHOSLO) 667 MG capsule Take 2,001 mg by mouth See admin instructions. Take 2,001 mg with each meal.  ? carvedilol (COREG) 12.5 MG tablet Take 1 tablet (12.5 mg total) by mouth 2 (two) times daily with a meal.  ? clopidogrel (PLAVIX) 75 MG tablet Take 1 tablet (75 mg total) by mouth daily.  ? Continuous Blood Gluc Sensor (FREESTYLE LIBRE SENSOR SYSTEM) MISC Use to check blood sugar at least 4 times daily. Change sensor Q 2 wks  ? ezetimibe (ZETIA) 10 MG tablet Take 1 tablet (10 mg total) by mouth daily.  ? gabapentin  (NEURONTIN) 100 MG capsule 1 cap PO every other day  ? gentamicin cream (GARAMYCIN) 0.1 % Apply 1 application topically every morning. Apply to exit site every day  ? insulin lispro (HUMALOG KWIKPEN) 100 UNIT/ML KwikPen Inject 6 Units into the skin 3 (three) times daily.  ? Insulin Pen Needle (TRUEPLUS PEN NEEDLES) 31G X 6 MM MISC Use to inject Novolog TID and Lantus once daily. Total injections in 1 day = 4.  ? Ipratropium-Albuterol (COMBIVENT) 20-100 MCG/ACT AERS respimat Inhale 1 puff into the lungs every 6 (six) hours as needed for wheezing.  ? isosorbide dinitrate (ISORDIL) 30 MG tablet Take 1 tablet (30 mg total) by mouth 2 (two) times daily. (Patient taking differently: Take 30 mg by mouth daily.)  ? LANTUS SOLOSTAR 100 UNIT/ML Solostar Pen ADMINISTER 20 UNITS UNDER THE SKIN DAILY (Patient taking differently: Inject 20 Units into the skin at bedtime.)  ? mometasone-formoterol (DULERA) 100-5 MCG/ACT AERO Inhale 2 puffs into the lungs 2 (two) times daily.  ? nitroGLYCERIN (NITROSTAT) 0.4 MG SL tablet Place 1 tablet (0.4 mg total) under the tongue every 5 (five) minutes as needed for chest pain.  ? Peritoneal Dialysis Solutions (DIALYSIS SOLUTION 2.5% LOW-MG/LOW-CA) 394 MOSM/L SOLN dianeal solution  As per nephro  ? polyethylene glycol powder (GLYCOLAX/MIRALAX) 17 GM/SCOOP powder Take 1 Container by mouth daily as needed for mild constipation.  ? rosuvastatin (CRESTOR) 40 MG tablet Take 40 mg by mouth in the morning.  ? senna (SENOKOT) 8.6 MG tablet Take 2 tablets (17.2 mg total) by mouth daily.  ? ?No current facility-administered medications for this visit. (Other)  ? ? ? ? ?REVIEW OF SYSTEMS: ?ROS   ?Negative for: Constitutional, Gastrointestinal, Neurological, Skin, Genitourinary, Musculoskeletal, HENT, Endocrine, Cardiovascular, Eyes, Respiratory, Psychiatric, Allergic/Imm, Heme/Lymph ?Last edited by Hurman Horn, MD on 08/22/2021  1:58 PM.  ?  ? ? ? ?ALLERGIES ?Allergies  ?Allergen Reactions  ? Other  Swelling  ?  Other reaction(s): EGGPLANT ?Facial swelling  ? Lisinopril Hives  ? ? ?PAST MEDICAL HISTORY ?Past Medical History:  ?Diagnosis Date  ? CAD (coronary artery disease)   ? S/p CABG in 2016 in [place] //cath 2/22: Patent grafts; PDA/PLA branch is occluded beyond graft insertion; severe diffuse disease in native AV circumflex supplies small amount of myocardium-if refractory angina, PCI could be considered>> adequate therapy for now  ? Diabetes mellitus without complication ([place])   ? ESRD on peritoneal dialysis [facility])   ? dialysis   ? HFrEF (heart failure with reduced ejection fraction) ([place])   ? EF 35-40  ? History of vitrectomy 2014  ? anterior, left  ? Hx of heart bypass surgery   ? Hyperlipidemia   ? Hypertension   ? ?Past Surgical History:  ?Procedure Laterality Date  ? AV FISTULA PLACEMENT    ? CARDIAC SURGERY    ? COLONOSCOPY WITH PROPOFOL N/A 02/11/2020  ? Procedure: COLONOSCOPY WITH PROPOFOL;  Surgeon: Toledo, Benay Pike, MD;  Location: [facility] ENDOSCOPY;  Service: Gastroenterology;  Laterality: N/A;  ? CORONARY ARTERY BYPASS GRAFT  2016  ? ESOPHAGOGASTRODUODENOSCOPY (EGD) WITH PROPOFOL N/A 01/02/2020  ? Procedure: ESOPHAGOGASTRODUODENOSCOPY (EGD) WITH PROPOFOL;  Surgeon: Lesly Rubenstein, MD;  Location: [facility] ENDOSCOPY;  Service: Endoscopy;  Laterality: N/A;  ? INSERTION OF DIALYSIS CATHETER    ? left great toe amputation    ? LEFT HEART CATH AND CORS/GRAFTS ANGIOGRAPHY N/A 06/03/2020  ? Procedure: LEFT HEART CATH AND CORS/GRAFTS ANGIOGRAPHY;  Surgeon: Sherren Mocha, MD;  Location: [facility];  Service: Cardiovascular;  Laterality: N/A;  ? VITRECTOMY Left 2014  ? ? ?FAMILY HISTORY ?Family History  ?Problem Relation Age of Onset  ? Diabetes Mellitus II Mother   ? Heart disease Mother   ? Diabetes Mother   ? Hypertension Mother   ? Kidney failure Mother   ? Diabetes Maternal Grandmother   ? Hyperlipidemia Maternal Grandmother   ? Dementia Maternal Grandmother   ? ? ?SOCIAL HISTORY ?Social  History  ? ?Tobacco Use  ? Smoking status: Never  ? Smokeless tobacco: Never  ?Vaping Use  ? Vaping Use: Never used  ?Substance Use Topics  ? Alcohol use: Yes  ?  Comment: occasionally  ? Drug use: Yes  ?  Types: Marijuana  ?  Comment: occasionally  ? ?  ? ?  ? ?OPHTHALMIC EXAM: ? ?Base Eye Exam   ? ? Visual Acuity (ETDRS)   ? ?   Right Left  ? Dist cc 20/30 -1 20/200 -1  ? Dist ph cc 20/25 -1 NI  ? ? Correction: Glasses  ? ?  ?  ? ? Tonometry (Tonopen, 1:18 PM)   ? ?   Right Left  ? Pressure 10 10  ? ?  ?  ? ?  Pupils   ? ?   Pupils Dark Light React APD  ? Right PERRL 2 2 Minimal None  ? Left PERRL 2 2 Minimal None  ? ?  ?  ? ? Visual Fields (Counting fingers)   ? ?   Left Right  ?   Full  ? Restrictions Partial outer inferior temporal deficiency   ? ?  ?  ? ? Extraocular Movement   ? ?   Right Left  ?  Full Full  ? ?  ?  ? ? Neuro/Psych   ? ? Oriented x3: Yes  ? Mood/Affect: Normal  ? ?  ?  ? ? Dilation   ? ? Both eyes: 1.0% Mydriacyl, 2.5% Phenylephrine @ 1:18 PM  ? ?  ?  ? ?  ? ?Slit Lamp and Fundus Exam   ? ? External Exam   ? ?   Right Left  ? External Normal Normal  ? ?  ?  ? ? Slit Lamp Exam   ? ?   Right Left  ? Lids/Lashes Normal Normal  ? Conjunctiva/Sclera White and quiet White and quiet  ? Cornea Clear Clear  ? Anterior Chamber Deep and quiet Deep and quiet  ? Iris Round and reactive Round and reactive  ? Lens 2+ Nuclear sclerosis Centered posterior chamber intraocular lens, Open posterior capsule  ? Anterior Vitreous Normal Normal  ? ?  ?  ? ? Fundus Exam   ? ?   Right Left  ? Posterior Vitreous Partial, low-lying Clear, Avitric  ? Disc Vit pap traction, old FPD Pallor 1+  ? C/D Ratio 0.3 0.65  ? Macula Microaneurysms, Exudates, Epiretinal membrane moderate Epiretinal membrane moderate, Focal laser scars  ? Vessels PDR-quiet PDR-quiet  ? Periphery Good PRP, traction ST, traction IT with a large sheet of fibrotic posterior hyaloid partially elevated with focal tractional attachments, not a retinal  detachment, not atrophic retina, no active NVE Good PRP 360  ? ?  ?  ? ?  ? ? ?IMAGING AND PROCEDURES  ?Imaging and Procedures for 08/22/21 ? ?OCT, Retina - OU - Both Eyes   ? ?   ?Right Eye ?Quality was good. Scan

## 2021-08-23 DIAGNOSIS — N186 End stage renal disease: Secondary | ICD-10-CM | POA: Diagnosis not present

## 2021-08-23 DIAGNOSIS — Z992 Dependence on renal dialysis: Secondary | ICD-10-CM | POA: Diagnosis not present

## 2021-08-24 DIAGNOSIS — N186 End stage renal disease: Secondary | ICD-10-CM | POA: Diagnosis not present

## 2021-08-24 DIAGNOSIS — Z992 Dependence on renal dialysis: Secondary | ICD-10-CM | POA: Diagnosis not present

## 2021-08-25 DIAGNOSIS — Z992 Dependence on renal dialysis: Secondary | ICD-10-CM | POA: Diagnosis not present

## 2021-08-25 DIAGNOSIS — N186 End stage renal disease: Secondary | ICD-10-CM | POA: Diagnosis not present

## 2021-08-26 DIAGNOSIS — Z992 Dependence on renal dialysis: Secondary | ICD-10-CM | POA: Diagnosis not present

## 2021-08-26 DIAGNOSIS — N186 End stage renal disease: Secondary | ICD-10-CM | POA: Diagnosis not present

## 2021-08-27 DIAGNOSIS — Z992 Dependence on renal dialysis: Secondary | ICD-10-CM | POA: Diagnosis not present

## 2021-08-27 DIAGNOSIS — N186 End stage renal disease: Secondary | ICD-10-CM | POA: Diagnosis not present

## 2021-08-28 DIAGNOSIS — N186 End stage renal disease: Secondary | ICD-10-CM | POA: Diagnosis not present

## 2021-08-28 DIAGNOSIS — Z992 Dependence on renal dialysis: Secondary | ICD-10-CM | POA: Diagnosis not present

## 2021-08-29 ENCOUNTER — Ambulatory Visit: Payer: Medicare Other | Admitting: Internal Medicine

## 2021-08-29 DIAGNOSIS — N186 End stage renal disease: Secondary | ICD-10-CM | POA: Diagnosis not present

## 2021-08-29 DIAGNOSIS — Z992 Dependence on renal dialysis: Secondary | ICD-10-CM | POA: Diagnosis not present

## 2021-08-30 DIAGNOSIS — Z992 Dependence on renal dialysis: Secondary | ICD-10-CM | POA: Diagnosis not present

## 2021-08-30 DIAGNOSIS — N186 End stage renal disease: Secondary | ICD-10-CM | POA: Diagnosis not present

## 2021-08-31 DIAGNOSIS — Z992 Dependence on renal dialysis: Secondary | ICD-10-CM | POA: Diagnosis not present

## 2021-08-31 DIAGNOSIS — N186 End stage renal disease: Secondary | ICD-10-CM | POA: Diagnosis not present

## 2021-09-01 DIAGNOSIS — Z992 Dependence on renal dialysis: Secondary | ICD-10-CM | POA: Diagnosis not present

## 2021-09-01 DIAGNOSIS — N186 End stage renal disease: Secondary | ICD-10-CM | POA: Diagnosis not present

## 2021-09-02 DIAGNOSIS — Z992 Dependence on renal dialysis: Secondary | ICD-10-CM | POA: Diagnosis not present

## 2021-09-02 DIAGNOSIS — N186 End stage renal disease: Secondary | ICD-10-CM | POA: Diagnosis not present

## 2021-09-03 DIAGNOSIS — Z992 Dependence on renal dialysis: Secondary | ICD-10-CM | POA: Diagnosis not present

## 2021-09-03 DIAGNOSIS — N186 End stage renal disease: Secondary | ICD-10-CM | POA: Diagnosis not present

## 2021-09-04 DIAGNOSIS — Z992 Dependence on renal dialysis: Secondary | ICD-10-CM | POA: Diagnosis not present

## 2021-09-04 DIAGNOSIS — N186 End stage renal disease: Secondary | ICD-10-CM | POA: Diagnosis not present

## 2021-09-05 DIAGNOSIS — N186 End stage renal disease: Secondary | ICD-10-CM | POA: Diagnosis not present

## 2021-09-05 DIAGNOSIS — Z992 Dependence on renal dialysis: Secondary | ICD-10-CM | POA: Diagnosis not present

## 2021-09-06 DIAGNOSIS — N186 End stage renal disease: Secondary | ICD-10-CM | POA: Diagnosis not present

## 2021-09-06 DIAGNOSIS — Z992 Dependence on renal dialysis: Secondary | ICD-10-CM | POA: Diagnosis not present

## 2021-09-07 DIAGNOSIS — Z992 Dependence on renal dialysis: Secondary | ICD-10-CM | POA: Diagnosis not present

## 2021-09-07 DIAGNOSIS — N186 End stage renal disease: Secondary | ICD-10-CM | POA: Diagnosis not present

## 2021-09-08 ENCOUNTER — Emergency Department (HOSPITAL_COMMUNITY)
Admission: EM | Admit: 2021-09-08 | Discharge: 2021-09-08 | Disposition: A | Discharge status: Home / Self Care | Payer: Medicare Other | Attending: Emergency Medicine | Admitting: Emergency Medicine

## 2021-09-08 ENCOUNTER — Encounter (HOSPITAL_COMMUNITY): Payer: Self-pay

## 2021-09-08 ENCOUNTER — Other Ambulatory Visit: Payer: Self-pay

## 2021-09-08 DIAGNOSIS — Z951 Presence of aortocoronary bypass graft: Secondary | ICD-10-CM | POA: Insufficient documentation

## 2021-09-08 DIAGNOSIS — Z992 Dependence on renal dialysis: Secondary | ICD-10-CM | POA: Insufficient documentation

## 2021-09-08 DIAGNOSIS — E1122 Type 2 diabetes mellitus with diabetic chronic kidney disease: Secondary | ICD-10-CM | POA: Diagnosis not present

## 2021-09-08 DIAGNOSIS — N186 End stage renal disease: Secondary | ICD-10-CM | POA: Diagnosis not present

## 2021-09-08 DIAGNOSIS — N611 Abscess of the breast and nipple: Secondary | ICD-10-CM | POA: Insufficient documentation

## 2021-09-08 DIAGNOSIS — I1311 Hypertensive heart and chronic kidney disease without heart failure, with stage 5 chronic kidney disease, or end stage renal disease: Secondary | ICD-10-CM | POA: Diagnosis not present

## 2021-09-08 DIAGNOSIS — I2510 Atherosclerotic heart disease of native coronary artery without angina pectoris: Secondary | ICD-10-CM | POA: Insufficient documentation

## 2021-09-08 DIAGNOSIS — L0291 Cutaneous abscess, unspecified: Secondary | ICD-10-CM

## 2021-09-08 LAB — CBG MONITORING, ED: Glucose-Capillary: 140 mg/dL — ABNORMAL HIGH (ref 70–99)

## 2021-09-08 MED ORDER — HYDROCODONE-ACETAMINOPHEN 5-325 MG PO TABS
1.0000 | ORAL_TABLET | ORAL | 0 refills | Status: AC | PRN
Start: 2021-09-08 — End: ?

## 2021-09-08 MED ORDER — LIDOCAINE-EPINEPHRINE (PF) 2 %-1:200000 IJ SOLN
10.0000 mL | Freq: Once | INTRAMUSCULAR | Status: AC
  Administered 2021-09-08: 10 mL
  Filled 2021-09-08: qty 20

## 2021-09-08 MED ORDER — DOXYCYCLINE HYCLATE 100 MG PO CAPS: 100.0000 mg | ORAL_CAPSULE | Freq: Two times a day (BID) | ORAL | 0 refills | Status: AC

## 2021-09-08 MED ORDER — DOXYCYCLINE HYCLATE 100 MG PO TABS
100.0000 mg | ORAL_TABLET | Freq: Once | ORAL | Status: AC
  Administered 2021-09-08: 100 mg via ORAL
  Filled 2021-09-08: qty 1

## 2021-09-08 MED ORDER — HYDROCODONE-ACETAMINOPHEN 5-325 MG PO TABS
1.0000 | ORAL_TABLET | Freq: Once | ORAL | Status: AC
  Administered 2021-09-08: 1 via ORAL
  Filled 2021-09-08: qty 1

## 2021-09-08 NOTE — ED Provider Notes (Signed)
[place] EMERGENCY DEPARTMENT Provider Note   CSN: [id number] Arrival date & time: 09/08/21  1113     History  Chief Complaint  Patient presents with   Abscess    Donald [name] is a 51 y.o. male.  Pt is a 51 yo male with a pmhx significant for ESRD on PD, CAD/ CABG, HTN, DM II, and HLD.  Pt said he's had an abscess above his right nipple for 3 days.  It is getting more swollen and painful.  No fevers.  Pt denies human bite.      Home Medications Prior to Admission medications   Medication Sig Start Date End Date Taking? Authorizing Provider  aspirin EC 81 MG EC tablet Take 1 tablet (81 mg total) by mouth daily. 07/17/19   Nita Sells, MD  calcitRIOL (ROCALTROL) 0.5 MCG capsule Take 2 mcg by mouth daily. Take 4 tablets (2 mcg) daily.    [provider]  calcium acetate (PHOSLO) 667 MG capsule Take 2,001 mg by mouth See admin instructions. Take 2,001 mg with each meal. 06/30/20   [provider]  carvedilol (COREG) 12.5 MG tablet Take 1 tablet (12.5 mg total) by mouth 2 (two) times daily with a meal. 09/09/20   Donato Heinz, MD  clopidogrel (PLAVIX) 75 MG tablet Take 1 tablet (75 mg total) by mouth daily. 02/25/21   Duke, Tami Lin, PA  Continuous Blood Gluc Sensor ([place]) MISC Use to check blood sugar at least 4 times daily. Change sensor Q 2 wks 06/04/20   Ladell Pier, MD  ezetimibe (ZETIA) 10 MG tablet Take 1 tablet (10 mg total) by mouth daily. 02/25/21 07/20/21  Ledora Bottcher, PA  gabapentin (NEURONTIN) 100 MG capsule 1 cap PO every other day 08/09/21   Ladell Pier, MD  gentamicin cream (GARAMYCIN) 0.1 % Apply 1 application topically every morning. Apply to exit site every day    [provider]  insulin lispro (HUMALOG KWIKPEN) 100 UNIT/ML KwikPen Inject 6 Units into the skin 3 (three) times daily. 03/28/21   Ladell Pier, MD  Insulin Pen Needle (TRUEPLUS PEN  NEEDLES) 31G X 6 MM MISC Use to inject Novolog TID and Lantus once daily. Total injections in 1 day = 4. 09/13/20   Ladell Pier, MD  Ipratropium-Albuterol (COMBIVENT) 20-100 MCG/ACT AERS respimat Inhale 1 puff into the lungs every 6 (six) hours as needed for wheezing. 08/09/21   Ladell Pier, MD  isosorbide dinitrate (ISORDIL) 30 MG tablet Take 1 tablet (30 mg total) by mouth 2 (two) times daily. Patient taking differently: Take 30 mg by mouth daily. 01/29/21 01/29/22  Kathlen Mody, Scott T, PA-C  LANTUS SOLOSTAR 100 UNIT/ML Solostar Pen ADMINISTER 20 UNITS UNDER THE SKIN DAILY Patient taking differently: Inject 20 Units into the skin at bedtime. 06/21/21   Ladell Pier, MD  mometasone-formoterol (DULERA) 100-5 MCG/ACT AERO Inhale 2 puffs into the lungs 2 (two) times daily. 08/09/21   Ladell Pier, MD  nitroGLYCERIN (NITROSTAT) 0.4 MG SL tablet Place 1 tablet (0.4 mg total) under the tongue every 5 (five) minutes as needed for chest pain. 01/29/21   Richardson Dopp T, PA-C  Peritoneal Dialysis Solutions (DIALYSIS SOLUTION 2.5% LOW-MG/LOW-CA) 394 MOSM/L SOLN dianeal solution As per nephro 07/16/19   Nita Sells, MD  polyethylene glycol powder (GLYCOLAX/MIRALAX) 17 GM/SCOOP powder Take 1 Container by mouth daily as needed for mild constipation.    [provider]  rosuvastatin (CRESTOR)  40 MG tablet Take 40 mg by mouth in the morning. 12/19/19   [provider]  senna (SENOKOT) 8.6 MG tablet Take 2 tablets (17.2 mg total) by mouth daily. 07/16/19   Nita Sells, MD      Allergies    Other and Lisinopril    Review of Systems   Review of Systems  Skin:        abscess  All other systems reviewed and are negative.  Physical Exam Updated Vital Signs BP (!) 174/96 (BP Location: Right Arm)   Pulse 78   Temp 97.7 F (36.5 C) (Oral)   Resp 17   Ht 5\' 10"  (1.778 m)   Wt 106.6 kg   SpO2 92%   BMI 33.72 kg/m  Physical Exam Vitals and nursing note  reviewed.  Constitutional:      Appearance: Normal appearance.  HENT:     Head: Normocephalic and atraumatic.     Right Ear: External ear normal.     Left Ear: External ear normal.     Nose: Nose normal.     Mouth/Throat:     Mouth: Mucous membranes are moist.     Pharynx: Oropharynx is clear.  Eyes:     Extraocular Movements: Extraocular movements intact.  Cardiovascular:     Rate and Rhythm: Normal rate and regular rhythm.     Pulses: Normal pulses.     Heart sounds: Normal heart sounds.  Pulmonary:     Effort: Pulmonary effort is normal.     Breath sounds: Normal breath sounds.  Abdominal:     General: Abdomen is flat. Bowel sounds are normal.     Palpations: Abdomen is soft.  Musculoskeletal:        General: Normal range of motion.     Cervical back: Normal range of motion and neck supple.  Skin:    Capillary Refill: Capillary refill takes less than 2 seconds.     Comments: Abscess above right nipple.  Red and swollen.  Neurological:     General: No focal deficit present.     Mental Status: He is alert and oriented to person, place, and time.  Psychiatric:        Mood and Affect: Mood normal.        Behavior: Behavior normal.    ED Results / Procedures / Treatments   Labs (all labs ordered are listed, but only abnormal results are displayed) Labs Reviewed  CBG MONITORING, ED    EKG None  Radiology No results found.  Procedures .Marland KitchenIncision and Drainage  Date/Time: 09/08/2021 3:17 PM Performed by: Isla Pence, MD Authorized by: Isla Pence, MD   Consent:    Consent obtained:  Verbal   Consent given by:  Patient   Risks discussed:  Bleeding and pain   Alternatives discussed:  No treatment Universal protocol:    Patient identity confirmed:  Verbally with patient Location:    Type:  Abscess   Location:  Trunk   Trunk location:  R breast Pre-procedure details:    Skin preparation:  Povidone-iodine Sedation:    Sedation type:  None Anesthesia:     Anesthesia method:  Local infiltration   Local anesthetic:  Lidocaine 2% WITH epi Procedure type:    Complexity:  Simple Procedure details:    Incision types:  Single straight   Wound management:  Probed and deloculated   Drainage:  Purulent   Drainage amount:  Moderate   Wound treatment:  Wound left open   Packing materials:  None Post-procedure details:    Procedure completion:  Tolerated well, no immediate complications    Medications Ordered in ED Medications  lidocaine-EPINEPHrine (XYLOCAINE W/EPI) 2 %-1:200000 (PF) injection 10 mL (has no administration in time range)  HYDROcodone-acetaminophen (NORCO/VICODIN) 5-325 MG per tablet 1 tablet (has no administration in time range)  doxycycline (VIBRA-TABS) tablet 100 mg (has no administration in time range)    ED Course/ Medical Decision Making/ A&P                           Medical Decision Making Risk Prescription drug management.   Pt's abscess drained.  Pt's bs is 140.  He is d/c with doxy.  He knows to return if worse.  F/u with pcp.        Final Clinical Impression(s) / ED Diagnoses Final diagnoses:  None    Rx / DC Orders ED Discharge Orders     None         Isla Pence, MD 09/08/21 [phone]

## 2021-09-08 NOTE — ED Triage Notes (Signed)
Pt arrived POV from home c/o an abscess above his right nipple. Pt states he noticed it 3 days ago but now it is extremely painful.

## 2021-09-08 NOTE — ED Notes (Signed)
Applied dressing per verbal orders. Pt stated pain is relieved. Educated pt about prescriptions and f/u instructions, pt stated understanding

## 2021-09-08 NOTE — ED Provider Triage Note (Signed)
Emergency Medicine Provider Triage Evaluation Note  Donald [name] , a 51 y.o. male  was evaluated in triage.  Pt complains of abscess above right nipple.  Patient reports that this developed 3 days ago.  He denies any drainage, fevers, body aches or chills, nausea, vomiting.  Review of Systems  Positive:  Negative:   Physical Exam  BP (!) 162/78 (BP Location: Right Arm)   Pulse 82   Temp 98.1 F (36.7 C) (Oral)   Resp 18   Ht 5\' 10"  (1.778 m)   Wt 106.6 kg   SpO2 94%   BMI 33.72 kg/m  Gen:   Awake, no distress   Resp:  Normal effort  MSK:   Moves extremities without difficulty  Other:  Indurated area above right nipple.  No drainage noted.  No fluctuant mass noted.  Medical Decision Making  Medically screening exam initiated at 12:17 PM.  Appropriate orders placed.  [name] was informed that the remainder of the evaluation will be completed by another provider, this initial triage assessment does not replace that evaluation, and the importance of remaining in the ED until their evaluation is complete.     Azucena Cecil, PA-C 09/08/21 1218

## 2021-09-09 ENCOUNTER — Ambulatory Visit: Payer: Medicare Other | Admitting: Pharmacist

## 2021-09-09 DIAGNOSIS — N186 End stage renal disease: Secondary | ICD-10-CM | POA: Diagnosis not present

## 2021-09-09 DIAGNOSIS — Z992 Dependence on renal dialysis: Secondary | ICD-10-CM | POA: Diagnosis not present

## 2021-09-10 DIAGNOSIS — Z992 Dependence on renal dialysis: Secondary | ICD-10-CM | POA: Diagnosis not present

## 2021-09-10 DIAGNOSIS — N186 End stage renal disease: Secondary | ICD-10-CM | POA: Diagnosis not present

## 2021-09-11 DIAGNOSIS — Z992 Dependence on renal dialysis: Secondary | ICD-10-CM | POA: Diagnosis not present

## 2021-09-11 DIAGNOSIS — N186 End stage renal disease: Secondary | ICD-10-CM | POA: Diagnosis not present

## 2021-09-12 DIAGNOSIS — Z992 Dependence on renal dialysis: Secondary | ICD-10-CM | POA: Diagnosis not present

## 2021-09-12 DIAGNOSIS — N186 End stage renal disease: Secondary | ICD-10-CM | POA: Diagnosis not present

## 2021-09-13 DIAGNOSIS — Z992 Dependence on renal dialysis: Secondary | ICD-10-CM | POA: Diagnosis not present

## 2021-09-13 DIAGNOSIS — N186 End stage renal disease: Secondary | ICD-10-CM | POA: Diagnosis not present

## 2021-09-14 DIAGNOSIS — Z5181 Encounter for therapeutic drug level monitoring: Secondary | ICD-10-CM | POA: Diagnosis not present

## 2021-09-14 DIAGNOSIS — Z79899 Other long term (current) drug therapy: Secondary | ICD-10-CM | POA: Diagnosis not present

## 2021-09-14 DIAGNOSIS — Z992 Dependence on renal dialysis: Secondary | ICD-10-CM | POA: Diagnosis not present

## 2021-09-14 DIAGNOSIS — R945 Abnormal results of liver function studies: Secondary | ICD-10-CM | POA: Diagnosis not present

## 2021-09-14 DIAGNOSIS — N186 End stage renal disease: Secondary | ICD-10-CM | POA: Diagnosis not present

## 2021-09-15 DIAGNOSIS — Z992 Dependence on renal dialysis: Secondary | ICD-10-CM | POA: Diagnosis not present

## 2021-09-15 DIAGNOSIS — N186 End stage renal disease: Secondary | ICD-10-CM | POA: Diagnosis not present

## 2021-09-16 DIAGNOSIS — N186 End stage renal disease: Secondary | ICD-10-CM | POA: Diagnosis not present

## 2021-09-16 DIAGNOSIS — Z992 Dependence on renal dialysis: Secondary | ICD-10-CM | POA: Diagnosis not present

## 2021-09-17 DIAGNOSIS — N186 End stage renal disease: Secondary | ICD-10-CM | POA: Diagnosis not present

## 2021-09-17 DIAGNOSIS — Z992 Dependence on renal dialysis: Secondary | ICD-10-CM | POA: Diagnosis not present

## 2021-09-18 DIAGNOSIS — Z992 Dependence on renal dialysis: Secondary | ICD-10-CM | POA: Diagnosis not present

## 2021-09-18 DIAGNOSIS — N186 End stage renal disease: Secondary | ICD-10-CM | POA: Diagnosis not present

## 2021-09-19 DIAGNOSIS — Z992 Dependence on renal dialysis: Secondary | ICD-10-CM | POA: Diagnosis not present

## 2021-09-19 DIAGNOSIS — N186 End stage renal disease: Secondary | ICD-10-CM | POA: Diagnosis not present

## 2021-09-20 DIAGNOSIS — Z992 Dependence on renal dialysis: Secondary | ICD-10-CM | POA: Diagnosis not present

## 2021-09-20 DIAGNOSIS — N186 End stage renal disease: Secondary | ICD-10-CM | POA: Diagnosis not present

## 2021-09-21 DIAGNOSIS — Z992 Dependence on renal dialysis: Secondary | ICD-10-CM | POA: Diagnosis not present

## 2021-09-21 DIAGNOSIS — N186 End stage renal disease: Secondary | ICD-10-CM | POA: Diagnosis not present

## 2021-09-22 DIAGNOSIS — N186 End stage renal disease: Secondary | ICD-10-CM | POA: Diagnosis not present

## 2021-09-22 DIAGNOSIS — Z992 Dependence on renal dialysis: Secondary | ICD-10-CM | POA: Diagnosis not present

## 2021-09-23 ENCOUNTER — Emergency Department (HOSPITAL_COMMUNITY): Payer: Medicare Other

## 2021-09-23 ENCOUNTER — Emergency Department (HOSPITAL_COMMUNITY)
Admission: EM | Admit: 2021-09-23 | Discharge: 2021-09-23 | Disposition: A | Discharge status: Home / Self Care | Payer: Medicare Other | Attending: Emergency Medicine | Admitting: Emergency Medicine

## 2021-09-23 ENCOUNTER — Encounter (HOSPITAL_COMMUNITY): Payer: Self-pay

## 2021-09-23 ENCOUNTER — Other Ambulatory Visit: Payer: Self-pay

## 2021-09-23 DIAGNOSIS — E1122 Type 2 diabetes mellitus with diabetic chronic kidney disease: Secondary | ICD-10-CM | POA: Diagnosis not present

## 2021-09-23 DIAGNOSIS — Z79899 Other long term (current) drug therapy: Secondary | ICD-10-CM | POA: Insufficient documentation

## 2021-09-23 DIAGNOSIS — Z794 Long term (current) use of insulin: Secondary | ICD-10-CM | POA: Diagnosis not present

## 2021-09-23 DIAGNOSIS — I2510 Atherosclerotic heart disease of native coronary artery without angina pectoris: Secondary | ICD-10-CM | POA: Diagnosis not present

## 2021-09-23 DIAGNOSIS — R932 Abnormal findings on diagnostic imaging of liver and biliary tract: Secondary | ICD-10-CM

## 2021-09-23 DIAGNOSIS — K4090 Unilateral inguinal hernia, without obstruction or gangrene, not specified as recurrent: Secondary | ICD-10-CM | POA: Diagnosis not present

## 2021-09-23 DIAGNOSIS — Z7982 Long term (current) use of aspirin: Secondary | ICD-10-CM | POA: Insufficient documentation

## 2021-09-23 DIAGNOSIS — R935 Abnormal findings on diagnostic imaging of other abdominal regions, including retroperitoneum: Secondary | ICD-10-CM | POA: Diagnosis not present

## 2021-09-23 DIAGNOSIS — I132 Hypertensive heart and chronic kidney disease with heart failure and with stage 5 chronic kidney disease, or end stage renal disease: Secondary | ICD-10-CM | POA: Diagnosis not present

## 2021-09-23 DIAGNOSIS — R1030 Lower abdominal pain, unspecified: Secondary | ICD-10-CM | POA: Diagnosis present

## 2021-09-23 DIAGNOSIS — N186 End stage renal disease: Secondary | ICD-10-CM | POA: Diagnosis not present

## 2021-09-23 DIAGNOSIS — R519 Headache, unspecified: Secondary | ICD-10-CM | POA: Insufficient documentation

## 2021-09-23 DIAGNOSIS — Z7902 Long term (current) use of antithrombotics/antiplatelets: Secondary | ICD-10-CM | POA: Insufficient documentation

## 2021-09-23 DIAGNOSIS — Z992 Dependence on renal dialysis: Secondary | ICD-10-CM | POA: Diagnosis not present

## 2021-09-23 DIAGNOSIS — N281 Cyst of kidney, acquired: Secondary | ICD-10-CM | POA: Diagnosis not present

## 2021-09-23 DIAGNOSIS — I509 Heart failure, unspecified: Secondary | ICD-10-CM | POA: Insufficient documentation

## 2021-09-23 DIAGNOSIS — K8020 Calculus of gallbladder without cholecystitis without obstruction: Secondary | ICD-10-CM | POA: Diagnosis not present

## 2021-09-23 DIAGNOSIS — R590 Localized enlarged lymph nodes: Secondary | ICD-10-CM | POA: Diagnosis not present

## 2021-09-23 DIAGNOSIS — E871 Hypo-osmolality and hyponatremia: Secondary | ICD-10-CM | POA: Diagnosis not present

## 2021-09-23 LAB — CBC
HCT: 35.2 % — ABNORMAL LOW (ref 39.0–52.0)
Hemoglobin: 11.9 g/dL — ABNORMAL LOW (ref 13.0–17.0)
MCH: 30.3 pg (ref 26.0–34.0)
MCHC: 33.8 g/dL (ref 30.0–36.0)
MCV: 89.6 fL (ref 80.0–100.0)
Platelets: 250 10*3/uL (ref 150–400)
RBC: 3.93 MIL/uL — ABNORMAL LOW (ref 4.22–5.81)
RDW: 18.1 % — ABNORMAL HIGH (ref 11.5–15.5)
WBC: 7 10*3/uL (ref 4.0–10.5)
nRBC: 0 % (ref 0.0–0.2)

## 2021-09-23 LAB — COMPREHENSIVE METABOLIC PANEL
ALT: 28 U/L (ref 0–44)
AST: 18 U/L (ref 15–41)
Albumin: 2.2 g/dL — ABNORMAL LOW (ref 3.5–5.0)
Alkaline Phosphatase: 102 U/L (ref 38–126)
Anion gap: 23 — ABNORMAL HIGH (ref 5–15)
BUN: 141 mg/dL — ABNORMAL HIGH (ref 6–20)
CO2: 17 mmol/L — ABNORMAL LOW (ref 22–32)
Calcium: 9.2 mg/dL (ref 8.9–10.3)
Chloride: 92 mmol/L — ABNORMAL LOW (ref 98–111)
Creatinine, Ser: 23.54 mg/dL — ABNORMAL HIGH (ref 0.61–1.24)
GFR, Estimated: 2 mL/min — ABNORMAL LOW (ref >60)
Glucose, Bld: 88 mg/dL (ref 70–99)
Potassium: 4.7 mmol/L (ref 3.5–5.1)
Sodium: 132 mmol/L — ABNORMAL LOW (ref 135–145)
Total Bilirubin: 0.8 mg/dL (ref 0.3–1.2)
Total Protein: 6.1 g/dL — ABNORMAL LOW (ref 6.5–8.1)

## 2021-09-23 MED ORDER — MORPHINE SULFATE (PF) 2 MG/ML IV SOLN
2.0000 mg | Freq: Once | INTRAVENOUS | Status: AC
  Administered 2021-09-23: 2 mg via INTRAMUSCULAR
  Filled 2021-09-23: qty 1

## 2021-09-23 MED ORDER — OXYCODONE-ACETAMINOPHEN 5-325 MG PO TABS
1.0000 | ORAL_TABLET | Freq: Once | ORAL | Status: AC
  Administered 2021-09-23: 1 via ORAL
  Filled 2021-09-23: qty 1

## 2021-09-23 MED ORDER — MORPHINE SULFATE (PF) 4 MG/ML IV SOLN
4.0000 mg | Freq: Once | INTRAVENOUS | Status: DC
  Filled 2021-09-23: qty 1

## 2021-09-23 MED ORDER — CEPHALEXIN 500 MG PO CAPS: 500.0000 mg | ORAL_CAPSULE | Freq: Four times a day (QID) | ORAL | 0 refills | Status: AC

## 2021-09-23 MED ORDER — CEPHALEXIN 250 MG PO CAPS
500.0000 mg | ORAL_CAPSULE | Freq: Once | ORAL | Status: AC
  Administered 2021-09-23: 500 mg via ORAL
  Filled 2021-09-23: qty 2

## 2021-09-23 NOTE — ED Provider Notes (Incomplete)
[facility] EMERGENCY DEPARTMENT Provider Note   CSN: [id number] Arrival date & time: 09/23/21  1229     History {Add pertinent medical, surgical, social history, OB history to HPI:1} Chief Complaint  Patient presents with  . Groin Pain    Donald [name] is a 51 y.o. male.   Groin Pain      Home Medications Prior to Admission medications   Medication Sig Start Date End Date Taking? Authorizing Provider  aspirin EC 81 MG EC tablet Take 1 tablet (81 mg total) by mouth daily. 07/17/19   Nita Sells, MD  calcitRIOL (ROCALTROL) 0.5 MCG capsule Take 2 mcg by mouth daily. Take 4 tablets (2 mcg) daily.    [provider]  calcium acetate (PHOSLO) 667 MG capsule Take 2,001 mg by mouth See admin instructions. Take 2,001 mg with each meal. 06/30/20   [provider]  carvedilol (COREG) 12.5 MG tablet Take 1 tablet (12.5 mg total) by mouth 2 (two) times daily with a meal. 09/09/20   Donato Heinz, MD  clopidogrel (PLAVIX) 75 MG tablet Take 1 tablet (75 mg total) by mouth daily. 02/25/21   Duke, Tami Lin, PA  Continuous Blood Gluc Sensor ([place]) MISC Use to check blood sugar at least 4 times daily. Change sensor Q 2 wks 06/04/20   Ladell Pier, MD  doxycycline (VIBRAMYCIN) 100 MG capsule Take 1 capsule (100 mg total) by mouth 2 (two) times daily. 09/08/21   Isla Pence, MD  ezetimibe (ZETIA) 10 MG tablet Take 1 tablet (10 mg total) by mouth daily. 02/25/21 07/20/21  Ledora Bottcher, PA  gabapentin (NEURONTIN) 100 MG capsule 1 cap PO every other day 08/09/21   Ladell Pier, MD  gentamicin cream (GARAMYCIN) 0.1 % Apply 1 application topically every morning. Apply to exit site every day    [provider]  HYDROcodone-acetaminophen (NORCO/VICODIN) 5-325 MG tablet Take 1 tablet by mouth every 4 (four) hours as needed. 09/08/21   Isla Pence, MD  insulin lispro (HUMALOG KWIKPEN) 100 UNIT/ML  KwikPen Inject 6 Units into the skin 3 (three) times daily. 03/28/21   Ladell Pier, MD  Insulin Pen Needle (TRUEPLUS PEN NEEDLES) 31G X 6 MM MISC Use to inject Novolog TID and Lantus once daily. Total injections in 1 day = 4. 09/13/20   Ladell Pier, MD  Ipratropium-Albuterol (COMBIVENT) 20-100 MCG/ACT AERS respimat Inhale 1 puff into the lungs every 6 (six) hours as needed for wheezing. 08/09/21   Ladell Pier, MD  isosorbide dinitrate (ISORDIL) 30 MG tablet Take 1 tablet (30 mg total) by mouth 2 (two) times daily. Patient taking differently: Take 30 mg by mouth daily. 01/29/21 01/29/22  Kathlen Mody, Scott T, PA-C  LANTUS SOLOSTAR 100 UNIT/ML Solostar Pen ADMINISTER 20 UNITS UNDER THE SKIN DAILY Patient taking differently: Inject 20 Units into the skin at bedtime. 06/21/21   Ladell Pier, MD  mometasone-formoterol (DULERA) 100-5 MCG/ACT AERO Inhale 2 puffs into the lungs 2 (two) times daily. 08/09/21   Ladell Pier, MD  nitroGLYCERIN (NITROSTAT) 0.4 MG SL tablet Place 1 tablet (0.4 mg total) under the tongue every 5 (five) minutes as needed for chest pain. 01/29/21   Richardson Dopp T, PA-C  Peritoneal Dialysis Solutions (DIALYSIS SOLUTION 2.5% LOW-MG/LOW-CA) 394 MOSM/L SOLN dianeal solution As per nephro 07/16/19   Nita Sells, MD  polyethylene glycol powder (GLYCOLAX/MIRALAX) 17 GM/SCOOP powder Take 1 Container by mouth daily as needed for mild constipation.  [provider]  rosuvastatin (CRESTOR) 40 MG tablet Take 40 mg by mouth in the morning. 12/19/19   [provider]  senna (SENOKOT) 8.6 MG tablet Take 2 tablets (17.2 mg total) by mouth daily. 07/16/19   Nita Sells, MD      Allergies    Other and Lisinopril    Review of Systems   Review of Systems  Physical Exam Updated Vital Signs BP 104/69   Pulse 77   Temp 98.5 F (36.9 C)   Resp 16   SpO2 98%  Physical Exam  ED Results / Procedures / Treatments   Labs (all labs ordered  are listed, but only abnormal results are displayed) Labs Reviewed  I-STAT CHEM 8, ED    EKG None  Radiology No results found.  Procedures Procedures  {Document cardiac monitor, telemetry assessment procedure when appropriate:1}  Medications Ordered in ED Medications - No data to display  ED Course/ Medical Decision Making/ A&P                           Medical Decision Making  ***  {Document critical care time when appropriate:1} {Document review of labs and clinical decision tools ie heart score, Chads2Vasc2 etc:1}  {Document your independent review of radiology images, and any outside records:1} {Document your discussion with family members, caretakers, and with consultants:1} {Document social determinants of health affecting pt's care:1} {Document your decision making why or why not admission, treatments were needed:1} Final Clinical Impression(s) / ED Diagnoses Final diagnoses:  None    Rx / DC Orders ED Discharge Orders     None

## 2021-09-23 NOTE — ED Triage Notes (Signed)
Was playing with grandchild and has a sudden sharp pain in right groin that hurts to sit or stand. Denies n/v no lump or swelling to right groin.

## 2021-09-23 NOTE — Discharge Instructions (Addendum)
The area that you are having pain is related to him some swelling of your lymph glands.  There were some incidental findings on your CT scan including some evidence of cirrhosis which is advanced liver disease.  Please refrain from any alcohol use.  I am treating you with a course of antibiotics called Keflex.  Please take for the entire 10-day course as prescribed he will have to take it 4 times daily.  Please make sure that you complete your peritoneal dialysis today.  Please follow-up with your primary care provider.

## 2021-09-23 NOTE — ED Provider Notes (Signed)
[facility] EMERGENCY DEPARTMENT Provider Note   CSN: [id number] Arrival date & time: 09/23/21  1229     History  Chief Complaint  Patient presents with   Groin Pain    Donald [name] is a 51 y.o. male.   Groin Pain  Patient is a 51 year old male with past medical history significant for CKD on dialysis. Makes urine.  Also has numerous other medical issues including CHF, HLD, CAD, DM 2, HTN  Patient is presented emergency room today because of a sudden onset of sharp pain in right groin when he was playing with his grandchild 2 days ago on Wednesday 6/14.  He states that the pain was sudden onset and he felt like there was a small popping sensation.  He has had nonbloody bowel movements since this occurred.  No nausea or vomiting and no abdominal pain so much as right groin pain.  No urinary issues and patient no longer makes urine.  Is still passing flatus  No chest pain or other associate symptoms.  No fevers or chills.      Home Medications Prior to Admission medications   Medication Sig Start Date End Date Taking? Authorizing Provider  cephALEXin (KEFLEX) 500 MG capsule Take 1 capsule (500 mg total) by mouth 4 (four) times daily for 10 days. 09/23/21 10/03/21 Yes Donald Sias, PA  aspirin EC 81 MG EC tablet Take 1 tablet (81 mg total) by mouth daily. 07/17/19   Nita Sells, MD  calcitRIOL (ROCALTROL) 0.5 MCG capsule Take 2 mcg by mouth daily. Take 4 tablets (2 mcg) daily.    [provider]  calcium acetate (PHOSLO) 667 MG capsule Take 2,001 mg by mouth See admin instructions. Take 2,001 mg with each meal. 06/30/20   [provider]  carvedilol (COREG) 12.5 MG tablet Take 1 tablet (12.5 mg total) by mouth 2 (two) times daily with a meal. 09/09/20   Donato Heinz, MD  clopidogrel (PLAVIX) 75 MG tablet Take 1 tablet (75 mg total) by mouth daily. 02/25/21   [facility], Tami Lin, PA  Continuous Blood Gluc Sensor ([place]) MISC Use to check blood sugar at least 4 times daily. Change sensor Q 2 wks 06/04/20   Ladell Pier, MD  doxycycline (VIBRAMYCIN) 100 MG capsule Take 1 capsule (100 mg total) by mouth 2 (two) times daily. 09/08/21   Isla Pence, MD  ezetimibe (ZETIA) 10 MG tablet Take 1 tablet (10 mg total) by mouth daily. 02/25/21 07/20/21  Ledora Bottcher, PA  gabapentin (NEURONTIN) 100 MG capsule 1 cap PO every other day 08/09/21   Ladell Pier, MD  gentamicin cream (GARAMYCIN) 0.1 % Apply 1 application topically every morning. Apply to exit site every day    [provider]  HYDROcodone-acetaminophen (NORCO/VICODIN) 5-325 MG tablet Take 1 tablet by mouth every 4 (four) hours as needed. 09/08/21   Isla Pence, MD  insulin lispro (HUMALOG KWIKPEN) 100 UNIT/ML KwikPen Inject 6 Units into the skin 3 (three) times daily. 03/28/21   Ladell Pier, MD  Insulin Pen Needle (TRUEPLUS PEN NEEDLES) 31G X 6 MM MISC Use to inject Novolog TID and Lantus once daily. Total injections in 1 day = 4. 09/13/20   Ladell Pier, MD  Ipratropium-Albuterol (COMBIVENT) 20-100 MCG/ACT AERS respimat Inhale 1 puff into the lungs every 6 (six) hours as needed for wheezing. 08/09/21   Ladell Pier, MD  isosorbide dinitrate (ISORDIL) 30 MG tablet Take 1 tablet (  30 mg total) by mouth 2 (two) times daily. Patient taking differently: Take 30 mg by mouth daily. 01/29/21 01/29/22  Kathlen Mody, Scott T, PA-C  LANTUS SOLOSTAR 100 UNIT/ML Solostar Pen ADMINISTER 20 UNITS UNDER THE SKIN DAILY Patient taking differently: Inject 20 Units into the skin at bedtime. 06/21/21   Ladell Pier, MD  mometasone-formoterol (DULERA) 100-5 MCG/ACT AERO Inhale 2 puffs into the lungs 2 (two) times daily. 08/09/21   Ladell Pier, MD  nitroGLYCERIN (NITROSTAT) 0.4 MG SL tablet Place 1 tablet (0.4 mg total) under the tongue every 5 (five) minutes as needed for chest pain. 01/29/21   Richardson Dopp T, PA-C   Peritoneal Dialysis Solutions (DIALYSIS SOLUTION 2.5% LOW-MG/LOW-CA) 394 MOSM/L SOLN dianeal solution As per nephro 07/16/19   Nita Sells, MD  polyethylene glycol powder (GLYCOLAX/MIRALAX) 17 GM/SCOOP powder Take 1 Container by mouth daily as needed for mild constipation.    [provider]  rosuvastatin (CRESTOR) 40 MG tablet Take 40 mg by mouth in the morning. 12/19/19   [provider]  senna (SENOKOT) 8.6 MG tablet Take 2 tablets (17.2 mg total) by mouth daily. 07/16/19   Nita Sells, MD      Allergies    Other and Lisinopril    Review of Systems   Review of Systems  Physical Exam Updated Vital Signs BP 118/74   Pulse 70   Temp (!) 97.5 F (36.4 C) (Oral)   Resp 18   SpO2 96%  Physical Exam Vitals and nursing note reviewed.  Constitutional:      General: He is not in acute distress.    Appearance: Normal appearance. He is not ill-appearing.     Comments: Chronically ill-appearing 51 year old male in no acute distress.  HENT:     Head: Normocephalic and atraumatic.     Nose: Nose normal.  Eyes:     General: No scleral icterus.       Right eye: No discharge.        Left eye: No discharge.     Conjunctiva/sclera: Conjunctivae normal.  Cardiovascular:     Rate and Rhythm: Normal rate and regular rhythm.     Pulses: Normal pulses.     Heart sounds: Normal heart sounds.     Comments: Bilateral DP pulse PT pulses 3+ and symmetric. Pulmonary:     Effort: Pulmonary effort is normal. No respiratory distress.     Breath sounds: No stridor. No wheezing.  Abdominal:     Palpations: Abdomen is soft.     Tenderness: There is no abdominal tenderness.     Comments: PD catheter and right abdomen  Musculoskeletal:     Cervical back: Normal range of motion.     Right lower leg: No edema.     Left lower leg: No edema.     Comments: Right groin with significant tenderness palpation.  Skin:    General: Skin is warm and dry.     Capillary Refill:  Capillary refill takes less than 2 seconds.  Neurological:     Mental Status: He is alert and oriented to person, place, and time. Mental status is at baseline.  Psychiatric:        Mood and Affect: Mood normal.        Behavior: Behavior normal.     ED Results / Procedures / Treatments   Labs (all labs ordered are listed, but only abnormal results are displayed) Labs Reviewed  CBC - Abnormal; Notable for the following components:  Result Value   RBC 3.93 (*)    Hemoglobin 11.9 (*)    HCT 35.2 (*)    RDW 18.1 (*)    All other components within normal limits  COMPREHENSIVE METABOLIC PANEL - Abnormal; Notable for the following components:   Sodium 132 (*)    Chloride 92 (*)    CO2 17 (*)    BUN 141 (*)    Creatinine, Ser 23.54 (*)    Total Protein 6.1 (*)    Albumin 2.2 (*)    GFR, Estimated 2 (*)    Anion gap 23 (*)    All other components within normal limits  I-STAT CHEM 8, ED    EKG None  Radiology CT ABDOMEN PELVIS WO CONTRAST  Result Date: 09/23/2021 CLINICAL DATA:  Abdominal pain, acute, nonlocalized possible hernia R femoral region/R groin EXAM: CT ABDOMEN AND PELVIS WITHOUT CONTRAST TECHNIQUE: Multidetector CT imaging of the abdomen and pelvis was performed following the standard protocol without IV contrast. RADIATION DOSE REDUCTION: This exam was performed according to the departmental dose-optimization program which includes automated exposure control, adjustment of the mA and/or kV according to patient size and/or use of iterative reconstruction technique. COMPARISON:  None Available. FINDINGS: Lower chest: Cardiomegaly. There are coronary artery and mitral annulus calcifications. Dependent atelectasis in the lower lobes. No significant pleural effusion. Hepatobiliary: No focal hepatic abnormality on this unenhanced exam. There may be a subtle capsular nodularity. Layering stones in the gallbladder which is not abnormally distended. No biliary dilatation.  Pancreas: Parenchymal atrophy. No ductal dilatation or inflammation. Spleen: Normal in size without focal abnormality. Adrenals/Urinary Tract: No adrenal nodule. Atrophic kidneys without hydronephrosis. Vascular calcifications versus nonobstructing stones at the renal hila. There are small bilateral renal cysts needing no further follow-up. Urinary bladder is decompressed. Stomach/Bowel: Fluid in the distal esophagus. The stomach is nondistended. No small bowel obstruction. No obvious small bowel inflammation, although assessment is limited due to presence of ascites. The appendix is normal. Small volume of colonic stool without acute colonic inflammation. Vascular/Lymphatic: Moderate aortic atherosclerosis. No aortic aneurysm. No portal venous gas. There are scattered small retroperitoneal lymph nodes are not enlarged by size criteria. There are enlarged right inguinal node nodes, largest measuring 2.1 cm short axis with perinodal edema. Additional prominent bilateral inguinal nodes measuring up to 15 mm on the left. Reproductive: Prostate gland is not well seen. Other: Moderate volume abdominopelvic ascites, measuring simple fluid density. Peritoneal dialysis catheter enters the right abdomen and courses into the pelvis, looped in the left anterior pelvis. There is marked body wall edema as well as moderate areas of skin thickening. Generalized mesenteric edema. No free intra-abdominal air. Minimal fat in the right inguinal canal. 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 There is scattered Schmorl's nodes in the spine. Diffuse ground-glass density of the osseous structures consistent with renal osteodystrophy. No acute osseous findings. IMPRESSION: 1. Right greater than left inguinal adenopathy with perinodal edema. The presence of perinodal edema suggest lymphadenitis. The possibility of metastatic adenopathy or lymphoproliferative disorder is not entirely excluded.  Recommend clinical correlation for lymphoproliferative disorder. 2. Moderate volume abdominopelvic ascites. Mesenteric and marked body wall edema and skin thickening, suggesting third-spacing. 3. Possible nodular Paddock contours may represent cirrhosis, recommend correlation with cirrhosis risk factors. 4. Cholelithiasis without CT findings of acute cholecystitis. 5. Sequela of chronic renal disease with atrophic kidneys head peritoneal dialysis catheter in place. 6. 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 7. Cardiomegaly. Coronary artery  calcifications. Aortic Atherosclerosis (ICD10-I70.0). Electronically Signed   By: Keith Rake M.D.   On: 09/23/2021 20:30    Procedures Procedures    Medications Ordered in ED Medications  oxyCODONE-acetaminophen (PERCOCET/ROXICET) 5-325 MG per tablet 1 tablet (1 tablet Oral Given 09/23/21 1803)  cephALEXin (KEFLEX) capsule 500 mg (500 mg Oral Given 09/23/21 2114)  morphine (PF) 2 MG/ML injection 2 mg (2 mg Intramuscular Given 09/23/21 2117)    ED Course/ Medical Decision Making/ A&P                           Medical Decision Making Amount and/or Complexity of Data Reviewed Labs: ordered. Radiology: ordered.  Risk Prescription drug management.   Patient is a 51 year old male with past medical history significant for CKD on dialysis. Makes urine.  Also has numerous other medical issues including CHF, HLD, CAD, DM 2, HTN  Patient is presented emergency room today because of a sudden onset of sharp pain in right groin when he was playing with his grandchild 2 days ago on Wednesday 6/14.  He states that the pain was sudden onset and he felt like there was a small popping sensation.  He has had nonbloody bowel movements since this occurred.  No nausea or vomiting and no abdominal pain so much as right groin pain.  No urinary issues and patient no longer makes urine.  Is still passing flatus  No chest pain or other associate  symptoms.  No fevers or chills.  CMP with mild hyponatremia significantly elevated BUN in the setting of patient being a peritoneal dialysis patient who sleep dialysis yesterday.  He states he will go home and dialysis today.  Anion gap elevated likely secondary to uremia.  CBC without leukocytosis and patient is afebrile and without tachycardia hypotension  CT abdomen pelvis without contrast was obtained which showed some lymphadenopathy in the area of his discomfort.  Ascites are likely result of the fact that he is a peritoneal dialysis patient.  Otherwise CT scan shows evidence of chronic disease for the patient.  No acute findings apart from with adenopathy.  IMPRESSION:  1. Right greater than left inguinal adenopathy with perinodal edema.  The presence of perinodal edema suggest lymphadenitis. The  possibility of metastatic adenopathy or lymphoproliferative disorder  is not entirely excluded. Recommend clinical correlation for  lymphoproliferative disorder.  2. Moderate volume abdominopelvic ascites. Mesenteric and marked  body wall edema and skin thickening, suggesting third-spacing.  3. Possible nodular Paddock contours may represent cirrhosis,  recommend correlation with cirrhosis risk factors.  4. Cholelithiasis without CT findings of acute cholecystitis.  5. Sequela of chronic renal disease with atrophic kidneys head  peritoneal dialysis catheter in place.  6. 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  7. Cardiomegaly. Coronary artery calcifications.    Aortic Atherosclerosis (ICD10-I70.0).      Electronically Signed    By: Keith Rake M.D.    On: 09/23/2021 20:30    I reexamined his lower extremities I do not see any lacerations or wounds or ulcers.  I discussed this case with my attending physician who cosigned this note including patient's presenting symptoms, physical exam, and planned diagnostics and interventions. Attending physician stated  agreement with plan or made changes to plan which were implemented.   We will treat patient with Keflex with for empiric treatment of lymphadenitis and recommend close follow-up with PCP.  Does have evidence of cirrhosis on CT imaging.  I  updated him about this he will follow-up with PCP to discuss these findings as well as reevaluate him for his current symptoms.  Return precautions were discussed with patient.  He is tolerating p.o. and ambulating prior to discharge.  Final Clinical Impression(s) / ED Diagnoses Final diagnoses:  Inguinal lymphadenopathy  Abnormal abdominal CT scan  Abnormal liver CT    Rx / DC Orders ED Discharge Orders          Ordered    cephALEXin (KEFLEX) 500 MG capsule  4 times daily        09/23/21 2103              Donald [name], [place] 09/23/21 2146    Dorie Rank, MD 09/24/21 1515

## 2021-09-23 NOTE — ED Provider Triage Note (Signed)
Emergency Medicine Provider Triage Evaluation Note  Donald [name] , a 51 y.o. male  was evaluated in triage.  Pt complains of right groin pain onset yesterday.  He notes he was playing with his grandchild he felt a sharp sudden pain to his right groin that hurts when he stands or stand.  No meds tried prior to arrival.  No nausea, vomiting, abdominal pain.  Review of Systems  Positive: As per HPI above Negative:   Physical Exam  BP 109/70 (BP Location: Right Arm)   Pulse 76   Temp 98.5 F (36.9 C)   Resp 16   SpO2 97%  Gen:   Awake, no distress   Resp:  Normal effort  MSK:   Moves extremities without difficulty  Other:  Patient ambulatory in the room.  Tenderness to palpation noted to right lumbar spine.  GU exam deferred in triage.  No tenderness to palpation noted to abdomen.  Peritoneal dialysis site in place without overlying skin changes.  Medical Decision Making  Medically screening exam initiated at 1:18 PM.  Appropriate orders placed.  [name] was informed that the remainder of the evaluation will be completed by another provider, this initial triage assessment does not replace that evaluation, and the importance of remaining in the ED until their evaluation is complete.  Work-up initiated   Saathvik Every A, PA-C 09/23/21 1323

## 2021-09-24 DIAGNOSIS — N186 End stage renal disease: Secondary | ICD-10-CM | POA: Diagnosis not present

## 2021-09-24 DIAGNOSIS — Z992 Dependence on renal dialysis: Secondary | ICD-10-CM | POA: Diagnosis not present

## 2021-09-25 DIAGNOSIS — Z992 Dependence on renal dialysis: Secondary | ICD-10-CM | POA: Diagnosis not present

## 2021-09-25 DIAGNOSIS — N186 End stage renal disease: Secondary | ICD-10-CM | POA: Diagnosis not present

## 2021-09-26 DIAGNOSIS — N186 End stage renal disease: Secondary | ICD-10-CM | POA: Diagnosis not present

## 2021-09-26 DIAGNOSIS — Z992 Dependence on renal dialysis: Secondary | ICD-10-CM | POA: Diagnosis not present

## 2021-09-27 DIAGNOSIS — Z992 Dependence on renal dialysis: Secondary | ICD-10-CM | POA: Diagnosis not present

## 2021-09-27 DIAGNOSIS — N186 End stage renal disease: Secondary | ICD-10-CM | POA: Diagnosis not present

## 2021-09-28 DIAGNOSIS — N186 End stage renal disease: Secondary | ICD-10-CM | POA: Diagnosis not present

## 2021-09-28 DIAGNOSIS — Z992 Dependence on renal dialysis: Secondary | ICD-10-CM | POA: Diagnosis not present

## 2021-09-29 DIAGNOSIS — Z992 Dependence on renal dialysis: Secondary | ICD-10-CM | POA: Diagnosis not present

## 2021-09-29 DIAGNOSIS — N186 End stage renal disease: Secondary | ICD-10-CM | POA: Diagnosis not present

## 2021-09-30 DIAGNOSIS — N186 End stage renal disease: Secondary | ICD-10-CM | POA: Diagnosis not present

## 2021-09-30 DIAGNOSIS — Z992 Dependence on renal dialysis: Secondary | ICD-10-CM | POA: Diagnosis not present

## 2021-10-01 DIAGNOSIS — Z992 Dependence on renal dialysis: Secondary | ICD-10-CM | POA: Diagnosis not present

## 2021-10-01 DIAGNOSIS — N186 End stage renal disease: Secondary | ICD-10-CM | POA: Diagnosis not present

## 2021-10-02 DIAGNOSIS — N186 End stage renal disease: Secondary | ICD-10-CM | POA: Diagnosis not present

## 2021-10-02 DIAGNOSIS — Z992 Dependence on renal dialysis: Secondary | ICD-10-CM | POA: Diagnosis not present

## 2021-10-03 DIAGNOSIS — N186 End stage renal disease: Secondary | ICD-10-CM | POA: Diagnosis not present

## 2021-10-03 DIAGNOSIS — Z992 Dependence on renal dialysis: Secondary | ICD-10-CM | POA: Diagnosis not present

## 2021-10-04 DIAGNOSIS — Z992 Dependence on renal dialysis: Secondary | ICD-10-CM | POA: Diagnosis not present

## 2021-10-04 DIAGNOSIS — N186 End stage renal disease: Secondary | ICD-10-CM | POA: Diagnosis not present

## 2021-10-05 DIAGNOSIS — N186 End stage renal disease: Secondary | ICD-10-CM | POA: Diagnosis not present

## 2021-10-05 DIAGNOSIS — Z992 Dependence on renal dialysis: Secondary | ICD-10-CM | POA: Diagnosis not present

## 2021-10-06 DIAGNOSIS — R9439 Abnormal result of other cardiovascular function study: Secondary | ICD-10-CM | POA: Diagnosis not present

## 2021-10-06 DIAGNOSIS — I120 Hypertensive chronic kidney disease with stage 5 chronic kidney disease or end stage renal disease: Secondary | ICD-10-CM | POA: Diagnosis not present

## 2021-10-06 DIAGNOSIS — Z79899 Other long term (current) drug therapy: Secondary | ICD-10-CM | POA: Diagnosis not present

## 2021-10-06 DIAGNOSIS — N186 End stage renal disease: Secondary | ICD-10-CM | POA: Diagnosis not present

## 2021-10-06 DIAGNOSIS — Z01810 Encounter for preprocedural cardiovascular examination: Secondary | ICD-10-CM | POA: Diagnosis not present

## 2021-10-06 DIAGNOSIS — E1122 Type 2 diabetes mellitus with diabetic chronic kidney disease: Secondary | ICD-10-CM | POA: Diagnosis not present

## 2021-10-06 DIAGNOSIS — Z992 Dependence on renal dialysis: Secondary | ICD-10-CM | POA: Diagnosis not present

## 2021-10-07 DIAGNOSIS — Z992 Dependence on renal dialysis: Secondary | ICD-10-CM | POA: Diagnosis not present

## 2021-10-07 DIAGNOSIS — Z79899 Other long term (current) drug therapy: Secondary | ICD-10-CM | POA: Diagnosis not present

## 2021-10-07 DIAGNOSIS — R9439 Abnormal result of other cardiovascular function study: Secondary | ICD-10-CM | POA: Diagnosis not present

## 2021-10-07 DIAGNOSIS — E1122 Type 2 diabetes mellitus with diabetic chronic kidney disease: Secondary | ICD-10-CM | POA: Diagnosis not present

## 2021-10-07 DIAGNOSIS — Z01810 Encounter for preprocedural cardiovascular examination: Secondary | ICD-10-CM | POA: Diagnosis not present

## 2021-10-07 DIAGNOSIS — N186 End stage renal disease: Secondary | ICD-10-CM | POA: Diagnosis not present

## 2021-10-07 DIAGNOSIS — I120 Hypertensive chronic kidney disease with stage 5 chronic kidney disease or end stage renal disease: Secondary | ICD-10-CM | POA: Diagnosis not present

## 2021-10-08 DIAGNOSIS — N186 End stage renal disease: Secondary | ICD-10-CM | POA: Diagnosis not present

## 2021-10-08 DIAGNOSIS — Z992 Dependence on renal dialysis: Secondary | ICD-10-CM | POA: Diagnosis not present

## 2021-10-09 DIAGNOSIS — Z992 Dependence on renal dialysis: Secondary | ICD-10-CM | POA: Diagnosis not present

## 2021-10-09 DIAGNOSIS — N186 End stage renal disease: Secondary | ICD-10-CM | POA: Diagnosis not present

## 2021-10-10 DIAGNOSIS — Z992 Dependence on renal dialysis: Secondary | ICD-10-CM | POA: Diagnosis not present

## 2021-10-10 DIAGNOSIS — N186 End stage renal disease: Secondary | ICD-10-CM | POA: Diagnosis not present

## 2021-10-11 DIAGNOSIS — Z992 Dependence on renal dialysis: Secondary | ICD-10-CM | POA: Diagnosis not present

## 2021-10-11 DIAGNOSIS — N186 End stage renal disease: Secondary | ICD-10-CM | POA: Diagnosis not present

## 2021-10-12 DIAGNOSIS — Z992 Dependence on renal dialysis: Secondary | ICD-10-CM | POA: Diagnosis not present

## 2021-10-12 DIAGNOSIS — N186 End stage renal disease: Secondary | ICD-10-CM | POA: Diagnosis not present

## 2021-10-13 DIAGNOSIS — I10 Essential (primary) hypertension: Secondary | ICD-10-CM | POA: Diagnosis not present

## 2021-10-13 DIAGNOSIS — Z992 Dependence on renal dialysis: Secondary | ICD-10-CM | POA: Diagnosis not present

## 2021-10-13 DIAGNOSIS — R55 Syncope and collapse: Secondary | ICD-10-CM | POA: Diagnosis not present

## 2021-10-13 DIAGNOSIS — N186 End stage renal disease: Secondary | ICD-10-CM | POA: Diagnosis not present

## 2021-10-14 DIAGNOSIS — Z992 Dependence on renal dialysis: Secondary | ICD-10-CM | POA: Diagnosis not present

## 2021-10-14 DIAGNOSIS — N186 End stage renal disease: Secondary | ICD-10-CM | POA: Diagnosis not present

## 2021-10-15 DIAGNOSIS — N186 End stage renal disease: Secondary | ICD-10-CM | POA: Diagnosis not present

## 2021-10-15 DIAGNOSIS — Z992 Dependence on renal dialysis: Secondary | ICD-10-CM | POA: Diagnosis not present

## 2021-10-16 DIAGNOSIS — N186 End stage renal disease: Secondary | ICD-10-CM | POA: Diagnosis not present

## 2021-10-16 DIAGNOSIS — Z992 Dependence on renal dialysis: Secondary | ICD-10-CM | POA: Diagnosis not present

## 2021-10-17 DIAGNOSIS — N186 End stage renal disease: Secondary | ICD-10-CM | POA: Diagnosis not present

## 2021-10-17 DIAGNOSIS — Z992 Dependence on renal dialysis: Secondary | ICD-10-CM | POA: Diagnosis not present

## 2021-10-18 DIAGNOSIS — N186 End stage renal disease: Secondary | ICD-10-CM | POA: Diagnosis not present

## 2021-10-18 DIAGNOSIS — Z992 Dependence on renal dialysis: Secondary | ICD-10-CM | POA: Diagnosis not present

## 2021-10-19 DIAGNOSIS — N186 End stage renal disease: Secondary | ICD-10-CM | POA: Diagnosis not present

## 2021-10-19 DIAGNOSIS — Z992 Dependence on renal dialysis: Secondary | ICD-10-CM | POA: Diagnosis not present

## 2021-10-20 DIAGNOSIS — N186 End stage renal disease: Secondary | ICD-10-CM | POA: Diagnosis not present

## 2021-10-20 DIAGNOSIS — Z992 Dependence on renal dialysis: Secondary | ICD-10-CM | POA: Diagnosis not present

## 2021-10-21 DIAGNOSIS — N186 End stage renal disease: Secondary | ICD-10-CM | POA: Diagnosis not present

## 2021-10-21 DIAGNOSIS — Z992 Dependence on renal dialysis: Secondary | ICD-10-CM | POA: Diagnosis not present

## 2021-10-22 DIAGNOSIS — Z992 Dependence on renal dialysis: Secondary | ICD-10-CM | POA: Diagnosis not present

## 2021-10-22 DIAGNOSIS — N186 End stage renal disease: Secondary | ICD-10-CM | POA: Diagnosis not present

## 2021-10-23 DIAGNOSIS — Z992 Dependence on renal dialysis: Secondary | ICD-10-CM | POA: Diagnosis not present

## 2021-10-23 DIAGNOSIS — N186 End stage renal disease: Secondary | ICD-10-CM | POA: Diagnosis not present

## 2021-10-24 DIAGNOSIS — Z992 Dependence on renal dialysis: Secondary | ICD-10-CM | POA: Diagnosis not present

## 2021-10-24 DIAGNOSIS — N186 End stage renal disease: Secondary | ICD-10-CM | POA: Diagnosis not present

## 2021-10-25 DIAGNOSIS — Z992 Dependence on renal dialysis: Secondary | ICD-10-CM | POA: Diagnosis not present

## 2021-10-25 DIAGNOSIS — N186 End stage renal disease: Secondary | ICD-10-CM | POA: Diagnosis not present

## 2021-10-26 DIAGNOSIS — Z992 Dependence on renal dialysis: Secondary | ICD-10-CM | POA: Diagnosis not present

## 2021-10-26 DIAGNOSIS — N186 End stage renal disease: Secondary | ICD-10-CM | POA: Diagnosis not present

## 2021-10-26 DIAGNOSIS — E119 Type 2 diabetes mellitus without complications: Secondary | ICD-10-CM | POA: Diagnosis not present

## 2021-10-26 DIAGNOSIS — Z794 Long term (current) use of insulin: Secondary | ICD-10-CM | POA: Diagnosis not present

## 2021-10-27 DIAGNOSIS — N186 End stage renal disease: Secondary | ICD-10-CM | POA: Diagnosis not present

## 2021-10-27 DIAGNOSIS — Z992 Dependence on renal dialysis: Secondary | ICD-10-CM | POA: Diagnosis not present

## 2021-10-28 DIAGNOSIS — Z992 Dependence on renal dialysis: Secondary | ICD-10-CM | POA: Diagnosis not present

## 2021-10-28 DIAGNOSIS — N186 End stage renal disease: Secondary | ICD-10-CM | POA: Diagnosis not present

## 2021-10-29 DIAGNOSIS — Z992 Dependence on renal dialysis: Secondary | ICD-10-CM | POA: Diagnosis not present

## 2021-10-29 DIAGNOSIS — N186 End stage renal disease: Secondary | ICD-10-CM | POA: Diagnosis not present

## 2021-10-30 DIAGNOSIS — N186 End stage renal disease: Secondary | ICD-10-CM | POA: Diagnosis not present

## 2021-10-30 DIAGNOSIS — Z992 Dependence on renal dialysis: Secondary | ICD-10-CM | POA: Diagnosis not present

## 2021-10-31 DIAGNOSIS — Z992 Dependence on renal dialysis: Secondary | ICD-10-CM | POA: Diagnosis not present

## 2021-10-31 DIAGNOSIS — N186 End stage renal disease: Secondary | ICD-10-CM | POA: Diagnosis not present

## 2021-11-01 ENCOUNTER — Telehealth: Payer: Self-pay | Admitting: Internal Medicine

## 2021-11-01 ENCOUNTER — Observation Stay (HOSPITAL_COMMUNITY)
Admission: EM | Admit: 2021-11-01 | Discharge: 2021-11-08 | Disposition: E | Payer: Medicare Other | Attending: Emergency Medicine | Admitting: Emergency Medicine

## 2021-11-01 ENCOUNTER — Encounter (HOSPITAL_COMMUNITY): Payer: Self-pay | Admitting: Emergency Medicine

## 2021-11-01 ENCOUNTER — Emergency Department (HOSPITAL_COMMUNITY): Payer: Medicare Other

## 2021-11-01 ENCOUNTER — Other Ambulatory Visit: Payer: Self-pay

## 2021-11-01 DIAGNOSIS — E1142 Type 2 diabetes mellitus with diabetic polyneuropathy: Secondary | ICD-10-CM | POA: Diagnosis present

## 2021-11-01 DIAGNOSIS — Z7982 Long term (current) use of aspirin: Secondary | ICD-10-CM | POA: Insufficient documentation

## 2021-11-01 DIAGNOSIS — E1122 Type 2 diabetes mellitus with diabetic chronic kidney disease: Secondary | ICD-10-CM | POA: Insufficient documentation

## 2021-11-01 DIAGNOSIS — Z992 Dependence on renal dialysis: Secondary | ICD-10-CM | POA: Diagnosis not present

## 2021-11-01 DIAGNOSIS — D649 Anemia, unspecified: Secondary | ICD-10-CM

## 2021-11-01 DIAGNOSIS — Z794 Long term (current) use of insulin: Secondary | ICD-10-CM | POA: Diagnosis not present

## 2021-11-01 DIAGNOSIS — D62 Acute posthemorrhagic anemia: Secondary | ICD-10-CM | POA: Insufficient documentation

## 2021-11-01 DIAGNOSIS — R079 Chest pain, unspecified: Secondary | ICD-10-CM | POA: Diagnosis not present

## 2021-11-01 DIAGNOSIS — I2510 Atherosclerotic heart disease of native coronary artery without angina pectoris: Secondary | ICD-10-CM | POA: Insufficient documentation

## 2021-11-01 DIAGNOSIS — Z7902 Long term (current) use of antithrombotics/antiplatelets: Secondary | ICD-10-CM | POA: Diagnosis not present

## 2021-11-01 DIAGNOSIS — I214 Non-ST elevation (NSTEMI) myocardial infarction: Principal | ICD-10-CM | POA: Insufficient documentation

## 2021-11-01 DIAGNOSIS — I132 Hypertensive heart and chronic kidney disease with heart failure and with stage 5 chronic kidney disease, or end stage renal disease: Secondary | ICD-10-CM | POA: Diagnosis not present

## 2021-11-01 DIAGNOSIS — R42 Dizziness and giddiness: Secondary | ICD-10-CM | POA: Diagnosis not present

## 2021-11-01 DIAGNOSIS — N186 End stage renal disease: Secondary | ICD-10-CM | POA: Insufficient documentation

## 2021-11-01 DIAGNOSIS — D696 Thrombocytopenia, unspecified: Secondary | ICD-10-CM | POA: Diagnosis not present

## 2021-11-01 DIAGNOSIS — E1151 Type 2 diabetes mellitus with diabetic peripheral angiopathy without gangrene: Secondary | ICD-10-CM | POA: Diagnosis not present

## 2021-11-01 DIAGNOSIS — I340 Nonrheumatic mitral (valve) insufficiency: Secondary | ICD-10-CM

## 2021-11-01 DIAGNOSIS — Z951 Presence of aortocoronary bypass graft: Secondary | ICD-10-CM | POA: Diagnosis not present

## 2021-11-01 DIAGNOSIS — Z79899 Other long term (current) drug therapy: Secondary | ICD-10-CM | POA: Insufficient documentation

## 2021-11-01 DIAGNOSIS — I5023 Acute on chronic systolic (congestive) heart failure: Secondary | ICD-10-CM | POA: Insufficient documentation

## 2021-11-01 DIAGNOSIS — R0789 Other chest pain: Secondary | ICD-10-CM | POA: Diagnosis not present

## 2021-11-01 LAB — CBC WITH DIFFERENTIAL/PLATELET
Abs Immature Granulocytes: 0.03 10*3/uL (ref 0.00–0.07)
Basophils Absolute: 0.1 10*3/uL (ref 0.0–0.1)
Basophils Relative: 1 %
Eosinophils Absolute: 0.2 10*3/uL (ref 0.0–0.5)
Eosinophils Relative: 3 %
HCT: 32.9 % — ABNORMAL LOW (ref 39.0–52.0)
Hemoglobin: 11 g/dL — ABNORMAL LOW (ref 13.0–17.0)
Immature Granulocytes: 0 %
Lymphocytes Relative: 14 %
Lymphs Abs: 1 10*3/uL (ref 0.7–4.0)
MCH: 30.1 pg (ref 26.0–34.0)
MCHC: 33.4 g/dL (ref 30.0–36.0)
MCV: 89.9 fL (ref 80.0–100.0)
Monocytes Absolute: 0.7 10*3/uL (ref 0.1–1.0)
Monocytes Relative: 9 %
Neutro Abs: 5.4 10*3/uL (ref 1.7–7.7)
Neutrophils Relative %: 73 %
Platelets: 133 10*3/uL — ABNORMAL LOW (ref 150–400)
RBC: 3.66 MIL/uL — ABNORMAL LOW (ref 4.22–5.81)
RDW: 18.4 % — ABNORMAL HIGH (ref 11.5–15.5)
WBC: 7.4 10*3/uL (ref 4.0–10.5)
nRBC: 0 % (ref 0.0–0.2)

## 2021-11-01 LAB — COMPREHENSIVE METABOLIC PANEL
ALT: 26 U/L (ref 0–44)
AST: 16 U/L (ref 15–41)
Albumin: 1.9 g/dL — ABNORMAL LOW (ref 3.5–5.0)
Alkaline Phosphatase: 146 U/L — ABNORMAL HIGH (ref 38–126)
Anion gap: 19 — ABNORMAL HIGH (ref 5–15)
BUN: 131 mg/dL — ABNORMAL HIGH (ref 6–20)
CO2: 19 mmol/L — ABNORMAL LOW (ref 22–32)
Calcium: 8 mg/dL — ABNORMAL LOW (ref 8.9–10.3)
Chloride: 92 mmol/L — ABNORMAL LOW (ref 98–111)
Creatinine, Ser: 21.1 mg/dL — ABNORMAL HIGH (ref 0.61–1.24)
GFR, Estimated: 2 mL/min — ABNORMAL LOW (ref >60)
Glucose, Bld: 203 mg/dL — ABNORMAL HIGH (ref 70–99)
Potassium: 3.7 mmol/L (ref 3.5–5.1)
Sodium: 130 mmol/L — ABNORMAL LOW (ref 135–145)
Total Bilirubin: 0.8 mg/dL (ref 0.3–1.2)
Total Protein: 6 g/dL — ABNORMAL LOW (ref 6.5–8.1)

## 2021-11-01 LAB — LIPASE, BLOOD: Lipase: 30 U/L (ref 11–51)

## 2021-11-01 LAB — MAGNESIUM: Magnesium: 2 mg/dL (ref 1.7–2.4)

## 2021-11-01 LAB — TROPONIN I (HIGH SENSITIVITY): Troponin I (High Sensitivity): 1054 ng/L (ref <18)

## 2021-11-01 LAB — PROTIME-INR
INR: 1.3 — ABNORMAL HIGH (ref 0.8–1.2)
Prothrombin Time: 15.8 seconds — ABNORMAL HIGH (ref 11.4–15.2)

## 2021-11-01 LAB — BRAIN NATRIURETIC PEPTIDE: B Natriuretic Peptide: 1398.1 pg/mL — ABNORMAL HIGH (ref 0.0–100.0)

## 2021-11-01 LAB — LACTIC ACID, PLASMA: Lactic Acid, Venous: 1.5 mmol/L (ref 0.5–1.9)

## 2021-11-01 NOTE — Telephone Encounter (Signed)
Medication Refill - Medication: Glucose True Met[employer] test kit for Glucose  Has the patient contacted their pharmacy? Yes.   (Agent: If no, request that the patient contact the pharmacy for the refill. If patient does not wish to contact the pharmacy document the reason why and proceed with request.) (Agent: If yes, when and what did the pharmacy advise?)  Preferred Pharmacy (with phone number or street name): [facility]   Has the patient been seen for an appointment in the last year OR does the patient have an upcoming appointment? Yes.    Agent: Please be advised that RX refills may take up to 3 business days. We ask that you follow-up with your pharmacy.

## 2021-11-01 NOTE — ED Triage Notes (Signed)
Pt reports dizziness/lightheaded since this morning with waking. Pt states ate and drank today. Denies N/V/diarrhea. Pt does home peritoneal dialysis. Reports good compliance with medications/home therapies. Pt reports began having CP about hour ago. Reports pain along left side of chest nonradiation of pain. Reports cough but no SOB. Denies recent fevers.

## 2021-11-02 ENCOUNTER — Ambulatory Visit (HOSPITAL_COMMUNITY): Admission: EM | Disposition: E | Payer: Self-pay | Source: Home / Self Care | Attending: Emergency Medicine

## 2021-11-02 ENCOUNTER — Observation Stay (HOSPITAL_BASED_OUTPATIENT_CLINIC_OR_DEPARTMENT_OTHER): Payer: Medicare Other

## 2021-11-02 DIAGNOSIS — I340 Nonrheumatic mitral (valve) insufficiency: Secondary | ICD-10-CM

## 2021-11-02 DIAGNOSIS — I25110 Atherosclerotic heart disease of native coronary artery with unstable angina pectoris: Secondary | ICD-10-CM | POA: Diagnosis not present

## 2021-11-02 DIAGNOSIS — Z992 Dependence on renal dialysis: Secondary | ICD-10-CM | POA: Diagnosis not present

## 2021-11-02 DIAGNOSIS — I214 Non-ST elevation (NSTEMI) myocardial infarction: Secondary | ICD-10-CM | POA: Diagnosis not present

## 2021-11-02 DIAGNOSIS — I5023 Acute on chronic systolic (congestive) heart failure: Secondary | ICD-10-CM

## 2021-11-02 DIAGNOSIS — R29898 Other symptoms and signs involving the musculoskeletal system: Secondary | ICD-10-CM

## 2021-11-02 DIAGNOSIS — I469 Cardiac arrest, cause unspecified: Secondary | ICD-10-CM | POA: Diagnosis not present

## 2021-11-02 DIAGNOSIS — N186 End stage renal disease: Secondary | ICD-10-CM | POA: Diagnosis not present

## 2021-11-02 HISTORY — PX: RIGHT/LEFT HEART CATH AND CORONARY/GRAFT ANGIOGRAPHY: CATH118267

## 2021-11-02 LAB — POCT I-STAT 7, (LYTES, BLD GAS, ICA,H+H)
Acid-base deficit: 8 mmol/L — ABNORMAL HIGH (ref 0.0–2.0)
Acid-base deficit: 11 mmol/L — ABNORMAL HIGH (ref 0.0–2.0)
Acid-base deficit: 9 mmol/L — ABNORMAL HIGH (ref 0.0–2.0)
Bicarbonate: 17.1 mmol/L — ABNORMAL LOW (ref 20.0–28.0)
Bicarbonate: 13.9 mmol/L — ABNORMAL LOW (ref 20.0–28.0)
Bicarbonate: 15.9 mmol/L — ABNORMAL LOW (ref 20.0–28.0)
Calcium, Ion: 1.07 mmol/L — ABNORMAL LOW (ref 1.15–1.40)
Calcium, Ion: 1.07 mmol/L — ABNORMAL LOW (ref 1.15–1.40)
Calcium, Ion: 1.85 mmol/L (ref 1.15–1.40)
HCT: 29 % — ABNORMAL LOW (ref 39.0–52.0)
HCT: 31 % — ABNORMAL LOW (ref 39.0–52.0)
HCT: 32 % — ABNORMAL LOW (ref 39.0–52.0)
Hemoglobin: 9.9 g/dL — ABNORMAL LOW (ref 13.0–17.0)
Hemoglobin: 10.5 g/dL — ABNORMAL LOW (ref 13.0–17.0)
Hemoglobin: 10.9 g/dL — ABNORMAL LOW (ref 13.0–17.0)
O2 Saturation: 90 %
O2 Saturation: 96 %
O2 Saturation: 98 %
Potassium: 3.5 mmol/L (ref 3.5–5.1)
Potassium: 4.1 mmol/L (ref 3.5–5.1)
Potassium: 4.2 mmol/L (ref 3.5–5.1)
Sodium: 124 mmol/L — ABNORMAL LOW (ref 135–145)
Sodium: 129 mmol/L — ABNORMAL LOW (ref 135–145)
Sodium: 130 mmol/L — ABNORMAL LOW (ref 135–145)
TCO2: 18 mmol/L — ABNORMAL LOW (ref 22–32)
TCO2: 15 mmol/L — ABNORMAL LOW (ref 22–32)
TCO2: 17 mmol/L — ABNORMAL LOW (ref 22–32)
pCO2 arterial: 32.3 mmHg (ref 32–48)
pCO2 arterial: 22.2 mmHg — ABNORMAL LOW (ref 32–48)
pCO2 arterial: 40.7 mmHg (ref 32–48)
pH, Arterial: 7.332 — ABNORMAL LOW (ref 7.35–7.45)
pH, Arterial: 7.2 — ABNORMAL LOW (ref 7.35–7.45)
pH, Arterial: 7.406 (ref 7.35–7.45)
pO2, Arterial: 62 mmHg — ABNORMAL LOW (ref 83–108)
pO2, Arterial: 101 mmHg (ref 83–108)
pO2, Arterial: 102 mmHg (ref 83–108)

## 2021-11-02 LAB — POCT I-STAT, CHEM 8
BUN: >130 mg/dL — ABNORMAL HIGH (ref 6–20)
Calcium, Ion: 1.04 mmol/L — ABNORMAL LOW (ref 1.15–1.40)
Chloride: 98 mmol/L (ref 98–111)
Creatinine, Ser: >18 mg/dL — ABNORMAL HIGH (ref 0.61–1.24)
Glucose, Bld: 122 mg/dL — ABNORMAL HIGH (ref 70–99)
HCT: 32 % — ABNORMAL LOW (ref 39.0–52.0)
Hemoglobin: 10.9 g/dL — ABNORMAL LOW (ref 13.0–17.0)
Potassium: 4.2 mmol/L (ref 3.5–5.1)
Sodium: 129 mmol/L — ABNORMAL LOW (ref 135–145)
TCO2: 14 mmol/L — ABNORMAL LOW (ref 22–32)

## 2021-11-02 LAB — RENAL FUNCTION PANEL
Albumin: 1.7 g/dL — ABNORMAL LOW (ref 3.5–5.0)
Anion gap: 20 — ABNORMAL HIGH (ref 5–15)
BUN: 130 mg/dL — ABNORMAL HIGH (ref 6–20)
CO2: 18 mmol/L — ABNORMAL LOW (ref 22–32)
Calcium: 8.1 mg/dL — ABNORMAL LOW (ref 8.9–10.3)
Chloride: 92 mmol/L — ABNORMAL LOW (ref 98–111)
Creatinine, Ser: 20.14 mg/dL — ABNORMAL HIGH (ref 0.61–1.24)
GFR, Estimated: 3 mL/min — ABNORMAL LOW (ref >60)
Glucose, Bld: 126 mg/dL — ABNORMAL HIGH (ref 70–99)
Phosphorus: 11.6 mg/dL — ABNORMAL HIGH (ref 2.5–4.6)
Potassium: 3.5 mmol/L (ref 3.5–5.1)
Sodium: 130 mmol/L — ABNORMAL LOW (ref 135–145)

## 2021-11-02 LAB — POCT I-STAT EG7
Acid-base deficit: 5 mmol/L — ABNORMAL HIGH (ref 0.0–2.0)
Acid-base deficit: 5 mmol/L — ABNORMAL HIGH (ref 0.0–2.0)
Bicarbonate: 19.6 mmol/L — ABNORMAL LOW (ref 20.0–28.0)
Bicarbonate: 19.8 mmol/L — ABNORMAL LOW (ref 20.0–28.0)
Calcium, Ion: 1.06 mmol/L — ABNORMAL LOW (ref 1.15–1.40)
Calcium, Ion: 1.08 mmol/L — ABNORMAL LOW (ref 1.15–1.40)
HCT: 29 % — ABNORMAL LOW (ref 39.0–52.0)
HCT: 29 % — ABNORMAL LOW (ref 39.0–52.0)
Hemoglobin: 9.9 g/dL — ABNORMAL LOW (ref 13.0–17.0)
Hemoglobin: 9.9 g/dL — ABNORMAL LOW (ref 13.0–17.0)
O2 Saturation: 61 %
O2 Saturation: 61 %
Potassium: 3.6 mmol/L (ref 3.5–5.1)
Potassium: 3.6 mmol/L (ref 3.5–5.1)
Sodium: 132 mmol/L — ABNORMAL LOW (ref 135–145)
Sodium: 131 mmol/L — ABNORMAL LOW (ref 135–145)
TCO2: 21 mmol/L — ABNORMAL LOW (ref 22–32)
TCO2: 21 mmol/L — ABNORMAL LOW (ref 22–32)
pCO2, Ven: 35.4 mmHg — ABNORMAL LOW (ref 44–60)
pCO2, Ven: 35.7 mmHg — ABNORMAL LOW (ref 44–60)
pH, Ven: 7.351 (ref 7.25–7.43)
pH, Ven: 7.353 (ref 7.25–7.43)
pO2, Ven: 33 mmHg (ref 32–45)
pO2, Ven: 33 mmHg (ref 32–45)

## 2021-11-02 LAB — CBC
HCT: 31.9 % — ABNORMAL LOW (ref 39.0–52.0)
Hemoglobin: 10.6 g/dL — ABNORMAL LOW (ref 13.0–17.0)
MCH: 29.3 pg (ref 26.0–34.0)
MCHC: 33.2 g/dL (ref 30.0–36.0)
MCV: 88.1 fL (ref 80.0–100.0)
Platelets: 136 10*3/uL — ABNORMAL LOW (ref 150–400)
RBC: 3.62 MIL/uL — ABNORMAL LOW (ref 4.22–5.81)
RDW: 18.4 % — ABNORMAL HIGH (ref 11.5–15.5)
WBC: 8.3 10*3/uL (ref 4.0–10.5)
nRBC: 0 % (ref 0.0–0.2)

## 2021-11-02 LAB — ECHOCARDIOGRAM COMPLETE
Area-P 1/2: 4.12 cm2
Height: 70 in
MV M vel: 3.84 m/s
MV Peak grad: 59 mmHg
MV VTI: 1.19 cm2
Radius: 1.9 cm
S' Lateral: 4.5 cm
Weight: 3488 oz

## 2021-11-02 LAB — TROPONIN I (HIGH SENSITIVITY): Troponin I (High Sensitivity): 992 ng/L (ref <18)

## 2021-11-02 LAB — GLUCOSE, CAPILLARY: Glucose-Capillary: 120 mg/dL — ABNORMAL HIGH (ref 70–99)

## 2021-11-02 LAB — LIPID PANEL
Cholesterol: 119 mg/dL (ref 0–200)
HDL: 41 mg/dL (ref >40)
LDL Cholesterol: 63 mg/dL (ref 0–99)
Total CHOL/HDL Ratio: 2.9 RATIO
Triglycerides: 73 mg/dL (ref <150)
VLDL: 15 mg/dL (ref 0–40)

## 2021-11-02 LAB — CBG MONITORING, ED: Glucose-Capillary: 104 mg/dL — ABNORMAL HIGH (ref 70–99)

## 2021-11-02 SURGERY — RIGHT/LEFT HEART CATH AND CORONARY/GRAFT ANGIOGRAPHY
Anesthesia: LOCAL

## 2021-11-02 MED ORDER — HEPARIN (PORCINE) IN NACL 1000-0.9 UT/500ML-% IV SOLN
INTRAVENOUS | Status: DC | PRN
  Administered 2021-11-02: 500 mL

## 2021-11-02 MED ORDER — INSULIN ASPART 100 UNIT/ML IJ SOLN: 0.0000 [IU] | INTRAMUSCULAR | Status: DC

## 2021-11-02 MED ORDER — ROSUVASTATIN CALCIUM 20 MG PO TABS
40.0000 mg | ORAL_TABLET | Freq: Every day | ORAL | Status: DC
  Administered 2021-11-02: 40 mg via ORAL
  Filled 2021-11-02: qty 2

## 2021-11-02 MED ORDER — VASOPRESSIN 20 UNITS/100 ML INFUSION FOR SHOCK
0.0000 [IU]/min | INTRAVENOUS | Status: AC
  Administered 2021-11-02: 0.03 [IU]/min via INTRAVENOUS
  Filled 2021-11-02: qty 100

## 2021-11-02 MED ORDER — HEPARIN BOLUS VIA INFUSION
4000.0000 [IU] | Freq: Once | INTRAVENOUS | Status: AC
  Administered 2021-11-02: 4000 [IU] via INTRAVENOUS
  Filled 2021-11-02: qty 4000

## 2021-11-02 MED ORDER — SODIUM BICARBONATE 8.4 % IV SOLN
INTRAVENOUS | Status: AC
  Filled 2021-11-02: qty 200

## 2021-11-02 MED ORDER — LIDOCAINE HCL (PF) 1 % IJ SOLN
INTRAMUSCULAR | Status: AC
  Filled 2021-11-02: qty 30

## 2021-11-02 MED ORDER — IOHEXOL 350 MG/ML SOLN
INTRAVENOUS | Status: DC | PRN
  Administered 2021-11-02: 110 mL

## 2021-11-02 MED ORDER — SODIUM BICARBONATE 8.4 % IV SOLN
INTRAVENOUS | Status: DC | PRN
  Administered 2021-11-02 (×3): 100 meq via INTRAVENOUS

## 2021-11-02 MED ORDER — ONDANSETRON HCL 4 MG/2ML IJ SOLN
INTRAMUSCULAR | Status: DC | PRN
  Administered 2021-11-02: 4 mg via INTRAVENOUS

## 2021-11-02 MED ORDER — ACETAMINOPHEN 325 MG PO TABS
650.0000 mg | ORAL_TABLET | Freq: Four times a day (QID) | ORAL | Status: DC | PRN
  Administered 2021-11-02: 650 mg via ORAL
  Filled 2021-11-02: qty 2

## 2021-11-02 MED ORDER — HEPARIN (PORCINE) IN NACL 1000-0.9 UT/500ML-% IV SOLN
INTRAVENOUS | Status: AC
  Filled 2021-11-02: qty 1000

## 2021-11-02 MED ORDER — ACETAMINOPHEN 650 MG RE SUPP: 650.0000 mg | Freq: Four times a day (QID) | RECTAL | Status: DC | PRN

## 2021-11-02 MED ORDER — EPINEPHRINE HCL 5 MG/250ML IV SOLN IN NS
INTRAVENOUS | Status: DC | PRN
  Administered 2021-11-02: 2 ug/min via INTRAVENOUS

## 2021-11-02 MED ORDER — ASPIRIN 81 MG PO CHEW
81.0000 mg | CHEWABLE_TABLET | ORAL | Status: AC
  Administered 2021-11-02: 81 mg via ORAL
  Filled 2021-11-02: qty 1

## 2021-11-02 MED ORDER — EZETIMIBE 10 MG PO TABS
10.0000 mg | ORAL_TABLET | Freq: Every day | ORAL | Status: DC
  Administered 2021-11-02: 10 mg via ORAL
  Filled 2021-11-02: qty 1

## 2021-11-02 MED ORDER — AMIODARONE HCL 150 MG/3ML IV SOLN
INTRAVENOUS | Status: AC
  Filled 2021-11-02: qty 9

## 2021-11-02 MED ORDER — HEPARIN (PORCINE) 25000 UT/250ML-% IV SOLN
1100.0000 [IU]/h | INTRAVENOUS | Status: DC
  Administered 2021-11-02: 1100 [IU]/h via INTRAVENOUS
  Filled 2021-11-02: qty 250

## 2021-11-02 MED ORDER — AMIODARONE LOAD VIA INFUSION
INTRAVENOUS | Status: DC | PRN
  Administered 2021-11-02: 300 mg via INTRAVENOUS

## 2021-11-02 MED ORDER — EPINEPHRINE 1 MG/10ML IJ SOSY
PREFILLED_SYRINGE | INTRAMUSCULAR | Status: DC | PRN
  Administered 2021-11-02: 1 mg via INTRAVENOUS
  Administered 2021-11-02: 2 mg via INTRAVENOUS
  Administered 2021-11-02 (×2): 1 mg via INTRAVENOUS
  Administered 2021-11-02: 2 mg via INTRAVENOUS
  Administered 2021-11-02: 1 mg via INTRAVENOUS

## 2021-11-02 MED ORDER — LIDOCAINE HCL (PF) 1 % IJ SOLN
INTRAMUSCULAR | Status: DC | PRN
  Administered 2021-11-02: 10 mL

## 2021-11-02 MED ORDER — NITROGLYCERIN 0.4 MG SL SUBL
0.4000 mg | SUBLINGUAL_TABLET | SUBLINGUAL | Status: DC | PRN
Start: 2021-11-02 — End: 2021-11-02

## 2021-11-02 MED ORDER — AMIODARONE HCL 150 MG/3ML IV SOLN
INTRAVENOUS | Status: DC | PRN
  Administered 2021-11-02: 150 mg via INTRAVENOUS

## 2021-11-02 MED ORDER — SODIUM CHLORIDE 0.9 % IV SOLN
INTRAVENOUS | Status: DC
Start: 2021-11-02 — End: 2021-11-02

## 2021-11-02 MED ORDER — LIDOCAINE HCL (CARDIAC) PF 100 MG/5ML IV SOSY
PREFILLED_SYRINGE | INTRAVENOUS | Status: DC | PRN
  Administered 2021-11-02: 100 mL via INTRAVENOUS

## 2021-11-02 MED ORDER — CALCIUM CHLORIDE 10 % IV SOLN
INTRAVENOUS | Status: AC
  Filled 2021-11-02: qty 20

## 2021-11-02 MED ORDER — TRUE METRIX BLOOD GLUCOSE TEST VI STRP: ORAL_STRIP | 3 refills | Status: AC

## 2021-11-02 MED ORDER — EPINEPHRINE 1 MG/10ML IJ SOSY
PREFILLED_SYRINGE | INTRAMUSCULAR | Status: AC
  Filled 2021-11-02: qty 50

## 2021-11-02 MED ORDER — ONDANSETRON HCL 4 MG/2ML IJ SOLN
INTRAMUSCULAR | Status: AC
  Filled 2021-11-02: qty 2

## 2021-11-02 MED ORDER — NOREPINEPHRINE 4 MG/250ML-% IV SOLN
INTRAVENOUS | Status: AC
  Filled 2021-11-02: qty 250

## 2021-11-02 MED ORDER — SODIUM CHLORIDE 0.9 % IV SOLN
INTRAVENOUS | Status: DC | PRN
  Administered 2021-11-02: 500 mL via INTRAVENOUS

## 2021-11-02 MED ORDER — CALCIUM CHLORIDE 10 % IV SOLN
INTRAVENOUS | Status: DC | PRN
  Administered 2021-11-02 (×2): 1 g via INTRAVENOUS

## 2021-11-02 MED ORDER — ATROPINE SULFATE 1 MG/10ML IJ SOSY
PREFILLED_SYRINGE | INTRAMUSCULAR | Status: AC
  Filled 2021-11-02: qty 10

## 2021-11-02 MED ORDER — LIDOCAINE HCL (CARDIAC) PF 100 MG/5ML IV SOSY
PREFILLED_SYRINGE | INTRAVENOUS | Status: AC
  Filled 2021-11-02: qty 5

## 2021-11-02 MED ORDER — NOREPINEPHRINE BITARTRATE 1 MG/ML IV SOLN
INTRAVENOUS | Status: DC | PRN
  Administered 2021-11-02: 5 ug/min via INTRAVENOUS

## 2021-11-02 SURGICAL SUPPLY — 13 items
CATH INFINITI 5 FR IM (CATHETERS) ×1 IMPLANT
CATH INFINITI 5FR MULTPACK ANG (CATHETERS) ×1 IMPLANT
CATH SWAN GANZ 7F STRAIGHT (CATHETERS) ×1 IMPLANT
KIT HEART LEFT (KITS) ×2 IMPLANT
MAT PREVALON FULL STRYKER (MISCELLANEOUS) ×1 IMPLANT
PACK CARDIAC CATHETERIZATION (CUSTOM PROCEDURE TRAY) ×2 IMPLANT
SHEATH PINNACLE 5F 10CM (SHEATH) ×1 IMPLANT
SHEATH PINNACLE 7F 10CM (SHEATH) ×1 IMPLANT
SLEEVE REPOSITIONING LENGTH 30 (MISCELLANEOUS) ×1 IMPLANT
TRANSDUCER W/STOPCOCK (MISCELLANEOUS) ×2 IMPLANT
TUBING CIL FLEX 10 FLL-RA (TUBING) ×2 IMPLANT
WIRE EMERALD 3MM-J .035X150CM (WIRE) ×1 IMPLANT
WIRE EMERALD 3MM-J .035X260CM (WIRE) ×1 IMPLANT

## 2021-11-03 ENCOUNTER — Encounter (HOSPITAL_COMMUNITY): Payer: Self-pay | Admitting: Cardiology

## 2021-11-03 DIAGNOSIS — Z992 Dependence on renal dialysis: Secondary | ICD-10-CM | POA: Diagnosis not present

## 2021-11-03 DIAGNOSIS — N186 End stage renal disease: Secondary | ICD-10-CM | POA: Diagnosis not present

## 2021-11-03 MED FILL — Heparin Sod (Porcine)-NaCl IV Soln 1000 Unit/500ML-0.9%: INTRAVENOUS | Qty: 500 | Status: AC

## 2021-11-03 MED FILL — Atropine Sulfate Soln Prefill Syr 1 MG/10ML (0.1 MG/ML): INTRAMUSCULAR | Qty: 10 | Status: AC

## 2021-11-04 DIAGNOSIS — N186 End stage renal disease: Secondary | ICD-10-CM | POA: Diagnosis not present

## 2021-11-04 DIAGNOSIS — Z992 Dependence on renal dialysis: Secondary | ICD-10-CM | POA: Diagnosis not present

## 2021-11-05 DIAGNOSIS — Z992 Dependence on renal dialysis: Secondary | ICD-10-CM | POA: Diagnosis not present

## 2021-11-05 DIAGNOSIS — N186 End stage renal disease: Secondary | ICD-10-CM | POA: Diagnosis not present

## 2021-11-06 DIAGNOSIS — N186 End stage renal disease: Secondary | ICD-10-CM | POA: Diagnosis not present

## 2021-11-06 DIAGNOSIS — Z992 Dependence on renal dialysis: Secondary | ICD-10-CM | POA: Diagnosis not present

## 2021-11-06 LAB — CULTURE, BLOOD (ROUTINE X 2)
Culture: NO GROWTH
Culture: NO GROWTH
Special Requests: ADEQUATE
Special Requests: ADEQUATE

## 2021-11-07 ENCOUNTER — Telehealth: Payer: Self-pay | Admitting: Cardiology

## 2021-11-07 DIAGNOSIS — Z992 Dependence on renal dialysis: Secondary | ICD-10-CM | POA: Diagnosis not present

## 2021-11-07 DIAGNOSIS — N186 End stage renal disease: Secondary | ICD-10-CM | POA: Diagnosis not present

## 2021-11-07 NOTE — Telephone Encounter (Signed)
Routed to Dr. Ellyn Hack - who spoke with patient's wife after patient passed

## 2021-11-07 NOTE — Telephone Encounter (Signed)
Patient's wife calling to speak with someone about the cause of the patient's death.

## 2021-11-07 NOTE — Telephone Encounter (Signed)
I think it may be something about the death certificate.

## 2021-11-08 NOTE — Progress Notes (Signed)
Heart Failure Navigator Progress Note  Assessed for Heart & Vascular TOC clinic readiness.  Patient does not meet criteria due to ESRD on PD.   Kerby Nora, PharmD, BCPS Heart Failure Stewardship Pharmacist Phone [phone]

## 2021-11-08 NOTE — Code Documentation (Signed)
Pt admitted to [facility] on 7/25 with chest pain and diagnosed with NSTEMI. He was taken to the cath lab for a right heart cath on 7/26 around 11am. Code stroke called at 1228 by cath lab RN. Pt on the table in the procedure. MD noted patient had right sided weakness and was not responding normally after procedure began. When SRN and RRT arrived pt was under the sterile dressing and exam was limited. Pt intubated by anesthesia. CCM also at bedside. Pt when into VFIB and was coded for 14 min receiving ACLS. Pt was eventually pronounced deceased by MD and team at 1257.   Donald [name], Rande Brunt, RN

## 2021-11-08 NOTE — Procedures (Signed)
Cardiopulmonary Resuscitation Note  Donald [name]  [id number]  [birth year]  Date:11-17-21  Time:1:10 PM   Provider Performing:Patirica Longshore   Procedure: Cardiopulmonary Resuscitation (44360)  Indication(s) Loss of Pulse  Consent N/A  Anesthesia N/A   Time Out N/A   Sterile Technique Hand hygiene, gloves   Procedure Description Called to patient's room for CODE BLUE. Initial rhythm was Vfib/Vtach. Patient received high quality chest compressions for 13 minutes with defibrillation or cardioversion when appropriate. Epinephrine was administered every 3 minutes as directed by time Therapist, nutritional. Additional pharmacologic interventions included amiodarone, calcium chloride, sodium bicarbonate, vasopressin, and lidocaine. Additional procedural interventions include bedside echo.  Return of spontaneous circulation was not achieved.  Family to be notified.   Complications/Tolerance N/A   EBL N/A   Specimen(s) N/A

## 2021-11-08 NOTE — Telephone Encounter (Signed)
Rx sent to preferred pharmacy.

## 2021-11-08 NOTE — Progress Notes (Signed)
Patient was admitted early morning hours by nighttime hospitalist.  Briefly examined him with cardiology team in the morning and he was taken to cardiac cath.  At the time of my exam, patient denied any chest pain.  He was asking about food and he knew that he is needing to be n.p.o. for procedure.  I notified nephrology team for dialysis.  Unfortunately, he developed cardiogenic shock and died on the cath table despite maximal effort. Code was run by cardiology team at Cath Lab, attended by critical care, anesthesia and neurology.  Death summary as documented by Dr. Ellyn Hack.  Family were notified.  Primary cause of death,  cardiogenic shock secondary to non-ST elevation MI ESRD on peritoneal dialysis CAD status post CABG Chronic systolic congestive heart failure Insulin-dependent diabetes Hypertension, hyperlipidemia  No charge visit.

## 2021-11-08 NOTE — ED Provider Notes (Signed)
[facility] EMERGENCY DEPARTMENT Provider Note   CSN: [id number] Arrival date & time: 10/29/2021  2031     History  Chief Complaint  Patient presents with   Dizziness    Donald [name] is a 51 y.o. male.  HPI 51 year old male with an extensive medical history including ESRD on peritoneal dialysis, CAD s/p CABG, hypertension, DM type II, hypertension, history of GI bleed, HFrEF EF of 40 to 45% presents to the ER with complaints of chest pain.  Patient states that he performed peritoneal dialysis at home yesterday and was in his normal state of health.  Earlier today he started to develop severe left-sided chest pain which was constant in nature.  He took his Imdur but did not have any relief.  He also felt dizzy and weak.  He states he took his blood pressure at home and it was 80/60.  He denies any syncope or loss of consciousness.  He reports adequate intake.  Denies any fevers or chills.  Has had a dry cough but no shortness of breath.    Home Medications Prior to Admission medications   Medication Sig Start Date End Date Taking? Authorizing Provider  aspirin EC 81 MG EC tablet Take 1 tablet (81 mg total) by mouth daily. 07/17/19   Nita Sells, MD  calcitRIOL (ROCALTROL) 0.5 MCG capsule Take 2 mcg by mouth daily. Take 4 tablets (2 mcg) daily.    [provider]  calcium acetate (PHOSLO) 667 MG capsule Take 2,001 mg by mouth See admin instructions. Take 2,001 mg with each meal. 06/30/20   [provider]  carvedilol (COREG) 12.5 MG tablet Take 1 tablet (12.5 mg total) by mouth 2 (two) times daily with a meal. 09/09/20   Donato Heinz, MD  clopidogrel (PLAVIX) 75 MG tablet Take 1 tablet (75 mg total) by mouth daily. 02/25/21   [facility], Tami Lin, PA  Continuous Blood Gluc Sensor ([place]) MISC Use to check blood sugar at least 4 times daily. Change sensor Q 2 wks 06/04/20   Ladell Pier, MD  doxycycline  (VIBRAMYCIN) 100 MG capsule Take 1 capsule (100 mg total) by mouth 2 (two) times daily. 09/08/21   Isla Pence, MD  ezetimibe (ZETIA) 10 MG tablet Take 1 tablet (10 mg total) by mouth daily. 02/25/21 07/20/21  Ledora Bottcher, PA  gabapentin (NEURONTIN) 100 MG capsule 1 cap PO every other day 08/09/21   Ladell Pier, MD  gentamicin cream (GARAMYCIN) 0.1 % Apply 1 application topically every morning. Apply to exit site every day    [provider]  HYDROcodone-acetaminophen (NORCO/VICODIN) 5-325 MG tablet Take 1 tablet by mouth every 4 (four) hours as needed. 09/08/21   Isla Pence, MD  insulin lispro (HUMALOG KWIKPEN) 100 UNIT/ML KwikPen Inject 6 Units into the skin 3 (three) times daily. 03/28/21   Ladell Pier, MD  Insulin Pen Needle (TRUEPLUS PEN NEEDLES) 31G X 6 MM MISC Use to inject Novolog TID and Lantus once daily. Total injections in 1 day = 4. 09/13/20   Ladell Pier, MD  Ipratropium-Albuterol (COMBIVENT) 20-100 MCG/ACT AERS respimat Inhale 1 puff into the lungs every 6 (six) hours as needed for wheezing. 08/09/21   Ladell Pier, MD  isosorbide dinitrate (ISORDIL) 30 MG tablet Take 1 tablet (30 mg total) by mouth 2 (two) times daily. Patient taking differently: Take 30 mg by mouth daily. 01/29/21 01/29/22  Richardson Dopp T, PA-C  LANTUS SOLOSTAR 100 UNIT/ML  Solostar Pen ADMINISTER 20 UNITS UNDER THE SKIN DAILY Patient taking differently: Inject 20 Units into the skin at bedtime. 06/21/21   Ladell Pier, MD  mometasone-formoterol (DULERA) 100-5 MCG/ACT AERO Inhale 2 puffs into the lungs 2 (two) times daily. 08/09/21   Ladell Pier, MD  nitroGLYCERIN (NITROSTAT) 0.4 MG SL tablet Place 1 tablet (0.4 mg total) under the tongue every 5 (five) minutes as needed for chest pain. 01/29/21   Richardson Dopp T, PA-C  Peritoneal Dialysis Solutions (DIALYSIS SOLUTION 2.5% LOW-MG/LOW-CA) 394 MOSM/L SOLN dianeal solution As per nephro 07/16/19   Nita Sells, MD   polyethylene glycol powder (GLYCOLAX/MIRALAX) 17 GM/SCOOP powder Take 1 Container by mouth daily as needed for mild constipation.    [provider]  rosuvastatin (CRESTOR) 40 MG tablet Take 40 mg by mouth in the morning. 12/19/19   [provider]  senna (SENOKOT) 8.6 MG tablet Take 2 tablets (17.2 mg total) by mouth daily. 07/16/19   Nita Sells, MD      Allergies    Other and Lisinopril    Review of Systems   Review of Systems Ten systems reviewed and are negative for acute change, except as noted in the HPI.   Physical Exam Updated Vital Signs BP 105/75   Pulse 81   Temp (!) 97.5 F (36.4 C) (Oral)   Resp (!) 21   Ht _0  (1.778 m)   Wt 98.9 kg   SpO2 98%   BMI 31.28 kg/m  Physical Exam Vitals and nursing note reviewed.  Constitutional:      General: He is not in acute distress.    Appearance: He is well-developed.     Comments: Chronically ill-appearing  HENT:     Head: Normocephalic and atraumatic.  Eyes:     Conjunctiva/sclera: Conjunctivae normal.  Cardiovascular:     Rate and Rhythm: Normal rate and regular rhythm.     Heart sounds: No murmur heard.    Comments: CABG scar well-healed Pulmonary:     Effort: Pulmonary effort is normal. No respiratory distress.     Breath sounds: Normal breath sounds.  Abdominal:     Palpations: Abdomen is soft.     Tenderness: There is no abdominal tenderness.     Comments: Abdomen distended but soft, nontender, no peritoneal signs, no guarding  Musculoskeletal:        General: No swelling.     Cervical back: Neck supple.     Comments: Trace edema to lower extremities.  Mild reproducible left-sided chest wall tenderness.  Skin:    General: Skin is warm and dry.     Capillary Refill: Capillary refill takes less than 2 seconds.  Neurological:     Mental Status: He is alert.  Psychiatric:        Mood and Affect: Mood normal.     ED Results / Procedures / Treatments   Labs (all labs ordered  are listed, but only abnormal results are displayed) Labs Reviewed  COMPREHENSIVE METABOLIC PANEL - Abnormal; Notable for the following components:      Result Value   Sodium 130 (*)    Chloride 92 (*)    CO2 19 (*)    Glucose, Bld 203 (*)    BUN 131 (*)    Creatinine, Ser 21.10 (*)    Calcium 8.0 (*)    Total Protein 6.0 (*)    Albumin 1.9 (*)    Alkaline Phosphatase 146 (*)    GFR, Estimated 2 (*)  Anion gap 19 (*)    All other components within normal limits  CBC WITH DIFFERENTIAL/PLATELET - Abnormal; Notable for the following components:   RBC 3.66 (*)    Hemoglobin 11.0 (*)    HCT 32.9 (*)    RDW 18.4 (*)    Platelets 133 (*)    All other components within normal limits  PROTIME-INR - Abnormal; Notable for the following components:   Prothrombin Time 15.8 (*)    INR 1.3 (*)    All other components within normal limits  BRAIN NATRIURETIC PEPTIDE - Abnormal; Notable for the following components:   B Natriuretic Peptide 1,398.1 (*)    All other components within normal limits  TROPONIN I (HIGH SENSITIVITY) - Abnormal; Notable for the following components:   Troponin I (High Sensitivity) 1,054 (*)    All other components within normal limits  TROPONIN I (HIGH SENSITIVITY) - Abnormal; Notable for the following components:   Troponin I (High Sensitivity) 992 (*)    All other components within normal limits  CULTURE, BLOOD (ROUTINE X 2)  CULTURE, BLOOD (ROUTINE X 2)  LACTIC ACID, PLASMA  MAGNESIUM  LIPASE, BLOOD  HEPARIN LEVEL (UNFRACTIONATED)  CBC    EKG EKG Interpretation  Date/Time:  Tuesday November 01 2021 20:50:05 EDT Ventricular Rate:  83 PR Interval:  168 QRS Duration: 112 QT Interval:  404 QTC Calculation: 474 R Axis:   56 Text Interpretation: Normal sinus rhythm ST & T wave abnormality, consider inferior ischemia ST & T wave abnormality, consider anterolateral ischemia Prolonged QT Abnormal ECG When compared with ECG of 21-Jul-2021 01:07, No significant  change was found Confirmed by Orpah Greek [phone]) on 2021-11-04 2:03:02 AM  Radiology DG Chest 2 View  Result Date: 10/14/2021 CLINICAL DATA:  Chest pain EXAM: CHEST - 2 VIEW COMPARISON:  07/20/2021 FINDINGS: Post sternotomy changes. Cardiomegaly with vascular congestion. Possible tiny left effusion. Vascular stent in the left axillary region. No pneumothorax. IMPRESSION: Cardiomegaly with vascular congestion and possible tiny left effusion Electronically Signed   By: Donavan Foil M.D.   On: 10/31/2021 21:37    Procedures Procedures    Medications Ordered in ED Medications  nitroGLYCERIN (NITROSTAT) SL tablet 0.4 mg (has no administration in time range)  heparin ADULT infusion 100 units/mL (25000 units/267m) (1,100 Units/hr Intravenous New Bag/Given [id number])  heparin bolus via infusion 4,000 Units (4,000 Units Intravenous Bolus from Bag 72023/07/280244)    ED Course/ Medical Decision Making/ A&P                           Medical Decision Making Amount and/or Complexity of Data Reviewed Labs: ordered.  Risk Prescription drug management. Decision regarding hospitalization.   This patient presents to the ED for concern of chest pain this involves a number of treatment options, and is a complaint that carries with it a high risk of complications and morbidity.  The differential diagnosis includes ACS, PE, pneumonia, pericarditis/myocarditis, pericardial effusion/tamponade, GERD, anxiety, musculoskeletal pain   Co morbidities: Discussed in HPI   Brief History:  51year old male with a history of multivessel CABG, HFrEF, started on peritoneal dialysis presenting with left-sided chest pain which began earlier today.  He reports an episode of hypotension however has been normotensive here in the ER.  His lung sounds are clear but he does have some trace edema to his lower extremities.  He also has some reproducible left-sided chest wall pain  EMR reviewed including pt  PMHx, past surgical history and past visits to ER.   See HPI for more details   Lab Tests:  I ordered and independently interpreted labs.  The pertinent results include:    Labs notable for CMP with a hyponatremia of 130, CO2 of 19, glucose of 203, creatinine consistent with ESRD, alk phos of 146, elevated anion gap of 19 and a CO2 of 19 likely consistent with ESRD.  Lipase is normal.  Magnesium is normal.  BNP of one 398.1 which appears to be more or less at baseline.  Lactic acid is normal.  Initial troponin of thousand 54, repeat of 992.  He has persistent chest pain.  Imaging Studies:  Abnormal findings. I personally reviewed all imaging studies. Imaging notable for Chest x-ray with cardiomegaly with vascular congestion possibly a small effusion.  Agree with radiology read.   Cardiac Monitoring:  The patient was maintained on a cardiac monitor.  I personally viewed and interpreted the cardiac monitored which showed an underlying rhythm of: Prolonged QT, normal sinus rhythm with T wave inversions although does not appear significantly changed from prior.   Medicines ordered:  I ordered medication including heparin drip and nitro Reevaluation of the patient after these medicines showed that the patient stayed the same I have reviewed the patients home medicines and have made adjustments as needed   Critical Interventions: Heparin gtt    Consults:  I requested consultation with Billey Chang MD w/ cardiology  and discussed lab and imaging findings as well as pertinent plan - they recommend: Heparin drip, mission to the hospitalist team, he will come and see and evaluate the patient.    Reevaluation:  After the interventions noted above I re-evaluated patient and found that they have :stayed the same   Social Determinants of Health:   Problem List / ED Course:  51 year old male presenting with left-sided chest pain, dizziness.  Does have been stable, however  troponin of over thousand but slightly downtrending which is elevated from his baseline.  He is having persistent chest pain although somewhat improved after several hours in the ED. Low suspicion for PE/dissection given reassuring vitals/ workup. Given patient is high risk, I consulted cardiology who recommends admission for heparin drip and will see and evaluate the patient.  Consulted Dr. Bridgett Larsson w/ the  hospitalist team for admission.  Dispostion:  After consideration of the diagnostic results and the patients response to treatment, I feel that the patent would benefit from admission.    Final Clinical Impression(s) / ED Diagnoses Final diagnoses:  Chest pain, unspecified type    Rx / DC Orders ED Discharge Orders     None         Garald Balding, PA-C 11-22-21 [id number]    Orpah Greek, MD 11/09/21 [phone]

## 2021-11-08 NOTE — Telephone Encounter (Signed)
Returned the call to the patient's wife. She was calling to discuss how her husband passed. She stated that she received the call when this occurred but was still in shock over the passing of her husband so did not fully understand what had happened.

## 2021-11-08 NOTE — Telephone Encounter (Signed)
Pt wife calling back with an update.

## 2021-11-08 NOTE — Progress Notes (Signed)
  Echocardiogram 2D Echocardiogram has been performed.  Donald [name] 11/04/21, 10:22 AM

## 2021-11-08 NOTE — Interval H&P Note (Signed)
History and Physical Interval Note:  2021-11-14 11:16 AM  [name]  has presented today for surgery, with the diagnosis of nstemi.  The various methods of treatment have been discussed with the patient and family. After consideration of risks, benefits and other options for treatment, the patient has consented to  Procedure(s): LEFT HEART CATH AND CORS/GRAFTS ANGIOGRAPHY (N/A)  RIGHT HEART CATH PERCUTANEOUS CORONARY INTERVENTION  as a surgical intervention.  The patient's history has been reviewed, patient examined, no change in status, stable for surgery.  I have reviewed the patient's chart and labs.  Questions were answered to the patient's satisfaction.    Cath Lab Visit (complete for each Cath Lab visit)  Clinical Evaluation Leading to the Procedure:   ACS: Yes.    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

## 2021-11-08 NOTE — H&P (Signed)
History and Physical    GIBRAN VESELKA QPY:[id number] DOB: February 19, 1971 DOA: 10/19/2021  PCP: Ladell Pier, MD  Patient coming from: Home  Chief Complaint: Chest pain  HPI: Donald [name] is a 51 y.o. male with medical history significant of ESRD on PD, CAD status post CABG, chronic systolic CHF, insulin-dependent diabetes, hypertension, hyperlipidemia, anemia of chronic disease presented to the ED with complaints of chest pain and dizziness.  Vital signs stable.  Labs showing WBC 7.4, hemoglobin 11.0 (stable), platelet count 133k.  Sodium 130, potassium 3.7, chloride 92, bicarb 19, anion gap 19, BUN 131, creatinine 21.10, glucose 203.  Calcium 8.0, albumin 1.9.  Alk phos 146, remainder of LFTs normal.  Lipase normal.  Lactic acid normal.  EKG showing no STEMI.  High-sensitivity troponin 1054 > 992.  BNP 1398.  Magnesium normal.  Blood cultures drawn.  INR 1.3.  Chest x-ray showing cardiomegaly with vascular congestion and possible tiny left effusion. Patient was seen by cardiology and started on IV heparin with plan for LHC in the morning.  Recommended repeating echocardiogram.  Patient states yesterday his blood pressure was running low in the 80s.  He did take Imdur in the morning but did not take Coreg.  He was feeling dizzy throughout the day and was trying to eat salty foods and drink water to bring up his blood pressure.  In the evening, he started experiencing left-sided aching chest pain which prompted him to seek medical attention.  States his chest pain has now resolved.  He is performing peritoneal dialysis at home.  He denies history of exertional chest pain but does report dyspnea on exertion.  Review of Systems:  Review of Systems  All other systems reviewed and are negative.   Past Medical History:  Diagnosis Date   CAD (coronary artery disease)    S/p CABG in 2016 in [place] //cath 2/22: Patent grafts; PDA/PLA branch is occluded beyond graft insertion; severe diffuse  disease in native AV circumflex supplies small amount of myocardium-if refractory angina, PCI could be considered>> adequate therapy for now   Diabetes mellitus without complication ([place])    ESRD on peritoneal dialysis ([place])    dialysis    HFrEF (heart failure with reduced ejection fraction) ([place])    EF 35-40   History of vitrectomy 2014   anterior, left   Hx of heart bypass surgery    Hyperlipidemia    Hypertension     Past Surgical History:  Procedure Laterality Date   AV FISTULA PLACEMENT     CARDIAC SURGERY     COLONOSCOPY WITH PROPOFOL N/A 02/11/2020   Procedure: COLONOSCOPY WITH PROPOFOL;  Surgeon: Toledo, Benay Pike, MD;  Location: [facility] ENDOSCOPY;  Service: Gastroenterology;  Laterality: N/A;   CORONARY ARTERY BYPASS GRAFT  2016   ESOPHAGOGASTRODUODENOSCOPY (EGD) WITH PROPOFOL N/A 01/02/2020   Procedure: ESOPHAGOGASTRODUODENOSCOPY (EGD) WITH PROPOFOL;  Surgeon: Lesly Rubenstein, MD;  Location: [facility] ENDOSCOPY;  Service: Endoscopy;  Laterality: N/A;   INSERTION OF DIALYSIS CATHETER     left great toe amputation     LEFT HEART CATH AND CORS/GRAFTS ANGIOGRAPHY N/A 06/03/2020   Procedure: LEFT HEART CATH AND CORS/GRAFTS ANGIOGRAPHY;  Surgeon: Sherren Mocha, MD;  Location: [facility] [place] [facility];  Service: Cardiovascular;  Laterality: N/A;   VITRECTOMY Left 2014     reports that he has never smoked. He has never used smokeless tobacco. He reports current alcohol use. He reports current drug use. Drug: Marijuana.  Allergies  Allergen Reactions  Other Swelling    Other reaction(s): EGGPLANT Facial swelling   Lisinopril Hives    Family History  Problem Relation Age of Onset   Diabetes Mellitus II Mother    Heart disease Mother    Diabetes Mother    Hypertension Mother    Kidney failure Mother    Diabetes Maternal Grandmother    Hyperlipidemia Maternal Grandmother    Dementia Maternal Grandmother     Prior to Admission medications   Medication Sig Start Date End Date  Taking? Authorizing Provider  aspirin EC 81 MG EC tablet Take 1 tablet (81 mg total) by mouth daily. 07/17/19   Nita Sells, MD  calcitRIOL (ROCALTROL) 0.5 MCG capsule Take 2 mcg by mouth daily. Take 4 tablets (2 mcg) daily.    [provider]  calcium acetate (PHOSLO) 667 MG capsule Take 2,001 mg by mouth See admin instructions. Take 2,001 mg with each meal. 06/30/20   [provider]  carvedilol (COREG) 12.5 MG tablet Take 1 tablet (12.5 mg total) by mouth 2 (two) times daily with a meal. 09/09/20   Donato Heinz, MD  clopidogrel (PLAVIX) 75 MG tablet Take 1 tablet (75 mg total) by mouth daily. 02/25/21   [facility], Tami Lin, PA  Continuous Blood Gluc Sensor ([place]) MISC Use to check blood sugar at least 4 times daily. Change sensor Q 2 wks 06/04/20   Ladell Pier, MD  doxycycline (VIBRAMYCIN) 100 MG capsule Take 1 capsule (100 mg total) by mouth 2 (two) times daily. 09/08/21   Isla Pence, MD  ezetimibe (ZETIA) 10 MG tablet Take 1 tablet (10 mg total) by mouth daily. 02/25/21 07/20/21  Ledora Bottcher, PA  gabapentin (NEURONTIN) 100 MG capsule 1 cap PO every other day 08/09/21   Ladell Pier, MD  gentamicin cream (GARAMYCIN) 0.1 % Apply 1 application topically every morning. Apply to exit site every day    [provider]  HYDROcodone-acetaminophen (NORCO/VICODIN) 5-325 MG tablet Take 1 tablet by mouth every 4 (four) hours as needed. 09/08/21   Isla Pence, MD  insulin lispro (HUMALOG KWIKPEN) 100 UNIT/ML KwikPen Inject 6 Units into the skin 3 (three) times daily. 03/28/21   Ladell Pier, MD  Insulin Pen Needle (TRUEPLUS PEN NEEDLES) 31G X 6 MM MISC Use to inject Novolog TID and Lantus once daily. Total injections in 1 day = 4. 09/13/20   Ladell Pier, MD  Ipratropium-Albuterol (COMBIVENT) 20-100 MCG/ACT AERS respimat Inhale 1 puff into the lungs every 6 (six) hours as needed for wheezing. 08/09/21   Ladell Pier, MD  isosorbide dinitrate (ISORDIL) 30 MG tablet Take 1 tablet (30 mg total) by mouth 2 (two) times daily. Patient taking differently: Take 30 mg by mouth daily. 01/29/21 01/29/22  Kathlen Mody, Scott T, PA-C  LANTUS SOLOSTAR 100 UNIT/ML Solostar Pen ADMINISTER 20 UNITS UNDER THE SKIN DAILY Patient taking differently: Inject 20 Units into the skin at bedtime. 06/21/21   Ladell Pier, MD  mometasone-formoterol (DULERA) 100-5 MCG/ACT AERO Inhale 2 puffs into the lungs 2 (two) times daily. 08/09/21   Ladell Pier, MD  nitroGLYCERIN (NITROSTAT) 0.4 MG SL tablet Place 1 tablet (0.4 mg total) under the tongue every 5 (five) minutes as needed for chest pain. 01/29/21   Richardson Dopp T, PA-C  Peritoneal Dialysis Solutions (DIALYSIS SOLUTION 2.5% LOW-MG/LOW-CA) 394 MOSM/L SOLN dianeal solution As per nephro 07/16/19   Nita Sells, MD  polyethylene glycol powder (GLYCOLAX/MIRALAX) 17 GM/SCOOP powder Take  1 Container by mouth daily as needed for mild constipation.    [provider]  rosuvastatin (CRESTOR) 40 MG tablet Take 40 mg by mouth in the morning. 12/19/19   [provider]  senna (SENOKOT) 8.6 MG tablet Take 2 tablets (17.2 mg total) by mouth daily. 07/16/19   Nita Sells, MD    Physical Exam: Vitals:   11-30-21 0215 11-30-21 0230 11/30/2021 0245 11-30-21 0300  BP: (!) 88/75 94/61 104/68 105/75  Pulse: 78 80 81 81  Resp: 20 (!) 21 18 (!) 21  Temp:      TempSrc:      SpO2: 98% 97% 96% 98%  Weight:      Height:        Physical Exam Vitals reviewed.  Constitutional:      General: He is not in acute distress. HENT:     Head: Normocephalic and atraumatic.  Eyes:     Extraocular Movements: Extraocular movements intact.  Cardiovascular:     Rate and Rhythm: Normal rate and regular rhythm.     Pulses: Normal pulses.  Pulmonary:     Effort: Pulmonary effort is normal. No respiratory distress.     Breath sounds: No wheezing or rales.  Abdominal:      General: Bowel sounds are normal. There is no distension.     Palpations: Abdomen is soft.     Tenderness: There is no abdominal tenderness. There is no guarding.  Musculoskeletal:        General: No swelling or tenderness.     Cervical back: Normal range of motion.  Skin:    General: Skin is warm and dry.  Neurological:     General: No focal deficit present.     Mental Status: He is alert and oriented to person, place, and time.      Labs on Admission: I have personally reviewed following labs and imaging studies  CBC: Recent Labs  Lab 11/07/2021 2114  WBC 7.4  NEUTROABS 5.4  HGB 11.0*  HCT 32.9*  MCV 89.9  PLT 147*   Basic Metabolic Panel: Recent Labs  Lab 10/16/2021 2114  NA 130*  K 3.7  CL 92*  CO2 19*  GLUCOSE 203*  BUN 131*  CREATININE 21.10*  CALCIUM 8.0*  MG 2.0   GFR: Estimated Creatinine Clearance: 5 mL/min (A) (by C-G formula based on SCr of 21.1 mg/dL (H)). Liver Function Tests: Recent Labs  Lab 10/28/2021 2114  AST 16  ALT 26  ALKPHOS 146*  BILITOT 0.8  PROT 6.0*  ALBUMIN 1.9*   Recent Labs  Lab 10/14/2021 2114  LIPASE 30   No results for input(s): "AMMONIA" in the last 168 hours. Coagulation Profile: Recent Labs  Lab 10/08/2021 2114  INR 1.3*   Cardiac Enzymes: No results for input(s): "CKTOTAL", "CKMB", "CKMBINDEX", "TROPONINI" in the last 168 hours. BNP (last 3 results) No results for input(s): "PROBNP" in the last 8760 hours. HbA1C: No results for input(s): "HGBA1C" in the last 72 hours. CBG: No results for input(s): "GLUCAP" in the last 168 hours. Lipid Profile: No results for input(s): "CHOL", "HDL", "LDLCALC", "TRIG", "CHOLHDL", "LDLDIRECT" in the last 72 hours. Thyroid Function Tests: No results for input(s): "TSH", "T4TOTAL", "FREET4", "T3FREE", "THYROIDAB" in the last 72 hours. Anemia Panel: No results for input(s): "VITAMINB12", "FOLATE", "FERRITIN", "TIBC", "IRON", "RETICCTPCT" in the last 72 hours. Urine analysis: No  results found for: "COLORURINE", "APPEARANCEUR", "LABSPEC", "PHURINE", "GLUCOSEU", "HGBUR", "BILIRUBINUR", "KETONESUR", "PROTEINUR", "UROBILINOGEN", "NITRITE", "LEUKOCYTESUR"  Radiological Exams on Admission: I  have personally reviewed images DG Chest 2 View  Result Date: 10/15/2021 CLINICAL DATA:  Chest pain EXAM: CHEST - 2 VIEW COMPARISON:  07/20/2021 FINDINGS: Post sternotomy changes. Cardiomegaly with vascular congestion. Possible tiny left effusion. Vascular stent in the left axillary region. No pneumothorax. IMPRESSION: Cardiomegaly with vascular congestion and possible tiny left effusion Electronically Signed   By: Donavan Foil M.D.   On: 10/27/2021 21:37    EKG: Independently reviewed.  Sinus rhythm, ST/T wave abnormality in inferolateral leads seen on prior tracing from April 2023 as well.  Assessment and Plan  NSTEMI History of CAD status post CABG EKG showing no STEMI.  High-sensitivity troponin 1054 > 992.  Cardiology recommended starting IV heparin due to concern for NSTEMI, planning on left heart catheterization in the morning.  Patient is currently chest pain-free. -Keep n.p.o. -Continue IV heparin -Echocardiogram ordered -Cardiology recommending continuing home aspirin, Plavix, rosuvastatin, ezetimibe, Coreg, and Imdur.  Resume home meds after pharmacy med rec is done.  Blood pressure soft, Coreg and Imdur might have to be held if blood pressure remains low. -Lipid panel  Acute on chronic systolic CHF Echo done in April 2023 showing EF 40 to 45%.  BNP elevated at 1398. Chest x-ray showing cardiomegaly with vascular congestion and possible tiny left effusion.  Not hypoxic. -Consult nephrology in the morning for dialysis.  ESRD on PD/volume overload Mild metabolic acidosis -Consult nephrology in the morning for dialysis. -Continue home renal supplements after pharmacy med rec is done..  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 -Very sensitive sliding scale insulin every 4 hours -Continue home basal insulin after pharmacy med rec is done. -Continue gabapentin after pharmacy med rec is done.  Anemia of chronic disease Hemoglobin stable. -Continue to monitor  Mild thrombocytopenia Likely due to end-stage renal disease. -Continue to monitor  DVT prophylaxis: IV heparin gtt Code Status: Full Code (discussed with the patient) Family Communication: No family available at this time. Consults called: Cardiology Level of care: Progressive Care Unit Admission status: It is my clinical opinion that referral for OBSERVATION is reasonable and necessary in this patient based on the above information provided. The aforementioned taken together are felt to place the patient at high risk for further clinical deterioration. However, it is anticipated that the patient may be medically stable for discharge from the hospital within 24 to 48 hours.   Shela Leff MD [facility]  If 7PM-7AM, please contact night-coverage [email or web address]  Nov 22, 2021, 3:22 AM

## 2021-11-08 NOTE — Consult Note (Signed)
Cardiology Consultation:   Patient ID: Donald [name] MRN: [id number]; DOB: 1970/11/04  Admit date: 10/10/2021 Date of Consult: 19-Nov-2021  Primary Care Provider: Ladell Pier, MD Primary Cardiologist: Donato Heinz, MD  Primary Electrophysiologist:  None    Patient Profile:   Donald [name] is a 51 y.o. male with a hx of T2DM, HTN, CAD s/p CABG in 2016, ESRD on PD  who is being seen today for the evaluation of chest pain at the request of emergency deparment.  History of Present Illness:   Donald [name] is a 51 yo male with T2DM, HTN, CAD s/p CABG in 2016, ESRD compliant with PD who has been having 1 day of chest pain. Notes that he had lightheadedness and dizziness since early yesterday morning when he woke up which is atypical for him.  Notably, began having chest pain later in the evening.  Describes chest pain as left-sided nonradiating.  He has a cough but no significant shortness of breath and really no other symptoms.  Denies any nausea, vomiting, diarrhea, fevers, chills, or abdominal pain.  On arrival to the emergency department he was hemodynamically stable.  Blood pressures 100s over 70s.  His BUN was 131.  Troponin was checked given chest pain that was 1054 and repeat was 992.  Other labs largely unremarkable.  To note, he had a heart catheterization in 2022, where he was noted to have a patent LIMA to LAD and saphenous vein graft OM1 with no disease in either.  He had a patent vein graft to right PDA with total occlusion of the PDA and PL branches beyond the graft insertion site.  At that time he was found to have moderate LV systolic dysfunction as well with an EF around 40%.  Then, it was favored medical therapy only given total occlusion in the right system.  And the left circumflex supplying a small territory.  He has been undergoing work-up at [facility] for consideration of renal transplant, and had at nuclear medicine stress test on 10/06/2021 that was negative for  ischemia.  Past Medical History:  Diagnosis Date   CAD (coronary artery disease)    S/p CABG in 2016 in [place] //cath 2/22: Patent grafts; PDA/PLA branch is occluded beyond graft insertion; severe diffuse disease in native AV circumflex supplies small amount of myocardium-if refractory angina, PCI could be considered>> adequate therapy for now   Diabetes mellitus without complication ([place])    ESRD on peritoneal dialysis ([place])    dialysis    HFrEF (heart failure with reduced ejection fraction) ([place])    EF 35-40   History of vitrectomy 2014   anterior, left   Hx of heart bypass surgery    Hyperlipidemia    Hypertension     Past Surgical History:  Procedure Laterality Date   AV FISTULA PLACEMENT     CARDIAC SURGERY     COLONOSCOPY WITH PROPOFOL N/A 02/11/2020   Procedure: COLONOSCOPY WITH PROPOFOL;  Surgeon: [place], Benay Pike, MD;  Location: [facility] ENDOSCOPY;  Service: Gastroenterology;  Laterality: N/A;   CORONARY ARTERY BYPASS GRAFT  2016   ESOPHAGOGASTRODUODENOSCOPY (EGD) WITH PROPOFOL N/A 01/02/2020   Procedure: ESOPHAGOGASTRODUODENOSCOPY (EGD) WITH PROPOFOL;  Surgeon: Lesly Rubenstein, MD;  Location: [facility] ENDOSCOPY;  Service: Endoscopy;  Laterality: N/A;   INSERTION OF DIALYSIS CATHETER     left great toe amputation     LEFT HEART CATH AND CORS/GRAFTS ANGIOGRAPHY N/A 06/03/2020   Procedure: LEFT HEART CATH AND CORS/GRAFTS ANGIOGRAPHY;  Surgeon: Sherren Mocha,  MD;  Location: [facility];  Service: Cardiovascular;  Laterality: N/A;   VITRECTOMY Left 2014     Home Medications:  Prior to Admission medications   Medication Sig Start Date End Date Taking? Authorizing Provider  aspirin EC 81 MG EC tablet Take 1 tablet (81 mg total) by mouth daily. 07/17/19   Nita Sells, MD  calcitRIOL (ROCALTROL) 0.5 MCG capsule Take 2 mcg by mouth daily. Take 4 tablets (2 mcg) daily.    [provider]  calcium acetate (PHOSLO) 667 MG capsule Take 2,001 mg by mouth See  admin instructions. Take 2,001 mg with each meal. 06/30/20   [provider]  carvedilol (COREG) 12.5 MG tablet Take 1 tablet (12.5 mg total) by mouth 2 (two) times daily with a meal. 09/09/20   Donato Heinz, MD  clopidogrel (PLAVIX) 75 MG tablet Take 1 tablet (75 mg total) by mouth daily. 02/25/21   [facility], Tami Lin, PA  Continuous Blood Gluc Sensor ([place]) MISC Use to check blood sugar at least 4 times daily. Change sensor Q 2 wks 06/04/20   Ladell Pier, MD  doxycycline (VIBRAMYCIN) 100 MG capsule Take 1 capsule (100 mg total) by mouth 2 (two) times daily. 09/08/21   Isla Pence, MD  ezetimibe (ZETIA) 10 MG tablet Take 1 tablet (10 mg total) by mouth daily. 02/25/21 07/20/21  Ledora Bottcher, PA  gabapentin (NEURONTIN) 100 MG capsule 1 cap PO every other day 08/09/21   Ladell Pier, MD  gentamicin cream (GARAMYCIN) 0.1 % Apply 1 application topically every morning. Apply to exit site every day    [provider]  HYDROcodone-acetaminophen (NORCO/VICODIN) 5-325 MG tablet Take 1 tablet by mouth every 4 (four) hours as needed. 09/08/21   Isla Pence, MD  insulin lispro (HUMALOG KWIKPEN) 100 UNIT/ML KwikPen Inject 6 Units into the skin 3 (three) times daily. 03/28/21   Ladell Pier, MD  Insulin Pen Needle (TRUEPLUS PEN NEEDLES) 31G X 6 MM MISC Use to inject Novolog TID and Lantus once daily. Total injections in 1 day = 4. 09/13/20   Ladell Pier, MD  Ipratropium-Albuterol (COMBIVENT) 20-100 MCG/ACT AERS respimat Inhale 1 puff into the lungs every 6 (six) hours as needed for wheezing. 08/09/21   Ladell Pier, MD  isosorbide dinitrate (ISORDIL) 30 MG tablet Take 1 tablet (30 mg total) by mouth 2 (two) times daily. Patient taking differently: Take 30 mg by mouth daily. 01/29/21 01/29/22  Kathlen Mody, Scott T, PA-C  LANTUS SOLOSTAR 100 UNIT/ML Solostar Pen ADMINISTER 20 UNITS UNDER THE SKIN DAILY Patient taking differently:  Inject 20 Units into the skin at bedtime. 06/21/21   Ladell Pier, MD  mometasone-formoterol (DULERA) 100-5 MCG/ACT AERO Inhale 2 puffs into the lungs 2 (two) times daily. 08/09/21   Ladell Pier, MD  nitroGLYCERIN (NITROSTAT) 0.4 MG SL tablet Place 1 tablet (0.4 mg total) under the tongue every 5 (five) minutes as needed for chest pain. 01/29/21   Richardson Dopp T, PA-C  Peritoneal Dialysis Solutions (DIALYSIS SOLUTION 2.5% LOW-MG/LOW-CA) 394 MOSM/L SOLN dianeal solution As per nephro 07/16/19   Nita Sells, MD  polyethylene glycol powder (GLYCOLAX/MIRALAX) 17 GM/SCOOP powder Take 1 Container by mouth daily as needed for mild constipation.    [provider]  rosuvastatin (CRESTOR) 40 MG tablet Take 40 mg by mouth in the morning. 12/19/19   [provider]  senna (SENOKOT) 8.6 MG tablet Take 2 tablets (17.2 mg total) by mouth  daily. 07/16/19   Nita Sells, MD    Inpatient Medications: Scheduled Meds:  Continuous Infusions:  PRN Meds: nitroGLYCERIN  Allergies:    Allergies  Allergen Reactions   Other Swelling    Other reaction(s): EGGPLANT Facial swelling   Lisinopril Hives    Social History:   Social History   Socioeconomic History   Marital status: Married    Spouse name: Not on file   Number of children: 6   Years of education: Not on file   Highest education level: Some college, no degree  Occupational History   Occupation: disabled  Tobacco Use   Smoking status: Never   Smokeless tobacco: Never  Vaping Use   Vaping Use: Never used  Substance and Sexual Activity   Alcohol use: Yes    Comment: occasionally   Drug use: Yes    Types: Marijuana    Comment: occasionally   Sexual activity: Not on file  Other Topics Concern   Not on file  Social History Narrative   Not on file   Social Determinants of Health   Financial Resource Strain: Low Risk  (09/13/2020)   Overall Financial Resource Strain (CARDIA)    Difficulty of  Paying Living Expenses: Not very hard  Food Insecurity: No Food Insecurity (09/13/2020)   Hunger Vital Sign    Worried About Running Out of Food in the Last Year: Never true    Ran Out of Food in the Last Year: Never true  Transportation Needs: No Transportation Needs (09/13/2020)   PRAPARE - Hydrologist (Medical): No    Lack of Transportation (Non-Medical): No  Physical Activity: Insufficiently Active (09/13/2020)   Exercise Vital Sign    Days of Exercise per Week: 3 days    Minutes of Exercise per Session: 30 min  Stress: No Stress Concern Present (09/13/2020)   [place]    Feeling of Stress : Only a little  Social Connections: Unknown (09/13/2020)   Social Connection and Isolation Panel [NHANES]    Frequency of Communication with Friends and Family: Three times a week    Frequency of Social Gatherings with Friends and Family: Three times a week    Attends Religious Services: Not on file    Active Member of Clubs or Organizations: Not on file    Attends Club or Organization Meetings: Not on file    Marital Status: Not on file  Intimate Partner Violence: Not At Risk (09/13/2020)   Humiliation, Afraid, Rape, and Kick questionnaire    Fear of Current or Ex-Partner: No    Emotionally Abused: No    Physically Abused: No    Sexually Abused: No    Family History:    Family History  Problem Relation Age of Onset   Diabetes Mellitus II Mother    Heart disease Mother    Diabetes Mother    Hypertension Mother    Kidney failure Mother    Diabetes Maternal Grandmother    Hyperlipidemia Maternal Grandmother    Dementia Maternal Grandmother      Review of Systems: 12 point review of systems negative unless otherwise noted in the HPI  Physical Exam/Data:   Vitals:   November 21, 2021 0045 11-21-21 0100 11-21-21 0115 2021/11/21 0130  BP: 107/72 105/69 114/74 104/72  Pulse: 81 81 83 82  Resp: 18  17 12    Temp:      TempSrc:      SpO2: 96% 95% 95% 97%  Weight:      Height:       No intake or output data in the 24 hours ending 2021/11/15 0144 Filed Weights   10/29/2021 2053  Weight: 98.9 kg   Body mass index is 31.28 kg/m.  General:   in no acute distress HEENT: normal Neck: JVP elevated Vascular:FA pulses 2+ bilaterally without bruits  Cardiac:  normal S1, S2; S3 present; RRR; soft systolic murmur Lungs:  clear to auscultation bilaterally, no wheezing, rhonchi or rales  Abd: soft, nontender, no hepatomegaly  Ext: no edema Musculoskeletal:  No deformities, BUE and BLE strength normal and equal Skin: warm and dry  Neuro:  CNs 2-12 intact, no focal abnormalities noted Psych:  Normal affect   EKG:  The EKG was personally reviewed and demonstrates:  nonspecifc st t wave changes Telemetry:  Telemetry was personally reviewed and demonstrates:  nsr  Relevant CV Studies: Echo 07/2021:  1. Left ventricular ejection fraction, by estimation, is 40 to 45%. The  left ventricle has mildly decreased function. The left ventricle  demonstrates global hypokinesis. The left ventricular internal cavity size  was mildly dilated. There is mild  concentric left ventricular hypertrophy. Left ventricular diastolic  function could not be evaluated.   2. Right ventricular systolic function is moderately reduced. The right  ventricular size is not well visualized. There is mildly elevated  pulmonary artery systolic pressure.   3. Left atrial size was mildly dilated.   4. The mitral valve is abnormal. Mild mitral valve regurgitation. There  is moderate holosystolic prolapse of multiple scallops of the posterior  leaflet of the mitral valve.   5. The aortic valve is tricuspid. There is moderate calcification of the  aortic valve. There is moderate thickening of the aortic valve. Aortic  valve regurgitation is not visualized. Aortic valve sclerosis is present,  with no evidence of aortic valve  stenosis.    6. The inferior vena cava is dilated in size with <50% respiratory  variability, suggesting right atrial pressure of 15 mmHg.   Laboratory Data:  Chemistry Recent Labs  Lab 11/04/2021 2114  NA 130*  K 3.7  CL 92*  CO2 19*  GLUCOSE 203*  BUN 131*  CREATININE 21.10*  CALCIUM 8.0*  GFRNONAA 2*  ANIONGAP 19*    Recent Labs  Lab 10/14/2021 2114  PROT 6.0*  ALBUMIN 1.9*  AST 16  ALT 26  ALKPHOS 146*  BILITOT 0.8   Hematology Recent Labs  Lab 11/03/2021 2114  WBC 7.4  RBC 3.66*  HGB 11.0*  HCT 32.9*  MCV 89.9  MCH 30.1  MCHC 33.4  RDW 18.4*  PLT 133*   Cardiac EnzymesNo results for input(s): "TROPONINI" in the last 168 hours. No results for input(s): "TROPIPOC" in the last 168 hours.  BNP Recent Labs  Lab 10/17/2021 2114  BNP 1,398.1*    DDimer No results for input(s): "DDIMER" in the last 168 hours.  Radiology/Studies:  DG Chest 2 View  Result Date: 10/12/2021 CLINICAL DATA:  Chest pain EXAM: CHEST - 2 VIEW COMPARISON:  07/20/2021 FINDINGS: Post sternotomy changes. Cardiomegaly with vascular congestion. Possible tiny left effusion. Vascular stent in the left axillary region. No pneumothorax. IMPRESSION: Cardiomegaly with vascular congestion and possible tiny left effusion Electronically Signed   By: Donavan Foil M.D.   On: 10/21/2021 21:37    Assessment and Plan:   #NSTEMI Given his new development of symptoms, chest pain, and markedly elevated troponin suspect that this is coronary ischemia driving all of  these findings.  However the challenge in his case is his known complex and severe disease that was felt to be best suited for medical treatment on a catheterization last year.  Think it is reasonable and warranted to look and make sure that no vein grafts have severe disease or down.  But if not, wonder if he should continue on medical therapy.  Plan for left heart catheterization tomorrow.  Repeat echo tomorrow as well.  I would start him on anticoagulation with  heparin.  -Continue aspirin 81 mg, clopidogrel 75 mg, rosuvastatin 40 mg, and ezetimibe 10 mg; repeat lipid panel today -Continue carvedilol 12.5 mg and isosorbide dinitrate 30 mg -N.p.o. for left heart catheterization -Please order echo for tomorrow -Cardiac rehab      For questions or updates, please contact [facility] Please consult [email or web address] for contact info under     Signed, Doyne Keel, MD  Nov 12, 2021 1:44 AM

## 2021-11-08 NOTE — H&P (View-Only) (Signed)
Progress Note  Patient Name: Donald [name] Date of Encounter: 2021/11/13  [facility] Cardiologist: Donald Heinz, MD   Subjective   Sitting up in bed.   Inpatient Medications    Scheduled Meds:  insulin aspart  0-6 Units Subcutaneous Q4H   Continuous Infusions:  heparin 1,100 Units/hr (11-13-2021 0244)   PRN Meds: acetaminophen **OR** acetaminophen, nitroGLYCERIN   Vital Signs    Vitals:   11/13/21 0430 13-Nov-2021 0445 Nov 13, 2021 0500 Nov 13, 2021 0630  BP: 93/61 103/70 95/65   Pulse: 85 85 81   Resp: (!) 21 19 20    Temp:    98.4 F (36.9 C)  TempSrc:    Oral  SpO2: 98% 99% 99%   Weight:      Height:       No intake or output data in the 24 hours ending Nov 13, 2021 0721    11/03/2021    8:53 PM 09/08/2021   12:14 PM 08/09/2021   11:18 AM  Last 3 Weights  Weight (lbs) 218 lb 235 lb 242 lb  Weight (kg) 98.884 kg 106.595 kg 109.77 kg      Telemetry    Sinus Rhythm - Personally Reviewed  ECG    No new tracing this morning  Physical Exam   GEN: No acute distress.   Neck: No JVD Cardiac: RRR, no murmurs, rubs, or gallops.  Respiratory: Clear to auscultation bilaterally. GI: Soft, nontender, non-distended  MS: No edema; No deformity. Neuro:  Nonfocal  Psych: Normal affect   Labs    High Sensitivity Troponin:   Recent Labs  Lab 10/12/2021 2114 10/16/2021 2333  TROPONINIHS 1,054* 992*     Chemistry Recent Labs  Lab 10/25/2021 2114 2021/11/13 0433  NA 130* 130*  K 3.7 3.5  CL 92* 92*  CO2 19* 18*  GLUCOSE 203* 126*  BUN 131* 130*  CREATININE 21.10* 20.14*  CALCIUM 8.0* 8.1*  MG 2.0  --   PROT 6.0*  --   ALBUMIN 1.9* 1.7*  AST 16  --   ALT 26  --   ALKPHOS 146*  --   BILITOT 0.8  --   GFRNONAA 2* 3*  ANIONGAP 19* 20*    Lipids  Recent Labs  Lab 11-13-21 0433  CHOL 119  TRIG 73  HDL 41  LDLCALC 63  CHOLHDL 2.9    Hematology Recent Labs  Lab 10/18/2021 2114  WBC 7.4  RBC 3.66*  HGB 11.0*  HCT 32.9*  MCV 89.9  MCH 30.1  MCHC  33.4  RDW 18.4*  PLT 133*   Thyroid No results for input(s): "TSH", "FREET4" in the last 168 hours.  BNP Recent Labs  Lab 11/05/2021 2114  BNP 1,398.1*    DDimer No results for input(s): "DDIMER" in the last 168 hours.   Radiology    DG Chest 2 View  Result Date: 10/13/2021 CLINICAL DATA:  Chest pain EXAM: CHEST - 2 VIEW COMPARISON:  07/20/2021 FINDINGS: Post sternotomy changes. Cardiomegaly with vascular congestion. Possible tiny left effusion. Vascular stent in the left axillary region. No pneumothorax. IMPRESSION: Cardiomegaly with vascular congestion and possible tiny left effusion Electronically Signed   By: Donald [name] M.D.   On: 10/23/2021 21:37    Cardiac Studies   Echo 07/2021:  1. Left ventricular ejection fraction, by estimation, is 40 to 45%. The  left ventricle has mildly decreased function. The left ventricle  demonstrates global hypokinesis. The left ventricular internal cavity size  was mildly dilated. There is mild  concentric  left ventricular hypertrophy. Left ventricular diastolic  function could not be evaluated.   2. Right ventricular systolic function is moderately reduced. The right  ventricular size is not well visualized. There is mildly elevated  pulmonary artery systolic pressure.   3. Left atrial size was mildly dilated.   4. The mitral valve is abnormal. Mild mitral valve regurgitation. There  is moderate holosystolic prolapse of multiple scallops of the posterior  leaflet of the mitral valve.   5. The aortic valve is tricuspid. There is moderate calcification of the  aortic valve. There is moderate thickening of the aortic valve. Aortic  valve regurgitation is not visualized. Aortic valve sclerosis is present,  with no evidence of aortic valve  stenosis.   6. The inferior vena cava is dilated in size with <50% respiratory  variability, suggesting right atrial pressure of 15 mmHg.   Lexiscan: 10/07/21  FINAL COMMENTS   Fixed defect in the  inferior and inferoseptal segments. No evidence of ischemia. LVEF 37%    Patient Profile     51 y.o. male with a hx of T2DM, HTN, CAD s/p CABG in 2016, ESRD on PD, HTN, DM who was seen for the evaluation of chest pain at the request of emergency deparment.  Assessment & Plan    NSTEMI CAD s/p CABG (LIMA-LAD, SVG-OM1, SVG-RPDA) -- Presented with chest pain.  High-sensitivity troponin 1054>> 992.  Cath 05/2020 showed patent grafts but disease in circumflex with recommendations to consider PCI patient had refractory angina though this would require extensive stenting to supply a small amount of myocardium. Recent stress test 09/2021 with no ischemia.  We will plan for cardiac catheterization today to reassess anatomy. -- continue ASA, IV heparin, resume statin. Holding BB with low BPs  Shared Decision Making/Informed Consent The risks [stroke (1 in 1000), death (1 in 1000), kidney failure [usually temporary] (1 in 500), bleeding (1 in 200), allergic reaction [possibly serious] (1 in 200)], benefits (diagnostic support and management of coronary artery disease) and alternatives of a cardiac catheterization were discussed in detail with Donald [name] and he is willing to proceed.  ESRD on PD: followed by nephrology -- also following with [facility] for possible renal transplant  Hypertension: blood pressures are actually low on admission. Home medications held. Could be contributing to symptoms?  Hyperlipidemia: resume Crestor 40mg  daily and Zetia   HFrEF: CXR with congestion on admission, BNP 1398 -- volume management per nephrology -- holding home meds in the setting of soft blood pressures   For questions or updates, please contact [place] Please consult [email or web address] for contact info under        Signed, Donald Bellis, NP  11/18/21, 7:21 AM

## 2021-11-08 NOTE — Death Summary Note (Signed)
DEATH SUMMARY   Patient Details  Name: Donald [name] MRN: [id number] DOB: 1970/08/03  Admission/Discharge Information   Admit Date:  11/29/21  Date of Death: Date of Death: 2021/11/30  Time of Death: Time of Death: 21  Length of Stay: 0  Referring Physician: Ladell Pier, MD   Reason(s) for Hospitalization  Non-ST Elevation Myocardial Infarction  Diagnoses  Preliminary cause of death:  Secondary Diagnoses (including complications and co-morbidities):  Principal Problem:   NSTEMI (non-ST elevated myocardial infarction) [facility] Active Problems:   Type 2 diabetes mellitus with peripheral neuropathy ([place])   ESRD on peritoneal dialysis ([place])   Chronic anemia   CAD (coronary artery disease)   Acute on chronic systolic CHF (congestive heart failure) [facility])   Brief Hospital Course (including significant findings, care, treatment, and services provided and events leading to death)  Donald [name] is a 51 y.o. year old male with end-stage renal disease on peritoneal dialysis (pending evaluation for transplant at [facility]), known CAD-CABG with most recent cath in February 2022 showing patent grafts with essentially occluded RCA at the graft insertion.  He had a nonischemic Myoview done recently.  He presented to [facility] emergency room on 11/29/21 with chest pain, ruled in for non-ST elevation myocardial infarction with troponin levels of over thousand.  Plan was to reassess with cardiac catheterization.  Prior to arrival to the catheterization lab, echocardiogram was performed showed a flail posterior mitral leaflet with severe eccentric mitral valve regurgitation.  We therefore moved from simply left heart catheterization with coronaries to right and left heart catheterization with coronary angiography.  Upon arrival to the Cath Lab he was hypotensive with blood pressures in the 80s, but mentating well.  He was initially having chest pain upon arrival but was chest pain-free shortly  after arrival.  Right heart catheterization was performed showing mild pulmonary hypertension but no severe findings.  Coronary angiography revealed progressive worsening of the native left coronary system with a totally occluded circumflex at the previously placed stent.  The vein graft to the circumflex was widely patent and there was retrograde flow seen in the LAD likely from the LIMA graft.[name]  He RCA graft is patent, but basically does not reach any significant vessel.  While attempting to cannulate the subclavian artery, the patient developed ventricular bigeminy.  He also became more agitated.  I was not able to cannulate with a JR4 catheter and exchanged for the IMA catheter.  Roughly at this time the patient started having concerning neurologic signs with possible facial droop and right-sided weakness.  This resolved, but then 2 minutes later again got worse as he had a significant episode of emesis.  At this time critical care and anesthesia as well as code stroke was called.  He was intubated for airway protection, and shortly after this his blood pressures got continually worse.  Had been started on Levophed at the beginning of the case but maintaining relatively stable blood pressures but now his pressures dropped into the 60s.  It was clear that he was developing cardiogenic shock with severe mitral valve regurgitation, despite the fact that his right heart cath numbers did not appear to be all that abnormal.  He was titrated up to roughly 50 of Levophed, despite this he did not have response to his blood pressures and subsequently was given 2 A of bicarbonate and epinephrine.  At this point his pressures did not respond despite having change in rhythm and tachycardia.  He then had no  pulse.  CPR was then initiated with at least 3 more rounds of epinephrine, 300+150 mg of amiodarone.  He also received at least 4 A of bicarb, 2 of calcium.  Prior to calling the code he did receive a bolus of  lidocaine.  Despite all this he never had palpable pulse or reperfusable rhythm.  After discussion with PCCM, neurology and anesthesiology, we decided any further efforts would be futile.  After over 14 minutes of CPR, time of death was 12:57 PM on 12-Nov-2021.  I personally called the patient's wife Donald [name] who unfortunately is a Administrator and was driving in [place].  I gave her the information in the news about her husband's passing.  I then gave her the number for the morgue.  Pertinent Labs and Studies  Significant Diagnostic Studies CARDIAC CATHETERIZATION  Result Date: 2021-11-12   Ost LAD to Prox LAD lesion is 70% stenosed.  Mid LAD lesion is 95% stenosed.   Ost Cx to Prox Cx lesion is 90% stenosed.  Mid Cx to Dist Cx lesion is 80% stenosed.   Prox RCA lesion is 80% stenosed. Mid RCA to Dist RCA lesion is 100% stenosed.   RPDA lesion is 100% stenosed. RPAV lesion is 100% stenosed.   ----------------------------------------   LIMA-LAD graft was not visualized due to inability to cannulate.   SVG-RPDA is normal in caliber- > however the distal RCA segment is occluded.   SVG-OM was injected and is large -> patent OM target.   ---------------------------------------   LV end diastolic pressure is normal.   Hemodynamic findings consistent with mild pulmonary hypertension.   There is severe (4+) mitral regurgitation by echocardiogram, prominent V wave on PCWP waveform Mild to moderate pulmonary hypertension with PCWP of 17 mmHg. Systemic hypertension Resulting in Cardiogenic Shock Progression of Native Left Coronary Artery disease.  Otherwise stable findings with known occlusion of LCx, LAD and RCA with RCA graft feeding a diminutive vessel.  Did not obtain LIMA graft images as the patient developed stroke symptoms That Progressed into Cardiogenic Shock. Patient deceased in the Cath Lab.[name]   ECHOCARDIOGRAM COMPLETE  Result Date: November 12, 2021    ECHOCARDIOGRAM REPORT   Patient Name:   Donald [name] Date of Exam: 11/12/2021 Medical Rec #:  [id number]      Height:       70.0 in Accession #:    [id number]     Weight:       218.0 lb Date of Birth:  11/11/1970      BSA:          2.165 m Patient Age:    29 years       BP:           87/62 mmHg Patient Gender: M              HR:           88 bpm. Exam Location:  Inpatient Procedure: 2D Echo, 3D Echo, Cardiac Doppler and Color Doppler Indications:    NSTEMI 121.4  History:        Patient has prior history of Echocardiogram examinations, most                 recent 07/15/2021. CHF, CAD, Prior CABG; Risk Factors:Diabetes,                 Dyslipidemia and Hypertension.  Sonographer:    Ronny Flurry Sonographer#2:  Darlina Sicilian RDCS Referring Phys: [id number] [place]  1.  Flail posterior mitral valve leaflet with severe, very eccentric mitral valve regurgitation that is anteriorly directed. The posterior mitral leaflet is severely thickened and calcified. On the prior echo there was severe prolapse and probable small flail segment. . The mitral valve is abnormal. Severe mitral valve regurgitation. No evidence of mitral stenosis. The mean mitral valve gradient is 4.0 mmHg with average heart rate of 80 bpm. Severe mitral annular calcification.  2. Left ventricular ejection fraction, by estimation, is 50 to 55%. The left ventricle has low normal function. The left ventricle has no regional wall motion abnormalities. There is mild left ventricular hypertrophy. Left ventricular diastolic parameters are indeterminate.  3. Right ventricular systolic function is severely reduced. The right ventricular size is moderately enlarged. There is mildly elevated pulmonary artery systolic pressure. The estimated right ventricular systolic pressure is 28.3 mmHg.  4. The aortic valve is abnormal. There is moderate calcification of the aortic valve. Aortic valve regurgitation is mild. No aortic stenosis is present.  5. The inferior vena cava is dilated in size with  <50% respiratory variability, suggesting right atrial pressure of 15 mmHg. Conclusion(s)/Recommendation(s): Critical findings reported to Dr. Martinique and acknowledged at 1100 11/12/21. FINDINGS  Left Ventricle: Left ventricular ejection fraction, by estimation, is 50 to 55%. The left ventricle has low normal function. The left ventricle has no regional wall motion abnormalities. The left ventricular internal cavity size was normal in size. There is mild left ventricular hypertrophy. Left ventricular diastolic parameters are indeterminate. Right Ventricle: The right ventricular size is moderately enlarged. No increase in right ventricular wall thickness. Right ventricular systolic function is severely reduced. There is mildly elevated pulmonary artery systolic pressure. The tricuspid regurgitant velocity is 2.33 m/s, and with an assumed right atrial pressure of 15 mmHg, the estimated right ventricular systolic pressure is 15.1 mmHg. Left Atrium: Left atrial size was normal in size. Right Atrium: Right atrial size was normal in size. Pericardium: There is no evidence of pericardial effusion. Mitral Valve: Flail posterior mitral valve leaflet with severe, very eccentric mitral valve regurgitation that is anteriorly directed. The posterior mitral leaflet is severely thickened and calcified. On the prior echo there was severe prolapse and probable small flail segment. The mitral valve is abnormal. Severe mitral annular calcification. Severe mitral valve regurgitation. No evidence of mitral valve stenosis. MV peak gradient, 12.4 mmHg. The mean mitral valve gradient is 4.0 mmHg with average  heart rate of 80 bpm. Tricuspid Valve: The tricuspid valve is normal in structure. Tricuspid valve regurgitation is mild . No evidence of tricuspid stenosis. Aortic Valve: The aortic valve is abnormal. There is moderate calcification of the aortic valve. Aortic valve regurgitation is mild. No aortic stenosis is present. Pulmonic Valve:  The pulmonic valve was normal in structure. Pulmonic valve regurgitation is mild to moderate. No evidence of pulmonic stenosis. Aorta: The aortic root is normal in size and structure. Venous: The inferior vena cava is dilated in size with less than 50% respiratory variability, suggesting right atrial pressure of 15 mmHg. IAS/Shunts: The atrial septum is grossly normal.  LEFT VENTRICLE PLAX 2D LVIDd:         5.60 cm   Diastology LVIDs:         4.50 cm   LV e' medial:    4.78 cm/s LV PW:         1.30 cm   LV E/e' medial:  27.4 LV IVS:        1.20 cm   LV e' lateral:  6.73 cm/s LVOT diam:     2.30 cm   LV E/e' lateral: 19.5 LV SV:         52 LV SV Index:   24 LVOT Area:     4.15 cm                           3D Volume EF:                          3D EF:        43 %                          LV EDV:       267 ml                          LV ESV:       152 ml                          LV SV:        115 ml RIGHT VENTRICLE RV Basal diam:  6.50 cm RV Mid diam:    3.70 cm TAPSE (M-mode): 0.6 cm LEFT ATRIUM             Index        RIGHT ATRIUM           Index LA diam:        4.60 cm 2.12 cm/m   RA Area:     17.00 cm LA Vol (A2C):   78.3 ml 36.16 ml/m  RA Volume:   47.10 ml  21.75 ml/m LA Vol (A4C):   46.0 ml 21.25 ml/m LA Biplane Vol: 65.9 ml 30.44 ml/m  AORTIC VALVE LVOT Vmax:   70.50 cm/s LVOT Vmean:  50.400 cm/s LVOT VTI:    0.126 m  AORTA Ao Root diam: 3.00 cm Ao Asc diam:  2.90 cm MITRAL VALVE                  TRICUSPID VALVE MV Area (PHT): 4.12 cm       TR Peak grad:   21.7 mmHg MV Area VTI:   1.19 cm       TR Vmax:        233.00 cm/s MV Peak grad:  12.4 mmHg MV Mean grad:  4.0 mmHg       SHUNTS MV Vmax:       1.76 m/s       Systemic VTI:  0.13 m MV Vmean:      95.8 cm/s      Systemic Diam: 2.30 cm MV Decel Time: 184 msec MR Peak grad:    59.0 mmHg MR Mean grad:    32.0 mmHg MR Vmax:         384.00 cm/s MR Vmean:        264.0 cm/s MR PISA:         22.68 cm MR PISA Eff ROA: 236 mm MR PISA Radius:  1.90 cm MV E  velocity: 131.00 cm/s MV A velocity: 61.60 cm/s MV E/A ratio:  2.13 Cherlynn Kaiser MD Electronically signed by Cherlynn Kaiser MD Signature Date/Time: 11/22/21/11:00:40 AM    Final    DG Chest 2 View  Result Date: 10/24/2021 CLINICAL DATA:  Chest pain EXAM: CHEST -  2 VIEW COMPARISON:  07/20/2021 FINDINGS: Post sternotomy changes. Cardiomegaly with vascular congestion. Possible tiny left effusion. Vascular stent in the left axillary region. No pneumothorax. IMPRESSION: Cardiomegaly with vascular congestion and possible tiny left effusion Electronically Signed   By: Donavan Foil M.D.   On: 10/16/2021 21:37    Microbiology Recent Results (from the past 240 hour(s))  Culture, blood (routine x 2)     Status: None (Preliminary result)   Collection Time: 10/09/2021  9:14 PM   Specimen: BLOOD  Result Value Ref Range Status   Specimen Description BLOOD RIGHT ANTECUBITAL  Final   Special Requests   Final    BOTTLES DRAWN AEROBIC AND ANAEROBIC Blood Culture adequate volume   Culture   Final    NO GROWTH < 12 HOURS Performed at [facility], [address], [place], [place] [address]    Report Status PENDING  Incomplete  Culture, blood (routine x 2)     Status: None (Preliminary result)   Collection Time: 10/24/2021  9:14 PM   Specimen: BLOOD RIGHT HAND  Result Value Ref Range Status   Specimen Description BLOOD RIGHT HAND  Final   Special Requests AEROBIC BOTTLE ONLY Blood Culture adequate volume  Final   Culture   Final    NO GROWTH < 12 HOURS Performed at [facility], [place] [address] [place], [place] [address]    Report Status PENDING  Incomplete    Lab Basic Metabolic Panel: Recent Labs  Lab 10/31/2021 2114 2021/11/15 0433 11-15-2021 1149 11-15-2021 1232 11-15-2021 1243  NA 130* 130* 132* 129* 129*  K 3.7 3.5 3.6 4.2 4.1  CL 92* 92*  --  98  --   CO2 19* 18*  --   --   --   GLUCOSE 203* 126*  --  122*  --   BUN 131* 130*  --  >130*  --   CREATININE 21.10* 20.14*  --  >18.00*   --   CALCIUM 8.0* 8.1*  --   --   --   MG 2.0  --   --   --   --   PHOS  --  11.6*  --   --   --    Liver Function Tests: Recent Labs  Lab 11/06/2021 2114 11/15/2021 0433  AST 16  --   ALT 26  --   ALKPHOS 146*  --   BILITOT 0.8  --   PROT 6.0*  --   ALBUMIN 1.9* 1.7*   Recent Labs  Lab 10/27/2021 2114  LIPASE 30   No results for input(s): "AMMONIA" in the last 168 hours. CBC: Recent Labs  Lab 10/23/2021 2114 2021/11/15 0433 Nov 15, 2021 1149 11/15/2021 1232 11-15-2021 1243  WBC 7.4 8.3  --   --   --   NEUTROABS 5.4  --   --   --   --   HGB 11.0* 10.6* 9.9* 10.9* 10.9*  HCT 32.9* 31.9* 29.0* 32.0* 32.0*  MCV 89.9 88.1  --   --   --   PLT 133* 136*  --   --   --    Cardiac Enzymes: No results for input(s): "CKTOTAL", "CKMB", "CKMBINDEX", "TROPONINI" in the last 168 hours. Sepsis Labs: Recent Labs  Lab 10/24/2021 2114 11/15/2021 0433  WBC 7.4 8.3  LATICACIDVEN 1.5  --     Procedures/Operations  TTE reviewed above  Right and Left Heart Catheterization reviewed.   Glenetta Hew 11/15/2021, 1:49 PM

## 2021-11-08 NOTE — ED Notes (Addendum)
Pt's Bp is trending down - 88/70 , map 78. MD Ghimire made aware via secure chat. MD instructed to continue monitor, is expected with dialysis pt.

## 2021-11-08 NOTE — Progress Notes (Signed)
Progress Note  Patient Name: Donald [name] Date of Encounter: Nov 08, 2021  [facility] Cardiologist: Donald Heinz, MD   Subjective   Sitting up in bed.   Inpatient Medications    Scheduled Meds:  insulin aspart  0-6 Units Subcutaneous Q4H   Continuous Infusions:  heparin 1,100 Units/hr (2021-11-08 0244)   PRN Meds: acetaminophen **OR** acetaminophen, nitroGLYCERIN   Vital Signs    Vitals:   11-08-2021 0430 11-08-21 0445 11-08-21 0500 11/08/21 0630  BP: 93/61 103/70 95/65   Pulse: 85 85 81   Resp: (!) 21 19 20    Temp:    98.4 F (36.9 C)  TempSrc:    Oral  SpO2: 98% 99% 99%   Weight:      Height:       No intake or output data in the 24 hours ending November 08, 2021 0721    10/23/2021    8:53 PM 09/08/2021   12:14 PM 08/09/2021   11:18 AM  Last 3 Weights  Weight (lbs) 218 lb 235 lb 242 lb  Weight (kg) 98.884 kg 106.595 kg 109.77 kg      Telemetry    Sinus Rhythm - Personally Reviewed  ECG    No new tracing this morning  Physical Exam   GEN: No acute distress.   Neck: No JVD Cardiac: RRR, no murmurs, rubs, or gallops.  Respiratory: Clear to auscultation bilaterally. GI: Soft, nontender, non-distended  MS: No edema; No deformity. Neuro:  Nonfocal  Psych: Normal affect   Labs    High Sensitivity Troponin:   Recent Labs  Lab 10/28/2021 2114 10/30/2021 2333  TROPONINIHS 1,054* 992*     Chemistry Recent Labs  Lab 10/20/2021 2114 2021/11/08 0433  NA 130* 130*  K 3.7 3.5  CL 92* 92*  CO2 19* 18*  GLUCOSE 203* 126*  BUN 131* 130*  CREATININE 21.10* 20.14*  CALCIUM 8.0* 8.1*  MG 2.0  --   PROT 6.0*  --   ALBUMIN 1.9* 1.7*  AST 16  --   ALT 26  --   ALKPHOS 146*  --   BILITOT 0.8  --   GFRNONAA 2* 3*  ANIONGAP 19* 20*    Lipids  Recent Labs  Lab Nov 08, 2021 0433  CHOL 119  TRIG 73  HDL 41  LDLCALC 63  CHOLHDL 2.9    Hematology Recent Labs  Lab 10/12/2021 2114  WBC 7.4  RBC 3.66*  HGB 11.0*  HCT 32.9*  MCV 89.9  MCH 30.1  MCHC  33.4  RDW 18.4*  PLT 133*   Thyroid No results for input(s): "TSH", "FREET4" in the last 168 hours.  BNP Recent Labs  Lab 10/26/2021 2114  BNP 1,398.1*    DDimer No results for input(s): "DDIMER" in the last 168 hours.   Radiology    DG Chest 2 View  Result Date: 10/25/2021 CLINICAL DATA:  Chest pain EXAM: CHEST - 2 VIEW COMPARISON:  07/20/2021 FINDINGS: Post sternotomy changes. Cardiomegaly with vascular congestion. Possible tiny left effusion. Vascular stent in the left axillary region. No pneumothorax. IMPRESSION: Cardiomegaly with vascular congestion and possible tiny left effusion Electronically Signed   By: Donald [name] M.D.   On: 10/13/2021 21:37    Cardiac Studies   Echo 07/2021:  1. Left ventricular ejection fraction, by estimation, is 40 to 45%. The  left ventricle has mildly decreased function. The left ventricle  demonstrates global hypokinesis. The left ventricular internal cavity size  was mildly dilated. There is mild  concentric  left ventricular hypertrophy. Left ventricular diastolic  function could not be evaluated.   2. Right ventricular systolic function is moderately reduced. The right  ventricular size is not well visualized. There is mildly elevated  pulmonary artery systolic pressure.   3. Left atrial size was mildly dilated.   4. The mitral valve is abnormal. Mild mitral valve regurgitation. There  is moderate holosystolic prolapse of multiple scallops of the posterior  leaflet of the mitral valve.   5. The aortic valve is tricuspid. There is moderate calcification of the  aortic valve. There is moderate thickening of the aortic valve. Aortic  valve regurgitation is not visualized. Aortic valve sclerosis is present,  with no evidence of aortic valve  stenosis.   6. The inferior vena cava is dilated in size with <50% respiratory  variability, suggesting right atrial pressure of 15 mmHg.   Lexiscan: 10/07/21  FINAL COMMENTS   Fixed defect in the  inferior and inferoseptal segments. No evidence of ischemia. LVEF 37%    Patient Profile     51 y.o. male with a hx of T2DM, HTN, CAD s/p CABG in 2016, ESRD on PD, HTN, DM who was seen for the evaluation of chest pain at the request of emergency deparment.  Assessment & Plan    NSTEMI CAD s/p CABG (LIMA-LAD, SVG-OM1, SVG-RPDA) -- Presented with chest pain.  High-sensitivity troponin 1054>> 992.  Cath 05/2020 showed patent grafts but disease in circumflex with recommendations to consider PCI patient had refractory angina though this would require extensive stenting to supply a small amount of myocardium. Recent stress test 09/2021 with no ischemia.  We will plan for cardiac catheterization today to reassess anatomy. -- continue ASA, IV heparin, resume statin. Holding BB with low BPs  Shared Decision Making/Informed Consent The risks [stroke (1 in 1000), death (1 in 1000), kidney failure [usually temporary] (1 in 500), bleeding (1 in 200), allergic reaction [possibly serious] (1 in 200)], benefits (diagnostic support and management of coronary artery disease) and alternatives of a cardiac catheterization were discussed in detail with Donald [name] and he is willing to proceed.  ESRD on PD: followed by nephrology -- also following with [facility] for possible renal transplant  Hypertension: blood pressures are actually low on admission. Home medications held. Could be contributing to symptoms?  Hyperlipidemia: resume Crestor 40mg  daily and Zetia   HFrEF: CXR with congestion on admission, BNP 1398 -- volume management per nephrology -- holding home meds in the setting of soft blood pressures   For questions or updates, please contact [place] Please consult [email or web address] for contact info under        Signed, Donald Bellis, NP  Nov 16, 2021, 7:21 AM

## 2021-11-08 NOTE — Progress Notes (Addendum)
ANTICOAGULATION CONSULT NOTE - Initial Consult  Pharmacy Consult for heparin Indication: chest pain/ACS  Allergies  Allergen Reactions   Other Swelling    Other reaction(s): EGGPLANT Facial swelling   Lisinopril Hives    Patient Measurements: Height: 5\' 10"  (177.8 cm) Weight: 98.9 kg (218 lb) IBW/kg (Calculated) : 73 Heparin Dosing Weight: 93 Kg  Vital Signs: Temp: 97.5 F (36.4 C) (07/25 2047) Temp Source: Oral (07/25 2047) BP: 107/71 (07/26 0200) Pulse Rate: 80 (07/26 0145)  Labs: Recent Labs    10/18/2021 2114 10/18/2021 2333  HGB 11.0*  --   HCT 32.9*  --   PLT 133*  --   LABPROT 15.8*  --   INR 1.3*  --   CREATININE 21.10*  --   TROPONINIHS 1,054* 992*    Estimated Creatinine Clearance: 5 mL/min (A) (by C-G formula based on SCr of 21.1 mg/dL (H)).   Medical History: Past Medical History:  Diagnosis Date   CAD (coronary artery disease)    S/p CABG in 2016 in [place] //cath 2/22: Patent grafts; PDA/PLA branch is occluded beyond graft insertion; severe diffuse disease in native AV circumflex supplies small amount of myocardium-if refractory angina, PCI could be considered>> adequate therapy for now   Diabetes mellitus without complication ([place])    ESRD on peritoneal dialysis ([place])    dialysis    HFrEF (heart failure with reduced ejection fraction) ([facility])    EF 35-40   History of vitrectomy 2014   anterior, left   Hx of heart bypass surgery    Hyperlipidemia    Hypertension      Assessment: 51 yo male with significant PMH including ESRD on PD, CAD s/p CABG, hx of GI bleed  presenting with chest pain. On admission Hgb 11, PLT 133, sCr 21. Pharmcy consulted to start start heparin infusion. No anticoagulation reported prior to admission. Awaiting left heart cath   Goal of Therapy:  Heparin level 0.3-0.7 units/ml Monitor platelets by anticoagulation protocol: Yes   Plan:  Give 4000 units bolus x 1 Start heparin infusion at 1100 units/hr Check anti-Xa  level in 8 hours and daily while on heparin Continue to monitor H&H and platelets  Donald [name] L Donald [name] November 16, 2021,2:14 AM

## 2021-11-08 NOTE — Progress Notes (Signed)
Code stroke was activated due to the patient developing right-sided weakness intraprocedurally.  Briefly he was admitted with NSTEMI, undergoing cardiac cath for evaluation and management.    On my arrival, the patient had developed significant dysarthria and my evaluation was abbreviated.  I was able to appreciate that he had a completely flaccid right upper extremity, able to hold his left upper extremity against drift, his speech was severely dysarthric, and he appeared to have a left hemianopia.  He was able to count fingers on the right.  Given the presence of a left hemianopia and right hemiparesis, this is likely multifocal emboli, though a single broken up embolus to the basilar artery could account for both deficits.  Due to his declining status, the decision was made to intubate him and he became unstable, subsequently suffering cardiac arrest and expiring.  Roland Rack, MD [facility] [phone]  If 7pm- 7am, please page neurology on call as listed in [place].

## 2021-11-08 NOTE — Progress Notes (Signed)
Code stroke; tech [place] contacted Cath Lab.  Pt has ample access.

## 2021-11-08 DEATH — deceased

## 2021-11-10 ENCOUNTER — Ambulatory Visit: Payer: Medicare Other | Admitting: Internal Medicine

## 2021-11-10 NOTE — Telephone Encounter (Signed)
I called the wife and talk to her for about 1/2  - 3/4 hour on the phone on the evening of 1 August.  Glenetta Hew, MD

## 2021-11-24 ENCOUNTER — Other Ambulatory Visit: Payer: Self-pay | Admitting: Internal Medicine

## 2021-11-24 DIAGNOSIS — Z7689 Persons encountering health services in other specified circumstances: Secondary | ICD-10-CM

## 2022-01-23 ENCOUNTER — Encounter (INDEPENDENT_AMBULATORY_CARE_PROVIDER_SITE_OTHER): Payer: Medicare Other | Admitting: Ophthalmology

## 2022-06-21 IMAGING — DX DG CHEST 2V
2 series · 2 of 2 positions shown · non-contrast
Comparison: July 14, 2019

CLINICAL DATA: Chest pain

EXAM:
CHEST - 2 VIEW

[chest pa]
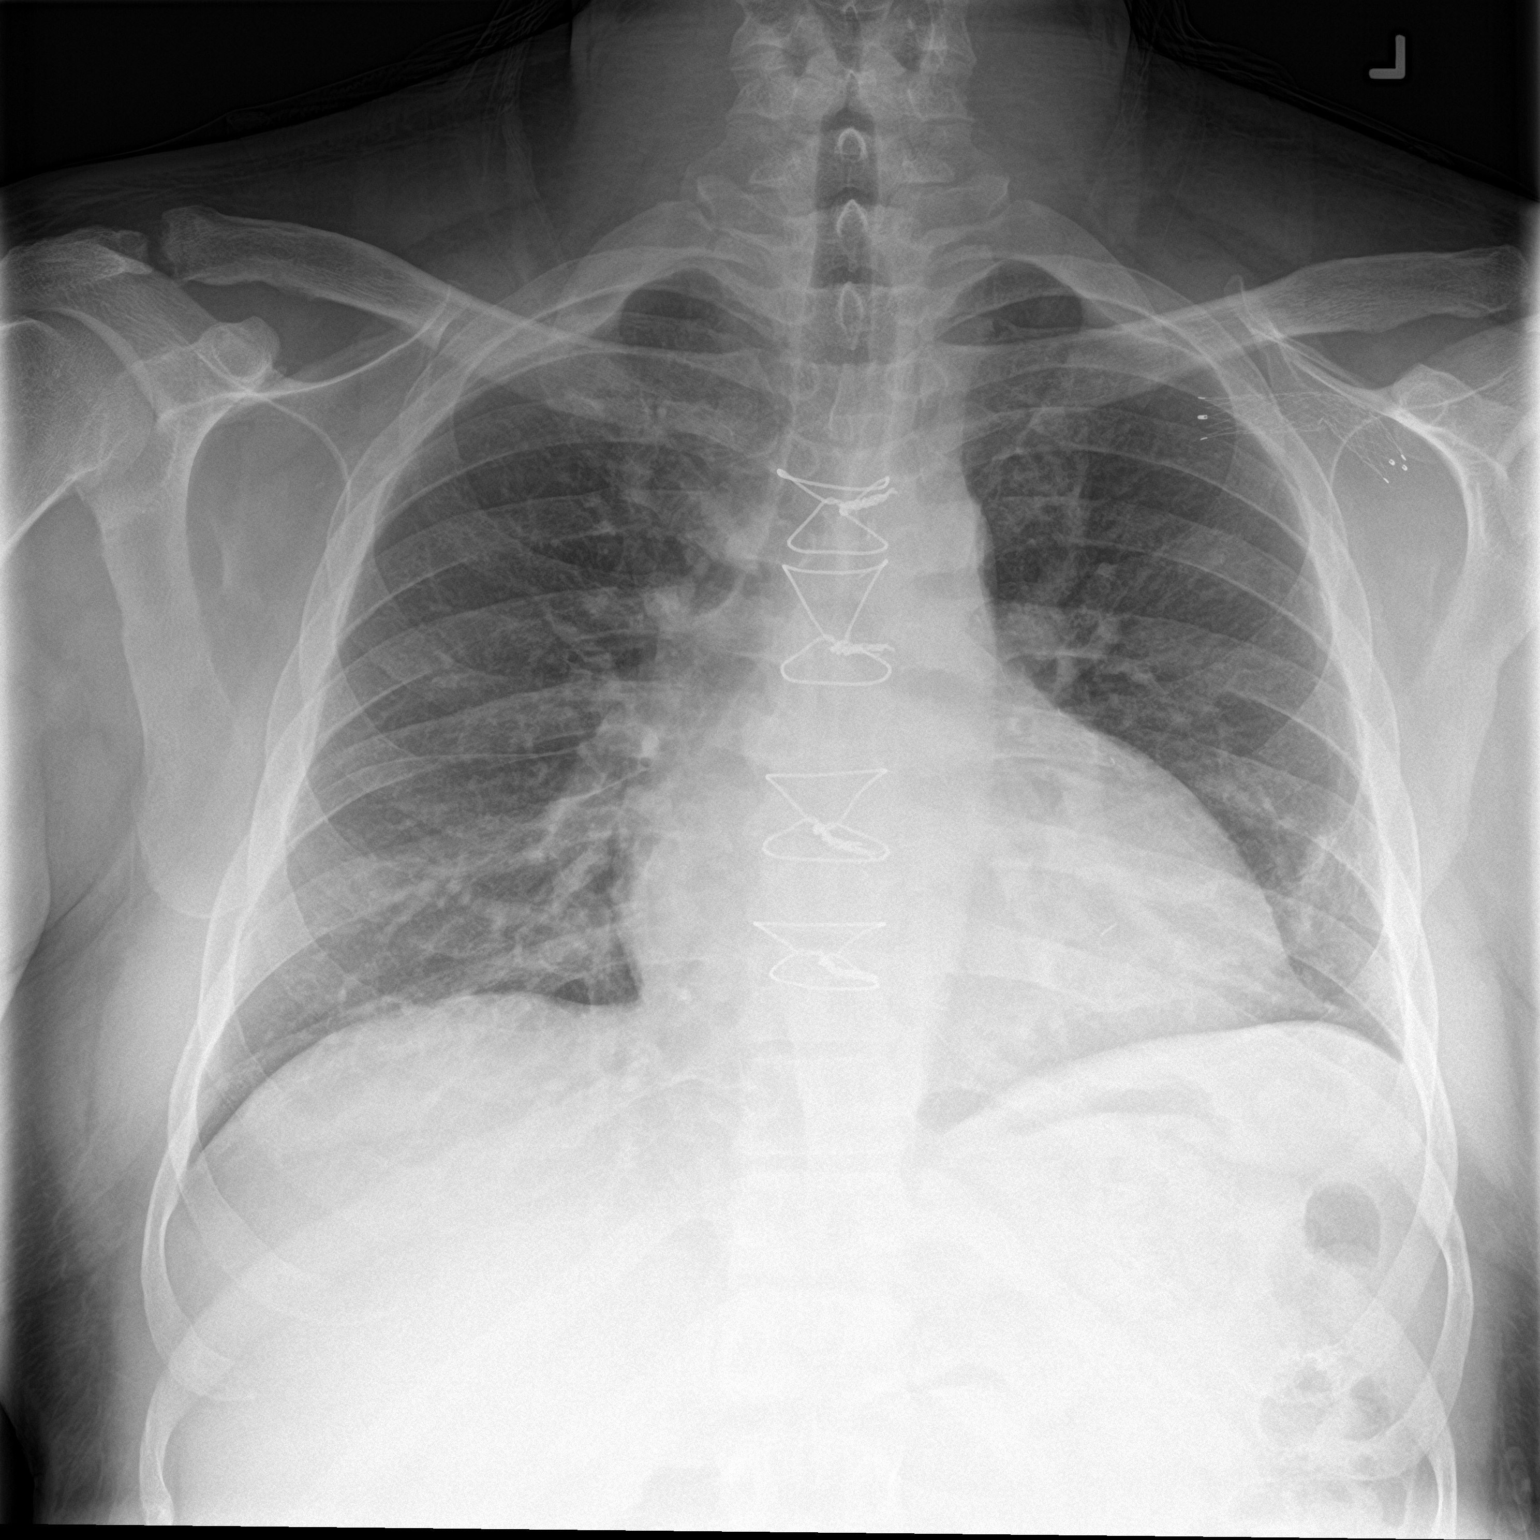

[chest lat]
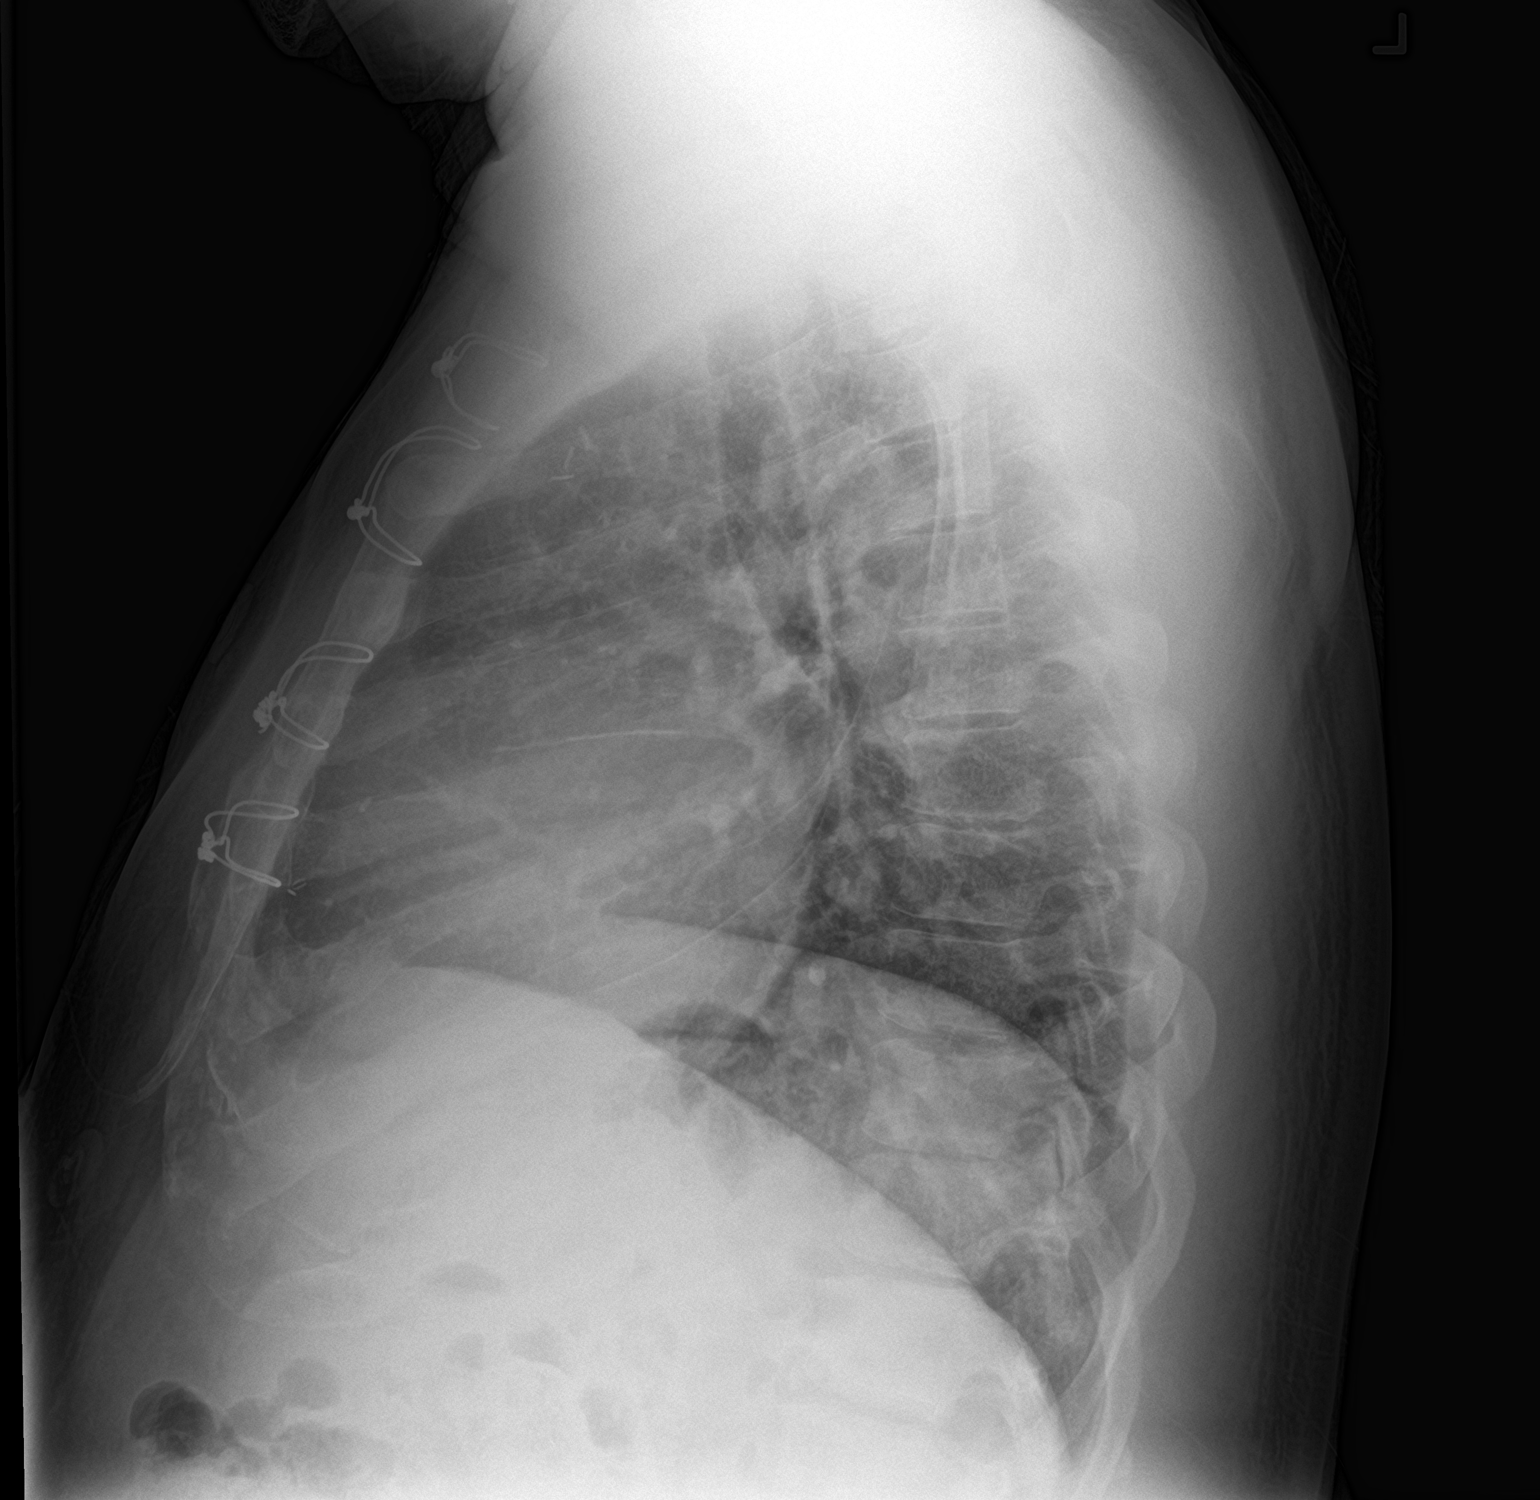

[2 of 2 positions shown; findings below may reference images not displayed]

FINDINGS: There is scarring in the left base. Lungs elsewhere are clear. Heart
is borderline enlarged with pulmonary vascularity normal. Patient is
status post median sternotomy. There is a stent in the left
subclavian-axillary junction region. No bone lesions. No
pneumothorax.
IMPRESSION: Scarring left base. Lungs elsewhere clear. Borderline cardiac
enlargement. Postoperative changes noted.

## 2022-08-19 IMAGING — CR DG CHEST 2V
2 series · 2 of 2 positions shown · non-contrast
Comparison: 05/31/2020

CLINICAL DATA: Chest pain for few days

EXAM:
CHEST - 2 VIEW

[chest pa]
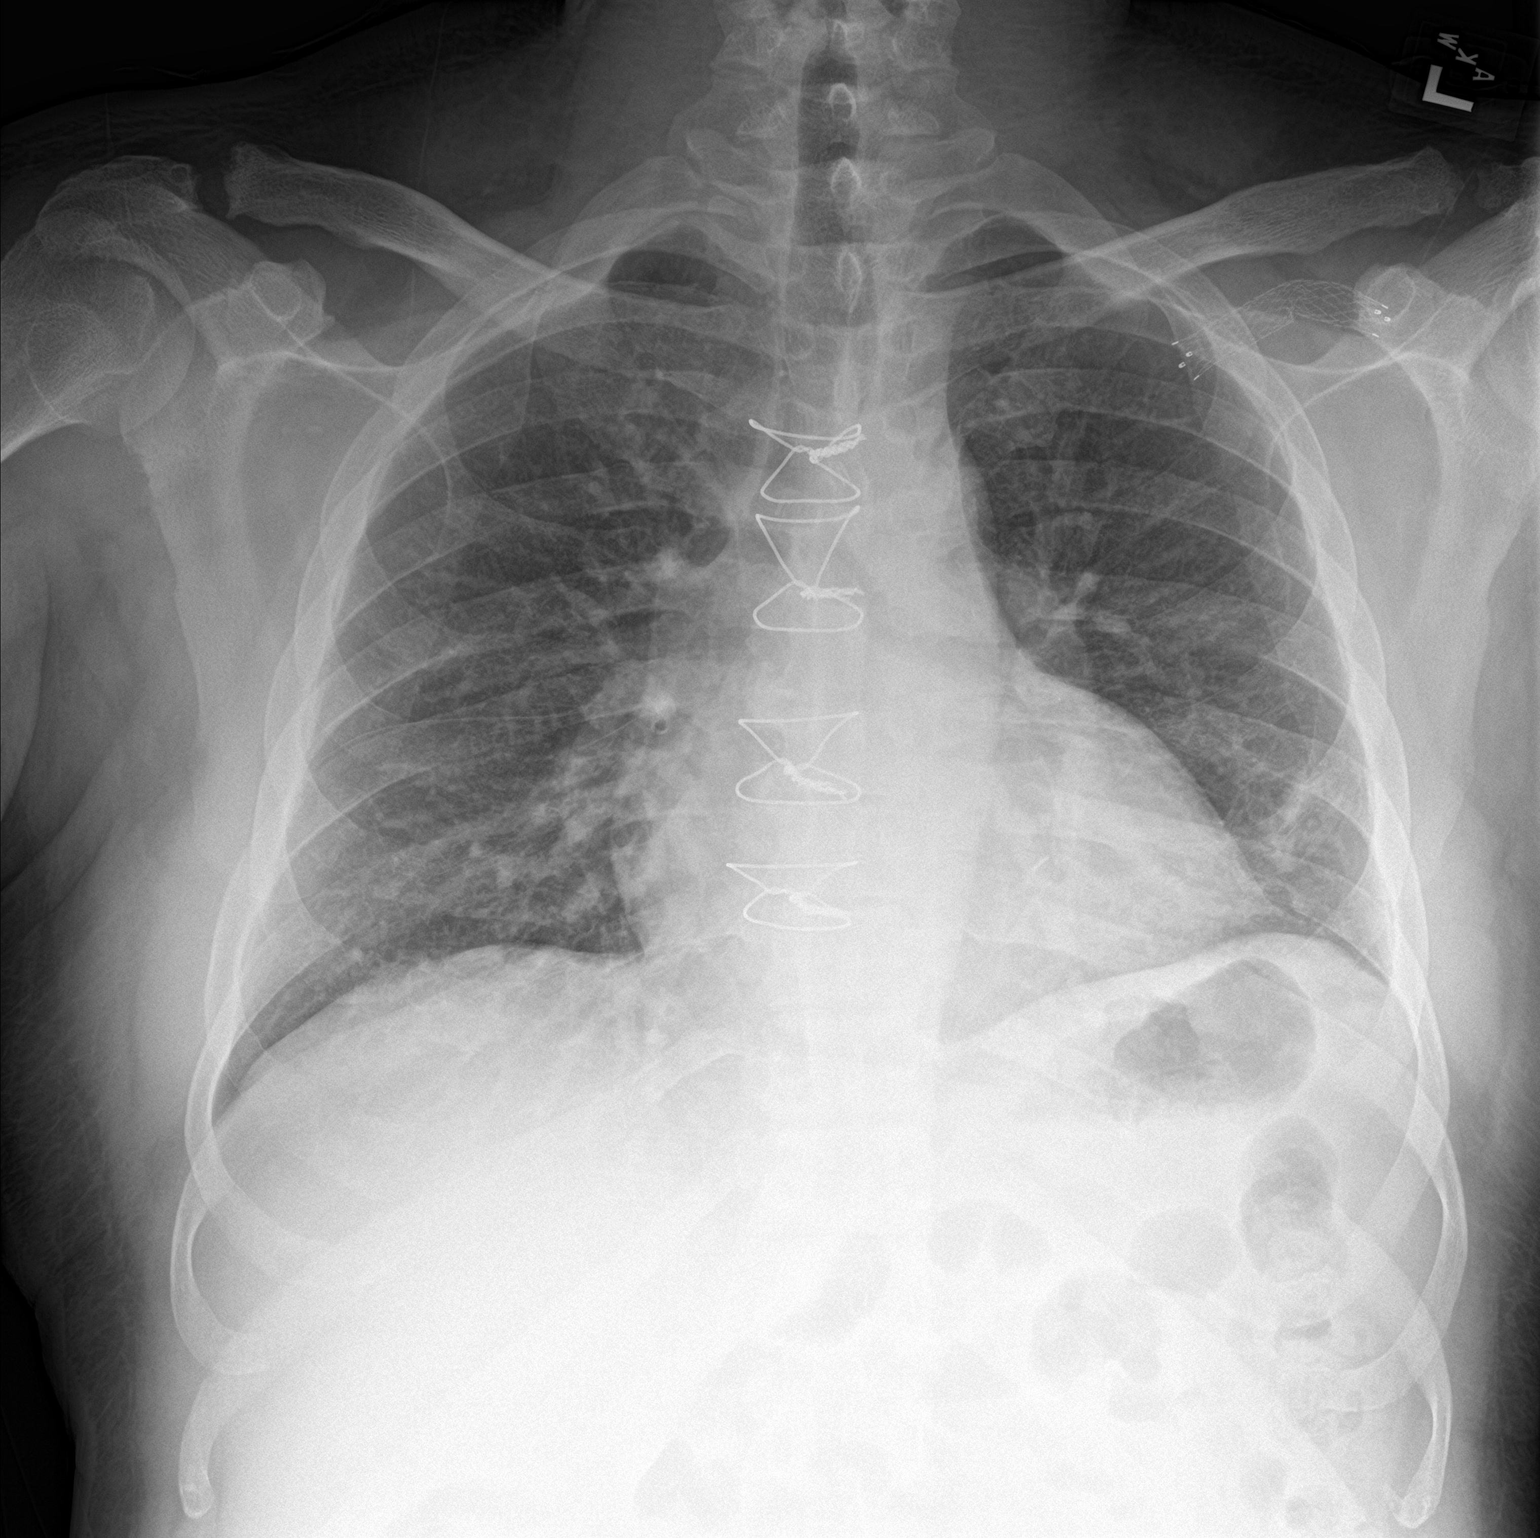

[chest lat]
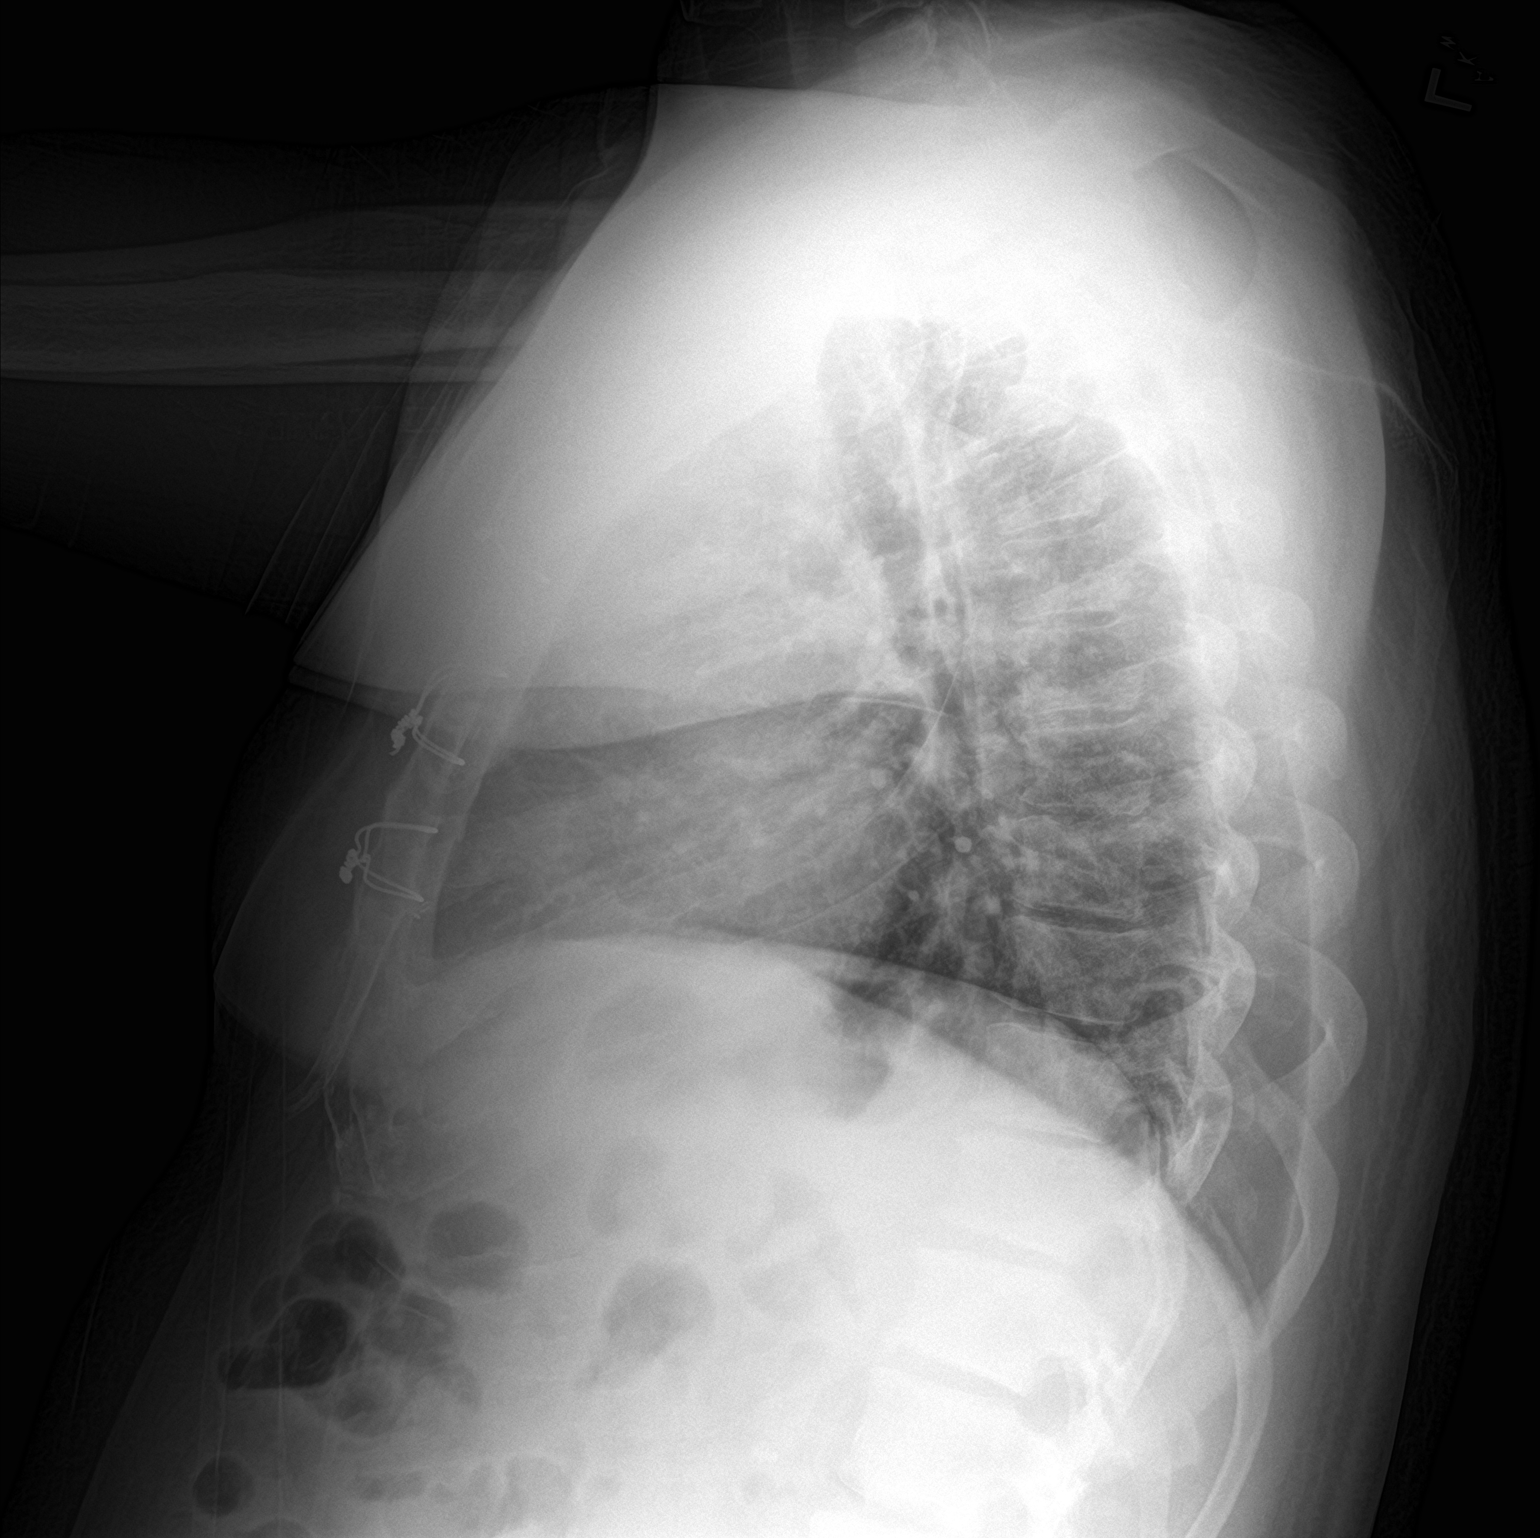

[2 of 2 positions shown; findings below may reference images not displayed]

FINDINGS: Mild cardiomegaly is again noted and stable. Stable scarring is
noted in the left base unchanged from the prior exam. Left
subclavian stent is noted. No acute bony abnormality is seen. No new
focal infiltrate is noted.
IMPRESSION: Left basilar scarring.  No acute abnormality is noted.

## 2023-03-28 IMAGING — CR DG CHEST 2V
2 series · 2 of 2 positions shown · non-contrast
Comparison: Two-view chest x-ray 01/29/2021.

CLINICAL DATA: Cough.  Chills.  Right shoulder pain.

EXAM:
CHEST - 2 VIEW

[chest pa]
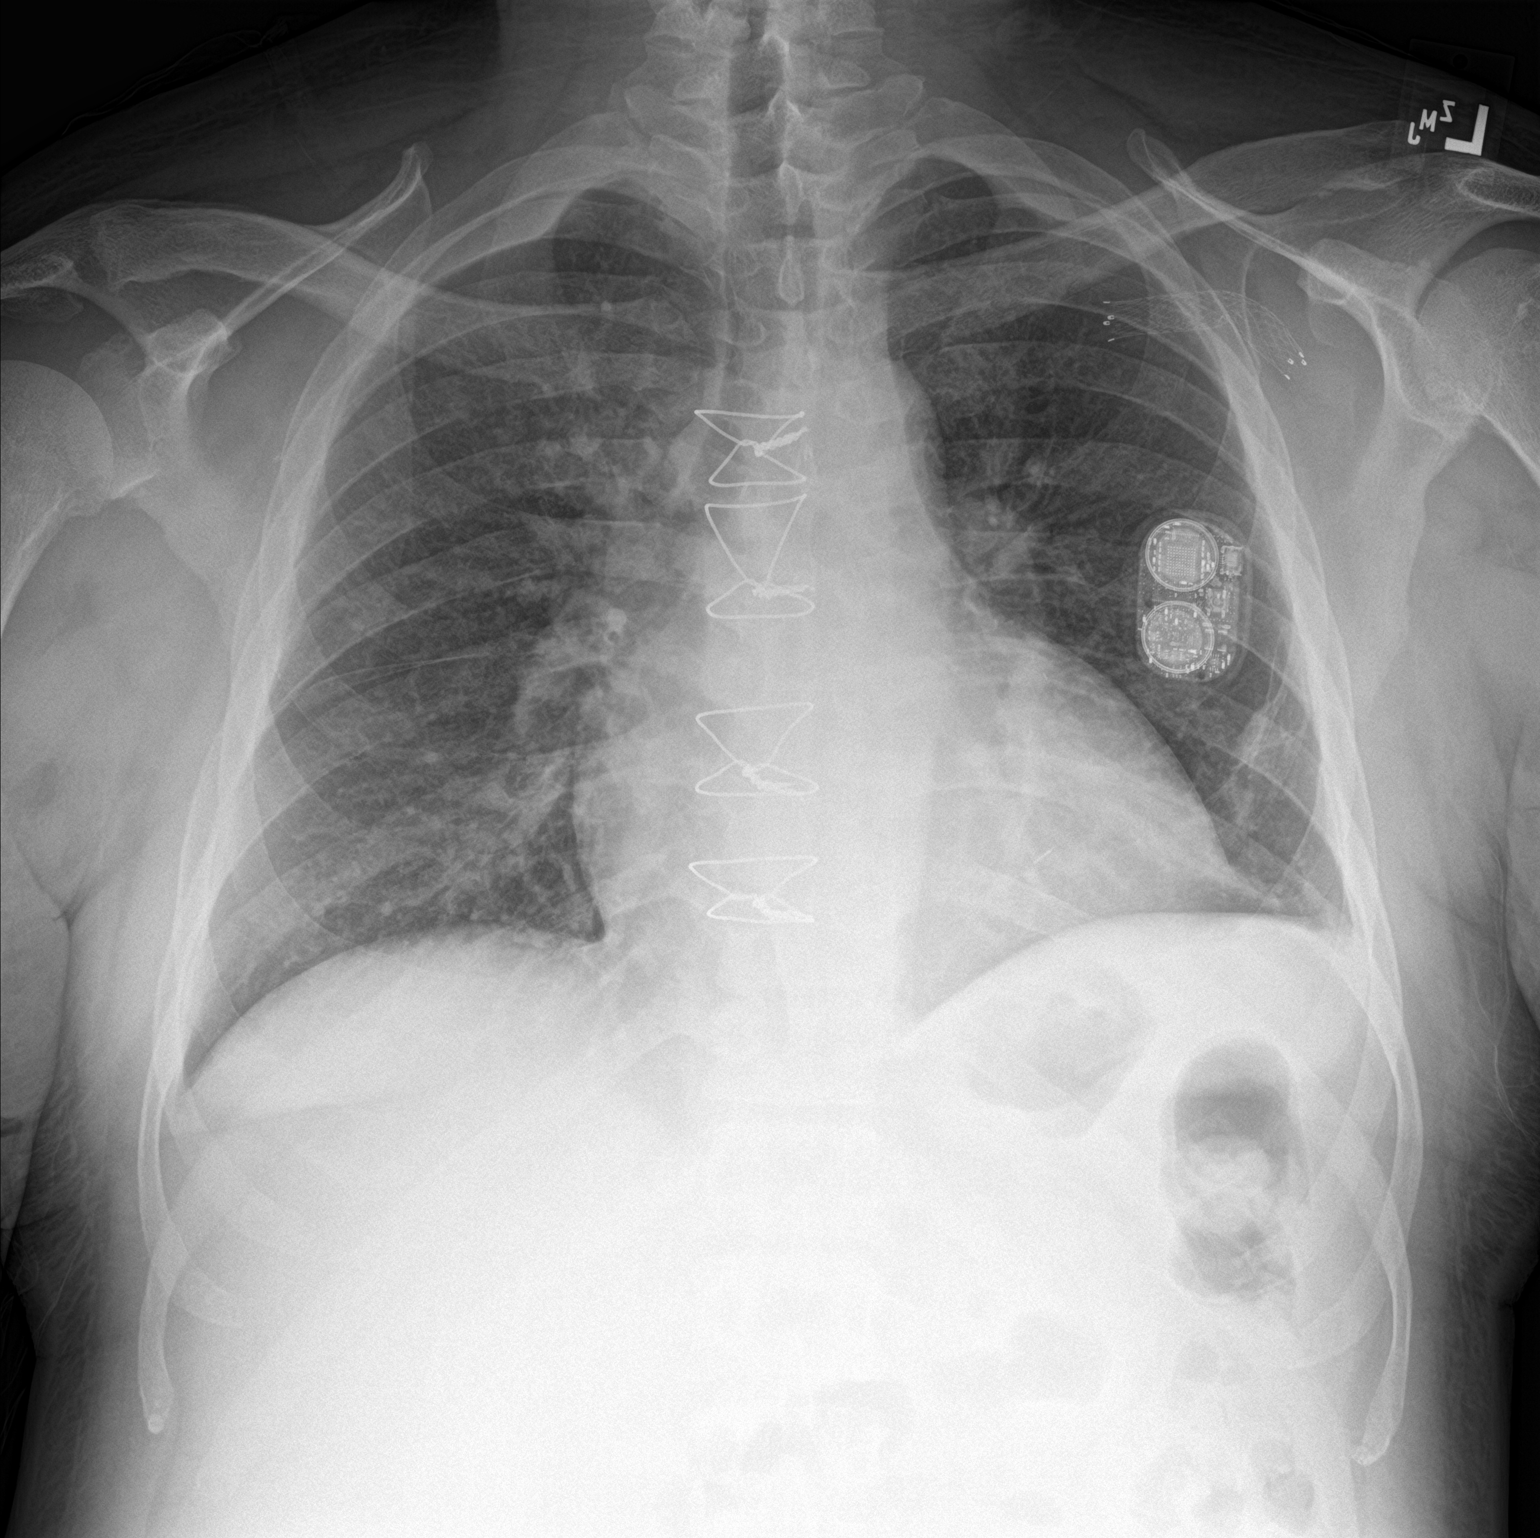

[chest lat]
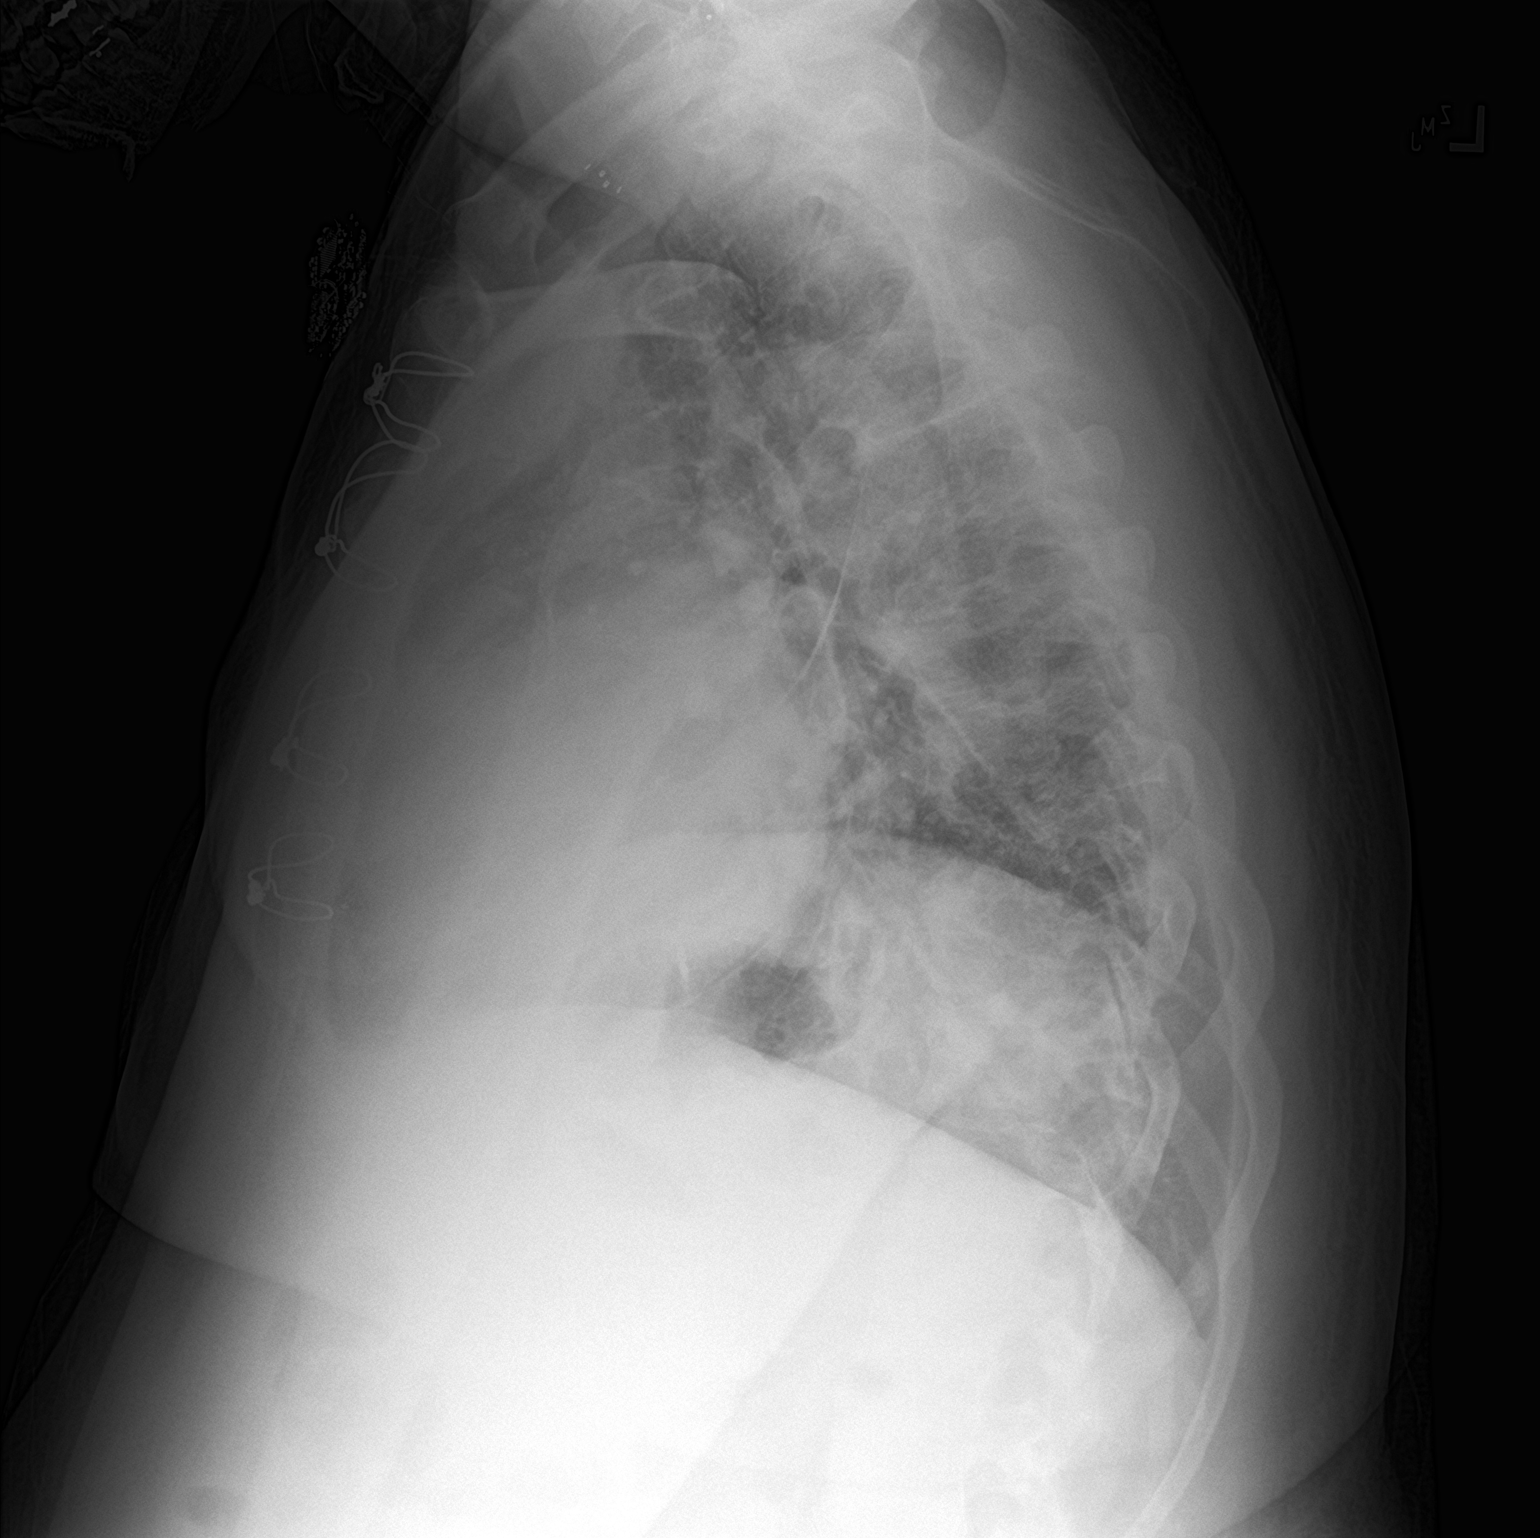

[2 of 2 positions shown; findings below may reference images not displayed]

FINDINGS: Heart is enlarged. Patient is status post median sternotomy for
CABG. Left subclavian stent is in place.

Slight increased in scratched at slight increase in pulmonary
vascular congestion noted. Scarring present at the left base.
Ill-defined medial right upper lobe opacity somewhat more prominent
today. No other focal opacities evident. Axial skeleton within
normal limits.
IMPRESSION: 1. Cardiomegaly with slight increase in pulmonary vascular
congestion.
2. Slight increase in ill-defined medial right upper lobe opacity.
While this could represent atelectasis, early infection or even mass
lesion is not excluded. Recommend CT chest with contrast for further
evaluation.
